# Patient Record
Sex: Male | Born: 1945
Health system: Southern US, Community
[De-identification: ages and names within clinical notes are randomized; demographics above are authoritative.]

## PROBLEM LIST (undated history)

## (undated) DIAGNOSIS — E119 Type 2 diabetes mellitus without complications: Secondary | ICD-10-CM

## (undated) DIAGNOSIS — Z8719 Personal history of other diseases of the digestive system: Secondary | ICD-10-CM

## (undated) DIAGNOSIS — I2510 Atherosclerotic heart disease of native coronary artery without angina pectoris: Secondary | ICD-10-CM

## (undated) DIAGNOSIS — G4700 Insomnia, unspecified: Secondary | ICD-10-CM

## (undated) DIAGNOSIS — F1190 Opioid use, unspecified, uncomplicated: Secondary | ICD-10-CM

## (undated) DIAGNOSIS — J9811 Atelectasis: Secondary | ICD-10-CM

## (undated) DIAGNOSIS — G4730 Sleep apnea, unspecified: Secondary | ICD-10-CM

## (undated) DIAGNOSIS — J449 Chronic obstructive pulmonary disease, unspecified: Secondary | ICD-10-CM

## (undated) DIAGNOSIS — I219 Acute myocardial infarction, unspecified: Secondary | ICD-10-CM

## (undated) DIAGNOSIS — K219 Gastro-esophageal reflux disease without esophagitis: Secondary | ICD-10-CM

## (undated) DIAGNOSIS — I5189 Other ill-defined heart diseases: Secondary | ICD-10-CM

## (undated) DIAGNOSIS — I340 Nonrheumatic mitral (valve) insufficiency: Secondary | ICD-10-CM

## (undated) DIAGNOSIS — E559 Vitamin D deficiency, unspecified: Secondary | ICD-10-CM

## (undated) DIAGNOSIS — Z972 Presence of dental prosthetic device (complete) (partial): Secondary | ICD-10-CM

## (undated) DIAGNOSIS — E876 Hypokalemia: Secondary | ICD-10-CM

## (undated) DIAGNOSIS — M5136 Other intervertebral disc degeneration, lumbar region: Secondary | ICD-10-CM

## (undated) DIAGNOSIS — M1990 Unspecified osteoarthritis, unspecified site: Secondary | ICD-10-CM

## (undated) DIAGNOSIS — I10 Essential (primary) hypertension: Secondary | ICD-10-CM

## (undated) DIAGNOSIS — E781 Pure hyperglyceridemia: Secondary | ICD-10-CM

## (undated) DIAGNOSIS — M51369 Other intervertebral disc degeneration, lumbar region without mention of lumbar back pain or lower extremity pain: Secondary | ICD-10-CM

## (undated) DIAGNOSIS — F419 Anxiety disorder, unspecified: Secondary | ICD-10-CM

## (undated) HISTORY — DX: Personal history of other diseases of the digestive system: Z87.19

## (undated) HISTORY — DX: Vitamin D deficiency, unspecified: E55.9

## (undated) HISTORY — DX: Pure hyperglyceridemia: E78.1

## (undated) HISTORY — DX: Insomnia, unspecified: G47.00

## (undated) HISTORY — DX: Type 2 diabetes mellitus without complications: E11.9

## (undated) HISTORY — DX: Unspecified osteoarthritis, unspecified site: M19.90

## (undated) HISTORY — DX: Opioid use, unspecified, uncomplicated: F11.90

## (undated) HISTORY — DX: Sleep apnea, unspecified: G47.30

## (undated) HISTORY — DX: Nonrheumatic mitral (valve) insufficiency: I34.0

## (undated) HISTORY — PX: HERNIA REPAIR: SHX51

## (undated) HISTORY — DX: Other intervertebral disc degeneration, lumbar region: M51.36

## (undated) HISTORY — DX: Atherosclerotic heart disease of native coronary artery without angina pectoris: I25.10

## (undated) HISTORY — DX: Essential (primary) hypertension: I10

## (undated) HISTORY — DX: Acute myocardial infarction, unspecified: I21.9

## (undated) HISTORY — DX: Anxiety disorder, unspecified: F41.9

## (undated) HISTORY — DX: Atelectasis: J98.11

## (undated) HISTORY — DX: Other ill-defined heart diseases: I51.89

## (undated) HISTORY — DX: Chronic obstructive pulmonary disease, unspecified: J44.9

## (undated) HISTORY — DX: Hypokalemia: E87.6

## (undated) HISTORY — DX: Other intervertebral disc degeneration, lumbar region without mention of lumbar back pain or lower extremity pain: M51.369

## (undated) HISTORY — DX: Gastro-esophageal reflux disease without esophagitis: K21.9

---

## 1995-10-19 HISTORY — PX: CORONARY ARTERY BYPASS GRAFT: SHX141

## 2001-10-18 HISTORY — PX: FOOT SURGERY: SHX648

## 2002-02-28 ENCOUNTER — Encounter: Admission: RE | Admit: 2002-02-28 | Discharge: 2002-02-28 | Payer: Self-pay | Admitting: *Deleted

## 2002-07-25 ENCOUNTER — Encounter: Payer: Self-pay | Admitting: Neurosurgery

## 2002-07-27 ENCOUNTER — Ambulatory Visit (HOSPITAL_COMMUNITY): Admission: RE | Admit: 2002-07-27 | Discharge: 2002-07-27 | Payer: Self-pay | Admitting: Neurosurgery

## 2002-07-27 ENCOUNTER — Encounter: Payer: Self-pay | Admitting: Neurosurgery

## 2005-02-15 ENCOUNTER — Emergency Department: Payer: Self-pay | Admitting: Emergency Medicine

## 2006-11-18 HISTORY — PX: CORONARY STENT PLACEMENT: SHX1402

## 2006-12-09 ENCOUNTER — Other Ambulatory Visit: Payer: Self-pay

## 2006-12-09 ENCOUNTER — Inpatient Hospital Stay: Payer: Self-pay | Admitting: Endocrinology

## 2006-12-10 ENCOUNTER — Other Ambulatory Visit: Payer: Self-pay

## 2011-06-10 DIAGNOSIS — K5792 Diverticulitis of intestine, part unspecified, without perforation or abscess without bleeding: Secondary | ICD-10-CM | POA: Insufficient documentation

## 2011-06-10 DIAGNOSIS — Z86010 Personal history of colonic polyps: Secondary | ICD-10-CM | POA: Insufficient documentation

## 2011-06-10 LAB — HM COLONOSCOPY: HM Colonoscopy: ABNORMAL

## 2012-03-20 ENCOUNTER — Ambulatory Visit: Payer: Self-pay | Admitting: Surgery

## 2012-04-17 HISTORY — PX: UMBILICAL HERNIA REPAIR: SHX196

## 2012-09-26 ENCOUNTER — Ambulatory Visit: Payer: Self-pay | Admitting: Family Medicine

## 2014-05-29 LAB — TSH: TSH: 2.49 u[IU]/mL (ref <=5.90)

## 2014-10-14 LAB — HEMOGLOBIN A1C: HEMOGLOBIN A1C: 5.7 % (ref 4.0–6.0)

## 2014-10-14 LAB — LIPID PANEL
Cholesterol: 126 mg/dL (ref 0–200)
HDL: 28 mg/dL — AB (ref 35–70)
LDL CALC: 53 mg/dL
Triglycerides: 225 mg/dL — AB (ref 40–160)

## 2014-11-12 ENCOUNTER — Ambulatory Visit: Payer: Self-pay | Admitting: Family Medicine

## 2014-11-12 DIAGNOSIS — E119 Type 2 diabetes mellitus without complications: Secondary | ICD-10-CM | POA: Diagnosis not present

## 2014-11-12 DIAGNOSIS — M79671 Pain in right foot: Secondary | ICD-10-CM | POA: Diagnosis not present

## 2014-11-12 DIAGNOSIS — G4700 Insomnia, unspecified: Secondary | ICD-10-CM | POA: Diagnosis not present

## 2014-11-12 DIAGNOSIS — M19071 Primary osteoarthritis, right ankle and foot: Secondary | ICD-10-CM | POA: Diagnosis not present

## 2014-11-12 DIAGNOSIS — R208 Other disturbances of skin sensation: Secondary | ICD-10-CM | POA: Diagnosis not present

## 2014-11-12 DIAGNOSIS — F419 Anxiety disorder, unspecified: Secondary | ICD-10-CM | POA: Diagnosis not present

## 2014-11-12 DIAGNOSIS — M542 Cervicalgia: Secondary | ICD-10-CM | POA: Diagnosis not present

## 2014-11-12 LAB — CBC AND DIFFERENTIAL
HCT: 43 % (ref 41–53)
Hemoglobin: 15 g/dL (ref 13.5–17.5)
Neutrophils Absolute: 4 /uL
Platelets: 179 10*3/uL (ref 150–399)
WBC: 8.7 10^3/mL

## 2014-11-12 LAB — HEPATIC FUNCTION PANEL
ALT: 39 U/L (ref 10–40)
AST: 22 U/L (ref 14–40)
Alkaline Phosphatase: 54 U/L (ref 25–125)
Bilirubin, Total: 0.4 mg/dL

## 2014-11-12 LAB — BASIC METABOLIC PANEL
BUN: 14 mg/dL (ref 4–21)
Creatinine: 1.1 mg/dL (ref 0.6–1.3)
Glucose: 103 mg/dL
Potassium: 4.3 mmol/L (ref 3.4–5.3)
Sodium: 140 mmol/L (ref 137–147)

## 2014-12-12 DIAGNOSIS — M542 Cervicalgia: Secondary | ICD-10-CM | POA: Diagnosis not present

## 2014-12-12 DIAGNOSIS — E784 Other hyperlipidemia: Secondary | ICD-10-CM | POA: Diagnosis not present

## 2014-12-12 DIAGNOSIS — F419 Anxiety disorder, unspecified: Secondary | ICD-10-CM | POA: Diagnosis not present

## 2014-12-12 DIAGNOSIS — I10 Essential (primary) hypertension: Secondary | ICD-10-CM | POA: Diagnosis not present

## 2015-02-26 DIAGNOSIS — H2513 Age-related nuclear cataract, bilateral: Secondary | ICD-10-CM | POA: Diagnosis not present

## 2015-04-01 ENCOUNTER — Other Ambulatory Visit: Payer: Self-pay

## 2015-04-01 MED ORDER — METFORMIN HCL ER 500 MG PO TB24: 500.0000 mg | ORAL_TABLET | Freq: Two times a day (BID) | ORAL | Status: DC

## 2015-04-01 NOTE — Telephone Encounter (Signed)
Requested from patients pharmacy

## 2015-04-11 ENCOUNTER — Ambulatory Visit (INDEPENDENT_AMBULATORY_CARE_PROVIDER_SITE_OTHER): Payer: Medicare Other | Admitting: Family Medicine

## 2015-04-11 ENCOUNTER — Other Ambulatory Visit: Payer: Self-pay | Admitting: Family Medicine

## 2015-04-11 ENCOUNTER — Encounter: Payer: Self-pay | Admitting: Family Medicine

## 2015-04-11 VITALS — BP 118/78 | HR 71 | Temp 98.5°F | Resp 18 | Ht 71.0 in | Wt 218.7 lb

## 2015-04-11 DIAGNOSIS — M5030 Other cervical disc degeneration, unspecified cervical region: Secondary | ICD-10-CM | POA: Insufficient documentation

## 2015-04-11 DIAGNOSIS — M542 Cervicalgia: Secondary | ICD-10-CM

## 2015-04-11 DIAGNOSIS — F419 Anxiety disorder, unspecified: Secondary | ICD-10-CM | POA: Diagnosis not present

## 2015-04-11 DIAGNOSIS — G8929 Other chronic pain: Secondary | ICD-10-CM | POA: Insufficient documentation

## 2015-04-11 DIAGNOSIS — M549 Dorsalgia, unspecified: Secondary | ICD-10-CM | POA: Diagnosis not present

## 2015-04-11 DIAGNOSIS — F1120 Opioid dependence, uncomplicated: Secondary | ICD-10-CM | POA: Insufficient documentation

## 2015-04-11 MED ORDER — CLONAZEPAM 1 MG PO TABS: 1.0000 mg | ORAL_TABLET | Freq: Two times a day (BID) | ORAL | Status: DC | PRN

## 2015-04-11 MED ORDER — OXYCODONE-ACETAMINOPHEN 10-325 MG PO TABS: 1.0000 | ORAL_TABLET | Freq: Four times a day (QID) | ORAL | Status: DC | PRN

## 2015-04-11 NOTE — Progress Notes (Signed)
Name: Juan Le.   MRN: [id number]    DOB: 1946/01/14   Date:04/11/2015       Progress Note  Subjective  Chief Complaint  Chief Complaint  Patient presents with  . Medication Refill  . Hypertension    Neck Pain  This is a chronic problem. Associated with: pt. has histry of degenerative disc disease in his neck. The pain is at a severity of 2/10 (as long as he is taking medication, it is controlled.). The pain is mild. The symptoms are aggravated by position and bending. Pertinent negatives include no headaches, leg pain, numbness or weakness. He has tried oral narcotics for the symptoms. The treatment provided significant relief.  Anxiety Presents for follow-up visit. Symptoms include excessive worry, insomnia and nervous/anxious behavior. Patient reports no feeling of choking, irritability, palpitations or shortness of breath. The severity of symptoms is moderate.   Past treatments include benzodiazephines. The treatment provided significant relief. Compliance with prior treatments has been good.  Back Pain This is a chronic problem. The problem is unchanged. The pain is present in the lumbar spine and thoracic spine. The quality of the pain is described as shooting. The pain does not radiate. The pain is at a severity of 2/10. The pain is moderate (pain is controlled as long as he is on medication). Pertinent negatives include no bladder incontinence, bowel incontinence, headaches, leg pain, numbness or weakness. He has tried analgesics for the symptoms. The treatment provided significant relief.      Past Medical History  Diagnosis Date  . Diabetes   . Hypertriglyceridemia   . Hypokalemia   . Vitamin D deficiency   . Chronic, continuous use of opioids   . Anxiety   . Insomnia   . Hypertension   . GERD (gastroesophageal reflux disease)   . History of diverticulitis   . Mitral regurgitation   . DJD (degenerative joint disease)   . Hypertension     Past Surgical  History  Procedure Laterality Date  . Umbilical hernia repair  04/2012  . Coronary stent placement  11/2006  . Coronary artery bypass graft  1997  . Foot surgery  2003    Family History  Problem Relation Age of Onset  . Throat cancer Father   . Stroke Brother   . Stroke Mother   . Breast cancer Paternal Aunt   . Colon cancer Father   . Kidney disease Mother   . Heart disease Mother     History   Social History  . Marital Status: Married    Spouse Name: N/A  . Number of Children: 0  . Years of Education: N/A   Occupational History  . Not on file.   Social History Main Topics  . Smoking status: Former Research scientist (life sciences)  . Smokeless tobacco: Not on file  . Alcohol Use: No  . Drug Use: No  . Sexual Activity:    Partners: Female   Other Topics Concern  . Not on file   Social History Narrative     Current outpatient prescriptions:  .  aspirin 81 MG tablet, Take 1 tablet by mouth daily., Disp: , Rfl:  .  atorvastatin (LIPITOR) 40 MG tablet, Take 1 tablet by mouth daily., Disp: , Rfl:  .  clonazePAM (KLONOPIN) 1 MG tablet, Take 1 tablet by mouth 2 (two) times daily., Disp: , Rfl:  .  cloNIDine (CATAPRES) 0.1 MG tablet, Take 1 tablet by mouth 2 (two) times daily., Disp: , Rfl:  .  metFORMIN (GLUCOPHAGE-XR) 500 MG 24 hr tablet, Take 1 tablet (500 mg total) by mouth 2 (two) times daily., Disp: 60 tablet, Rfl: 2 .  omeprazole (PRILOSEC) 20 MG capsule, Take 1 capsule by mouth daily., Disp: , Rfl:  .  oxyCODONE-acetaminophen (PERCOCET) 10-325 MG per tablet, Take 1 tablet by mouth every 6 (six) hours as needed., Disp: , Rfl:  .  telmisartan (MICARDIS) 40 MG tablet, Take 1 tablet by mouth daily., Disp: , Rfl:  .  zolpidem (AMBIEN) 10 MG tablet, Take 1 tablet by mouth at bedtime., Disp: , Rfl:  .  ergocalciferol (VITAMIN D2) 50000 UNITS capsule, Take 1 capsule by mouth once a week., Disp: , Rfl:   Allergies  Allergen Reactions  . Ace Inhibitors Cough  . Diphenhydramine     stomach  upset     Review of Systems  Constitutional: Negative for irritability.  Respiratory: Negative for shortness of breath.   Cardiovascular: Negative for palpitations.  Gastrointestinal: Negative for bowel incontinence.  Genitourinary: Negative for bladder incontinence.  Musculoskeletal: Positive for back pain and neck pain.  Neurological: Negative for weakness, numbness and headaches.  Psychiatric/Behavioral: The patient is nervous/anxious and has insomnia.       Objective  Filed Vitals:   04/11/15 1421  BP: 118/78  Pulse: 71  Temp: 98.5 F (36.9 C)  TempSrc: Oral  Resp: 18  Height: 5\' 11"  (1.803 m)  Weight: 218 lb 11.2 oz (99.202 kg)  SpO2: 97%    Physical Exam  Constitutional: He is oriented to person, place, and time and well-developed, well-nourished, and in no distress.  HENT:  Head: Normocephalic and atraumatic.  Cardiovascular: Normal rate and regular rhythm.   Pulmonary/Chest: Effort normal and breath sounds normal.  Musculoskeletal:       Cervical back: He exhibits tenderness, pain and spasm. He exhibits no swelling.       Lumbar back: He exhibits tenderness.       Back:  Neurological: He is alert and oriented to person, place, and time.  Skin: Skin is warm and dry.  Psychiatric: Affect normal.  Nursing note and vitals reviewed.      No results found for this or any previous visit (from the past 2160 hour(s)).   Assessment & Plan 1. Anxiety Symptoms controlled on present therapy. Patient compliant with controlled substances agreement. - clonazePAM (KLONOPIN) 1 MG tablet; Take 1 tablet (1 mg total) by mouth 2 (two) times daily as needed for anxiety.  Dispense: 60 tablet; Refill: 0  2. Chronic neck and back pain Patient has history of degenerative disc disease of cervical spine. He is on oral opioid therapy, which helps relieve his pain. He is compliant with the controlled substances agreement. Refills provided and follow-up in one month. -  oxyCODONE-acetaminophen (PERCOCET) 10-325 MG per tablet; Take 1 tablet by mouth every 6 (six) hours as needed. Pt. Takes 5 tabs/ day  Dispense: 155 tablet; Refill: 0  There are no diagnoses linked to this encounter.  Lance Galas [facility] 04/11/2015 2:36 PM

## 2015-04-11 NOTE — Telephone Encounter (Signed)
Patient walked in this morning wanting refills on Oxycodone-Acet 10-325,Omeprazole 20 mg, Metformin HCL ER 500 mg, Zolpidem Tartrate 10 mg, Clonazepam 1 mg to be sent to [facility].

## 2015-04-16 ENCOUNTER — Other Ambulatory Visit: Payer: Self-pay | Admitting: Family Medicine

## 2015-04-16 ENCOUNTER — Encounter: Payer: Self-pay | Admitting: Family Medicine

## 2015-04-16 ENCOUNTER — Ambulatory Visit (INDEPENDENT_AMBULATORY_CARE_PROVIDER_SITE_OTHER): Payer: Medicare Other | Admitting: Family Medicine

## 2015-04-16 ENCOUNTER — Telehealth: Payer: Self-pay | Admitting: Family Medicine

## 2015-04-16 ENCOUNTER — Ambulatory Visit: Payer: Self-pay | Admitting: Family Medicine

## 2015-04-16 VITALS — BP 124/66 | HR 67 | Temp 98.3°F | Ht 70.0 in | Wt 222.1 lb

## 2015-04-16 DIAGNOSIS — E1165 Type 2 diabetes mellitus with hyperglycemia: Secondary | ICD-10-CM

## 2015-04-16 DIAGNOSIS — Z951 Presence of aortocoronary bypass graft: Secondary | ICD-10-CM | POA: Insufficient documentation

## 2015-04-16 DIAGNOSIS — I10 Essential (primary) hypertension: Secondary | ICD-10-CM | POA: Diagnosis not present

## 2015-04-16 DIAGNOSIS — E119 Type 2 diabetes mellitus without complications: Secondary | ICD-10-CM | POA: Diagnosis not present

## 2015-04-16 DIAGNOSIS — E785 Hyperlipidemia, unspecified: Secondary | ICD-10-CM | POA: Insufficient documentation

## 2015-04-16 DIAGNOSIS — E1169 Type 2 diabetes mellitus with other specified complication: Secondary | ICD-10-CM | POA: Insufficient documentation

## 2015-04-16 DIAGNOSIS — K219 Gastro-esophageal reflux disease without esophagitis: Secondary | ICD-10-CM | POA: Diagnosis not present

## 2015-04-16 DIAGNOSIS — E1142 Type 2 diabetes mellitus with diabetic polyneuropathy: Secondary | ICD-10-CM | POA: Insufficient documentation

## 2015-04-16 DIAGNOSIS — G4700 Insomnia, unspecified: Secondary | ICD-10-CM | POA: Insufficient documentation

## 2015-04-16 DIAGNOSIS — Z79891 Long term (current) use of opiate analgesic: Secondary | ICD-10-CM | POA: Insufficient documentation

## 2015-04-16 MED ORDER — GLUCOSE BLOOD VI STRP: ORAL_STRIP | Status: DC

## 2015-04-16 MED ORDER — OMEPRAZOLE 40 MG PO CPDR: 40.0000 mg | DELAYED_RELEASE_CAPSULE | Freq: Every day | ORAL | Status: DC

## 2015-04-16 MED ORDER — METFORMIN HCL ER 500 MG PO TB24: 500.0000 mg | ORAL_TABLET | Freq: Two times a day (BID) | ORAL | Status: DC

## 2015-04-16 NOTE — Progress Notes (Signed)
Name: Juan Le.   MRN: [id number]    DOB: Feb 24, 1946   Date:04/16/2015       Progress Note  Subjective  Chief Complaint  Chief Complaint  Patient presents with  . Follow-up    bloodwork and refills    Hypertension This is a chronic problem. The problem is controlled. Pertinent negatives include no chest pain, headaches, palpitations or shortness of breath. Past treatments include angiotensin blockers and central alpha agonists. There is no history of kidney disease, CAD/MI or CVA.  Hyperlipidemia This is a chronic problem. Recent lipid tests were reviewed and are high. Exacerbating diseases include obesity. Pertinent negatives include no chest pain, leg pain, myalgias or shortness of breath. Current antihyperlipidemic treatment includes statins. There are no compliance problems.   Diabetes He presents for his follow-up diabetic visit. He has type 2 diabetes mellitus. His disease course has been stable. There are no hypoglycemic associated symptoms. Pertinent negatives for hypoglycemia include no headaches. Pertinent negatives for diabetes include no chest pain, no polydipsia, no polyuria, no visual change and no weakness. Pertinent negatives for diabetic complications include no CVA. Current diabetic treatment includes oral agent (monotherapy). His weight is stable. He is following a diabetic and generally healthy diet. He rarely participates in exercise. His lunch blood glucose is taken between 1-2 pm. His lunch blood glucose range is generally 110-130 mg/dl. An ACE inhibitor/angiotensin II receptor blocker is being taken. Eye exam is current.  Heartburn He complains of heartburn. He reports no chest pain, no dysphagia, no nausea or no sore throat. This is a chronic problem. The problem has been gradually worsening (he has had episodes of reflux/burning sensation in his chest). The symptoms are aggravated by caffeine. Pertinent negatives include no anemia or melena. He has tried a PPI  for the symptoms. The treatment provided moderate relief. Past procedures do not include an EGD.  patient is requesting if his dosage of omeprazole can be increased for better control of symptoms.    Past Medical History  Diagnosis Date  . Diabetes   . Hypertriglyceridemia   . Hypokalemia   . Vitamin D deficiency   . Chronic, continuous use of opioids   . Anxiety   . Insomnia   . Hypertension   . GERD (gastroesophageal reflux disease)   . History of diverticulitis   . Mitral regurgitation   . DJD (degenerative joint disease)   . Hypertension     Past Surgical History  Procedure Laterality Date  . Umbilical hernia repair  04/2012  . Coronary stent placement  11/2006  . Coronary artery bypass graft  1997  . Foot surgery  2003    Family History  Problem Relation Age of Onset  . Throat cancer Father   . Stroke Brother   . Stroke Mother   . Breast cancer Paternal Aunt   . Colon cancer Father   . Kidney disease Mother   . Heart disease Mother     History   Social History  . Marital Status: Married    Spouse Name: N/A  . Number of Children: 0  . Years of Education: N/A   Occupational History  . Not on file.   Social History Main Topics  . Smoking status: Former Research scientist (life sciences)  . Smokeless tobacco: Not on file  . Alcohol Use: No  . Drug Use: No  . Sexual Activity:    Partners: Female   Other Topics Concern  . Not on file   Social History Narrative  Current outpatient prescriptions:  .  aspirin 81 MG tablet, Take 1 tablet by mouth daily., Disp: , Rfl:  .  clonazePAM (KLONOPIN) 1 MG tablet, Take 1 tablet (1 mg total) by mouth 2 (two) times daily as needed for anxiety., Disp: 60 tablet, Rfl: 0 .  cloNIDine (CATAPRES) 0.1 MG tablet, Take 1 tablet by mouth 2 (two) times daily., Disp: , Rfl:  .  metFORMIN (GLUCOPHAGE-XR) 500 MG 24 hr tablet, Take 1 tablet (500 mg total) by mouth 2 (two) times daily., Disp: 60 tablet, Rfl: 2 .  omeprazole (PRILOSEC) 20 MG capsule,  Take 1 capsule by mouth daily., Disp: , Rfl:  .  oxyCODONE-acetaminophen (PERCOCET) 10-325 MG per tablet, Take 1 tablet by mouth every 6 (six) hours as needed. Pt. Takes 5 tabs/ day, Disp: 155 tablet, Rfl: 0 .  telmisartan (MICARDIS) 40 MG tablet, Take 1 tablet by mouth daily., Disp: , Rfl:  .  zolpidem (AMBIEN) 10 MG tablet, Take 1 tablet by mouth at bedtime., Disp: , Rfl:  .  atorvastatin (LIPITOR) 40 MG tablet, Take 1 tablet by mouth daily., Disp: , Rfl:  .  ergocalciferol (VITAMIN D2) 50000 UNITS capsule, Take 1 capsule by mouth once a week., Disp: , Rfl:   Allergies  Allergen Reactions  . Ace Inhibitors Cough  . Diphenhydramine     stomach upset     Review of Systems  HENT: Negative for sore throat.   Respiratory: Negative for shortness of breath.   Cardiovascular: Negative for chest pain and palpitations.  Gastrointestinal: Positive for heartburn. Negative for dysphagia, nausea and melena.  Musculoskeletal: Negative for myalgias.  Neurological: Negative for weakness and headaches.  Endo/Heme/Allergies: Negative for polydipsia.      Objective  Filed Vitals:   04/16/15 0934  BP: 124/66  Pulse: 67  Temp: 98.3 F (36.8 C)  TempSrc: Oral  Height: 5\' 10"  (1.778 m)  Weight: 222 lb 1.6 oz (100.744 kg)  SpO2: 97%    Physical Exam  Constitutional: He is oriented to person, place, and time and well-developed, well-nourished, and in no distress.  HENT:  Head: Normocephalic and atraumatic.  Eyes: Pupils are equal, round, and reactive to light.  Cardiovascular: Normal rate, regular rhythm and intact distal pulses.   Pulmonary/Chest: Effort normal and breath sounds normal.  Abdominal: Soft. Bowel sounds are normal.  Musculoskeletal: He exhibits no edema.  Neurological: He is alert and oriented to person, place, and time.  Skin: Skin is warm and dry.  Nursing note and vitals reviewed.      No results found for this or any previous visit (from the past 2160  hour(s)).   Assessment & Plan 1. Essential hypertension Blood pressure is stable and controlled on current therapy. Follow-up in 3 months.  2. Gastroesophageal reflux disease, esophagitis presence not specified We will increase omeprazole to 40 mg daily for 4 weeks and reassess. If his symptoms of heartburn are not controlled with higher dosage, he will need a referral to GI for endoscopy - omeprazole (PRILOSEC) 40 MG capsule; Take 1 capsule (40 mg total) by mouth daily.  Dispense: 30 capsule; Refill: 0  3. Type 2 diabetes mellitus without complication  - metFORMIN (GLUCOPHAGE-XR) 500 MG 24 hr tablet; Take 1 tablet (500 mg total) by mouth 2 (two) times daily.  Dispense: 180 tablet; Refill: 0 - HgB A1c  4. Dyslipidemia  - Lipid panel  There are no diagnoses linked to this encounter.  Calum Cormier [facility] 04/16/2015 9:45 AM

## 2015-04-16 NOTE — Telephone Encounter (Signed)
Requesting refill on One Touch Ultra Test Strip #50 (code #: 25, lot #: [id number]) please send to [facility].

## 2015-04-17 LAB — LIPID PANEL
Chol/HDL Ratio: 5.2 ratio units — ABNORMAL HIGH (ref 0.0–5.0)
Cholesterol, Total: 152 mg/dL (ref 100–199)
HDL: 29 mg/dL — AB (ref >39)
Triglycerides: 416 mg/dL — ABNORMAL HIGH (ref 0–149)

## 2015-04-17 LAB — HEMOGLOBIN A1C
Est. average glucose Bld gHb Est-mCnc: 126 mg/dL
Hgb A1c MFr Bld: 6 % — ABNORMAL HIGH (ref 4.8–5.6)

## 2015-04-18 NOTE — Telephone Encounter (Signed)
Done 04-16-15

## 2015-05-09 ENCOUNTER — Ambulatory Visit: Payer: Medicare Other | Admitting: Family Medicine

## 2015-05-12 ENCOUNTER — Encounter: Payer: Self-pay | Admitting: Family Medicine

## 2015-05-12 ENCOUNTER — Ambulatory Visit (INDEPENDENT_AMBULATORY_CARE_PROVIDER_SITE_OTHER): Payer: Medicare Other | Admitting: Family Medicine

## 2015-05-12 VITALS — BP 126/70 | HR 70 | Temp 98.0°F | Resp 17 | Ht 70.0 in | Wt 221.1 lb

## 2015-05-12 DIAGNOSIS — G4700 Insomnia, unspecified: Secondary | ICD-10-CM | POA: Diagnosis not present

## 2015-05-12 DIAGNOSIS — M549 Dorsalgia, unspecified: Secondary | ICD-10-CM | POA: Diagnosis not present

## 2015-05-12 DIAGNOSIS — G8929 Other chronic pain: Principal | ICD-10-CM

## 2015-05-12 DIAGNOSIS — G4762 Sleep related leg cramps: Secondary | ICD-10-CM | POA: Diagnosis not present

## 2015-05-12 DIAGNOSIS — M542 Cervicalgia: Secondary | ICD-10-CM | POA: Diagnosis not present

## 2015-05-12 DIAGNOSIS — F419 Anxiety disorder, unspecified: Secondary | ICD-10-CM | POA: Diagnosis not present

## 2015-05-12 MED ORDER — CLONAZEPAM 1 MG PO TABS: 1.0000 mg | ORAL_TABLET | Freq: Two times a day (BID) | ORAL | Status: DC | PRN

## 2015-05-12 MED ORDER — OXYCODONE-ACETAMINOPHEN 10-325 MG PO TABS: 1.0000 | ORAL_TABLET | Freq: Four times a day (QID) | ORAL | Status: DC | PRN

## 2015-05-12 MED ORDER — ZOLPIDEM TARTRATE 10 MG PO TABS: 10.0000 mg | ORAL_TABLET | Freq: Every day | ORAL | Status: DC

## 2015-05-12 NOTE — Progress Notes (Signed)
Name: Juan Le.   MRN: [id number]    DOB: January 27, 1946   Date:05/12/2015       Progress Note  Subjective  Chief Complaint  Chief Complaint  Patient presents with  . Medication Refill  . Diabetes  . Hyperlipidemia  . Leg Pain    Cramps in Right Leg    Neck Pain  This is a chronic problem. Associated with: pt. has histry of degenerative disc disease in his neck. The pain is at a severity of 2/10 (as long as he is taking medication, it is controlled.). The pain is mild. The symptoms are aggravated by position and bending. Pertinent negatives include no headaches, leg pain (has been having leg cramps in his right leg for the past few nights.), numbness or weakness. He has tried oral narcotics for the symptoms. The treatment provided significant relief.  Anxiety Presents for follow-up visit. Symptoms include excessive worry, insomnia and nervous/anxious behavior. Patient reports no irritability, palpitations or shortness of breath. The severity of symptoms is moderate.   Past treatments include benzodiazephines. The treatment provided significant relief. Compliance with prior treatments has been good.  Back Pain This is a chronic problem. The problem is unchanged. The pain is present in the lumbar spine and thoracic spine. The quality of the pain is described as shooting. The pain does not radiate. The pain is at a severity of 2/10. The pain is moderate (pain is controlled as long as he is on medication). Pertinent negatives include no bladder incontinence, bowel incontinence, headaches, leg pain (has been having leg cramps in his right leg for the past few nights.), numbness or weakness. He has tried analgesics for the symptoms. The treatment provided significant relief.   Leg Cramps Pt. Has leg cramps that have been waking him up at night. This happened twice last night and three times the night before. He believes he is not drinking enough water and that's what's causing his leg  cramps.    Past Medical History  Diagnosis Date  . Diabetes   . Hypertriglyceridemia   . Hypokalemia   . Vitamin D deficiency   . Chronic, continuous use of opioids   . Anxiety   . Insomnia   . Hypertension   . GERD (gastroesophageal reflux disease)   . History of diverticulitis   . Mitral regurgitation   . DJD (degenerative joint disease)   . Hypertension     Past Surgical History  Procedure Laterality Date  . Umbilical hernia repair  04/2012  . Coronary stent placement  11/2006  . Coronary artery bypass graft  1997  . Foot surgery  2003    Family History  Problem Relation Age of Onset  . Throat cancer Father   . Stroke Brother   . Stroke Mother   . Breast cancer Paternal Aunt   . Colon cancer Father   . Kidney disease Mother   . Heart disease Mother     History   Social History  . Marital Status: Married    Spouse Name: N/A  . Number of Children: 0  . Years of Education: N/A   Occupational History  . Not on file.   Social History Main Topics  . Smoking status: Former Research scientist (life sciences)  . Smokeless tobacco: Not on file  . Alcohol Use: No  . Drug Use: No  . Sexual Activity:    Partners: Female   Other Topics Concern  . Not on file   Social History Narrative  Current outpatient prescriptions:  .  aspirin 81 MG tablet, Take 1 tablet by mouth daily., Disp: , Rfl:  .  atorvastatin (LIPITOR) 40 MG tablet, Take 1 tablet by mouth daily., Disp: , Rfl:  .  clonazePAM (KLONOPIN) 1 MG tablet, Take 1 tablet (1 mg total) by mouth 2 (two) times daily as needed for anxiety., Disp: 60 tablet, Rfl: 0 .  cloNIDine (CATAPRES) 0.1 MG tablet, Take 1 tablet by mouth 2 (two) times daily., Disp: , Rfl:  .  ergocalciferol (VITAMIN D2) 50000 UNITS capsule, Take 1 capsule by mouth once a week., Disp: , Rfl:  .  glucose blood test strip, Use as instructed, Disp: 50 each, Rfl: 12 .  metFORMIN (GLUCOPHAGE-XR) 500 MG 24 hr tablet, Take 1 tablet (500 mg total) by mouth 2 (two) times  daily., Disp: 180 tablet, Rfl: 0 .  omeprazole (PRILOSEC) 40 MG capsule, Take 1 capsule (40 mg total) by mouth daily., Disp: 30 capsule, Rfl: 0 .  oxyCODONE-acetaminophen (PERCOCET) 10-325 MG per tablet, Take 1 tablet by mouth every 6 (six) hours as needed. Pt. Takes 5 tabs/ day, Disp: 155 tablet, Rfl: 0 .  telmisartan (MICARDIS) 40 MG tablet, Take 1 tablet by mouth daily., Disp: , Rfl:  .  zolpidem (AMBIEN) 10 MG tablet, Take 1 tablet by mouth at bedtime., Disp: , Rfl:   Allergies  Allergen Reactions  . Ace Inhibitors Cough  . Diphenhydramine     stomach upset     Review of Systems  Constitutional: Negative for irritability.  Respiratory: Negative for shortness of breath.   Cardiovascular: Negative for palpitations.  Gastrointestinal: Negative for bowel incontinence.  Genitourinary: Negative for bladder incontinence.  Musculoskeletal: Positive for back pain and neck pain.  Neurological: Negative for weakness, numbness and headaches.  Psychiatric/Behavioral: The patient is nervous/anxious and has insomnia.       Objective  Filed Vitals:   05/12/15 0841  BP: 126/70  Pulse: 70  Temp: 98 F (36.7 C)  TempSrc: Oral  Resp: 17  Height: 5\' 10"  (1.778 m)  Weight: 221 lb 1.6 oz (100.29 kg)  SpO2: 97%    Physical Exam  Constitutional: He is oriented to person, place, and time and well-developed, well-nourished, and in no distress.  HENT:  Head: Normocephalic and atraumatic.  Cardiovascular: Normal rate and regular rhythm.   Pulmonary/Chest: Effort normal and breath sounds normal.  Musculoskeletal:       Cervical back: He exhibits tenderness, pain and spasm. He exhibits no swelling.       Lumbar back: He exhibits tenderness.       Back:  Neurological: He is alert and oriented to person, place, and time.  Skin: Skin is warm and dry.  Psychiatric: Affect normal.  Nursing note and vitals reviewed.      Recent Results (from the past 2160 hour(s))  HgB A1c     Status:  Abnormal   Collection Time: 04/16/15 10:14 AM  Result Value Ref Range   Hgb A1c MFr Bld 6.0 (H) 4.8 - 5.6 %    Comment:          Pre-diabetes: 5.7 - 6.4          Diabetes: >6.4          Glycemic control for adults with diabetes: <7.0    Est. average glucose Bld gHb Est-mCnc 126 mg/dL  Lipid panel     Status: Abnormal   Collection Time: 04/16/15 10:14 AM  Result Value Ref Range   Cholesterol, Total  152 100 - 199 mg/dL   Triglycerides 416 (H) 0 - 149 mg/dL   HDL 29 (L) >39 mg/dL    Comment: According to ATP-III Guidelines, HDL-C >59 mg/dL is considered a negative risk factor for CHD.    VLDL Cholesterol Cal Comment 5 - 40 mg/dL    Comment: The calculation for the VLDL cholesterol is not valid when triglyceride level is >400 mg/dL.    LDL Calculated Comment 0 - 99 mg/dL    Comment: Triglyceride result indicated is too high for an accurate LDL cholesterol estimation.    Chol/HDL Ratio 5.2 (H) 0.0 - 5.0 ratio units    Comment:                                   T. Chol/HDL Ratio                                             Men  Women                               1/2 Avg.Risk  3.4    3.3                                   Avg.Risk  5.0    4.4                                2X Avg.Risk  9.6    7.1                                3X Avg.Risk 23.4   11.0      Assessment & Plan 1. Chronic neck and back pain Patient has chronic cervical spine and back pain. He has history of degenerative disc disease. He is on chronic opioid therapy and is compliant with the controlled substances agreement. Refills provided and follow-up in one month. - oxyCODONE-acetaminophen (PERCOCET) 10-325 MG per tablet; Take 1 tablet by mouth every 6 (six) hours as needed. Pt. Takes 5 tabs/ day  Dispense: 155 tablet; Refill: 0  2. Cannot sleep Symptoms of insomnia responsive to Ambien 10 mg at bedtime.  - zolpidem (AMBIEN) 10 MG tablet; Take 1 tablet (10 mg total) by mouth at bedtime.  Dispense: 30 tablet; Refill:  0  3. Anxiety Symptoms of anxiety are stable and controlled on clonazepam 1 mg twice a day when necessary. Patient is aware of the dependence potential of benzodiazepines. Refills provided and follow-up in one month - clonazePAM (KLONOPIN) 1 MG tablet; Take 1 tablet (1 mg total) by mouth 2 (two) times daily as needed for anxiety.  Dispense: 60 tablet; Refill: 0  4. Nocturnal leg cramps Neck cramps most likely from dehydration. Recommended increased fluid intake. Follow-up if no clinical improvement.   Lavora Brisbon [facility] 05/12/2015 8:51 AM

## 2015-05-15 ENCOUNTER — Telehealth: Payer: Self-pay | Admitting: Family Medicine

## 2015-05-15 DIAGNOSIS — K219 Gastro-esophageal reflux disease without esophagitis: Secondary | ICD-10-CM

## 2015-05-15 MED ORDER — OMEPRAZOLE 40 MG PO CPDR: 40.0000 mg | DELAYED_RELEASE_CAPSULE | Freq: Every day | ORAL | Status: DC

## 2015-05-15 NOTE — Telephone Encounter (Signed)
Medication has been refilled and sent to [facility] 

## 2015-06-11 ENCOUNTER — Other Ambulatory Visit: Payer: Self-pay | Admitting: Family Medicine

## 2015-06-11 DIAGNOSIS — F419 Anxiety disorder, unspecified: Secondary | ICD-10-CM

## 2015-06-11 NOTE — Telephone Encounter (Signed)
Routed to Dr. Shah for approval 

## 2015-06-12 ENCOUNTER — Encounter: Payer: Self-pay | Admitting: Family Medicine

## 2015-06-12 ENCOUNTER — Ambulatory Visit (INDEPENDENT_AMBULATORY_CARE_PROVIDER_SITE_OTHER): Payer: Medicare Other | Admitting: Family Medicine

## 2015-06-12 VITALS — BP 122/71 | HR 68 | Temp 98.3°F | Resp 18 | Ht 70.0 in | Wt 220.6 lb

## 2015-06-12 DIAGNOSIS — I10 Essential (primary) hypertension: Secondary | ICD-10-CM

## 2015-06-12 DIAGNOSIS — E785 Hyperlipidemia, unspecified: Secondary | ICD-10-CM

## 2015-06-12 DIAGNOSIS — G4700 Insomnia, unspecified: Secondary | ICD-10-CM

## 2015-06-12 DIAGNOSIS — E1142 Type 2 diabetes mellitus with diabetic polyneuropathy: Secondary | ICD-10-CM | POA: Insufficient documentation

## 2015-06-12 DIAGNOSIS — E781 Pure hyperglyceridemia: Secondary | ICD-10-CM | POA: Insufficient documentation

## 2015-06-12 DIAGNOSIS — R200 Anesthesia of skin: Secondary | ICD-10-CM

## 2015-06-12 DIAGNOSIS — M542 Cervicalgia: Secondary | ICD-10-CM | POA: Diagnosis not present

## 2015-06-12 DIAGNOSIS — F419 Anxiety disorder, unspecified: Secondary | ICD-10-CM

## 2015-06-12 DIAGNOSIS — E1159 Type 2 diabetes mellitus with other circulatory complications: Secondary | ICD-10-CM | POA: Insufficient documentation

## 2015-06-12 DIAGNOSIS — K219 Gastro-esophageal reflux disease without esophagitis: Secondary | ICD-10-CM

## 2015-06-12 DIAGNOSIS — M549 Dorsalgia, unspecified: Secondary | ICD-10-CM | POA: Diagnosis not present

## 2015-06-12 DIAGNOSIS — G8929 Other chronic pain: Secondary | ICD-10-CM

## 2015-06-12 DIAGNOSIS — G629 Polyneuropathy, unspecified: Secondary | ICD-10-CM | POA: Insufficient documentation

## 2015-06-12 MED ORDER — CLONAZEPAM 1 MG PO TABS: 1.0000 mg | ORAL_TABLET | Freq: Two times a day (BID) | ORAL | Status: DC | PRN

## 2015-06-12 MED ORDER — OXYCODONE-ACETAMINOPHEN 10-325 MG PO TABS: 1.0000 | ORAL_TABLET | Freq: Four times a day (QID) | ORAL | Status: DC | PRN

## 2015-06-12 MED ORDER — OMEPRAZOLE 40 MG PO CPDR: 40.0000 mg | DELAYED_RELEASE_CAPSULE | Freq: Every day | ORAL | Status: DC

## 2015-06-12 MED ORDER — TELMISARTAN 40 MG PO TABS: 40.0000 mg | ORAL_TABLET | Freq: Every day | ORAL | Status: DC

## 2015-06-12 MED ORDER — ZOLPIDEM TARTRATE 10 MG PO TABS: 10.0000 mg | ORAL_TABLET | Freq: Every day | ORAL | Status: DC

## 2015-06-12 MED ORDER — ATORVASTATIN CALCIUM 40 MG PO TABS: 40.0000 mg | ORAL_TABLET | Freq: Every day | ORAL | Status: DC

## 2015-06-12 MED ORDER — CLONIDINE HCL 0.1 MG PO TABS: 0.1000 mg | ORAL_TABLET | Freq: Two times a day (BID) | ORAL | Status: DC

## 2015-06-12 NOTE — Telephone Encounter (Signed)
Medication to be renewed is not listed in the message

## 2015-06-12 NOTE — Progress Notes (Signed)
Name: Juan Le.   MRN: [id number]    DOB: 04-06-1946   Date:06/12/2015       Progress Note  Subjective  Chief Complaint  Chief Complaint  Patient presents with  . Follow-up    4 wk  . Medication Refill    clonidine .1mg  / clonazepam 1 mg / telmisartan 40 mg / oxycodone 10-325 / atorvastatin 40 mg / zolpidem 10 mg / omperazole 40 mg   . Diabetes  . Hyperlipidemia    Hyperlipidemia This is a chronic problem. The problem is uncontrolled (elevated TG). Pertinent negatives include no chest pain, myalgias or shortness of breath. Current antihyperlipidemic treatment includes statins. Risk factors for coronary artery disease include diabetes mellitus, dyslipidemia and male sex.  Anxiety Presents for follow-up visit. Symptoms include insomnia and nervous/anxious behavior. Patient reports no chest pain, excessive worry, nausea, palpitations, panic or shortness of breath.   Past treatments include benzodiazephines. The treatment provided significant relief.  Insomnia Primary symptoms: no difficulty falling asleep, frequent awakening.  The symptoms are aggravated by anxiety and pain. Past treatments include medication. Typical bedtime:  10-11 P.M..  PMH includes: hypertension, chronic pain.  Hypertension This is a chronic problem. The problem is controlled. Associated symptoms include anxiety and neck pain. Pertinent negatives include no chest pain, headaches, palpitations or shortness of breath. Past treatments include angiotensin blockers and central alpha agonists. Hypertensive end-organ damage includes CAD/MI. There is no history of kidney disease or CVA.  Neck Pain  This is a chronic problem. The problem has been unchanged. The pain is present in the left side. The quality of the pain is described as shooting. The pain is at a severity of 2/10. Pertinent negatives include no chest pain or headaches. He has tried oral narcotics for the symptoms.  Gastrophageal Reflux He complains of  heartburn. He reports no chest pain, no coughing, no dysphagia, no nausea or no sore throat. This is a chronic problem. He has tried a PPI for the symptoms. The treatment provided significant relief.     Past Medical History  Diagnosis Date  . Diabetes   . Hypertriglyceridemia   . Hypokalemia   . Vitamin D deficiency   . Chronic, continuous use of opioids   . Anxiety   . Insomnia   . Hypertension   . GERD (gastroesophageal reflux disease)   . History of diverticulitis   . Mitral regurgitation   . DJD (degenerative joint disease)   . Hypertension     Past Surgical History  Procedure Laterality Date  . Umbilical hernia repair  04/2012  . Coronary stent placement  11/2006  . Coronary artery bypass graft  1997  . Foot surgery  2003    Family History  Problem Relation Age of Onset  . Throat cancer Father   . Stroke Brother   . Stroke Mother   . Breast cancer Paternal Aunt   . Colon cancer Father   . Kidney disease Mother   . Heart disease Mother     Social History   Social History  . Marital Status: Married    Spouse Name: N/A  . Number of Children: 0  . Years of Education: N/A   Occupational History  . Not on file.   Social History Main Topics  . Smoking status: Former Research scientist (life sciences)  . Smokeless tobacco: Not on file  . Alcohol Use: No  . Drug Use: No  . Sexual Activity:    Partners: Female   Other Topics Concern  .  Not on file   Social History Narrative     Current outpatient prescriptions:  .  aspirin 81 MG tablet, Take 1 tablet by mouth daily., Disp: , Rfl:  .  atorvastatin (LIPITOR) 40 MG tablet, Take 1 tablet by mouth daily., Disp: , Rfl:  .  clonazePAM (KLONOPIN) 1 MG tablet, Take 1 tablet (1 mg total) by mouth 2 (two) times daily as needed for anxiety., Disp: 60 tablet, Rfl: 0 .  cloNIDine (CATAPRES) 0.1 MG tablet, Take 1 tablet by mouth 2 (two) times daily., Disp: , Rfl:  .  ergocalciferol (VITAMIN D2) 50000 UNITS capsule, Take 1 capsule by mouth  once a week., Disp: , Rfl:  .  glucose blood test strip, Use as instructed, Disp: 50 each, Rfl: 12 .  metFORMIN (GLUCOPHAGE-XR) 500 MG 24 hr tablet, Take 1 tablet (500 mg total) by mouth 2 (two) times daily., Disp: 180 tablet, Rfl: 0 .  omeprazole (PRILOSEC) 40 MG capsule, Take 1 capsule (40 mg total) by mouth daily., Disp: 30 capsule, Rfl: 0 .  oxyCODONE-acetaminophen (PERCOCET) 10-325 MG per tablet, Take 1 tablet by mouth every 6 (six) hours as needed. Pt. Takes 5 tabs/ day, Disp: 155 tablet, Rfl: 0 .  telmisartan (MICARDIS) 40 MG tablet, Take 1 tablet by mouth daily., Disp: , Rfl:  .  zolpidem (AMBIEN) 10 MG tablet, Take 1 tablet (10 mg total) by mouth at bedtime., Disp: 30 tablet, Rfl: 0  Allergies  Allergen Reactions  . Ace Inhibitors Cough  . Diphenhydramine     stomach upset     Review of Systems  HENT: Negative for sore throat.   Respiratory: Negative for cough and shortness of breath.   Cardiovascular: Negative for chest pain and palpitations.  Gastrointestinal: Positive for heartburn. Negative for dysphagia, nausea and vomiting.  Musculoskeletal: Positive for back pain and neck pain. Negative for myalgias.  Neurological: Negative for headaches.  Psychiatric/Behavioral: The patient is nervous/anxious and has insomnia.     Objective  Filed Vitals:   06/12/15 0804  BP: 122/71  Pulse: 68  Temp: 98.3 F (36.8 C)  TempSrc: Oral  Resp: 18  Height: 5\' 10"  (1.778 m)  Weight: 220 lb 9.6 oz (100.064 kg)  SpO2: 97%    Physical Exam  Constitutional: He is oriented to person, place, and time and well-developed, well-nourished, and in no distress.  Cardiovascular: Normal rate and regular rhythm.   Pulmonary/Chest: Effort normal and breath sounds normal.  Abdominal: Soft. There is no tenderness.  Musculoskeletal:       Cervical back: He exhibits tenderness and pain. He exhibits no spasm.  Neurological: He is alert and oriented to person, place, and time.  Skin: Skin is warm  and dry.  Psychiatric: Mood, memory and affect normal.  Nursing note and vitals reviewed.   Assessment & Plan  1. Essential hypertension  - telmisartan (MICARDIS) 40 MG tablet; Take 1 tablet (40 mg total) by mouth daily.  Dispense: 90 tablet; Refill: 0 - cloNIDine (CATAPRES) 0.1 MG tablet; Take 1 tablet (0.1 mg total) by mouth 2 (two) times daily.  Dispense: 60 tablet; Refill: 2  2. Gastroesophageal reflux disease, esophagitis presence not specified  - omeprazole (PRILOSEC) 40 MG capsule; Take 1 capsule (40 mg total) by mouth daily.  Dispense: 90 capsule; Refill: 0  3. Chronic neck and back pain Stable on present therapy. Patient aware of the dependence potential, side effects, and interactions of opioids. He is compliant with the controlled substances agreement. Refills provided and follow-up  in one month. - oxyCODONE-acetaminophen (PERCOCET) 10-325 MG per tablet; Take 1 tablet by mouth every 6 (six) hours as needed. Pt. Takes 5 tabs/ day  Dispense: 155 tablet; Refill: 0  4. Anxiety Symptoms stable on present therapy. Patient is aware of the dependence potential, side effects, and drug interactions of benzodiazepines. Refills provided and follow-up in one month. - clonazePAM (KLONOPIN) 1 MG tablet; Take 1 tablet (1 mg total) by mouth 2 (two) times daily as needed for anxiety.  Dispense: 60 tablet; Refill: 0  5. Hypertriglyceridemia  - Lipid Profile  6. Cannot sleep  - zolpidem (AMBIEN) 10 MG tablet; Take 1 tablet (10 mg total) by mouth at bedtime.  Dispense: 30 tablet; Refill: 0  7. Dyslipidemia  - atorvastatin (LIPITOR) 40 MG tablet; Take 1 tablet (40 mg total) by mouth daily at 6 PM.  Dispense: 90 tablet; Refill: 0   Surina Storts Asad [facility] 06/12/2015 8:24 AM

## 2015-06-12 NOTE — Telephone Encounter (Signed)
Medication was refilled by Dr. Manuella Ghazi at office visit on today 06/12/2015

## 2015-06-13 LAB — LIPID PANEL
CHOL/HDL RATIO: 4 ratio (ref 0.0–5.0)
Cholesterol, Total: 125 mg/dL (ref 100–199)
HDL: 31 mg/dL — ABNORMAL LOW (ref >39)
LDL Calculated: 34 mg/dL (ref 0–99)
Triglycerides: 300 mg/dL — ABNORMAL HIGH (ref 0–149)
VLDL CHOLESTEROL CAL: 60 mg/dL — AB (ref 5–40)

## 2015-06-16 ENCOUNTER — Telehealth: Payer: Self-pay | Admitting: Family Medicine

## 2015-06-16 MED ORDER — ICOSAPENT ETHYL 1 G PO CAPS: 2.0000 g | ORAL_CAPSULE | Freq: Two times a day (BID) | ORAL | Status: DC

## 2015-06-16 NOTE — Telephone Encounter (Signed)
Per Dr. Manuella Ghazi vascepa 1 g has been prescribed and sent to [facility]

## 2015-06-27 ENCOUNTER — Other Ambulatory Visit: Payer: Self-pay | Admitting: Family Medicine

## 2015-06-27 DIAGNOSIS — F419 Anxiety disorder, unspecified: Secondary | ICD-10-CM

## 2015-06-27 NOTE — Telephone Encounter (Signed)
Pharmacy is requesting refill for Clonazepam 1 mg, routed to Dr. Manuella Ghazi for approval

## 2015-07-01 MED ORDER — CLONAZEPAM 1 MG PO TABS: 1.0000 mg | ORAL_TABLET | Freq: Two times a day (BID) | ORAL | Status: DC | PRN

## 2015-07-01 NOTE — Telephone Encounter (Signed)
Prescription is ready for pick up at front desk patient has been informed to bring photo ID

## 2015-07-04 ENCOUNTER — Ambulatory Visit: Payer: Medicare Other | Admitting: Family Medicine

## 2015-07-14 ENCOUNTER — Other Ambulatory Visit: Payer: Self-pay | Admitting: Family Medicine

## 2015-07-14 DIAGNOSIS — M542 Cervicalgia: Secondary | ICD-10-CM

## 2015-07-14 DIAGNOSIS — M549 Dorsalgia, unspecified: Principal | ICD-10-CM

## 2015-07-14 DIAGNOSIS — G8929 Other chronic pain: Principal | ICD-10-CM

## 2015-07-14 MED ORDER — OXYCODONE-ACETAMINOPHEN 10-325 MG PO TABS: 1.0000 | ORAL_TABLET | Freq: Four times a day (QID) | ORAL | Status: DC | PRN

## 2015-07-14 NOTE — Telephone Encounter (Signed)
Medication has been filled and ready for pick, per Dr. Manuella Ghazi patient has been prescribed a week worth of medication until next Visit on 07/17/2015

## 2015-07-17 ENCOUNTER — Encounter: Payer: Self-pay | Admitting: Family Medicine

## 2015-07-17 ENCOUNTER — Ambulatory Visit (INDEPENDENT_AMBULATORY_CARE_PROVIDER_SITE_OTHER): Payer: Medicare Other | Admitting: Family Medicine

## 2015-07-17 VITALS — BP 128/68 | HR 74 | Temp 98.3°F | Resp 16 | Ht 70.0 in | Wt 219.2 lb

## 2015-07-17 DIAGNOSIS — G4700 Insomnia, unspecified: Secondary | ICD-10-CM | POA: Diagnosis not present

## 2015-07-17 DIAGNOSIS — M549 Dorsalgia, unspecified: Secondary | ICD-10-CM | POA: Diagnosis not present

## 2015-07-17 DIAGNOSIS — M542 Cervicalgia: Secondary | ICD-10-CM

## 2015-07-17 DIAGNOSIS — Z23 Encounter for immunization: Secondary | ICD-10-CM | POA: Diagnosis not present

## 2015-07-17 DIAGNOSIS — G8929 Other chronic pain: Secondary | ICD-10-CM

## 2015-07-17 DIAGNOSIS — F419 Anxiety disorder, unspecified: Secondary | ICD-10-CM | POA: Diagnosis not present

## 2015-07-17 MED ORDER — ZOLPIDEM TARTRATE 10 MG PO TABS: 10.0000 mg | ORAL_TABLET | Freq: Every day | ORAL | Status: DC

## 2015-07-17 MED ORDER — CLONAZEPAM 1 MG PO TABS: 1.0000 mg | ORAL_TABLET | Freq: Two times a day (BID) | ORAL | Status: DC | PRN

## 2015-07-17 MED ORDER — OXYCODONE-ACETAMINOPHEN 10-325 MG PO TABS: 1.0000 | ORAL_TABLET | Freq: Four times a day (QID) | ORAL | Status: DC | PRN

## 2015-07-17 NOTE — Progress Notes (Signed)
Name: Juan Le.   MRN: [id number]    DOB: Sep 18, 1946   Date:07/17/2015       Progress Note  Subjective  Chief Complaint  Chief Complaint  Patient presents with  . Hypertension  . Hyperlipidemia  . Diabetes    Insomnia Primary symptoms: fragmented sleep.  The problem is unchanged. The symptoms are aggravated by anxiety and pain. Past treatments include medication. PMH includes: chronic pain.  Neck Pain  This is Le chronic problem. The problem has been unchanged. The pain is present in the midline. The quality of the pain is described as shooting. The pain is at Le severity of 2/10. The symptoms are aggravated by bending. He has tried oral narcotics for the symptoms.  Anxiety Presents for follow-up visit. Symptoms include insomnia and nervous/anxious behavior. Patient reports no excessive worry or panic.   Past treatments include benzodiazephines. Compliance with prior treatments has been good.    Past Medical History  Diagnosis Date  . Diabetes   . Hypertriglyceridemia   . Hypokalemia   . Vitamin D deficiency   . Chronic, continuous use of opioids   . Anxiety   . Insomnia   . Hypertension   . GERD (gastroesophageal reflux disease)   . History of diverticulitis   . Mitral regurgitation   . DJD (degenerative joint disease)   . Hypertension     Past Surgical History  Procedure Laterality Date  . Umbilical hernia repair  04/2012  . Coronary stent placement  11/2006  . Coronary artery bypass graft  1997  . Foot surgery  2003    Family History  Problem Relation Age of Onset  . Throat cancer Father   . Stroke Brother   . Stroke Mother   . Breast cancer Paternal Aunt   . Colon cancer Father   . Kidney disease Mother   . Heart disease Mother     Social History   Social History  . Marital Status: Married    Spouse Name: N/Le  . Number of Children: 0  . Years of Education: N/Le   Occupational History  . Not on file.   Social History Main Topics  .  Smoking status: Former Research scientist (life sciences)  . Smokeless tobacco: Not on file  . Alcohol Use: No  . Drug Use: No  . Sexual Activity:    Partners: Female   Other Topics Concern  . Not on file   Social History Narrative    Current outpatient prescriptions:  .  aspirin 81 MG tablet, Take 1 tablet by mouth daily., Disp: , Rfl:  .  atorvastatin (LIPITOR) 40 MG tablet, Take 1 tablet (40 mg total) by mouth daily at 6 PM., Disp: 90 tablet, Rfl: 0 .  clonazePAM (KLONOPIN) 1 MG tablet, Take 1 tablet (1 mg total) by mouth 2 (two) times daily as needed for anxiety., Disp: 60 tablet, Rfl: 0 .  cloNIDine (CATAPRES) 0.1 MG tablet, Take 1 tablet (0.1 mg total) by mouth 2 (two) times daily., Disp: 60 tablet, Rfl: 2 .  ergocalciferol (VITAMIN D2) 50000 UNITS capsule, Take 1 capsule by mouth once Le week., Disp: , Rfl:  .  glucose blood test strip, Use as instructed, Disp: 50 each, Rfl: 12 .  Icosapent Ethyl (VASCEPA) 1 G CAPS, Take 2 g by mouth 2 (two) times daily with Le meal., Disp: 120 capsule, Rfl: 0 .  metFORMIN (GLUCOPHAGE-XR) 500 MG 24 hr tablet, Take 1 tablet (500 mg total) by mouth 2 (two) times daily., Disp:  180 tablet, Rfl: 0 .  omeprazole (PRILOSEC) 40 MG capsule, Take 1 capsule (40 mg total) by mouth daily., Disp: 90 capsule, Rfl: 0 .  oxyCODONE-acetaminophen (PERCOCET) 10-325 MG per tablet, Take 1 tablet by mouth every 6 (six) hours as needed. Pt. Takes 5 tabs/ day, Disp: 25 tablet, Rfl: 0 .  telmisartan (MICARDIS) 40 MG tablet, Take 1 tablet (40 mg total) by mouth daily., Disp: 90 tablet, Rfl: 0 .  zolpidem (AMBIEN) 10 MG tablet, Take 1 tablet (10 mg total) by mouth at bedtime., Disp: 30 tablet, Rfl: 0  Allergies  Allergen Reactions  . Ace Inhibitors Cough  . Diphenhydramine     stomach upset   Review of Systems  Musculoskeletal: Positive for neck pain.  Psychiatric/Behavioral: The patient is nervous/anxious and has insomnia.     Objective  Filed Vitals:   07/17/15 0920  BP: 128/68  Pulse: 74   Temp: 98.3 F (36.8 C)  Resp: 16  Height: 5\' 10"  (1.778 m)  Weight: 219 lb 3 oz (99.423 kg)  SpO2: 96%    Physical Exam  Constitutional: He is oriented to person, place, and time and well-developed, well-nourished, and in no distress.  Cardiovascular: Normal rate and regular rhythm.   Pulmonary/Chest: Effort normal and breath sounds normal.  Musculoskeletal:       Cervical back: He exhibits tenderness and pain. He exhibits no spasm.  Neurological: He is alert and oriented to person, place, and time.  Psychiatric: Memory, affect and judgment normal.  Nursing note and vitals reviewed.   Assessment & Plan  1. Need for influenza vaccination  - Flu vaccine HIGH DOSE PF (Fluzone High dose)  2. Anxiety Symptoms are stable on present benzodiazepine therapy. - clonazePAM (KLONOPIN) 1 MG tablet; Take 1 tablet (1 mg total) by mouth 2 (two) times daily as needed for anxiety.  Dispense: 60 tablet; Refill: 0  3. Cannot sleep  - zolpidem (AMBIEN) 10 MG tablet; Take 1 tablet (10 mg total) by mouth at bedtime.  Dispense: 30 tablet; Refill: 0  4. Chronic neck and back pain Pain is controlled on opioid therapy. Patient aware of the dependence potential and the drug interactions of opioids. Refills provided - oxyCODONE-acetaminophen (PERCOCET) 10-325 MG tablet; Take 1 tablet by mouth every 6 (six) hours as needed. Pt. Takes 5 tabs/ day  Dispense: 155 tablet; Refill: 0   Juan Le. [facility] 07/17/2015 9:30 AM

## 2015-07-25 ENCOUNTER — Telehealth: Payer: Self-pay | Admitting: Family Medicine

## 2015-07-25 MED ORDER — ICOSAPENT ETHYL 1 G PO CAPS: 2.0000 g | ORAL_CAPSULE | Freq: Two times a day (BID) | ORAL | Status: DC

## 2015-07-25 NOTE — Telephone Encounter (Signed)
Vascepa 1 gm capsule has been refilled and sent to [facility]

## 2015-08-08 ENCOUNTER — Telehealth: Payer: Self-pay | Admitting: Family Medicine

## 2015-08-08 DIAGNOSIS — E119 Type 2 diabetes mellitus without complications: Secondary | ICD-10-CM

## 2015-08-08 DIAGNOSIS — K219 Gastro-esophageal reflux disease without esophagitis: Secondary | ICD-10-CM

## 2015-08-08 DIAGNOSIS — E785 Hyperlipidemia, unspecified: Secondary | ICD-10-CM

## 2015-08-08 DIAGNOSIS — I10 Essential (primary) hypertension: Secondary | ICD-10-CM

## 2015-08-08 MED ORDER — OMEPRAZOLE 40 MG PO CPDR: 40.0000 mg | DELAYED_RELEASE_CAPSULE | Freq: Every day | ORAL | Status: DC

## 2015-08-08 MED ORDER — TELMISARTAN 40 MG PO TABS: 40.0000 mg | ORAL_TABLET | Freq: Every day | ORAL | Status: DC

## 2015-08-08 MED ORDER — METFORMIN HCL ER 500 MG PO TB24: 500.0000 mg | ORAL_TABLET | Freq: Two times a day (BID) | ORAL | Status: DC

## 2015-08-08 MED ORDER — ATORVASTATIN CALCIUM 40 MG PO TABS: 40.0000 mg | ORAL_TABLET | Freq: Every day | ORAL | Status: DC

## 2015-08-08 MED ORDER — ICOSAPENT ETHYL 1 G PO CAPS: 2.0000 g | ORAL_CAPSULE | Freq: Two times a day (BID) | ORAL | Status: DC

## 2015-08-08 NOTE — Telephone Encounter (Signed)
Omeprazole 40 mg has been refilled and sent to [facility] per pharmacy request

## 2015-08-08 NOTE — Telephone Encounter (Signed)
Medication has been refilled and sent to [facility] per pharmacy request

## 2015-08-13 ENCOUNTER — Encounter: Payer: Self-pay | Admitting: Family Medicine

## 2015-08-13 ENCOUNTER — Ambulatory Visit (INDEPENDENT_AMBULATORY_CARE_PROVIDER_SITE_OTHER): Payer: Medicare Other | Admitting: Family Medicine

## 2015-08-13 VITALS — BP 122/71 | HR 67 | Temp 98.8°F | Resp 18 | Ht 70.0 in | Wt 221.3 lb

## 2015-08-13 DIAGNOSIS — M542 Cervicalgia: Secondary | ICD-10-CM | POA: Diagnosis not present

## 2015-08-13 DIAGNOSIS — G4700 Insomnia, unspecified: Secondary | ICD-10-CM

## 2015-08-13 DIAGNOSIS — E119 Type 2 diabetes mellitus without complications: Secondary | ICD-10-CM | POA: Diagnosis not present

## 2015-08-13 DIAGNOSIS — G8929 Other chronic pain: Secondary | ICD-10-CM

## 2015-08-13 DIAGNOSIS — M549 Dorsalgia, unspecified: Secondary | ICD-10-CM

## 2015-08-13 LAB — GLUCOSE, POCT (MANUAL RESULT ENTRY): POC GLUCOSE: 132 mg/dL — AB (ref 70–99)

## 2015-08-13 MED ORDER — ZOLPIDEM TARTRATE 10 MG PO TABS: 10.0000 mg | ORAL_TABLET | Freq: Every day | ORAL | Status: DC

## 2015-08-13 MED ORDER — OXYCODONE-ACETAMINOPHEN 10-325 MG PO TABS: 1.0000 | ORAL_TABLET | Freq: Four times a day (QID) | ORAL | Status: DC | PRN

## 2015-08-13 NOTE — Progress Notes (Signed)
Name: Juan Le   MRN: [id number]    DOB: 1946/09/04   Date:08/13/2015       Progress Note  Subjective  Chief Complaint  Chief Complaint  Patient presents with  . Follow-up    1 mo  . Hypertension  . Diabetes    Diabetes He presents for his follow-up diabetic visit. He has type 2 diabetes mellitus. His disease course has been stable. Hypoglycemia symptoms include nervousness/anxiousness. Pertinent negatives for hypoglycemia include no headaches. Pertinent negatives for diabetes include no chest pain. Current diabetic treatment includes oral agent (monotherapy). An ACE inhibitor/angiotensin II receptor blocker is being taken.  Neck Pain  This is a chronic problem. The problem has been unchanged. The pain is present in the midline. The pain is at a severity of 2/10. The symptoms are aggravated by bending. Pertinent negatives include no chest pain, fever or headaches. He has tried oral narcotics for the symptoms. The treatment provided significant relief.  Insomnia Primary symptoms: sleep disturbance.  The symptoms are aggravated by pain and anxiety. Past treatments include medication. PMH includes: family stress or anxiety, chronic pain.   Past Medical History  Diagnosis Date  . Diabetes ([place])   . Hypertriglyceridemia   . Hypokalemia   . Vitamin D deficiency   . Chronic, continuous use of opioids   . Anxiety   . Insomnia   . Hypertension   . GERD (gastroesophageal reflux disease)   . History of diverticulitis   . Mitral regurgitation   . DJD (degenerative joint disease)   . Hypertension     Past Surgical History  Procedure Laterality Date  . Umbilical hernia repair  04/2012  . Coronary stent placement  11/2006  . Coronary artery bypass graft  1997  . Foot surgery  2003    Family History  Problem Relation Age of Onset  . Throat cancer Father   . Stroke Brother   . Stroke Mother   . Breast cancer Paternal Aunt   . Colon cancer Father   . Kidney disease  Mother   . Heart disease Mother     Social History   Social History  . Marital Status: Married    Spouse Name: N/A  . Number of Children: 0  . Years of Education: N/A   Occupational History  . Not on file.   Social History Main Topics  . Smoking status: Former Research scientist (life sciences)  . Smokeless tobacco: Not on file  . Alcohol Use: No  . Drug Use: No  . Sexual Activity:    Partners: Female   Other Topics Concern  . Not on file   Social History Narrative     Current outpatient prescriptions:  .  aspirin 81 MG tablet, Take 1 tablet by mouth daily., Disp: , Rfl:  .  atorvastatin (LIPITOR) 40 MG tablet, Take 1 tablet (40 mg total) by mouth daily at 6 PM., Disp: 90 tablet, Rfl: 0 .  clonazePAM (KLONOPIN) 1 MG tablet, Take 1 tablet (1 mg total) by mouth 2 (two) times daily as needed for anxiety., Disp: 60 tablet, Rfl: 0 .  cloNIDine (CATAPRES) 0.1 MG tablet, Take 1 tablet (0.1 mg total) by mouth 2 (two) times daily., Disp: 60 tablet, Rfl: 2 .  glucose blood test strip, Use as instructed, Disp: 50 each, Rfl: 12 .  metFORMIN (GLUCOPHAGE-XR) 500 MG 24 hr tablet, Take 1 tablet (500 mg total) by mouth 2 (two) times daily., Disp: 180 tablet, Rfl: 0 .  omeprazole (PRILOSEC) 40 MG  capsule, Take 1 capsule (40 mg total) by mouth daily., Disp: 90 capsule, Rfl: 0 .  oxyCODONE-acetaminophen (PERCOCET) 10-325 MG tablet, Take 1 tablet by mouth every 6 (six) hours as needed. Pt. Takes 5 tabs/ day, Disp: 155 tablet, Rfl: 0 .  telmisartan (MICARDIS) 40 MG tablet, Take 1 tablet (40 mg total) by mouth daily., Disp: 90 tablet, Rfl: 0 .  zolpidem (AMBIEN) 10 MG tablet, Take 1 tablet (10 mg total) by mouth at bedtime., Disp: 30 tablet, Rfl: 0 .  ergocalciferol (VITAMIN D2) 50000 UNITS capsule, Take 1 capsule by mouth once a week., Disp: , Rfl:  .  Icosapent Ethyl (VASCEPA) 1 G CAPS, Take 2 g by mouth 2 (two) times daily with a meal. (Patient not taking: Reported on 08/13/2015), Disp: 120 capsule, Rfl: 0  Allergies    Allergen Reactions  . Ace Inhibitors Cough  . Diphenhydramine     stomach upset     Review of Systems  Constitutional: Negative for fever.  Cardiovascular: Negative for chest pain.  Musculoskeletal: Positive for back pain and neck pain.  Neurological: Negative for headaches.  Psychiatric/Behavioral: Positive for sleep disturbance. The patient is nervous/anxious and has insomnia.     Objective  Filed Vitals:   08/13/15 0810  BP: 122/71  Pulse: 67  Temp: 98.8 F (37.1 C)  TempSrc: Oral  Resp: 18  Height: 5\' 10"  (1.778 m)  Weight: 221 lb 4.8 oz (100.381 kg)  SpO2: 96%    Physical Exam  Constitutional: He is oriented to person, place, and time and well-developed, well-nourished, and in no distress.  Cardiovascular: Normal rate, regular rhythm and normal heart sounds.   No murmur heard. Pulmonary/Chest: Effort normal and breath sounds normal. He has no wheezes.  Musculoskeletal:       Cervical back: He exhibits tenderness, pain and spasm.       Back:  Neurological: He is alert and oriented to person, place, and time.  Skin: Skin is warm and dry.  Psychiatric: Memory and affect normal.  Nursing note and vitals reviewed.   Assessment & Plan  1. Type 2 diabetes mellitus without complication, unspecified long term insulin use status (HCC)  - POCT Glucose (CBG)  2. Chronic neck and back pain Symptoms stable on chronic opioid therapy. Patient compliant with controlled substances agreement. Refills provided. - oxyCODONE-acetaminophen (PERCOCET) 10-325 MG tablet; Take 1 tablet by mouth every 6 (six) hours as needed. Pt. Takes 5 tabs/ day  Dispense: 155 tablet; Refill: 0  3. Cannot sleep  - zolpidem (AMBIEN) 10 MG tablet; Take 1 tablet (10 mg total) by mouth at bedtime.  Dispense: 30 tablet; Refill: 2     Martyn Timme Asad A. [facility] 08/13/2015 8:23 AM

## 2015-08-14 ENCOUNTER — Other Ambulatory Visit: Payer: Self-pay | Admitting: Family Medicine

## 2015-08-14 DIAGNOSIS — F419 Anxiety disorder, unspecified: Secondary | ICD-10-CM

## 2015-08-14 DIAGNOSIS — I10 Essential (primary) hypertension: Secondary | ICD-10-CM

## 2015-08-14 MED ORDER — CLONIDINE HCL 0.1 MG PO TABS: 0.1000 mg | ORAL_TABLET | Freq: Two times a day (BID) | ORAL | Status: DC

## 2015-08-14 NOTE — Telephone Encounter (Signed)
Clonidine .1mg  has been refilled and sent to [facility], Clonazepe, 1 mg has been routed to Dr. Manuella Ghazi for approval

## 2015-09-08 ENCOUNTER — Encounter: Payer: Self-pay | Admitting: Family Medicine

## 2015-09-08 ENCOUNTER — Other Ambulatory Visit: Payer: Self-pay

## 2015-09-08 ENCOUNTER — Ambulatory Visit (INDEPENDENT_AMBULATORY_CARE_PROVIDER_SITE_OTHER): Payer: Medicare Other | Admitting: Family Medicine

## 2015-09-08 VITALS — BP 120/72 | HR 81 | Temp 99.4°F | Resp 19 | Ht 70.0 in | Wt 220.8 lb

## 2015-09-08 DIAGNOSIS — M549 Dorsalgia, unspecified: Secondary | ICD-10-CM

## 2015-09-08 DIAGNOSIS — F419 Anxiety disorder, unspecified: Secondary | ICD-10-CM

## 2015-09-08 DIAGNOSIS — K219 Gastro-esophageal reflux disease without esophagitis: Secondary | ICD-10-CM

## 2015-09-08 DIAGNOSIS — E785 Hyperlipidemia, unspecified: Secondary | ICD-10-CM

## 2015-09-08 DIAGNOSIS — G8929 Other chronic pain: Principal | ICD-10-CM

## 2015-09-08 DIAGNOSIS — M542 Cervicalgia: Secondary | ICD-10-CM | POA: Diagnosis not present

## 2015-09-08 MED ORDER — OMEPRAZOLE 40 MG PO CPDR: 40.0000 mg | DELAYED_RELEASE_CAPSULE | Freq: Every day | ORAL | Status: DC

## 2015-09-08 MED ORDER — ATORVASTATIN CALCIUM 40 MG PO TABS: 40.0000 mg | ORAL_TABLET | Freq: Every day | ORAL | Status: DC

## 2015-09-08 MED ORDER — OXYCODONE-ACETAMINOPHEN 10-325 MG PO TABS: 1.0000 | ORAL_TABLET | Freq: Four times a day (QID) | ORAL | Status: DC | PRN

## 2015-09-08 MED ORDER — CLONAZEPAM 1 MG PO TABS: 1.0000 mg | ORAL_TABLET | Freq: Two times a day (BID) | ORAL | Status: DC | PRN

## 2015-09-08 NOTE — Progress Notes (Signed)
Name: Juan Le   MRN: [id number]    DOB: January 20, 1946   Date:09/08/2015       Progress Note  Subjective  Chief Complaint  Chief Complaint  Patient presents with  . Follow-up    1 mo  . Medication Refill    Neck Pain  This is a chronic problem. The problem has been unchanged. Associated with: pt. has histry of degenerative disc disease in his neck. The pain is at a severity of 2/10 (as long as he is taking medication, it is controlled.). The pain is mild. The symptoms are aggravated by position and bending. Associated symptoms include numbness (in feet). Pertinent negatives include no headaches, leg pain or weakness. He has tried oral narcotics for the symptoms. The treatment provided significant relief.  Anxiety Presents for follow-up visit. The problem has been unchanged. Symptoms include excessive worry, insomnia and nervous/anxious behavior. Patient reports no irritability, palpitations or shortness of breath. The severity of symptoms is moderate.   Past treatments include benzodiazephines. The treatment provided significant relief. Compliance with prior treatments has been good.  Back Pain This is a chronic problem. The problem is unchanged. The pain is present in the thoracic spine. The quality of the pain is described as shooting. The pain does not radiate. The pain is at a severity of 2/10. The pain is moderate (pain is controlled as long as he is on medication). Associated symptoms include numbness (in feet). Pertinent negatives include no bladder incontinence, bowel incontinence, headaches, leg pain or weakness. He has tried analgesics for the symptoms. The treatment provided significant relief.    Past Medical History  Diagnosis Date  . Diabetes ([place])   . Hypertriglyceridemia   . Hypokalemia   . Vitamin D deficiency   . Chronic, continuous use of opioids   . Anxiety   . Insomnia   . Hypertension   . GERD (gastroesophageal reflux disease)   . History of  diverticulitis   . Mitral regurgitation   . DJD (degenerative joint disease)   . Hypertension     Past Surgical History  Procedure Laterality Date  . Umbilical hernia repair  04/2012  . Coronary stent placement  11/2006  . Coronary artery bypass graft  1997  . Foot surgery  2003    Family History  Problem Relation Age of Onset  . Throat cancer Father   . Stroke Brother   . Stroke Mother   . Breast cancer Paternal Aunt   . Colon cancer Father   . Kidney disease Mother   . Heart disease Mother     Social History   Social History  . Marital Status: Married    Spouse Name: N/A  . Number of Children: 0  . Years of Education: N/A   Occupational History  . Not on file.   Social History Main Topics  . Smoking status: Former Research scientist (life sciences)  . Smokeless tobacco: Not on file  . Alcohol Use: No  . Drug Use: No  . Sexual Activity:    Partners: Female   Other Topics Concern  . Not on file   Social History Narrative     Current outpatient prescriptions:  .  aspirin 81 MG tablet, Take 1 tablet by mouth daily., Disp: , Rfl:  .  atorvastatin (LIPITOR) 40 MG tablet, Take 1 tablet (40 mg total) by mouth daily at 6 PM., Disp: 90 tablet, Rfl: 0 .  clonazePAM (KLONOPIN) 1 MG tablet, Take 1 tablet (1 mg total) by mouth 2 (  two) times daily as needed for anxiety., Disp: 60 tablet, Rfl: 0 .  cloNIDine (CATAPRES) 0.1 MG tablet, Take 1 tablet (0.1 mg total) by mouth 2 (two) times daily., Disp: 60 tablet, Rfl: 2 .  ergocalciferol (VITAMIN D2) 50000 UNITS capsule, Take 1 capsule by mouth once a week., Disp: , Rfl:  .  glucose blood test strip, Use as instructed, Disp: 50 each, Rfl: 12 .  Icosapent Ethyl (VASCEPA) 1 G CAPS, Take 2 g by mouth 2 (two) times daily with a meal., Disp: 120 capsule, Rfl: 0 .  metFORMIN (GLUCOPHAGE-XR) 500 MG 24 hr tablet, Take 1 tablet (500 mg total) by mouth 2 (two) times daily., Disp: 180 tablet, Rfl: 0 .  omeprazole (PRILOSEC) 40 MG capsule, Take 1 capsule (40 mg  total) by mouth daily., Disp: 90 capsule, Rfl: 0 .  oxyCODONE-acetaminophen (PERCOCET) 10-325 MG tablet, Take 1 tablet by mouth every 6 (six) hours as needed. Pt. Takes 5 tabs/ day, Disp: 155 tablet, Rfl: 0 .  telmisartan (MICARDIS) 40 MG tablet, Take 1 tablet (40 mg total) by mouth daily., Disp: 90 tablet, Rfl: 0 .  zolpidem (AMBIEN) 10 MG tablet, Take 1 tablet (10 mg total) by mouth at bedtime., Disp: 30 tablet, Rfl: 2  Allergies  Allergen Reactions  . Ace Inhibitors Cough  . Diphenhydramine     stomach upset     Review of Systems  Constitutional: Negative for irritability.  Respiratory: Negative for shortness of breath.   Cardiovascular: Negative for palpitations.  Gastrointestinal: Negative for bowel incontinence.  Genitourinary: Negative for bladder incontinence.  Musculoskeletal: Positive for back pain and neck pain.  Neurological: Positive for numbness (in feet). Negative for weakness and headaches.  Psychiatric/Behavioral: The patient is nervous/anxious and has insomnia.     Objective  Filed Vitals:   09/08/15 1030  BP: 120/72  Pulse: 81  Temp: 99.4 F (37.4 C)  TempSrc: Oral  Resp: 19  Height: 5\' 10"  (1.778 m)  Weight: 220 lb 12.8 oz (100.154 kg)  SpO2: 96%    Physical Exam  Constitutional: He is oriented to person, place, and time and well-developed, well-nourished, and in no distress.  HENT:  Head: Normocephalic and atraumatic.  Cardiovascular: Normal rate and regular rhythm.   Pulmonary/Chest: Effort normal and breath sounds normal.  Musculoskeletal:       Cervical back: He exhibits tenderness, pain and spasm. He exhibits no swelling.       Lumbar back: He exhibits tenderness.       Back:  Neurological: He is alert and oriented to person, place, and time.  Skin: Skin is warm and dry.  Psychiatric: Affect normal.  Nursing note and vitals reviewed.   Assessment & Plan  1. Chronic neck and back pain Anus controlled on present opioid therapy. Refills  provided to - oxyCODONE-acetaminophen (PERCOCET) 10-325 MG tablet; Take 1 tablet by mouth every 6 (six) hours as needed. Pt. Takes 5 tabs/ day  Dispense: 155 tablet; Refill: 0  2. Anxiety Stable and controlled on clonazepam twice daily as needed. Refills provided. - clonazePAM (KLONOPIN) 1 MG tablet; Take 1 tablet (1 mg total) by mouth 2 (two) times daily as needed for anxiety.  Dispense: 60 tablet; Refill: 0   Vetra Shinall Asad A. [facility] 09/08/2015 11:18 AM

## 2015-09-08 NOTE — Telephone Encounter (Signed)
Medication has been refilled and sent to Pharmacy °

## 2015-09-15 ENCOUNTER — Ambulatory Visit: Payer: Self-pay | Admitting: Family Medicine

## 2015-10-02 ENCOUNTER — Encounter: Payer: Self-pay | Admitting: Family Medicine

## 2015-10-02 ENCOUNTER — Ambulatory Visit (INDEPENDENT_AMBULATORY_CARE_PROVIDER_SITE_OTHER): Payer: Medicare Other | Admitting: Family Medicine

## 2015-10-02 VITALS — BP 122/70 | HR 75 | Temp 98.7°F | Resp 15 | Ht 70.0 in | Wt 220.3 lb

## 2015-10-02 DIAGNOSIS — M542 Cervicalgia: Secondary | ICD-10-CM | POA: Diagnosis not present

## 2015-10-02 DIAGNOSIS — M549 Dorsalgia, unspecified: Secondary | ICD-10-CM

## 2015-10-02 DIAGNOSIS — F419 Anxiety disorder, unspecified: Secondary | ICD-10-CM | POA: Diagnosis not present

## 2015-10-02 DIAGNOSIS — G8929 Other chronic pain: Secondary | ICD-10-CM

## 2015-10-02 MED ORDER — OXYCODONE-ACETAMINOPHEN 10-325 MG PO TABS: 1.0000 | ORAL_TABLET | Freq: Four times a day (QID) | ORAL | Status: DC | PRN

## 2015-10-02 MED ORDER — CLONAZEPAM 1 MG PO TABS: 1.0000 mg | ORAL_TABLET | Freq: Two times a day (BID) | ORAL | Status: DC | PRN

## 2015-10-02 NOTE — Progress Notes (Signed)
Name: Juan Le   MRN: [id number]    DOB: [birth year]   Date:10/02/2015       Progress Note  Subjective  Chief Complaint  Chief Complaint  Patient presents with  . Follow-up    1 mo  . Medication Refill  . Hypertension  . Gastroesophageal Reflux    Neck Pain  This is a chronic problem. The problem has been unchanged. Associated with: pt. has histry of degenerative disc disease in his neck. The pain is at a severity of 2/10 (as long as he is taking medication, it is controlled.). The pain is mild. The symptoms are aggravated by position and bending. He has tried oral narcotics for the symptoms. The treatment provided significant relief.  Anxiety Presents for follow-up visit. The problem has been unchanged. Symptoms include excessive worry, insomnia and nervous/anxious behavior. Patient reports no irritability, palpitations or shortness of breath. The severity of symptoms is moderate.   Past treatments include benzodiazephines. The treatment provided significant relief. Compliance with prior treatments has been good.      Past Medical History  Diagnosis Date  . Diabetes ([place])   . Hypertriglyceridemia   . Hypokalemia   . Vitamin D deficiency   . Chronic, continuous use of opioids   . Anxiety   . Insomnia   . Hypertension   . GERD (gastroesophageal reflux disease)   . History of diverticulitis   . Mitral regurgitation   . DJD (degenerative joint disease)   . Hypertension     Past Surgical History  Procedure Laterality Date  . Umbilical hernia repair  04/2012  . Coronary stent placement  11/2006  . Coronary artery bypass graft  1997  . Foot surgery  2003    Family History  Problem Relation Age of Onset  . Throat cancer Father   . Stroke Brother   . Stroke Mother   . Breast cancer Paternal Aunt   . Colon cancer Father   . Kidney disease Mother   . Heart disease Mother     Social History   Social History  . Marital Status: Married    Spouse Name: N/A   . Number of Children: 0  . Years of Education: N/A   Occupational History  . Not on file.   Social History Main Topics  . Smoking status: Former Research scientist (life sciences)  . Smokeless tobacco: Not on file  . Alcohol Use: No  . Drug Use: No  . Sexual Activity:    Partners: Female   Other Topics Concern  . Not on file   Social History Narrative     Current outpatient prescriptions:  .  aspirin 81 MG tablet, Take 1 tablet by mouth daily., Disp: , Rfl:  .  atorvastatin (LIPITOR) 40 MG tablet, Take 1 tablet (40 mg total) by mouth daily at 6 PM., Disp: 90 tablet, Rfl: 0 .  clonazePAM (KLONOPIN) 1 MG tablet, Take 1 tablet (1 mg total) by mouth 2 (two) times daily as needed for anxiety., Disp: 60 tablet, Rfl: 0 .  cloNIDine (CATAPRES) 0.1 MG tablet, Take 1 tablet (0.1 mg total) by mouth 2 (two) times daily., Disp: 60 tablet, Rfl: 2 .  ergocalciferol (VITAMIN D2) 50000 UNITS capsule, Take 1 capsule by mouth once a week., Disp: , Rfl:  .  glucose blood test strip, Use as instructed, Disp: 50 each, Rfl: 12 .  Icosapent Ethyl (VASCEPA) 1 G CAPS, Take 2 g by mouth 2 (two) times daily with a meal., Disp: 120 capsule,  Rfl: 0 .  metFORMIN (GLUCOPHAGE-XR) 500 MG 24 hr tablet, Take 1 tablet (500 mg total) by mouth 2 (two) times daily., Disp: 180 tablet, Rfl: 0 .  omeprazole (PRILOSEC) 40 MG capsule, Take 1 capsule (40 mg total) by mouth daily., Disp: 90 capsule, Rfl: 0 .  oxyCODONE-acetaminophen (PERCOCET) 10-325 MG tablet, Take 1 tablet by mouth every 6 (six) hours as needed. Pt. Takes 5 tabs/ day, Disp: 155 tablet, Rfl: 0 .  telmisartan (MICARDIS) 40 MG tablet, Take 1 tablet (40 mg total) by mouth daily., Disp: 90 tablet, Rfl: 0 .  zolpidem (AMBIEN) 10 MG tablet, Take 1 tablet (10 mg total) by mouth at bedtime., Disp: 30 tablet, Rfl: 2  Allergies  Allergen Reactions  . Ace Inhibitors Cough  . Diphenhydramine     stomach upset     Review of Systems  Constitutional: Negative for irritability.  Respiratory:  Negative for shortness of breath.   Cardiovascular: Negative for palpitations.  Musculoskeletal: Positive for neck pain.  Psychiatric/Behavioral: The patient is nervous/anxious and has insomnia.       Objective  Filed Vitals:   10/02/15 0830  BP: 122/70  Pulse: 75  Temp: 98.7 F (37.1 C)  TempSrc: Oral  Resp: 15  Height: 5\' 10"  (1.778 m)  Weight: 220 lb 4.8 oz (99.927 kg)  SpO2: 96%    Physical Exam  Constitutional: He is oriented to person, place, and time and well-developed, well-nourished, and in no distress.  HENT:  Head: Normocephalic and atraumatic.  Cardiovascular: Normal rate and regular rhythm.   Pulmonary/Chest: Effort normal and breath sounds normal.  Musculoskeletal:       Cervical back: He exhibits tenderness, pain and spasm. He exhibits no swelling.       Lumbar back: He exhibits tenderness.       Back:  Neurological: He is alert and oriented to person, place, and time.  Skin: Skin is warm and dry.  Psychiatric: Affect normal.  Nursing note and vitals reviewed.     Assessment & Plan  1. Anxiety  - clonazePAM (KLONOPIN) 1 MG tablet; Take 1 tablet (1 mg total) by mouth 2 (two) times daily as needed for anxiety. Please fill on/after October 08, 2015  Dispense: 60 tablet; Refill: 0  2. Chronic neck and back pain  - oxyCODONE-acetaminophen (PERCOCET) 10-325 MG tablet; Take 1 tablet by mouth every 6 (six) hours as needed. Pt. Takes 5 tabs/ day  Dispense: 155 tablet; Refill: 0   Bristol Osentoski Asad A. [facility] 10/02/2015 8:36 AM

## 2015-11-05 ENCOUNTER — Encounter: Payer: Self-pay | Admitting: Family Medicine

## 2015-11-05 ENCOUNTER — Ambulatory Visit (INDEPENDENT_AMBULATORY_CARE_PROVIDER_SITE_OTHER): Payer: Medicare Other | Admitting: Family Medicine

## 2015-11-05 VITALS — BP 120/70 | HR 73 | Temp 98.9°F | Resp 17 | Ht 70.0 in | Wt 224.4 lb

## 2015-11-05 DIAGNOSIS — F419 Anxiety disorder, unspecified: Secondary | ICD-10-CM | POA: Diagnosis not present

## 2015-11-05 DIAGNOSIS — G8929 Other chronic pain: Secondary | ICD-10-CM

## 2015-11-05 DIAGNOSIS — I10 Essential (primary) hypertension: Secondary | ICD-10-CM | POA: Diagnosis not present

## 2015-11-05 DIAGNOSIS — M542 Cervicalgia: Secondary | ICD-10-CM

## 2015-11-05 MED ORDER — CLONIDINE HCL 0.1 MG PO TABS
0.1000 mg | ORAL_TABLET | Freq: Two times a day (BID) | ORAL | Status: DC
Start: 2015-11-05 — End: 2016-01-26

## 2015-11-05 MED ORDER — OXYCODONE-ACETAMINOPHEN 10-325 MG PO TABS: 1.0000 | ORAL_TABLET | Freq: Four times a day (QID) | ORAL | Status: DC | PRN

## 2015-11-05 MED ORDER — CLONAZEPAM 1 MG PO TABS: 1.0000 mg | ORAL_TABLET | Freq: Two times a day (BID) | ORAL | Status: DC | PRN

## 2015-11-05 NOTE — Progress Notes (Signed)
Name: Juan Le   MRN: [id number]    DOB: 03-26-1946   Date:11/05/2015       Progress Note  Subjective  Chief Complaint  Chief Complaint  Patient presents with  . Follow-up    1 mo  . Hypertension  . Back Pain  . Gastroesophageal Reflux  . Medication Refill    Hypertension This is a chronic problem. The problem is controlled. Associated symptoms include anxiety and neck pain. Pertinent negatives include no blurred vision, palpitations or shortness of breath. Past treatments include central alpha agonists and angiotensin blockers. There are no compliance problems.   Neck Pain  This is a chronic problem. The problem has been unchanged. Associated with: pt. has histry of degenerative disc disease in his neck. The pain is at a severity of 2/10 (as long as he is taking medication, it is controlled.). The pain is mild. The symptoms are aggravated by position and bending. He has tried oral narcotics for the symptoms. The treatment provided significant relief.  Anxiety Presents for follow-up visit. The problem has been unchanged. Symptoms include excessive worry, insomnia and nervous/anxious behavior. Patient reports no irritability, palpitations or shortness of breath. The severity of symptoms is moderate.   Past treatments include benzodiazephines. The treatment provided significant relief. Compliance with prior treatments has been good.    Past Medical History  Diagnosis Date  . Diabetes ([place])   . Hypertriglyceridemia   . Hypokalemia   . Vitamin D deficiency   . Chronic, continuous use of opioids   . Anxiety   . Insomnia   . Hypertension   . GERD (gastroesophageal reflux disease)   . History of diverticulitis   . Mitral regurgitation   . DJD (degenerative joint disease)   . Hypertension     Past Surgical History  Procedure Laterality Date  . Umbilical hernia repair  04/2012  . Coronary stent placement  11/2006  . Coronary artery bypass graft  1997  . Foot surgery   2003    Family History  Problem Relation Age of Onset  . Throat cancer Father   . Stroke Brother   . Stroke Mother   . Breast cancer Paternal Aunt   . Colon cancer Father   . Kidney disease Mother   . Heart disease Mother     Social History   Social History  . Marital Status: Married    Spouse Name: N/A  . Number of Children: 0  . Years of Education: N/A   Occupational History  . Not on file.   Social History Main Topics  . Smoking status: Former Research scientist (life sciences)  . Smokeless tobacco: Not on file  . Alcohol Use: No  . Drug Use: No  . Sexual Activity:    Partners: Female   Other Topics Concern  . Not on file   Social History Narrative     Current outpatient prescriptions:  .  aspirin 81 MG tablet, Take 1 tablet by mouth daily., Disp: , Rfl:  .  atorvastatin (LIPITOR) 40 MG tablet, Take 1 tablet (40 mg total) by mouth daily at 6 PM., Disp: 90 tablet, Rfl: 0 .  clonazePAM (KLONOPIN) 1 MG tablet, Take 1 tablet (1 mg total) by mouth 2 (two) times daily as needed for anxiety. Please fill on/after October 08, 2015, Disp: 60 tablet, Rfl: 0 .  cloNIDine (CATAPRES) 0.1 MG tablet, Take 1 tablet (0.1 mg total) by mouth 2 (two) times daily., Disp: 60 tablet, Rfl: 2 .  ergocalciferol (VITAMIN D2)  50000 UNITS capsule, Take 1 capsule by mouth once a week., Disp: , Rfl:  .  glucose blood test strip, Use as instructed, Disp: 50 each, Rfl: 12 .  Icosapent Ethyl (VASCEPA) 1 G CAPS, Take 2 g by mouth 2 (two) times daily with a meal., Disp: 120 capsule, Rfl: 0 .  metFORMIN (GLUCOPHAGE-XR) 500 MG 24 hr tablet, Take 1 tablet (500 mg total) by mouth 2 (two) times daily., Disp: 180 tablet, Rfl: 0 .  omeprazole (PRILOSEC) 40 MG capsule, Take 1 capsule (40 mg total) by mouth daily., Disp: 90 capsule, Rfl: 0 .  oxyCODONE-acetaminophen (PERCOCET) 10-325 MG tablet, Take 1 tablet by mouth every 6 (six) hours as needed. Pt. Takes 5 tabs/ day, Disp: 155 tablet, Rfl: 0 .  telmisartan (MICARDIS) 40 MG tablet,  Take 1 tablet (40 mg total) by mouth daily., Disp: 90 tablet, Rfl: 0 .  zolpidem (AMBIEN) 10 MG tablet, Take 1 tablet (10 mg total) by mouth at bedtime., Disp: 30 tablet, Rfl: 2  Allergies  Allergen Reactions  . Ace Inhibitors Cough  . Diphenhydramine     stomach upset    Review of Systems  Constitutional: Negative for irritability.  Eyes: Negative for blurred vision.  Respiratory: Negative for shortness of breath.   Cardiovascular: Negative for palpitations.  Musculoskeletal: Positive for neck pain.  Psychiatric/Behavioral: The patient is nervous/anxious and has insomnia.     Objective  Filed Vitals:   11/05/15 1118  BP: 120/70  Pulse: 73  Temp: 98.9 F (37.2 C)  TempSrc: Oral  Resp: 17  Height: 5\' 10"  (1.778 m)  Weight: 224 lb 6.4 oz (101.787 kg)  SpO2: 96%    Physical Exam  Constitutional: He is oriented to person, place, and time and well-developed, well-nourished, and in no distress.  HENT:  Head: Normocephalic and atraumatic.  Cardiovascular: Normal rate and regular rhythm.   Pulmonary/Chest: Effort normal and breath sounds normal.  Musculoskeletal:       Back:  Neurological: He is alert and oriented to person, place, and time.  Nursing note and vitals reviewed.    Recent Results (from the past 2160 hour(s))  POCT Glucose (CBG)     Status: Abnormal   Collection Time: 08/13/15  8:21 AM  Result Value Ref Range   POC Glucose 132 (A) 70 - 99 mg/dl     Assessment & Plan  1. Anxiety Stable and responsive to clonazepam taken twice daily as needed. Refills provided - clonazePAM (KLONOPIN) 1 MG tablet; Take 1 tablet (1 mg total) by mouth 2 (two) times daily as needed for anxiety.  Dispense: 60 tablet; Refill: 0  2. Essential hypertension  - cloNIDine (CATAPRES) 0.1 MG tablet; Take 1 tablet (0.1 mg total) by mouth 2 (two) times daily.  Dispense: 60 tablet; Refill: 2  3. Chronic neck pain Responsive to Percocet. Compliant with controlled substances  agreement and understands the risks, dependence potential, side effects, and drug interactions of opioids. Refills provided and follow-up in one month. - oxyCODONE-acetaminophen (PERCOCET) 10-325 MG tablet; Take 1 tablet by mouth every 6 (six) hours as needed. Pt. Takes 5 tabs/ day  Dispense: 155 tablet; Refill: 0     Aiman Noe Asad A. [facility] 11/05/2015 11:53 AM

## 2015-11-07 ENCOUNTER — Encounter: Payer: Self-pay | Admitting: Family Medicine

## 2015-11-07 ENCOUNTER — Ambulatory Visit: Payer: Medicare Other | Admitting: Family Medicine

## 2015-11-07 ENCOUNTER — Ambulatory Visit (INDEPENDENT_AMBULATORY_CARE_PROVIDER_SITE_OTHER): Payer: Medicare Other | Admitting: Family Medicine

## 2015-11-07 VITALS — BP 128/62 | HR 63 | Temp 98.1°F | Resp 17 | Ht 70.0 in | Wt 228.5 lb

## 2015-11-07 DIAGNOSIS — I10 Essential (primary) hypertension: Secondary | ICD-10-CM | POA: Diagnosis not present

## 2015-11-07 DIAGNOSIS — E119 Type 2 diabetes mellitus without complications: Secondary | ICD-10-CM

## 2015-11-07 DIAGNOSIS — E785 Hyperlipidemia, unspecified: Secondary | ICD-10-CM

## 2015-11-07 DIAGNOSIS — G4700 Insomnia, unspecified: Secondary | ICD-10-CM | POA: Diagnosis not present

## 2015-11-07 DIAGNOSIS — E781 Pure hyperglyceridemia: Secondary | ICD-10-CM

## 2015-11-07 LAB — POCT GLYCOSYLATED HEMOGLOBIN (HGB A1C): HEMOGLOBIN A1C: 5.9

## 2015-11-07 MED ORDER — ZOLPIDEM TARTRATE 10 MG PO TABS: 10.0000 mg | ORAL_TABLET | Freq: Every day | ORAL | Status: DC

## 2015-11-07 MED ORDER — TELMISARTAN 40 MG PO TABS: 40.0000 mg | ORAL_TABLET | Freq: Every day | ORAL | Status: DC

## 2015-11-07 NOTE — Progress Notes (Signed)
Name: Juan Le   MRN: [id number]    DOB: [birth year]   Date:11/07/2015       Progress Note  Subjective  Chief Complaint  Chief Complaint  Patient presents with  . Follow-up    2 day    Hypertension This is a chronic problem. The problem is controlled. Pertinent negatives include no blurred vision, chest pain, headaches or palpitations. Past treatments include angiotensin blockers and central alpha agonists. Hypertensive end-organ damage includes CAD/MI. There is no history of kidney disease or CVA.  Insomnia Primary symptoms: fragmented sleep, sleep disturbance, difficulty falling asleep.  The symptoms are aggravated by pain. Past treatments include medication. How long after going to bed to you fall asleep: 30-60 minutes.   PMH includes: hypertension, chronic pain.  Diabetes He presents for his follow-up diabetic visit. He has type 2 diabetes mellitus. His disease course has been stable. Pertinent negatives for hypoglycemia include no headaches. Pertinent negatives for diabetes include no blurred vision and no chest pain. Diabetic complications include heart disease. Pertinent negatives for diabetic complications include no CVA. Current diabetic treatment includes oral agent (monotherapy). He is following a generally healthy diet. His breakfast blood glucose range is generally 140-180 mg/dl. An ACE inhibitor/angiotensin II receptor blocker is being taken.    Past Medical History  Diagnosis Date  . Diabetes ([place])   . Hypertriglyceridemia   . Hypokalemia   . Vitamin D deficiency   . Chronic, continuous use of opioids   . Anxiety   . Insomnia   . Hypertension   . GERD (gastroesophageal reflux disease)   . History of diverticulitis   . Mitral regurgitation   . DJD (degenerative joint disease)   . Hypertension     Past Surgical History  Procedure Laterality Date  . Umbilical hernia repair  04/2012  . Coronary stent placement  11/2006  . Coronary artery bypass graft   1997  . Foot surgery  2003    Family History  Problem Relation Age of Onset  . Throat cancer Father   . Stroke Brother   . Stroke Mother   . Breast cancer Paternal Aunt   . Colon cancer Father   . Kidney disease Mother   . Heart disease Mother     Social History   Social History  . Marital Status: Married    Spouse Name: N/A  . Number of Children: 0  . Years of Education: N/A   Occupational History  . Not on file.   Social History Main Topics  . Smoking status: Former Research scientist (life sciences)  . Smokeless tobacco: Not on file  . Alcohol Use: No  . Drug Use: No  . Sexual Activity:    Partners: Female   Other Topics Concern  . Not on file   Social History Narrative     Current outpatient prescriptions:  .  aspirin 81 MG tablet, Take 1 tablet by mouth daily., Disp: , Rfl:  .  atorvastatin (LIPITOR) 40 MG tablet, Take 1 tablet (40 mg total) by mouth daily at 6 PM., Disp: 90 tablet, Rfl: 0 .  clonazePAM (KLONOPIN) 1 MG tablet, Take 1 tablet (1 mg total) by mouth 2 (two) times daily as needed for anxiety., Disp: 60 tablet, Rfl: 0 .  cloNIDine (CATAPRES) 0.1 MG tablet, Take 1 tablet (0.1 mg total) by mouth 2 (two) times daily., Disp: 60 tablet, Rfl: 2 .  ergocalciferol (VITAMIN D2) 50000 UNITS capsule, Take 1 capsule by mouth once a week., Disp: , Rfl:  .  glucose blood test strip, Use as instructed, Disp: 50 each, Rfl: 12 .  Icosapent Ethyl (VASCEPA) 1 G CAPS, Take 2 g by mouth 2 (two) times daily with a meal., Disp: 120 capsule, Rfl: 0 .  metFORMIN (GLUCOPHAGE-XR) 500 MG 24 hr tablet, Take 1 tablet (500 mg total) by mouth 2 (two) times daily., Disp: 180 tablet, Rfl: 0 .  omeprazole (PRILOSEC) 40 MG capsule, Take 1 capsule (40 mg total) by mouth daily., Disp: 90 capsule, Rfl: 0 .  oxyCODONE-acetaminophen (PERCOCET) 10-325 MG tablet, Take 1 tablet by mouth every 6 (six) hours as needed. Pt. Takes 5 tabs/ day, Disp: 155 tablet, Rfl: 0 .  telmisartan (MICARDIS) 40 MG tablet, Take 1 tablet  (40 mg total) by mouth daily., Disp: 90 tablet, Rfl: 0 .  zolpidem (AMBIEN) 10 MG tablet, Take 1 tablet (10 mg total) by mouth at bedtime., Disp: 30 tablet, Rfl: 2  Allergies  Allergen Reactions  . Ace Inhibitors Cough  . Diphenhydramine     stomach upset     Review of Systems  Constitutional: Negative for fever and chills.  Eyes: Negative for blurred vision.  Cardiovascular: Negative for chest pain and palpitations.  Gastrointestinal: Negative for abdominal pain.  Neurological: Negative for headaches.  Psychiatric/Behavioral: Positive for sleep disturbance. The patient has insomnia.     Objective  Filed Vitals:   11/07/15 1515  BP: 128/62  Pulse: 63  Temp: 98.1 F (36.7 C)  TempSrc: Oral  Resp: 17  Height: 5\' 10"  (1.778 m)  Weight: 228 lb 8 oz (103.647 kg)  SpO2: 94%    Physical Exam  Constitutional: He is oriented to person, place, and time and well-developed, well-nourished, and in no distress.  HENT:  Head: Normocephalic and atraumatic.  Eyes: Pupils are equal, round, and reactive to light.  Cardiovascular: Normal rate and regular rhythm.   Pulmonary/Chest: Effort normal and breath sounds normal.  Abdominal: Soft. Bowel sounds are normal.  Musculoskeletal: He exhibits no edema.  Neurological: He is alert and oriented to person, place, and time.  Skin: Skin is warm and dry.  Psychiatric: Mood, memory, affect and judgment normal.  Nursing note and vitals reviewed.   Assessment & Plan  1. Essential hypertension  - telmisartan (MICARDIS) 40 MG tablet; Take 1 tablet (40 mg total) by mouth daily.  Dispense: 90 tablet; Refill: 0  2. Type 2 diabetes mellitus without complication, without long-term current use of insulin (HCC)  - POCT HgB A1C - POCT Glucose (CBG) - Microalbumin / creatinine urine ratio - Comprehensive Metabolic Panel (CMET)  3. Hypertriglyceridemia  - Lipid Profile  4. Cannot sleep  - zolpidem (AMBIEN) 10 MG tablet; Take 1 tablet (10 mg  total) by mouth at bedtime.  Dispense: 30 tablet; Refill: 2  5. Dyslipidemia  - Comprehensive Metabolic Panel (CMET) - Lipid Profile   Chellsie Gomer Asad A. [facility] 11/07/2015 3:23 PM

## 2015-11-10 DIAGNOSIS — E785 Hyperlipidemia, unspecified: Secondary | ICD-10-CM | POA: Diagnosis not present

## 2015-11-10 DIAGNOSIS — E119 Type 2 diabetes mellitus without complications: Secondary | ICD-10-CM | POA: Diagnosis not present

## 2015-11-10 DIAGNOSIS — E781 Pure hyperglyceridemia: Secondary | ICD-10-CM | POA: Diagnosis not present

## 2015-11-11 LAB — COMPREHENSIVE METABOLIC PANEL
A/G RATIO: 1.9 (ref 1.1–2.5)
ALBUMIN: 4.5 g/dL (ref 3.6–4.8)
ALT: 47 IU/L — ABNORMAL HIGH (ref 0–44)
AST: 28 IU/L (ref 0–40)
Alkaline Phosphatase: 47 IU/L (ref 39–117)
BILIRUBIN TOTAL: 0.4 mg/dL (ref 0.0–1.2)
BUN / CREAT RATIO: 18 (ref 10–22)
BUN: 17 mg/dL (ref 8–27)
CHLORIDE: 98 mmol/L (ref 96–106)
CO2: 26 mmol/L (ref 18–29)
Calcium: 9.4 mg/dL (ref 8.6–10.2)
Creatinine, Ser: 0.97 mg/dL (ref 0.76–1.27)
GFR, EST AFRICAN AMERICAN: 92 mL/min/{1.73_m2} (ref >59)
GFR, EST NON AFRICAN AMERICAN: 79 mL/min/{1.73_m2} (ref >59)
GLOBULIN, TOTAL: 2.4 g/dL (ref 1.5–4.5)
Glucose: 104 mg/dL — ABNORMAL HIGH (ref 65–99)
POTASSIUM: 4.2 mmol/L (ref 3.5–5.2)
Sodium: 141 mmol/L (ref 134–144)
TOTAL PROTEIN: 6.9 g/dL (ref 6.0–8.5)

## 2015-11-11 LAB — LIPID PANEL
CHOL/HDL RATIO: 5 ratio (ref 0.0–5.0)
Cholesterol, Total: 130 mg/dL (ref 100–199)
HDL: 26 mg/dL — ABNORMAL LOW (ref >39)
LDL Calculated: 47 mg/dL (ref 0–99)
Triglycerides: 284 mg/dL — ABNORMAL HIGH (ref 0–149)
VLDL CHOLESTEROL CAL: 57 mg/dL — AB (ref 5–40)

## 2015-11-11 LAB — MICROALBUMIN / CREATININE URINE RATIO
Creatinine, Urine: 246 mg/dL
MICROALB/CREAT RATIO: 8 mg/g creat (ref 0.0–30.0)
Microalbumin, Urine: 19.8 ug/mL

## 2015-11-12 ENCOUNTER — Telehealth: Payer: Self-pay | Admitting: Family Medicine

## 2015-11-12 NOTE — Telephone Encounter (Signed)
NEED TEST RESULTS

## 2015-11-13 NOTE — Telephone Encounter (Signed)
Lab results have been reported to patient  

## 2015-12-04 ENCOUNTER — Telehealth: Payer: Self-pay | Admitting: Family Medicine

## 2015-12-04 NOTE — Telephone Encounter (Signed)
Pt requesting pain meds to be filled. °

## 2015-12-05 ENCOUNTER — Other Ambulatory Visit: Payer: Self-pay

## 2015-12-05 DIAGNOSIS — M542 Cervicalgia: Principal | ICD-10-CM

## 2015-12-05 DIAGNOSIS — G8929 Other chronic pain: Secondary | ICD-10-CM

## 2015-12-05 MED ORDER — OXYCODONE-ACETAMINOPHEN 10-325 MG PO TABS: 1.0000 | ORAL_TABLET | Freq: Four times a day (QID) | ORAL | Status: DC | PRN

## 2015-12-05 NOTE — Telephone Encounter (Signed)
Patient will be completely out of his pain medication by tomorrow and would like to pick them up today

## 2015-12-05 NOTE — Telephone Encounter (Signed)
Routed to Dr. Shah for approval 

## 2015-12-08 ENCOUNTER — Telehealth: Payer: Self-pay | Admitting: Family Medicine

## 2015-12-08 ENCOUNTER — Other Ambulatory Visit: Payer: Self-pay | Admitting: Family Medicine

## 2015-12-08 NOTE — Telephone Encounter (Signed)
Prescription is ready for patient pick up he has been notified

## 2015-12-08 NOTE — Telephone Encounter (Signed)
Needs his nerve clorazapam refilled. You all filled his pain meds on Friday but did not do this one,.

## 2015-12-12 ENCOUNTER — Ambulatory Visit: Payer: Medicare Other | Admitting: Family Medicine

## 2015-12-15 ENCOUNTER — Other Ambulatory Visit: Payer: Self-pay | Admitting: Family Medicine

## 2015-12-23 ENCOUNTER — Ambulatory Visit: Payer: Medicare Other | Admitting: Family Medicine

## 2015-12-26 ENCOUNTER — Other Ambulatory Visit: Payer: Self-pay | Admitting: Family Medicine

## 2015-12-29 ENCOUNTER — Other Ambulatory Visit: Payer: Self-pay | Admitting: Family Medicine

## 2015-12-30 ENCOUNTER — Ambulatory Visit (INDEPENDENT_AMBULATORY_CARE_PROVIDER_SITE_OTHER): Payer: Medicare Other | Admitting: Family Medicine

## 2015-12-30 ENCOUNTER — Encounter: Payer: Self-pay | Admitting: Family Medicine

## 2015-12-30 VITALS — BP 124/72 | HR 70 | Temp 98.0°F | Resp 15 | Ht 70.0 in | Wt 224.7 lb

## 2015-12-30 DIAGNOSIS — M542 Cervicalgia: Secondary | ICD-10-CM

## 2015-12-30 DIAGNOSIS — G8929 Other chronic pain: Secondary | ICD-10-CM

## 2015-12-30 DIAGNOSIS — F419 Anxiety disorder, unspecified: Secondary | ICD-10-CM

## 2015-12-30 MED ORDER — OXYCODONE-ACETAMINOPHEN 10-325 MG PO TABS: 1.0000 | ORAL_TABLET | Freq: Four times a day (QID) | ORAL | Status: DC | PRN

## 2015-12-30 MED ORDER — CLONAZEPAM 1 MG PO TABS: 1.0000 mg | ORAL_TABLET | Freq: Three times a day (TID) | ORAL | Status: DC | PRN

## 2015-12-30 NOTE — Progress Notes (Signed)
Name: Juan Le   MRN: [id number]    DOB: [birth year]   Date:12/30/2015       Progress Note  Subjective  Chief Complaint  Chief Complaint  Patient presents with  . Follow-up    1 mo  . Medication Refill    HPI  Chronic Neck Pain: Today 2/10 as long as he is on medication, worse with activity (bending over, twisting) etc. Takes Percocet 10-325 mg upto 5 tablets daily, with good pain relief.   Anxiety: Symptoms include nervous, anxious, does not worry a lot. Takes Clonazepam 1 mg three times daily PRN, with good relief of symptoms.  Past Medical History  Diagnosis Date  . Diabetes ([place])   . Hypertriglyceridemia   . Hypokalemia   . Vitamin D deficiency   . Chronic, continuous use of opioids   . Anxiety   . Insomnia   . Hypertension   . GERD (gastroesophageal reflux disease)   . History of diverticulitis   . Mitral regurgitation   . DJD (degenerative joint disease)   . Hypertension     Past Surgical History  Procedure Laterality Date  . Umbilical hernia repair  04/2012  . Coronary stent placement  11/2006  . Coronary artery bypass graft  1997  . Foot surgery  2003    Family History  Problem Relation Age of Onset  . Throat cancer Father   . Stroke Brother   . Stroke Mother   . Breast cancer Paternal Aunt   . Colon cancer Father   . Kidney disease Mother   . Heart disease Mother     Social History   Social History  . Marital Status: Married    Spouse Name: N/A  . Number of Children: 0  . Years of Education: N/A   Occupational History  . Not on file.   Social History Main Topics  . Smoking status: Former Research scientist (life sciences)  . Smokeless tobacco: Not on file  . Alcohol Use: No  . Drug Use: No  . Sexual Activity:    Partners: Female   Other Topics Concern  . Not on file   Social History Narrative     Current outpatient prescriptions:  .  aspirin 81 MG tablet, Take 1 tablet by mouth daily., Disp: , Rfl:  .  atorvastatin (LIPITOR) 40 MG tablet, Take  1 tablet (40 mg total) by mouth daily at 6 PM., Disp: 90 tablet, Rfl: 0 .  clonazePAM (KLONOPIN) 1 MG tablet, TAKE ONE TABLET THREE TIMES A DAY IF NEEDED FOR ANXIETY., Disp: 60 tablet, Rfl: 0 .  cloNIDine (CATAPRES) 0.1 MG tablet, Take 1 tablet (0.1 mg total) by mouth 2 (two) times daily., Disp: 60 tablet, Rfl: 2 .  ergocalciferol (VITAMIN D2) 50000 UNITS capsule, Take 1 capsule by mouth once a week., Disp: , Rfl:  .  glucose blood test strip, Use as instructed, Disp: 50 each, Rfl: 12 .  Icosapent Ethyl (VASCEPA) 1 G CAPS, Take 2 g by mouth 2 (two) times daily with a meal., Disp: 120 capsule, Rfl: 0 .  metFORMIN (GLUCOPHAGE-XR) 500 MG 24 hr tablet, Take 1 tablet (500 mg total) by mouth 2 (two) times daily., Disp: 180 tablet, Rfl: 0 .  omeprazole (PRILOSEC) 40 MG capsule, Take 1 capsule (40 mg total) by mouth daily., Disp: 90 capsule, Rfl: 0 .  oxyCODONE-acetaminophen (PERCOCET) 10-325 MG tablet, Take 1 tablet by mouth every 6 (six) hours as needed. Pt. Takes 5 tabs/ day, Disp: 155 tablet, Rfl: 0 .  telmisartan (MICARDIS) 40 MG tablet, Take 1 tablet (40 mg total) by mouth daily., Disp: 90 tablet, Rfl: 0 .  zolpidem (AMBIEN) 10 MG tablet, Take 1 tablet (10 mg total) by mouth at bedtime., Disp: 30 tablet, Rfl: 2  Allergies  Allergen Reactions  . Ace Inhibitors Cough  . Diphenhydramine     stomach upset     Review of Systems  Musculoskeletal: Positive for neck pain.  Psychiatric/Behavioral: The patient is nervous/anxious.       Objective  Filed Vitals:   12/30/15 1046  BP: 124/72  Pulse: 70  Temp: 98 F (36.7 C)  TempSrc: Oral  Resp: 15  Height: 5\' 10"  (1.778 m)  Weight: 224 lb 11.2 oz (101.923 kg)  SpO2: 97%    Physical Exam  Constitutional: He is well-developed, well-nourished, and in no distress.  Cardiovascular: Normal rate and regular rhythm.   Pulmonary/Chest: Effort normal and breath sounds normal.  Musculoskeletal:       Cervical back: He exhibits tenderness, pain  and spasm.       Back:  Nursing note and vitals reviewed.    Assessment & Plan  1. Chronic neck pain Responsive to opioid therapy. Patient compliant with controlled substances agreement. Refills provided. - oxyCODONE-acetaminophen (PERCOCET) 10-325 MG tablet; Take 1 tablet by mouth every 6 (six) hours as needed. Pt. Takes 5 tabs/ day  Dispense: 155 tablet; Refill: 0  2. Anxiety Responsive to clonazepam. Refills provided. - clonazePAM (KLONOPIN) 1 MG tablet; Take 1 tablet (1 mg total) by mouth 3 (three) times daily as needed for anxiety.  Dispense: 90 tablet; Refill: 0   Damar Petit Asad A. [facility] 12/30/2015 11:31 AM

## 2016-01-09 ENCOUNTER — Ambulatory Visit (INDEPENDENT_AMBULATORY_CARE_PROVIDER_SITE_OTHER): Payer: Medicare Other | Admitting: Family Medicine

## 2016-01-09 ENCOUNTER — Telehealth: Payer: Self-pay | Admitting: Family Medicine

## 2016-01-09 ENCOUNTER — Encounter: Payer: Self-pay | Admitting: Family Medicine

## 2016-01-09 VITALS — BP 112/70 | HR 60 | Temp 98.7°F | Resp 18 | Ht 70.0 in | Wt 221.6 lb

## 2016-01-09 DIAGNOSIS — E785 Hyperlipidemia, unspecified: Secondary | ICD-10-CM

## 2016-01-09 DIAGNOSIS — G4700 Insomnia, unspecified: Secondary | ICD-10-CM | POA: Diagnosis not present

## 2016-01-09 DIAGNOSIS — K219 Gastro-esophageal reflux disease without esophagitis: Secondary | ICD-10-CM | POA: Diagnosis not present

## 2016-01-09 MED ORDER — OMEPRAZOLE 40 MG PO CPDR: 40.0000 mg | DELAYED_RELEASE_CAPSULE | Freq: Every day | ORAL | Status: DC

## 2016-01-09 MED ORDER — ZOLPIDEM TARTRATE 10 MG PO TABS: 10.0000 mg | ORAL_TABLET | Freq: Every day | ORAL | Status: DC

## 2016-01-09 MED ORDER — ATORVASTATIN CALCIUM 40 MG PO TABS: 40.0000 mg | ORAL_TABLET | Freq: Every day | ORAL | Status: DC

## 2016-01-09 NOTE — Progress Notes (Signed)
Name: Juan Le   MRN: [id number]    DOB: 1945-10-27   Date:01/09/2016       Progress Note  Subjective  Chief Complaint  Chief Complaint  Patient presents with  . Gastroesophageal Reflux    med refill  . Insomnia    med refill  . Hyperlipidemia    HPI  Hyperlipidemia: FLP obtained in January 2016 showed elevated Triglycerides, normal total cholesterol and LDL. Pt. Takes Atorvastatin 40 mg at bedtime and does fine, no myalgias or other side effects.  Insomnia: Typical bedtime is 11-12 PM, usually falls asleep within 30 minutes. Takes Ambien 10 mg nightly. No side effects reported.   GERD: Has history of acid reflux (no symptoms while he is on medication). In the past, has experienced shortness of breath with certain foods.  No nausea, vomiting, heartburn, weight loss, melena, or other concerning symptoms.    Past Medical History  Diagnosis Date  . Diabetes ([place])   . Hypertriglyceridemia   . Hypokalemia   . Vitamin D deficiency   . Chronic, continuous use of opioids   . Anxiety   . Insomnia   . Hypertension   . GERD (gastroesophageal reflux disease)   . History of diverticulitis   . Mitral regurgitation   . DJD (degenerative joint disease)   . Hypertension     Past Surgical History  Procedure Laterality Date  . Umbilical hernia repair  04/2012  . Coronary stent placement  11/2006  . Coronary artery bypass graft  1997  . Foot surgery  2003    Family History  Problem Relation Age of Onset  . Throat cancer Father   . Stroke Brother   . Stroke Mother   . Breast cancer Paternal Aunt   . Colon cancer Father   . Kidney disease Mother   . Heart disease Mother     Social History   Social History  . Marital Status: Married    Spouse Name: N/A  . Number of Children: 0  . Years of Education: N/A   Occupational History  . Not on file.   Social History Main Topics  . Smoking status: Former Research scientist (life sciences)  . Smokeless tobacco: Not on file  . Alcohol Use: No   . Drug Use: No  . Sexual Activity:    Partners: Female   Other Topics Concern  . Not on file   Social History Narrative     Current outpatient prescriptions:  .  aspirin 81 MG tablet, Take 1 tablet by mouth daily., Disp: , Rfl:  .  atorvastatin (LIPITOR) 40 MG tablet, Take 1 tablet (40 mg total) by mouth daily at 6 PM., Disp: 90 tablet, Rfl: 0 .  clonazePAM (KLONOPIN) 1 MG tablet, Take 1 tablet (1 mg total) by mouth 3 (three) times daily as needed for anxiety., Disp: 90 tablet, Rfl: 0 .  cloNIDine (CATAPRES) 0.1 MG tablet, Take 1 tablet (0.1 mg total) by mouth 2 (two) times daily., Disp: 60 tablet, Rfl: 2 .  ergocalciferol (VITAMIN D2) 50000 UNITS capsule, Take 1 capsule by mouth once a week., Disp: , Rfl:  .  glucose blood test strip, Use as instructed, Disp: 50 each, Rfl: 12 .  Icosapent Ethyl (VASCEPA) 1 G CAPS, Take 2 g by mouth 2 (two) times daily with a meal., Disp: 120 capsule, Rfl: 0 .  metFORMIN (GLUCOPHAGE-XR) 500 MG 24 hr tablet, Take 1 tablet (500 mg total) by mouth 2 (two) times daily., Disp: 180 tablet, Rfl: 0 .  omeprazole (PRILOSEC) 40 MG capsule, Take 1 capsule (40 mg total) by mouth daily., Disp: 90 capsule, Rfl: 0 .  oxyCODONE-acetaminophen (PERCOCET) 10-325 MG tablet, Take 1 tablet by mouth every 6 (six) hours as needed. Pt. Takes 5 tabs/ day, Disp: 155 tablet, Rfl: 0 .  telmisartan (MICARDIS) 40 MG tablet, Take 1 tablet (40 mg total) by mouth daily., Disp: 90 tablet, Rfl: 0 .  zolpidem (AMBIEN) 10 MG tablet, Take 1 tablet (10 mg total) by mouth at bedtime., Disp: 30 tablet, Rfl: 2  Allergies  Allergen Reactions  . Ace Inhibitors Cough  . Diphenhydramine     stomach upset     Review of Systems  Constitutional: Negative for weight loss.  Respiratory: Negative for shortness of breath.   Cardiovascular: Negative for chest pain.  Gastrointestinal: Negative for heartburn, nausea, vomiting, abdominal pain, blood in stool and melena.  Musculoskeletal: Negative for  myalgias.  Psychiatric/Behavioral: The patient is nervous/anxious and has insomnia.     Objective  Filed Vitals:   01/09/16 0950  BP: 112/70  Pulse: 60  Temp: 98.7 F (37.1 C)  TempSrc: Oral  Resp: 18  Height: 5\' 10"  (1.778 m)  Weight: 221 lb 9.6 oz (100.517 kg)  SpO2: 97%    Physical Exam  Constitutional: He is oriented to person, place, and time and well-developed, well-nourished, and in no distress.  Cardiovascular: Normal rate, regular rhythm and normal heart sounds.   Pulmonary/Chest: Effort normal and breath sounds normal.  Abdominal: Soft. Bowel sounds are normal.  Neurological: He is alert and oriented to person, place, and time.  Psychiatric: Mood, memory, affect and judgment normal.  Nursing note and vitals reviewed.     Assessment & Plan  1. Cannot sleep Stable and responsive to Ambien. Refills provided. - zolpidem (AMBIEN) 10 MG tablet; Take 1 tablet (10 mg total) by mouth at bedtime.  Dispense: 30 tablet; Refill: 2  2. Dyslipidemia  - atorvastatin (LIPITOR) 40 MG tablet; Take 1 tablet (40 mg total) by mouth daily at 6 PM.  Dispense: 90 tablet; Refill: 0  3. Gastroesophageal reflux disease, esophagitis presence not specified  - omeprazole (PRILOSEC) 40 MG capsule; Take 1 capsule (40 mg total) by mouth daily.  Dispense: 90 capsule; Refill: 0   Aryah Doering Asad A. [facility] 01/09/2016 10:01 AM

## 2016-01-09 NOTE — Telephone Encounter (Signed)
Medication refill for Telmisartan 40 mg has been refused due to a 90-day refilled was just filled on 12/15/2015 too soon for a refill patient has been notified

## 2016-01-09 NOTE — Telephone Encounter (Signed)
Patient was seen today but forgot to request a refill on Telmisartan 40mg . Please send to [facility]

## 2016-01-26 ENCOUNTER — Other Ambulatory Visit: Payer: Self-pay | Admitting: Family Medicine

## 2016-01-27 ENCOUNTER — Other Ambulatory Visit: Payer: Self-pay | Admitting: Family Medicine

## 2016-01-27 DIAGNOSIS — E119 Type 2 diabetes mellitus without complications: Secondary | ICD-10-CM

## 2016-01-27 DIAGNOSIS — G8929 Other chronic pain: Secondary | ICD-10-CM

## 2016-01-27 DIAGNOSIS — M542 Cervicalgia: Secondary | ICD-10-CM

## 2016-01-27 MED ORDER — METFORMIN HCL ER 500 MG PO TB24: 500.0000 mg | ORAL_TABLET | Freq: Two times a day (BID) | ORAL | Status: DC

## 2016-01-27 NOTE — Telephone Encounter (Signed)
Metformin 500 mg has been refilled and sent to [place], Oxycodone has been routed to Dr. Manuella Ghazi for approval

## 2016-01-27 NOTE — Telephone Encounter (Signed)
Requesting refill on Oxycodone and also please send Metformin to [facility]

## 2016-01-28 ENCOUNTER — Telehealth: Payer: Self-pay | Admitting: Family Medicine

## 2016-01-28 NOTE — Telephone Encounter (Signed)
Patient was informed that his prescription for oxycodone was refused because he need an appointment. Patient states that he already have appointment scheduled for 02-11-16 and states that the prescription will run out on the 14th. Please advise because you have no availability.

## 2016-01-28 NOTE — Telephone Encounter (Signed)
Spoke with patient and he has scheduled a medication refill appointment

## 2016-01-29 ENCOUNTER — Ambulatory Visit: Payer: Medicare Other | Admitting: Family Medicine

## 2016-02-05 ENCOUNTER — Encounter: Payer: Self-pay | Admitting: Family Medicine

## 2016-02-05 ENCOUNTER — Ambulatory Visit (INDEPENDENT_AMBULATORY_CARE_PROVIDER_SITE_OTHER): Payer: Medicare Other | Admitting: Family Medicine

## 2016-02-05 VITALS — BP 111/68 | HR 73 | Temp 98.9°F | Resp 15 | Ht 70.0 in | Wt 220.0 lb

## 2016-02-05 DIAGNOSIS — F419 Anxiety disorder, unspecified: Secondary | ICD-10-CM

## 2016-02-05 DIAGNOSIS — E785 Hyperlipidemia, unspecified: Secondary | ICD-10-CM

## 2016-02-05 DIAGNOSIS — E119 Type 2 diabetes mellitus without complications: Secondary | ICD-10-CM | POA: Diagnosis not present

## 2016-02-05 DIAGNOSIS — G8929 Other chronic pain: Secondary | ICD-10-CM | POA: Diagnosis not present

## 2016-02-05 DIAGNOSIS — M542 Cervicalgia: Secondary | ICD-10-CM | POA: Diagnosis not present

## 2016-02-05 DIAGNOSIS — R748 Abnormal levels of other serum enzymes: Secondary | ICD-10-CM | POA: Insufficient documentation

## 2016-02-05 LAB — GLUCOSE, POCT (MANUAL RESULT ENTRY): POC GLUCOSE: 124 mg/dL — AB (ref 70–99)

## 2016-02-05 LAB — POCT GLYCOSYLATED HEMOGLOBIN (HGB A1C): HEMOGLOBIN A1C: 5.6

## 2016-02-05 MED ORDER — CLONAZEPAM 1 MG PO TABS: 1.0000 mg | ORAL_TABLET | Freq: Three times a day (TID) | ORAL | Status: DC | PRN

## 2016-02-05 MED ORDER — OXYCODONE-ACETAMINOPHEN 10-325 MG PO TABS: 1.0000 | ORAL_TABLET | Freq: Four times a day (QID) | ORAL | Status: DC | PRN

## 2016-02-05 NOTE — Progress Notes (Signed)
Name: Juan Le   MRN: [id number]    DOB: 22-Oct-1945   Date:02/05/2016       Progress Note  Subjective  Chief Complaint  Chief Complaint  Patient presents with  . Medication Refill    clonazepam 1 mg / oxycodone     Neck Pain  This is a chronic problem. The problem has been unchanged. Associated with: pt. has history of degenerative disc disease in his neck. The pain is at a severity of 2/10 (as long as he is taking medication, it is controlled.). The pain is mild. The symptoms are aggravated by position and bending. Pertinent negatives include no fever. He has tried oral narcotics for the symptoms. The treatment provided significant relief.  Anxiety Presents for follow-up visit. The problem has been gradually worsening. Symptoms include excessive worry (worrying about his brother who is sick and in ICU), insomnia and nervous/anxious behavior. Patient reports no irritability. The severity of symptoms is moderate.   Past treatments include benzodiazephines. The treatment provided significant relief. Compliance with prior treatments has been good (Clonazepam was printed for him and left at front desk. He could not come to pick up becasue on vacation. Now requesting a reprint of prescription.).  Diabetes He presents for his follow-up diabetic visit. He has type 2 diabetes mellitus. His disease course has been stable. Hypoglycemia symptoms include nervousness/anxiousness. Current diabetic treatment includes oral agent (monotherapy). He monitors blood glucose at home 3-4 x per week. His breakfast blood glucose range is generally 110-130 mg/dl. An ACE inhibitor/angiotensin II receptor blocker is being taken.     Past Medical History  Diagnosis Date  . Diabetes ([place])   . Hypertriglyceridemia   . Hypokalemia   . Vitamin D deficiency   . Chronic, continuous use of opioids   . Anxiety   . Insomnia   . Hypertension   . GERD (gastroesophageal reflux disease)   . History of  diverticulitis   . Mitral regurgitation   . DJD (degenerative joint disease)   . Hypertension     Past Surgical History  Procedure Laterality Date  . Umbilical hernia repair  04/2012  . Coronary stent placement  11/2006  . Coronary artery bypass graft  1997  . Foot surgery  2003    Family History  Problem Relation Age of Onset  . Throat cancer Father   . Stroke Brother   . Stroke Mother   . Breast cancer Paternal Aunt   . Colon cancer Father   . Kidney disease Mother   . Heart disease Mother     Social History   Social History  . Marital Status: Married    Spouse Name: N/A  . Number of Children: 0  . Years of Education: N/A   Occupational History  . Not on file.   Social History Main Topics  . Smoking status: Former Research scientist (life sciences)  . Smokeless tobacco: Not on file  . Alcohol Use: No  . Drug Use: No  . Sexual Activity:    Partners: Female   Other Topics Concern  . Not on file   Social History Narrative     Current outpatient prescriptions:  .  aspirin 81 MG tablet, Take 1 tablet by mouth daily., Disp: , Rfl:  .  atorvastatin (LIPITOR) 40 MG tablet, Take 1 tablet (40 mg total) by mouth daily at 6 PM., Disp: 90 tablet, Rfl: 0 .  clonazePAM (KLONOPIN) 1 MG tablet, TAKE ONE TABLET THREE TIMES A DAY IF NEEDED FOR ANXIETY., Disp:  90 tablet, Rfl: 0 .  cloNIDine (CATAPRES) 0.1 MG tablet, TAKE (1) TABLET TWICE A DAY., Disp: 60 tablet, Rfl: 2 .  ergocalciferol (VITAMIN D2) 50000 UNITS capsule, Take 1 capsule by mouth once a week., Disp: , Rfl:  .  glucose blood test strip, Use as instructed, Disp: 50 each, Rfl: 12 .  Icosapent Ethyl (VASCEPA) 1 G CAPS, Take 2 g by mouth 2 (two) times daily with a meal., Disp: 120 capsule, Rfl: 0 .  metFORMIN (GLUCOPHAGE-XR) 500 MG 24 hr tablet, Take 1 tablet (500 mg total) by mouth 2 (two) times daily., Disp: 180 tablet, Rfl: 0 .  omeprazole (PRILOSEC) 40 MG capsule, Take 1 capsule (40 mg total) by mouth daily., Disp: 90 capsule, Rfl: 0 .   oxyCODONE-acetaminophen (PERCOCET) 10-325 MG tablet, Take 1 tablet by mouth every 6 (six) hours as needed. Pt. Takes 5 tabs/ day, Disp: 155 tablet, Rfl: 0 .  telmisartan (MICARDIS) 40 MG tablet, Take 1 tablet (40 mg total) by mouth daily., Disp: 90 tablet, Rfl: 0 .  zolpidem (AMBIEN) 10 MG tablet, Take 1 tablet (10 mg total) by mouth at bedtime., Disp: 30 tablet, Rfl: 2  Allergies  Allergen Reactions  . Ace Inhibitors Cough  . Diphenhydramine     stomach upset     Review of Systems  Constitutional: Negative for fever, chills, malaise/fatigue and irritability.  Gastrointestinal: Negative for abdominal pain.  Musculoskeletal: Positive for neck pain.  Psychiatric/Behavioral: The patient is nervous/anxious and has insomnia.     Objective  Filed Vitals:   02/05/16 0929  BP: 111/68  Pulse: 73  Temp: 98.9 F (37.2 C)  TempSrc: Oral  Resp: 15  Height: 5\' 10"  (1.778 m)  Weight: 220 lb (99.791 kg)  SpO2: 98%    Physical Exam  Constitutional: He is oriented to person, place, and time and well-developed, well-nourished, and in no distress.  Cardiovascular: Normal rate and regular rhythm.   Pulmonary/Chest: Effort normal and breath sounds normal.  Musculoskeletal:       Cervical back: He exhibits tenderness, pain and spasm.  Neurological: He is alert and oriented to person, place, and time.  Psychiatric: Memory and affect normal. His mood appears anxious.  Nursing note and vitals reviewed.     Assessment & Plan  1. Chronic neck pain Stable on chronic opioid therapy, obtain urine drug screen as part of controlled substances agreement - oxyCODONE-acetaminophen (PERCOCET) 10-325 MG tablet; Take 1 tablet by mouth every 6 (six) hours as needed. Pt. Takes 5 tabs/ day  Dispense: 155 tablet; Refill: 0 - Drug Screen 12+Alcohol+CRT, Ur  2. Anxiety Stable overall but worse recently because of brother's worsening health, continue on clonazepam, aware of the drug interactions between  benzodiazepines and opioids. Refills provided - clonazePAM (KLONOPIN) 1 MG tablet; Take 1 tablet (1 mg total) by mouth 3 (three) times daily as needed for anxiety.  Dispense: 90 tablet; Refill: 0  3. Type 2 diabetes mellitus without complication, without long-term current use of insulin (HCC) A1c is 5.6%, stable and well controlled. No change in therapy - POCT HgB A1C - POCT Glucose (CBG)  4. Dyslipidemia Elevated triglycerides, below normal HDL. Continue on statin therapy. Recheck FLP today. - Lipid Profile  5. Elevated liver enzymes Repeat ALT levels. - Hepatic function panel   Hanson Medeiros Asad A. [facility] 02/05/2016 9:46 AM

## 2016-02-11 ENCOUNTER — Ambulatory Visit: Payer: Medicare Other | Admitting: Family Medicine

## 2016-02-11 LAB — DRUG SCREEN 12+ALCOHOL+CRT, UR
AMPHETAMINES, URINE: NEGATIVE ng/mL
BARBITURATE: NEGATIVE ng/mL
BENZODIAZ UR QL: POSITIVE ng/mL — AB
COCAINE (METABOLITE): NEGATIVE ng/mL
Cannabinoids: NEGATIVE ng/mL
Creatinine, Urine: 243.8 mg/dL (ref 20.0–300.0)
Ethanol U, Quan: NEGATIVE %
MEPERIDINE: NEGATIVE ng/mL
Methadone: NEGATIVE ng/mL
OPIATE SCREEN URINE: NEGATIVE ng/mL
Oxycodone/Oxymorphone, Urine: POSITIVE — AB
Phencyclidine: NEGATIVE ng/mL
Propoxyphene: NEGATIVE ng/mL
TRAMADOL: NEGATIVE ng/mL

## 2016-02-11 LAB — LIPID PANEL
CHOL/HDL RATIO: 4 ratio (ref 0.0–5.0)
CHOLESTEROL TOTAL: 143 mg/dL (ref 100–199)
HDL: 36 mg/dL — ABNORMAL LOW (ref >39)
LDL CALC: 66 mg/dL (ref 0–99)
TRIGLYCERIDES: 204 mg/dL — AB (ref 0–149)
VLDL CHOLESTEROL CAL: 41 mg/dL — AB (ref 5–40)

## 2016-02-11 LAB — HEPATIC FUNCTION PANEL
ALT: 67 IU/L — AB (ref 0–44)
AST: 33 IU/L (ref 0–40)
Albumin: 4.9 g/dL — ABNORMAL HIGH (ref 3.6–4.8)
Alkaline Phosphatase: 54 IU/L (ref 39–117)
Bilirubin Total: 0.4 mg/dL (ref 0.0–1.2)
Bilirubin, Direct: 0.13 mg/dL (ref 0.00–0.40)
Total Protein: 7.5 g/dL (ref 6.0–8.5)

## 2016-02-18 ENCOUNTER — Telehealth: Payer: Self-pay | Admitting: Family Medicine

## 2016-02-18 NOTE — Telephone Encounter (Signed)
Pt needs results of his lab work.

## 2016-02-18 NOTE — Telephone Encounter (Signed)
Called pt and left vm to return my call

## 2016-03-05 ENCOUNTER — Encounter: Payer: Self-pay | Admitting: Family Medicine

## 2016-03-05 ENCOUNTER — Ambulatory Visit (INDEPENDENT_AMBULATORY_CARE_PROVIDER_SITE_OTHER): Payer: Medicare Other | Admitting: Family Medicine

## 2016-03-05 VITALS — BP 112/70 | HR 80 | Temp 98.8°F | Resp 17 | Ht 70.0 in | Wt 221.9 lb

## 2016-03-05 DIAGNOSIS — M542 Cervicalgia: Secondary | ICD-10-CM | POA: Diagnosis not present

## 2016-03-05 DIAGNOSIS — I10 Essential (primary) hypertension: Secondary | ICD-10-CM

## 2016-03-05 DIAGNOSIS — K219 Gastro-esophageal reflux disease without esophagitis: Secondary | ICD-10-CM

## 2016-03-05 DIAGNOSIS — G8929 Other chronic pain: Secondary | ICD-10-CM

## 2016-03-05 MED ORDER — OMEPRAZOLE 40 MG PO CPDR: 40.0000 mg | DELAYED_RELEASE_CAPSULE | Freq: Every day | ORAL | Status: DC

## 2016-03-05 MED ORDER — OXYCODONE-ACETAMINOPHEN 10-325 MG PO TABS: 1.0000 | ORAL_TABLET | Freq: Four times a day (QID) | ORAL | Status: DC | PRN

## 2016-03-05 MED ORDER — CLONIDINE HCL 0.1 MG PO TABS: 0.1000 mg | ORAL_TABLET | Freq: Two times a day (BID) | ORAL | Status: DC

## 2016-03-05 NOTE — Progress Notes (Signed)
Name: Juan Le   MRN: [id number]    DOB: Feb 27, 1946   Date:03/05/2016       Progress Note  Subjective  Chief Complaint  Chief Complaint  Patient presents with  . Follow-up    1 mo  . Medication Refill    Hypertension This is a chronic problem. The problem is controlled. Associated symptoms include neck pain. Pertinent negatives include no blurred vision, chest pain, headaches or palpitations. Past treatments include central alpha agonists. There is no history of kidney disease, CAD/MI or CVA.  Gastroesophageal Reflux He reports no abdominal pain, no chest pain, no heartburn or no nausea. This is a chronic problem. The symptoms are aggravated by certain foods (pasta, spaghetti, red suaces, pizza etc.). He has tried a PPI for the symptoms. The treatment provided significant relief.  Neck Pain  This is a chronic problem. The problem has been unchanged. The pain is at a severity of 2/10. The symptoms are aggravated by bending and position. Pertinent negatives include no chest pain, fever or headaches. He has tried oral narcotics for the symptoms. The treatment provided significant relief.     Past Medical History  Diagnosis Date  . Diabetes ([place])   . Hypertriglyceridemia   . Hypokalemia   . Vitamin D deficiency   . Chronic, continuous use of opioids   . Anxiety   . Insomnia   . Hypertension   . GERD (gastroesophageal reflux disease)   . History of diverticulitis   . Mitral regurgitation   . DJD (degenerative joint disease)   . Hypertension     Past Surgical History  Procedure Laterality Date  . Umbilical hernia repair  04/2012  . Coronary stent placement  11/2006  . Coronary artery bypass graft  1997  . Foot surgery  2003    Family History  Problem Relation Age of Onset  . Throat cancer Father   . Stroke Brother   . Stroke Mother   . Breast cancer Paternal Aunt   . Colon cancer Father   . Kidney disease Mother   . Heart disease Mother     Social History    Social History  . Marital Status: Married    Spouse Name: N/A  . Number of Children: 0  . Years of Education: N/A   Occupational History  . Not on file.   Social History Main Topics  . Smoking status: Former Research scientist (life sciences)  . Smokeless tobacco: Not on file  . Alcohol Use: No  . Drug Use: No  . Sexual Activity:    Partners: Female   Other Topics Concern  . Not on file   Social History Narrative     Current outpatient prescriptions:  .  aspirin 81 MG tablet, Take 1 tablet by mouth daily., Disp: , Rfl:  .  atorvastatin (LIPITOR) 40 MG tablet, Take 1 tablet (40 mg total) by mouth daily at 6 PM., Disp: 90 tablet, Rfl: 0 .  clonazePAM (KLONOPIN) 1 MG tablet, Take 1 tablet (1 mg total) by mouth 3 (three) times daily as needed for anxiety., Disp: 90 tablet, Rfl: 0 .  cloNIDine (CATAPRES) 0.1 MG tablet, TAKE (1) TABLET TWICE A DAY., Disp: 60 tablet, Rfl: 2 .  ergocalciferol (VITAMIN D2) 50000 UNITS capsule, Take 1 capsule by mouth once a week., Disp: , Rfl:  .  glucose blood test strip, Use as instructed, Disp: 50 each, Rfl: 12 .  Icosapent Ethyl (VASCEPA) 1 G CAPS, Take 2 g by mouth 2 (two) times  daily with a meal., Disp: 120 capsule, Rfl: 0 .  metFORMIN (GLUCOPHAGE-XR) 500 MG 24 hr tablet, Take 1 tablet (500 mg total) by mouth 2 (two) times daily., Disp: 180 tablet, Rfl: 0 .  omeprazole (PRILOSEC) 40 MG capsule, Take 1 capsule (40 mg total) by mouth daily., Disp: 90 capsule, Rfl: 0 .  oxyCODONE-acetaminophen (PERCOCET) 10-325 MG tablet, Take 1 tablet by mouth every 6 (six) hours as needed. Pt. Takes 5 tabs/ day, Disp: 155 tablet, Rfl: 0 .  telmisartan (MICARDIS) 40 MG tablet, Take 1 tablet (40 mg total) by mouth daily., Disp: 90 tablet, Rfl: 0 .  zolpidem (AMBIEN) 10 MG tablet, Take 1 tablet (10 mg total) by mouth at bedtime., Disp: 30 tablet, Rfl: 2  Allergies  Allergen Reactions  . Ace Inhibitors Cough  . Diphenhydramine     stomach upset     Review of Systems  Constitutional:  Negative for fever and chills.  Eyes: Negative for blurred vision.  Cardiovascular: Negative for chest pain and palpitations.  Gastrointestinal: Negative for heartburn, nausea and abdominal pain.  Musculoskeletal: Positive for neck pain. Negative for back pain.  Neurological: Negative for headaches.    Objective  Filed Vitals:   03/05/16 1112  BP: 112/70  Pulse: 80  Temp: 98.8 F (37.1 C)  TempSrc: Oral  Resp: 17  Height: 5\' 10"  (1.778 m)  Weight: 221 lb 14.4 oz (100.653 kg)  SpO2: 97%    Physical Exam  Constitutional: He is oriented to person, place, and time and well-developed, well-nourished, and in no distress.  HENT:  Head: Normocephalic and atraumatic.  Cardiovascular: Normal rate and regular rhythm.   Pulmonary/Chest: Effort normal and breath sounds normal.  Abdominal: Soft. Bowel sounds are normal.  Musculoskeletal:       Cervical back: He exhibits tenderness, pain and spasm.       Back:  Neurological: He is alert and oriented to person, place, and time.  Psychiatric: Mood, memory, affect and judgment normal.  Nursing note and vitals reviewed.    Assessment & Plan  1. Gastroesophageal reflux disease, esophagitis presence not specified Stable on PPI therapy. - omeprazole (PRILOSEC) 40 MG capsule; Take 1 capsule (40 mg total) by mouth daily.  Dispense: 90 capsule; Refill: 0  2. Essential hypertension BP controlled and at goal. Refills provided. - cloNIDine (CATAPRES) 0.1 MG tablet; Take 1 tablet (0.1 mg total) by mouth 2 (two) times daily.  Dispense: 180 tablet; Refill: 0  3. Chronic neck pain Pain responsive to Percocet taken up to 5 times daily, no side effects, refills provided. - oxyCODONE-acetaminophen (PERCOCET) 10-325 MG tablet; Take 1 tablet by mouth every 6 (six) hours as needed. Pt. Takes 5 tabs/ day  Dispense: 155 tablet; Refill: 0  Myrian Botello Asad A. [facility] 03/05/2016 11:16 AM

## 2016-04-05 ENCOUNTER — Ambulatory Visit (INDEPENDENT_AMBULATORY_CARE_PROVIDER_SITE_OTHER): Payer: Medicare Other | Admitting: Family Medicine

## 2016-04-05 ENCOUNTER — Encounter: Payer: Self-pay | Admitting: Family Medicine

## 2016-04-05 ENCOUNTER — Ambulatory Visit
Admission: RE | Admit: 2016-04-05 | Discharge: 2016-04-05 | Disposition: A | Discharge status: Home / Self Care | Payer: Medicare Other | Source: Ambulatory Visit | Attending: Family Medicine | Admitting: Family Medicine

## 2016-04-05 VITALS — BP 118/75 | HR 92 | Temp 99.2°F | Resp 17 | Ht 70.0 in | Wt 223.8 lb

## 2016-04-05 DIAGNOSIS — M542 Cervicalgia: Secondary | ICD-10-CM | POA: Diagnosis not present

## 2016-04-05 DIAGNOSIS — M19071 Primary osteoarthritis, right ankle and foot: Secondary | ICD-10-CM | POA: Diagnosis not present

## 2016-04-05 DIAGNOSIS — G8929 Other chronic pain: Secondary | ICD-10-CM

## 2016-04-05 DIAGNOSIS — F419 Anxiety disorder, unspecified: Secondary | ICD-10-CM

## 2016-04-05 DIAGNOSIS — E785 Hyperlipidemia, unspecified: Secondary | ICD-10-CM

## 2016-04-05 DIAGNOSIS — M79671 Pain in right foot: Secondary | ICD-10-CM

## 2016-04-05 DIAGNOSIS — I10 Essential (primary) hypertension: Secondary | ICD-10-CM

## 2016-04-05 DIAGNOSIS — G4700 Insomnia, unspecified: Secondary | ICD-10-CM

## 2016-04-05 MED ORDER — ZOLPIDEM TARTRATE 10 MG PO TABS: 10.0000 mg | ORAL_TABLET | Freq: Every day | ORAL | Status: DC

## 2016-04-05 MED ORDER — OXYCODONE-ACETAMINOPHEN 10-325 MG PO TABS: 1.0000 | ORAL_TABLET | Freq: Four times a day (QID) | ORAL | Status: DC | PRN

## 2016-04-05 MED ORDER — CLONAZEPAM 1 MG PO TABS: 1.0000 mg | ORAL_TABLET | Freq: Three times a day (TID) | ORAL | Status: DC | PRN

## 2016-04-05 MED ORDER — TELMISARTAN 40 MG PO TABS: 40.0000 mg | ORAL_TABLET | Freq: Every day | ORAL | Status: DC

## 2016-04-05 MED ORDER — ATORVASTATIN CALCIUM 40 MG PO TABS: 40.0000 mg | ORAL_TABLET | Freq: Every day | ORAL | Status: DC

## 2016-04-05 NOTE — Progress Notes (Signed)
Name: Juan Le   MRN: [id number]    DOB: 1946-06-14   Date:04/05/2016       Progress Note  Subjective  Chief Complaint  Chief Complaint  Patient presents with  . Follow-up    1 mo  . Medication Refill    oxycodone 10-325 mg / clonazepam 1 mg / lipitor 40 mg / ambien 10 mg / micadis 40 mg     Neck Pain  This is a chronic problem. The pain is at a severity of 2/10. The symptoms are aggravated by bending and position (Pain worse in the morning when he wakes up and bends over.). He has tried oral narcotics for the symptoms.  Anxiety Presents for follow-up visit. The problem has been unchanged. Symptoms include insomnia and nervous/anxious behavior. Patient reports no excessive worry, panic or shortness of breath. Primary symptoms comment: As long as he is on medication, he feels well.. Symptoms occur most days. The severity of symptoms is severe and causing significant distress.   Past treatments include benzodiazephines. The treatment provided significant relief. Compliance with prior treatments has been good.   Right Leg and Foot Pain: Started 2 weeks ago, no associated injury, reports pain starts at the top of hs foot, travels to his ankle and then up through his leg into his hip. Pain is worse, rated at 10/10 when it hits, otherwise no pain. Pain is reported as sharp. Pt. Reports no new numbness (has numbness in his feet for a long time, which has not changed). Pt. has not taken any medications for this problem.   Past Medical History  Diagnosis Date  . Diabetes ([place])   . Hypertriglyceridemia   . Hypokalemia   . Vitamin D deficiency   . Chronic, continuous use of opioids   . Anxiety   . Insomnia   . Hypertension   . GERD (gastroesophageal reflux disease)   . History of diverticulitis   . Mitral regurgitation   . DJD (degenerative joint disease)   . Hypertension     Past Surgical History  Procedure Laterality Date  . Umbilical hernia repair  04/2012  . Coronary  stent placement  11/2006  . Coronary artery bypass graft  1997  . Foot surgery  2003    Family History  Problem Relation Age of Onset  . Throat cancer Father   . Stroke Brother   . Stroke Mother   . Breast cancer Paternal Aunt   . Colon cancer Father   . Kidney disease Mother   . Heart disease Mother     Social History   Social History  . Marital Status: Married    Spouse Name: N/A  . Number of Children: 0  . Years of Education: N/A   Occupational History  . Not on file.   Social History Main Topics  . Smoking status: Former Research scientist (life sciences)  . Smokeless tobacco: Not on file  . Alcohol Use: No  . Drug Use: No  . Sexual Activity:    Partners: Female   Other Topics Concern  . Not on file   Social History Narrative     Current outpatient prescriptions:  .  aspirin 81 MG tablet, Take 1 tablet by mouth daily., Disp: , Rfl:  .  atorvastatin (LIPITOR) 40 MG tablet, Take 1 tablet (40 mg total) by mouth daily at 6 PM., Disp: 90 tablet, Rfl: 0 .  clonazePAM (KLONOPIN) 1 MG tablet, Take 1 tablet (1 mg total) by mouth 3 (three) times daily as  needed for anxiety., Disp: 90 tablet, Rfl: 0 .  cloNIDine (CATAPRES) 0.1 MG tablet, Take 1 tablet (0.1 mg total) by mouth 2 (two) times daily., Disp: 180 tablet, Rfl: 0 .  glucose blood test strip, Use as instructed, Disp: 50 each, Rfl: 12 .  Icosapent Ethyl (VASCEPA) 1 G CAPS, Take 2 g by mouth 2 (two) times daily with a meal., Disp: 120 capsule, Rfl: 0 .  metFORMIN (GLUCOPHAGE-XR) 500 MG 24 hr tablet, Take 1 tablet (500 mg total) by mouth 2 (two) times daily., Disp: 180 tablet, Rfl: 0 .  omeprazole (PRILOSEC) 40 MG capsule, Take 1 capsule (40 mg total) by mouth daily., Disp: 90 capsule, Rfl: 0 .  oxyCODONE-acetaminophen (PERCOCET) 10-325 MG tablet, Take 1 tablet by mouth every 6 (six) hours as needed. Pt. Takes 5 tabs/ day, Disp: 155 tablet, Rfl: 0 .  telmisartan (MICARDIS) 40 MG tablet, Take 1 tablet (40 mg total) by mouth daily., Disp: 90  tablet, Rfl: 0 .  zolpidem (AMBIEN) 10 MG tablet, Take 1 tablet (10 mg total) by mouth at bedtime., Disp: 30 tablet, Rfl: 2 .  ergocalciferol (VITAMIN D2) 50000 UNITS capsule, Take 1 capsule by mouth once a week. Reported on 04/05/2016, Disp: , Rfl:   Allergies  Allergen Reactions  . Ace Inhibitors Cough  . Diphenhydramine     stomach upset    Review of Systems  Respiratory: Negative for shortness of breath.   Musculoskeletal: Positive for myalgias and neck pain.  Psychiatric/Behavioral: Negative for depression. The patient is nervous/anxious and has insomnia.     Objective  Filed Vitals:   04/05/16 0859  BP: 118/75  Pulse: 92  Temp: 99.2 F (37.3 C)  TempSrc: Oral  Resp: 17  Height: 5\' 10"  (1.778 m)  Weight: 223 lb 12.8 oz (101.515 kg)  SpO2: 97%    Physical Exam  Constitutional: He is oriented to person, place, and time and well-developed, well-nourished, and in no distress.  Cardiovascular: Normal rate, regular rhythm, S1 normal, S2 normal and normal heart sounds.  Exam reveals no gallop.   Pulmonary/Chest: Effort normal and breath sounds normal. No respiratory distress. He has no decreased breath sounds. He has no wheezes. He has no rhonchi.  Musculoskeletal:       Right upper leg: He exhibits no bony tenderness and no swelling.       Right lower leg: He exhibits no tenderness.       Right foot: There is tenderness. There is normal range of motion, no swelling and no deformity.       Feet:  Neurological: He is alert and oriented to person, place, and time.  Psychiatric: Mood, memory, affect and judgment normal.  Nursing note and vitals reviewed.     Assessment & Plan  1. Anxiety Stable on clonazepam taken 3 times daily as needed - clonazePAM (KLONOPIN) 1 MG tablet; Take 1 tablet (1 mg total) by mouth 3 (three) times daily as needed for anxiety.  Dispense: 90 tablet; Refill: 0  2. Cannot sleep  - zolpidem (AMBIEN) 10 MG tablet; Take 1 tablet (10 mg total) by  mouth at bedtime.  Dispense: 30 tablet; Refill: 2  3. Chronic neck pain Stable on chronic opioid therapy, refills provided. Follow-up in one month - oxyCODONE-acetaminophen (PERCOCET) 10-325 MG tablet; Take 1 tablet by mouth every 6 (six) hours as needed. Pt. Takes 5 tabs/ day  Dispense: 155 tablet; Refill: 0  4. Dyslipidemia  - atorvastatin (LIPITOR) 40 MG tablet; Take 1 tablet (40  mg total) by mouth daily at 6 PM.  Dispense: 90 tablet; Refill: 0  5. Right foot pain Palpable tenderness, obtain x-ray to rule out potential etiologies including arthritis. - DG Foot Complete Right; Future  6. Essential hypertension  - telmisartan (MICARDIS) 40 MG tablet; Take 1 tablet (40 mg total) by mouth daily.  Dispense: 90 tablet; Refill: 0   Jodi Kappes [facility] 04/05/2016 9:16 AM

## 2016-04-12 ENCOUNTER — Telehealth: Payer: Self-pay | Admitting: Family Medicine

## 2016-04-12 NOTE — Telephone Encounter (Signed)
Routed to Dr. Manuella Ghazi for referral order

## 2016-04-12 NOTE — Telephone Encounter (Signed)
Received call that he was going to be referred to podiatry. He would like to see Dr Milinda Pointer [facility]) [phone]. This doctor had did surgery on his right toe a few years ago.

## 2016-04-16 ENCOUNTER — Other Ambulatory Visit: Payer: Self-pay | Admitting: Family Medicine

## 2016-04-16 DIAGNOSIS — M19071 Primary osteoarthritis, right ankle and foot: Secondary | ICD-10-CM | POA: Insufficient documentation

## 2016-04-16 NOTE — Telephone Encounter (Signed)
Referral has been entered for podiatry

## 2016-05-05 ENCOUNTER — Ambulatory Visit (INDEPENDENT_AMBULATORY_CARE_PROVIDER_SITE_OTHER): Payer: Medicare Other | Admitting: Podiatry

## 2016-05-05 ENCOUNTER — Encounter: Payer: Self-pay | Admitting: Podiatry

## 2016-05-05 ENCOUNTER — Ambulatory Visit (INDEPENDENT_AMBULATORY_CARE_PROVIDER_SITE_OTHER): Payer: Medicare Other

## 2016-05-05 VITALS — BP 144/89 | HR 65 | Resp 18

## 2016-05-05 DIAGNOSIS — R52 Pain, unspecified: Secondary | ICD-10-CM | POA: Diagnosis not present

## 2016-05-05 DIAGNOSIS — M5431 Sciatica, right side: Secondary | ICD-10-CM | POA: Diagnosis not present

## 2016-05-05 NOTE — Progress Notes (Signed)
Juan Le, presents today with a chief complaint of pain from the second toe that radiates proximally along his calf and his thigh into his buttocks and back. He states this is been going on now for quite some time and has become increasingly more painful. He recently saw his primary provider who recommended that he be followed by podiatrist after having radiographed his right foot which demonstrated osteoarthritis.  Objective: I have reviewed his past medical history medications allergies surgery social history and review of systems area it has been a while since he has been to [place] however he still has good range of motion of the first metatarsophalangeal joint and has no pain there he says. Vital signs are stable alert and oriented 3 pulses are palpable. Neurologic sensorium is intact. Deep tendon reflexes are intact and muscle strength +5 over 5 dorsiflexion plantar flexors and inverters everters all intrinsic musculature is intact. Orthopedic evaluation demonstrates no reproducible pain on palpation of the right foot. He has great range of motion of the first metatarsophalangeal joint considering it has been approximately 10 years since we put a artificial joint and the first metatarsophalangeal joint area. I performed a straight leg test right which duplicated the exact pain that he was feeling radiating from his second toe. This should be indicative of lumbar radiculopathy or sciatica. Cutaneous evaluation demonstrates supple well-hydrated cutis no open lesions or wounds.  Assessment: Sciatica or lumbar radiculopathy right side.  Plan: He is well known to the neurosurgery group in [place]. I recommended that he follow-up with his primary care provider who should make referral to this group.

## 2016-05-06 ENCOUNTER — Telehealth: Payer: Self-pay | Admitting: Family Medicine

## 2016-05-06 ENCOUNTER — Encounter: Payer: Self-pay | Admitting: Family Medicine

## 2016-05-06 ENCOUNTER — Ambulatory Visit (INDEPENDENT_AMBULATORY_CARE_PROVIDER_SITE_OTHER): Payer: Medicare Other | Admitting: Family Medicine

## 2016-05-06 VITALS — BP 128/80 | HR 88 | Temp 98.6°F | Resp 18 | Ht 70.0 in | Wt 223.3 lb

## 2016-05-06 DIAGNOSIS — G8929 Other chronic pain: Secondary | ICD-10-CM

## 2016-05-06 DIAGNOSIS — E119 Type 2 diabetes mellitus without complications: Secondary | ICD-10-CM | POA: Diagnosis not present

## 2016-05-06 DIAGNOSIS — E785 Hyperlipidemia, unspecified: Secondary | ICD-10-CM

## 2016-05-06 DIAGNOSIS — M5441 Lumbago with sciatica, right side: Secondary | ICD-10-CM | POA: Insufficient documentation

## 2016-05-06 DIAGNOSIS — M542 Cervicalgia: Secondary | ICD-10-CM | POA: Diagnosis not present

## 2016-05-06 DIAGNOSIS — I10 Essential (primary) hypertension: Secondary | ICD-10-CM | POA: Diagnosis not present

## 2016-05-06 LAB — LIPID PANEL
Cholesterol: 154 mg/dL (ref 125–200)
HDL: 36 mg/dL — AB (ref >=40)
LDL Cholesterol: 80 mg/dL (ref <130)
TRIGLYCERIDES: 190 mg/dL — AB (ref <150)
Total CHOL/HDL Ratio: 4.3 Ratio (ref <=5.0)
VLDL: 38 mg/dL — ABNORMAL HIGH (ref <30)

## 2016-05-06 LAB — POCT GLYCOSYLATED HEMOGLOBIN (HGB A1C): Hemoglobin A1C: 5.8

## 2016-05-06 LAB — GLUCOSE, POCT (MANUAL RESULT ENTRY): POC GLUCOSE: 153 mg/dL — AB (ref 70–99)

## 2016-05-06 MED ORDER — OXYCODONE-ACETAMINOPHEN 10-325 MG PO TABS: 1.0000 | ORAL_TABLET | Freq: Four times a day (QID) | ORAL | Status: DC | PRN

## 2016-05-06 MED ORDER — CLONIDINE HCL 0.1 MG PO TABS: 0.1000 mg | ORAL_TABLET | Freq: Two times a day (BID) | ORAL | Status: DC

## 2016-05-06 MED ORDER — METFORMIN HCL ER 500 MG PO TB24
500.0000 mg | ORAL_TABLET | Freq: Two times a day (BID) | ORAL | Status: DC
Start: 2016-05-06 — End: 2016-08-13

## 2016-05-06 NOTE — Progress Notes (Signed)
Name: Juan Le   MRN: [id number]    DOB: Apr 21, 1946   Date:05/06/2016       Progress Note  Subjective  Chief Complaint  Chief Complaint  Patient presents with  . Back Pain    chronic pain med refills  . Diabetes    follow up    Diabetes He presents for his follow-up diabetic visit. He has type 2 diabetes mellitus. His disease course has been stable. Associated symptoms include polyuria. Pertinent negatives for diabetes include no fatigue and no polydipsia. Symptoms are stable. Current diabetic treatment includes oral agent (monotherapy). His breakfast blood glucose range is generally 130-140 mg/dl. An ACE inhibitor/angiotensin II receptor blocker is being taken.  Neck Pain  This is a chronic problem. The problem has been unchanged. The pain is at a severity of 2/10. The symptoms are aggravated by bending and position. Associated symptoms include leg pain. Pertinent negatives include no tingling. He has tried oral narcotics for the symptoms.  Leg Pain  There was no injury mechanism. The pain is present in the right leg, right foot and right thigh. The pain is at a severity of 7/10. The pain is moderate. Pertinent negatives include no tingling. The symptoms are aggravated by movement. Treatments tried: Has been on Oxycodone-Acetaminophen for chronic neck pain.     Past Medical History  Diagnosis Date  . Diabetes ([place])   . Hypertriglyceridemia   . Hypokalemia   . Vitamin D deficiency   . Chronic, continuous use of opioids   . Anxiety   . Insomnia   . Hypertension   . GERD (gastroesophageal reflux disease)   . History of diverticulitis   . Mitral regurgitation   . DJD (degenerative joint disease)   . Hypertension     Past Surgical History  Procedure Laterality Date  . Umbilical hernia repair  04/2012  . Coronary stent placement  11/2006  . Coronary artery bypass graft  1997  . Foot surgery  2003    Family History  Problem Relation Age of Onset  . Throat cancer  Father   . Stroke Brother   . Stroke Mother   . Breast cancer Paternal Aunt   . Colon cancer Father   . Kidney disease Mother   . Heart disease Mother     Social History   Social History  . Marital Status: Married    Spouse Name: N/A  . Number of Children: 0  . Years of Education: N/A   Occupational History  . Not on file.   Social History Main Topics  . Smoking status: Former Research scientist (life sciences)  . Smokeless tobacco: Not on file  . Alcohol Use: No  . Drug Use: No  . Sexual Activity:    Partners: Female   Other Topics Concern  . Not on file   Social History Narrative     Current outpatient prescriptions:  .  aspirin 81 MG tablet, Take 1 tablet by mouth daily., Disp: , Rfl:  .  atorvastatin (LIPITOR) 40 MG tablet, Take 1 tablet (40 mg total) by mouth daily at 6 PM., Disp: 90 tablet, Rfl: 0 .  clonazePAM (KLONOPIN) 1 MG tablet, Take 1 tablet (1 mg total) by mouth 3 (three) times daily as needed for anxiety., Disp: 90 tablet, Rfl: 0 .  cloNIDine (CATAPRES) 0.1 MG tablet, Take 1 tablet (0.1 mg total) by mouth 2 (two) times daily., Disp: 180 tablet, Rfl: 0 .  ergocalciferol (VITAMIN D2) 50000 UNITS capsule, Take 1 capsule by mouth  once a week. Reported on 04/05/2016, Disp: , Rfl:  .  glucose blood test strip, Use as instructed, Disp: 50 each, Rfl: 12 .  Icosapent Ethyl (VASCEPA) 1 G CAPS, Take 2 g by mouth 2 (two) times daily with a meal., Disp: 120 capsule, Rfl: 0 .  metFORMIN (GLUCOPHAGE-XR) 500 MG 24 hr tablet, Take 1 tablet (500 mg total) by mouth 2 (two) times daily., Disp: 180 tablet, Rfl: 0 .  omeprazole (PRILOSEC) 40 MG capsule, Take 1 capsule (40 mg total) by mouth daily., Disp: 90 capsule, Rfl: 0 .  oxyCODONE-acetaminophen (PERCOCET) 10-325 MG tablet, Take 1 tablet by mouth every 6 (six) hours as needed. Pt. Takes 5 tabs/ day, Disp: 155 tablet, Rfl: 0 .  telmisartan (MICARDIS) 40 MG tablet, Take 1 tablet (40 mg total) by mouth daily., Disp: 90 tablet, Rfl: 0 .  zolpidem (AMBIEN)  10 MG tablet, Take 1 tablet (10 mg total) by mouth at bedtime., Disp: 30 tablet, Rfl: 2  Allergies  Allergen Reactions  . Ace Inhibitors Cough  . Diphenhydramine     stomach upset     Review of Systems  Constitutional: Negative for fatigue.  Gastrointestinal: Negative for nausea, vomiting and abdominal pain.  Musculoskeletal: Positive for back pain and neck pain.  Neurological: Negative for tingling.  Endo/Heme/Allergies: Negative for polydipsia.    Objective  Filed Vitals:   05/06/16 0904  BP: 128/80  Pulse: 88  Temp: 98.6 F (37 C)  TempSrc: Oral  Resp: 18  Height: 5\' 10"  (1.778 m)  Weight: 223 lb 4.8 oz (101.288 kg)  SpO2: 98%    Physical Exam  Constitutional: He is oriented to person, place, and time and well-developed, well-nourished, and in no distress.  HENT:  Head: Normocephalic and atraumatic.  Cardiovascular: Normal rate, regular rhythm and normal heart sounds.   No murmur heard. Pulmonary/Chest: Effort normal and breath sounds normal. He has no wheezes.  Abdominal: Soft. Bowel sounds are normal.  Musculoskeletal:       Lumbar back: He exhibits pain and spasm.       Back:  SLR test positive on the right side.  Neurological: He is alert and oriented to person, place, and time.  Psychiatric: Mood, memory, affect and judgment normal.  Nursing note and vitals reviewed.     Recent Results (from the past 2160 hour(s))  POCT Glucose (CBG)     Status: Abnormal   Collection Time: 05/06/16  9:11 AM  Result Value Ref Range   POC Glucose 153 (A) 70 - 99 mg/dl  POCT HgB A1C     Status: None   Collection Time: 05/06/16  9:17 AM  Result Value Ref Range   Hemoglobin A1C 5.8      Assessment & Plan  1. Type 2 diabetes mellitus without complication, without long-term current use of insulin (HCC) A1c at goal, continue on metformin as prescribed. - POCT HgB A1C - POCT Glucose (CBG) - metFORMIN (GLUCOPHAGE-XR) 500 MG 24 hr tablet; Take 1 tablet (500 mg  total) by mouth 2 (two) times daily.  Dispense: 180 tablet; Refill: 0  2. Chronic neck pain Stable, on chronic opioid therapy, compliant with controlled substances agreement. - oxyCODONE-acetaminophen (PERCOCET) 10-325 MG tablet; Take 1 tablet by mouth every 6 (six) hours as needed. Pt. Takes 5 tabs/ day  Dispense: 155 tablet; Refill: 0  3. Acute right-sided low back pain with right-sided sciatica Concern for disc herniation given positive SLR, obtain MRI of lumbar spine and referral to neurosurgery - Ambulatory  referral to Neurosurgery - MR Lumbar Spine Wo Contrast; Future  4. Dyslipidemia  - Lipid Profile  5. Essential hypertension  - cloNIDine (CATAPRES) 0.1 MG tablet; Take 1 tablet (0.1 mg total) by mouth 2 (two) times daily.  Dispense: 180 tablet; Refill: 0     Giovanie Lefebre [facility] 05/06/2016 9:45 AM

## 2016-05-06 NOTE — Telephone Encounter (Signed)
Patient was seen on today, but did not get his refill on the following medications:  Clonidine 0.1mg  MetFormin 500 mg  Patient uses [facility]

## 2016-05-10 ENCOUNTER — Ambulatory Visit: Payer: Self-pay | Admitting: Podiatry

## 2016-05-11 NOTE — Telephone Encounter (Signed)
Spoke to [facility]. Scripts are there for pick up. Patient notified

## 2016-05-12 NOTE — Progress Notes (Signed)
Patient notified of labs.   

## 2016-05-13 ENCOUNTER — Telehealth: Payer: Self-pay | Admitting: Family Medicine

## 2016-05-14 NOTE — Telephone Encounter (Signed)
Appointment for MRI made for May 27, 2016 at 11:00 am at [facility]. CPT U2831112 ICD-10 M54.41

## 2016-05-27 ENCOUNTER — Ambulatory Visit
Admission: RE | Admit: 2016-05-27 | Discharge: 2016-05-27 | Disposition: A | Discharge status: Home / Self Care | Payer: Medicare Other | Source: Ambulatory Visit | Attending: Family Medicine | Admitting: Family Medicine

## 2016-05-27 DIAGNOSIS — M5136 Other intervertebral disc degeneration, lumbar region: Secondary | ICD-10-CM | POA: Insufficient documentation

## 2016-05-27 DIAGNOSIS — M5137 Other intervertebral disc degeneration, lumbosacral region: Secondary | ICD-10-CM | POA: Insufficient documentation

## 2016-05-27 DIAGNOSIS — M47817 Spondylosis without myelopathy or radiculopathy, lumbosacral region: Secondary | ICD-10-CM | POA: Insufficient documentation

## 2016-05-27 DIAGNOSIS — M5441 Lumbago with sciatica, right side: Secondary | ICD-10-CM | POA: Diagnosis not present

## 2016-05-27 DIAGNOSIS — M47816 Spondylosis without myelopathy or radiculopathy, lumbar region: Secondary | ICD-10-CM | POA: Insufficient documentation

## 2016-06-01 DIAGNOSIS — M5137 Other intervertebral disc degeneration, lumbosacral region: Secondary | ICD-10-CM | POA: Diagnosis not present

## 2016-06-01 DIAGNOSIS — I10 Essential (primary) hypertension: Secondary | ICD-10-CM | POA: Diagnosis not present

## 2016-06-01 DIAGNOSIS — M5126 Other intervertebral disc displacement, lumbar region: Secondary | ICD-10-CM | POA: Diagnosis not present

## 2016-06-04 ENCOUNTER — Ambulatory Visit (INDEPENDENT_AMBULATORY_CARE_PROVIDER_SITE_OTHER): Payer: Medicare Other | Admitting: Family Medicine

## 2016-06-04 ENCOUNTER — Encounter: Payer: Self-pay | Admitting: Family Medicine

## 2016-06-04 DIAGNOSIS — F419 Anxiety disorder, unspecified: Secondary | ICD-10-CM | POA: Diagnosis not present

## 2016-06-04 DIAGNOSIS — G8929 Other chronic pain: Secondary | ICD-10-CM

## 2016-06-04 DIAGNOSIS — G4700 Insomnia, unspecified: Secondary | ICD-10-CM | POA: Diagnosis not present

## 2016-06-04 DIAGNOSIS — M542 Cervicalgia: Secondary | ICD-10-CM | POA: Diagnosis not present

## 2016-06-04 MED ORDER — OXYCODONE-ACETAMINOPHEN 10-325 MG PO TABS: 1.0000 | ORAL_TABLET | Freq: Four times a day (QID) | ORAL | 0 refills | Status: DC | PRN

## 2016-06-04 MED ORDER — CLONAZEPAM 1 MG PO TABS: 1.0000 mg | ORAL_TABLET | Freq: Three times a day (TID) | ORAL | 0 refills | Status: DC | PRN

## 2016-06-04 MED ORDER — ZOLPIDEM TARTRATE 10 MG PO TABS
10.0000 mg | ORAL_TABLET | Freq: Every day | ORAL | 2 refills | Status: DC
Start: 2016-06-04 — End: 2016-08-27

## 2016-06-04 NOTE — Progress Notes (Signed)
Name: Juan Le   MRN: [id number]    DOB: [birth year]   Date:06/04/2016       Progress Note  Subjective  Chief Complaint  Chief Complaint  Patient presents with  . Medication Refill    Neck Pain   This is a chronic problem. The problem occurs constantly. The problem has been unchanged. The pain is at a severity of 2/10. The symptoms are aggravated by bending and position (Pain worse in the morning when he wakes up and bends over.). Pertinent negatives include no leg pain (Right leg pain and numbness,). He has tried oral narcotics for the symptoms.  Anxiety  Presents for follow-up visit. The problem has been unchanged. Symptoms include insomnia and nervous/anxious behavior. Patient reports no excessive worry, panic or shortness of breath. Primary symptoms comment: As long as he is on medication, he feels well.. Symptoms occur most days. The severity of symptoms is severe and causing significant distress.   Past treatments include benzodiazephines. The treatment provided significant relief. Compliance with prior treatments has been good.  Insomnia  Primary symptoms: difficulty falling asleep.  The problem occurs nightly. The problem is unchanged. Past treatments include medication. Typical bedtime:  10-11 P.M..  How long after going to bed to you fall asleep: 15-30 minutes.        Past Medical History:  Diagnosis Date  . Anxiety   . Chronic, continuous use of opioids   . Diabetes ([place])   . DJD (degenerative joint disease)   . GERD (gastroesophageal reflux disease)   . History of diverticulitis   . Hypertension   . Hypertension   . Hypertriglyceridemia   . Hypokalemia   . Insomnia   . Mitral regurgitation   . Vitamin D deficiency     Past Surgical History:  Procedure Laterality Date  . CORONARY ARTERY BYPASS GRAFT  1997  . CORONARY STENT PLACEMENT  11/2006  . FOOT SURGERY  2003  . UMBILICAL HERNIA REPAIR  04/2012    Family History  Problem Relation Age of Onset    . Throat cancer Father   . Stroke Brother   . Stroke Mother   . Breast cancer Paternal Aunt   . Colon cancer Father   . Kidney disease Mother   . Heart disease Mother     Social History   Social History  . Marital status: Married    Spouse name: N/A  . Number of children: 0  . Years of education: N/A   Occupational History  . Not on file.   Social History Main Topics  . Smoking status: Former Research scientist (life sciences)  . Smokeless tobacco: Not on file  . Alcohol use No  . Drug use: No  . Sexual activity: Yes    Partners: Female   Other Topics Concern  . Not on file   Social History Narrative  . No narrative on file     Current Outpatient Prescriptions:  .  aspirin 81 MG tablet, Take 1 tablet by mouth daily., Disp: , Rfl:  .  atorvastatin (LIPITOR) 40 MG tablet, Take 1 tablet (40 mg total) by mouth daily at 6 PM., Disp: 90 tablet, Rfl: 0 .  clonazePAM (KLONOPIN) 1 MG tablet, Take 1 tablet (1 mg total) by mouth 3 (three) times daily as needed for anxiety., Disp: 90 tablet, Rfl: 0 .  cloNIDine (CATAPRES) 0.1 MG tablet, Take 1 tablet (0.1 mg total) by mouth 2 (two) times daily., Disp: 180 tablet, Rfl: 0 .  ergocalciferol (VITAMIN D2)  50000 UNITS capsule, Take 1 capsule by mouth once a week. Reported on 04/05/2016, Disp: , Rfl:  .  glucose blood test strip, Use as instructed, Disp: 50 each, Rfl: 12 .  Icosapent Ethyl (VASCEPA) 1 G CAPS, Take 2 g by mouth 2 (two) times daily with a meal., Disp: 120 capsule, Rfl: 0 .  metFORMIN (GLUCOPHAGE-XR) 500 MG 24 hr tablet, Take 1 tablet (500 mg total) by mouth 2 (two) times daily., Disp: 180 tablet, Rfl: 0 .  nabumetone (RELAFEN) 750 MG tablet, , Disp: , Rfl:  .  omeprazole (PRILOSEC) 40 MG capsule, Take 1 capsule (40 mg total) by mouth daily., Disp: 90 capsule, Rfl: 0 .  oxyCODONE-acetaminophen (PERCOCET) 10-325 MG tablet, Take 1 tablet by mouth every 6 (six) hours as needed. Pt. Takes 5 tabs/ day, Disp: 155 tablet, Rfl: 0 .  telmisartan (MICARDIS) 40  MG tablet, Take 1 tablet (40 mg total) by mouth daily., Disp: 90 tablet, Rfl: 0 .  zolpidem (AMBIEN) 10 MG tablet, Take 1 tablet (10 mg total) by mouth at bedtime., Disp: 30 tablet, Rfl: 2  Allergies  Allergen Reactions  . Ace Inhibitors Cough  . Diphenhydramine     stomach upset     Review of Systems  Respiratory: Negative for shortness of breath.   Musculoskeletal: Positive for neck pain.  Psychiatric/Behavioral: The patient is nervous/anxious and has insomnia.       Objective  Vitals:   06/04/16 1115  Pulse: 75  Resp: 14  Temp: 98.6 F (37 C)  TempSrc: Oral  SpO2: 97%  Weight: 226 lb (102.5 kg)    Physical Exam  Constitutional: He is oriented to person, place, and time and well-developed, well-nourished, and in no distress.  HENT:  Head: Normocephalic and atraumatic.  Cardiovascular: Normal rate, regular rhythm, S1 normal, S2 normal and normal heart sounds.   No murmur heard. Pulmonary/Chest: Effort normal and breath sounds normal. He has no wheezes. He has no rhonchi.  Abdominal: Soft. Bowel sounds are normal.  Musculoskeletal:       Lumbar back: He exhibits tenderness, pain and spasm.       Back:  SLR test positive on the right side.  Neurological: He is alert and oriented to person, place, and time.  Psychiatric: Mood, memory, affect and judgment normal.  Nursing note and vitals reviewed.     Assessment & Plan  1. Cannot sleep Continue on Ambien 10 mg at bedtime - zolpidem (AMBIEN) 10 MG tablet; Take 1 tablet (10 mg total) by mouth at bedtime.  Dispense: 30 tablet; Refill: 2  2. Chronic neck pain Stable, responsive to chronic opioid therapy. Patient compliant with controlled substances agreement. Refills provided - oxyCODONE-acetaminophen (PERCOCET) 10-325 MG tablet; Take 1 tablet by mouth every 6 (six) hours as needed. Pt. Takes 5 tabs/ day  Dispense: 155 tablet; Refill: 0  3. Anxiety Anxiety responsive to clonazepam 1 mg 3 times daily as needed.  Refills provided - clonazePAM (KLONOPIN) 1 MG tablet; Take 1 tablet (1 mg total) by mouth 3 (three) times daily as needed for anxiety.  Dispense: 90 tablet; Refill: 0   Yatzil Clippinger [facility] 06/04/2016 11:39 AM

## 2016-06-17 ENCOUNTER — Telehealth: Payer: Self-pay | Admitting: Family Medicine

## 2016-06-17 NOTE — Telephone Encounter (Signed)
lmom to inform pt

## 2016-06-17 NOTE — Telephone Encounter (Signed)
This medication has not been prescribed by [place], please contact the ordering provider

## 2016-06-29 ENCOUNTER — Encounter: Payer: Self-pay | Admitting: Family Medicine

## 2016-06-29 ENCOUNTER — Ambulatory Visit (INDEPENDENT_AMBULATORY_CARE_PROVIDER_SITE_OTHER): Payer: Medicare Other | Admitting: Family Medicine

## 2016-06-29 DIAGNOSIS — M542 Cervicalgia: Secondary | ICD-10-CM

## 2016-06-29 DIAGNOSIS — K219 Gastro-esophageal reflux disease without esophagitis: Secondary | ICD-10-CM | POA: Diagnosis not present

## 2016-06-29 DIAGNOSIS — E785 Hyperlipidemia, unspecified: Secondary | ICD-10-CM | POA: Diagnosis not present

## 2016-06-29 DIAGNOSIS — G8929 Other chronic pain: Secondary | ICD-10-CM

## 2016-06-29 DIAGNOSIS — I10 Essential (primary) hypertension: Secondary | ICD-10-CM

## 2016-06-29 MED ORDER — OXYCODONE-ACETAMINOPHEN 10-325 MG PO TABS: 1.0000 | ORAL_TABLET | Freq: Four times a day (QID) | ORAL | 0 refills | Status: DC | PRN

## 2016-06-29 MED ORDER — OMEPRAZOLE 40 MG PO CPDR: 40.0000 mg | DELAYED_RELEASE_CAPSULE | Freq: Every day | ORAL | 0 refills | Status: DC

## 2016-06-29 MED ORDER — TELMISARTAN 40 MG PO TABS: 40.0000 mg | ORAL_TABLET | Freq: Every day | ORAL | 0 refills | Status: DC

## 2016-06-29 MED ORDER — ATORVASTATIN CALCIUM 40 MG PO TABS: 40.0000 mg | ORAL_TABLET | Freq: Every day | ORAL | 0 refills | Status: DC

## 2016-06-29 NOTE — Progress Notes (Signed)
Name: Juan Le   MRN: [id number]    DOB: 05/05/1946   Date:06/29/2016       Progress Note  Subjective  Chief Complaint  Chief Complaint  Patient presents with  . Follow-up    1 mo  . Medication Refill    Hypertension  This is a chronic problem. The problem is controlled. Associated symptoms include neck pain. Pertinent negatives include no blurred vision, chest pain, headaches or palpitations. Past treatments include central alpha agonists. There is no history of kidney disease, CAD/MI or CVA.  Gastroesophageal Reflux  He reports no abdominal pain, no chest pain, no heartburn, no nausea or no sore throat. This is a chronic problem. The symptoms are aggravated by certain foods (pasta, spaghetti, red suaces, pizza etc.). He has tried a PPI for the symptoms. The treatment provided significant relief.  Neck Pain   This is a chronic problem. The problem has been unchanged. The pain is at a severity of 2/10. The symptoms are aggravated by bending and position. Pertinent negatives include no chest pain, fever or headaches. He has tried oral narcotics for the symptoms. The treatment provided significant relief.     Past Medical History:  Diagnosis Date  . Anxiety   . Chronic, continuous use of opioids   . Diabetes ([place])   . DJD (degenerative joint disease)   . GERD (gastroesophageal reflux disease)   . History of diverticulitis   . Hypertension   . Hypertension   . Hypertriglyceridemia   . Hypokalemia   . Insomnia   . Mitral regurgitation   . Vitamin D deficiency     Past Surgical History:  Procedure Laterality Date  . CORONARY ARTERY BYPASS GRAFT  1997  . CORONARY STENT PLACEMENT  11/2006  . FOOT SURGERY  2003  . UMBILICAL HERNIA REPAIR  04/2012    Family History  Problem Relation Age of Onset  . Throat cancer Father   . Colon cancer Father   . Stroke Brother   . Stroke Mother   . Kidney disease Mother   . Heart disease Mother   . Breast cancer Paternal Aunt      Social History   Social History  . Marital status: Married    Spouse name: N/A  . Number of children: 0  . Years of education: N/A   Occupational History  . Not on file.   Social History Main Topics  . Smoking status: Former Research scientist (life sciences)  . Smokeless tobacco: Never Used  . Alcohol use No  . Drug use: No  . Sexual activity: Yes    Partners: Female   Other Topics Concern  . Not on file   Social History Narrative  . No narrative on file     Current Outpatient Prescriptions:  .  aspirin 81 MG tablet, Take 1 tablet by mouth daily., Disp: , Rfl:  .  atorvastatin (LIPITOR) 40 MG tablet, Take 1 tablet (40 mg total) by mouth daily at 6 PM., Disp: 90 tablet, Rfl: 0 .  clonazePAM (KLONOPIN) 1 MG tablet, Take 1 tablet (1 mg total) by mouth 3 (three) times daily as needed for anxiety., Disp: 90 tablet, Rfl: 0 .  cloNIDine (CATAPRES) 0.1 MG tablet, Take 1 tablet (0.1 mg total) by mouth 2 (two) times daily., Disp: 180 tablet, Rfl: 0 .  glucose blood test strip, Use as instructed, Disp: 50 each, Rfl: 12 .  Icosapent Ethyl (VASCEPA) 1 G CAPS, Take 2 g by mouth 2 (two) times daily with  a meal., Disp: 120 capsule, Rfl: 0 .  metFORMIN (GLUCOPHAGE-XR) 500 MG 24 hr tablet, Take 1 tablet (500 mg total) by mouth 2 (two) times daily., Disp: 180 tablet, Rfl: 0 .  nabumetone (RELAFEN) 750 MG tablet, , Disp: , Rfl:  .  omeprazole (PRILOSEC) 40 MG capsule, Take 1 capsule (40 mg total) by mouth daily., Disp: 90 capsule, Rfl: 0 .  oxyCODONE-acetaminophen (PERCOCET) 10-325 MG tablet, Take 1 tablet by mouth every 6 (six) hours as needed. Pt. Takes 5 tabs/ day, Disp: 155 tablet, Rfl: 0 .  telmisartan (MICARDIS) 40 MG tablet, Take 1 tablet (40 mg total) by mouth daily., Disp: 90 tablet, Rfl: 0 .  zolpidem (AMBIEN) 10 MG tablet, Take 1 tablet (10 mg total) by mouth at bedtime., Disp: 30 tablet, Rfl: 2 .  ergocalciferol (VITAMIN D2) 50000 UNITS capsule, Take 1 capsule by mouth once a week. Reported on 04/05/2016,  Disp: , Rfl:   Allergies  Allergen Reactions  . Ace Inhibitors Cough  . Diphenhydramine     stomach upset     Review of Systems  Constitutional: Negative for fever.  HENT: Negative for sore throat.   Eyes: Negative for blurred vision.  Cardiovascular: Negative for chest pain and palpitations.  Gastrointestinal: Negative for abdominal pain, heartburn and nausea.  Musculoskeletal: Positive for neck pain.  Neurological: Negative for headaches.     Objective  Vitals:   06/29/16 1011  BP: 138/78  Pulse: 88  Resp: 14  Temp: 99.1 F (37.3 C)  TempSrc: Oral  SpO2: 97%  Weight: 222 lb (100.7 kg)  Height: 5\' 10"  (1.778 m)    Physical Exam  Constitutional: He is oriented to person, place, and time and well-developed, well-nourished, and in no distress.  HENT:  Head: Normocephalic and atraumatic.  Cardiovascular: Normal rate, regular rhythm and normal heart sounds.   No murmur heard. Pulmonary/Chest: Effort normal and breath sounds normal. He has no wheezes.  Abdominal: Soft. Bowel sounds are normal. There is no tenderness.  Musculoskeletal:       Cervical back: He exhibits tenderness, pain and spasm.       Back:  Neurological: He is alert and oriented to person, place, and time.  Psychiatric: Mood, memory, affect and judgment normal.  Nursing note and vitals reviewed.     Assessment & Plan  1. Chronic neck pain Stable on opioid therapy, refills provided. - oxyCODONE-acetaminophen (PERCOCET) 10-325 MG tablet; Take 1 tablet by mouth every 6 (six) hours as needed. Pt. Takes 5 tabs/ day  Dispense: 155 tablet; Refill: 0  2. Dyslipidemia  - atorvastatin (LIPITOR) 40 MG tablet; Take 1 tablet (40 mg total) by mouth daily at 6 PM.  Dispense: 90 tablet; Refill: 0  3. Essential hypertension  - telmisartan (MICARDIS) 40 MG tablet; Take 1 tablet (40 mg total) by mouth daily.  Dispense: 90 tablet; Refill: 0  4. Gastroesophageal reflux disease, esophagitis presence not  specified  - omeprazole (PRILOSEC) 40 MG capsule; Take 1 capsule (40 mg total) by mouth daily.  Dispense: 90 capsule; Refill: 0   Ignacio Lowder [facility] 06/29/2016 10:37 AM

## 2016-07-05 DIAGNOSIS — M5416 Radiculopathy, lumbar region: Secondary | ICD-10-CM | POA: Diagnosis not present

## 2016-07-05 DIAGNOSIS — M5137 Other intervertebral disc degeneration, lumbosacral region: Secondary | ICD-10-CM | POA: Diagnosis not present

## 2016-07-29 ENCOUNTER — Ambulatory Visit (INDEPENDENT_AMBULATORY_CARE_PROVIDER_SITE_OTHER): Payer: Medicare Other | Admitting: Family Medicine

## 2016-07-29 ENCOUNTER — Encounter: Payer: Self-pay | Admitting: Family Medicine

## 2016-07-29 VITALS — BP 136/77 | HR 78 | Temp 98.4°F | Resp 16 | Ht 70.0 in | Wt 221.0 lb

## 2016-07-29 DIAGNOSIS — G8929 Other chronic pain: Secondary | ICD-10-CM | POA: Diagnosis not present

## 2016-07-29 DIAGNOSIS — Z23 Encounter for immunization: Secondary | ICD-10-CM

## 2016-07-29 DIAGNOSIS — E559 Vitamin D deficiency, unspecified: Secondary | ICD-10-CM | POA: Diagnosis not present

## 2016-07-29 DIAGNOSIS — E119 Type 2 diabetes mellitus without complications: Secondary | ICD-10-CM

## 2016-07-29 DIAGNOSIS — M542 Cervicalgia: Secondary | ICD-10-CM

## 2016-07-29 LAB — GLUCOSE, POCT (MANUAL RESULT ENTRY): POC GLUCOSE: 146 mg/dL — AB (ref 70–99)

## 2016-07-29 LAB — POCT GLYCOSYLATED HEMOGLOBIN (HGB A1C): HEMOGLOBIN A1C: 5.8

## 2016-07-29 MED ORDER — OXYCODONE-ACETAMINOPHEN 10-325 MG PO TABS: 1.0000 | ORAL_TABLET | Freq: Four times a day (QID) | ORAL | 0 refills | Status: DC | PRN

## 2016-07-29 NOTE — Progress Notes (Signed)
Name: Juan Le   MRN: [id number]    DOB: 10/28/1945   Date:07/29/2016       Progress Note  Subjective  Chief Complaint  Chief Complaint  Patient presents with  . Follow-up    1 mo  . Medication Refill    oxycodone / metformin    Neck Pain   This is a chronic problem. The problem occurs constantly. The problem has been unchanged. The pain is at a severity of 2/10. The symptoms are aggravated by bending and position (Pain worse in the morning when he wakes up and bends over.). Pertinent negatives include no chest pain, fever or leg pain. He has tried oral narcotics for the symptoms.    Past Medical History:  Diagnosis Date  . Anxiety   . Chronic, continuous use of opioids   . Diabetes ([place])   . DJD (degenerative joint disease)   . GERD (gastroesophageal reflux disease)   . History of diverticulitis   . Hypertension   . Hypertension   . Hypertriglyceridemia   . Hypokalemia   . Insomnia   . Mitral regurgitation   . Vitamin D deficiency     Past Surgical History:  Procedure Laterality Date  . CORONARY ARTERY BYPASS GRAFT  1997  . CORONARY STENT PLACEMENT  11/2006  . FOOT SURGERY  2003  . UMBILICAL HERNIA REPAIR  04/2012    Family History  Problem Relation Age of Onset  . Throat cancer Father   . Colon cancer Father   . Stroke Brother   . Stroke Mother   . Kidney disease Mother   . Heart disease Mother   . Breast cancer Paternal Aunt     Social History   Social History  . Marital status: Married    Spouse name: N/A  . Number of children: 0  . Years of education: N/A   Occupational History  . Not on file.   Social History Main Topics  . Smoking status: Former Research scientist (life sciences)  . Smokeless tobacco: Never Used  . Alcohol use No  . Drug use: No  . Sexual activity: Yes    Partners: Female   Other Topics Concern  . Not on file   Social History Narrative  . No narrative on file     Current Outpatient Prescriptions:  .  aspirin 81 MG tablet, Take 1  tablet by mouth daily., Disp: , Rfl:  .  atorvastatin (LIPITOR) 40 MG tablet, Take 1 tablet (40 mg total) by mouth daily at 6 PM., Disp: 90 tablet, Rfl: 0 .  clonazePAM (KLONOPIN) 1 MG tablet, Take 1 tablet (1 mg total) by mouth 3 (three) times daily as needed for anxiety., Disp: 90 tablet, Rfl: 0 .  cloNIDine (CATAPRES) 0.1 MG tablet, Take 1 tablet (0.1 mg total) by mouth 2 (two) times daily., Disp: 180 tablet, Rfl: 0 .  glucose blood test strip, Use as instructed, Disp: 50 each, Rfl: 12 .  metFORMIN (GLUCOPHAGE-XR) 500 MG 24 hr tablet, Take 1 tablet (500 mg total) by mouth 2 (two) times daily., Disp: 180 tablet, Rfl: 0 .  nabumetone (RELAFEN) 750 MG tablet, , Disp: , Rfl:  .  omeprazole (PRILOSEC) 40 MG capsule, Take 1 capsule (40 mg total) by mouth daily., Disp: 90 capsule, Rfl: 0 .  oxyCODONE-acetaminophen (PERCOCET) 10-325 MG tablet, Take 1 tablet by mouth every 6 (six) hours as needed. Pt. Takes 5 tabs/ day, Disp: 155 tablet, Rfl: 0 .  telmisartan (MICARDIS) 40 MG tablet, Take 1  tablet (40 mg total) by mouth daily., Disp: 90 tablet, Rfl: 0 .  zolpidem (AMBIEN) 10 MG tablet, Take 1 tablet (10 mg total) by mouth at bedtime., Disp: 30 tablet, Rfl: 2 .  ergocalciferol (VITAMIN D2) 50000 UNITS capsule, Take 1 capsule by mouth once a week. Reported on 04/05/2016, Disp: , Rfl:  .  Icosapent Ethyl (VASCEPA) 1 G CAPS, Take 2 g by mouth 2 (two) times daily with a meal. (Patient not taking: Reported on 07/29/2016), Disp: 120 capsule, Rfl: 0  Allergies  Allergen Reactions  . Ace Inhibitors Cough  . Diphenhydramine     stomach upset     Review of Systems  Constitutional: Negative for chills, fever and malaise/fatigue.  Respiratory: Negative for shortness of breath.   Cardiovascular: Negative for chest pain.  Musculoskeletal: Positive for neck pain.    Objective  Vitals:   07/29/16 0814  BP: 136/77  Pulse: 78  Resp: 16  Temp: 98.4 F (36.9 C)  TempSrc: Oral  SpO2: 96%  Weight: 221 lb  (100.2 kg)  Height: 5\' 10"  (1.778 m)    Physical Exam  Constitutional: He is oriented to person, place, and time and well-developed, well-nourished, and in no distress.  HENT:  Head: Normocephalic and atraumatic.  Cardiovascular: Normal rate, regular rhythm and normal heart sounds.   No murmur heard. Pulmonary/Chest: Effort normal and breath sounds normal. He has no wheezes.  Abdominal: Soft. Bowel sounds are normal. There is no tenderness.  Musculoskeletal:       Cervical back: He exhibits tenderness, pain and spasm.       Back:  Neurological: He is alert and oriented to person, place, and time.  Psychiatric: Mood, memory, affect and judgment normal.  Nursing note and vitals reviewed.   Assessment & Plan   1. Need for immunization against influenza  - Flu vaccine HIGH DOSE PF (Fluzone High dose)  2. Chronic neck pain Stable and responsive to opioid therapy. Patient compliant with controlled substances agreement and understands the dependence potential, side effects and drug reactions of opioid - oxyCODONE-acetaminophen (PERCOCET) 10-325 MG tablet; Take 1 tablet by mouth every 6 (six) hours as needed. Pt. Takes 5 tabs/ day  Dispense: 155 tablet; Refill: 0  3. Vitamin D deficiency  - Vitamin D (25 hydroxy)  Garima Chronis [facility] 07/29/2016 8:40 AM

## 2016-07-30 LAB — VITAMIN D 25 HYDROXY (VIT D DEFICIENCY, FRACTURES): VIT D 25 HYDROXY: 8 ng/mL — AB (ref 30–100)

## 2016-08-04 ENCOUNTER — Telehealth: Payer: Self-pay

## 2016-08-04 MED ORDER — VITAMIN D (ERGOCALCIFEROL) 1.25 MG (50000 UNIT) PO CAPS: 50000.0000 [IU] | ORAL_CAPSULE | ORAL | 0 refills | Status: DC

## 2016-08-04 NOTE — Telephone Encounter (Signed)
Patient has been notified of lab results and a prescription for vitamin D3 50,000 units take 1 capsule once a week for 12 weeks has been sent to [place] per Dr. Manuella Ghazi, patient has been notified and verbalized understanding

## 2016-08-13 ENCOUNTER — Other Ambulatory Visit: Payer: Self-pay | Admitting: Family Medicine

## 2016-08-13 DIAGNOSIS — E119 Type 2 diabetes mellitus without complications: Secondary | ICD-10-CM

## 2016-08-27 ENCOUNTER — Ambulatory Visit (INDEPENDENT_AMBULATORY_CARE_PROVIDER_SITE_OTHER): Payer: Medicare Other | Admitting: Family Medicine

## 2016-08-27 ENCOUNTER — Encounter: Payer: Self-pay | Admitting: Family Medicine

## 2016-08-27 DIAGNOSIS — G8929 Other chronic pain: Secondary | ICD-10-CM

## 2016-08-27 DIAGNOSIS — F419 Anxiety disorder, unspecified: Secondary | ICD-10-CM | POA: Diagnosis not present

## 2016-08-27 DIAGNOSIS — I10 Essential (primary) hypertension: Secondary | ICD-10-CM | POA: Diagnosis not present

## 2016-08-27 DIAGNOSIS — M542 Cervicalgia: Secondary | ICD-10-CM | POA: Diagnosis not present

## 2016-08-27 DIAGNOSIS — G4700 Insomnia, unspecified: Secondary | ICD-10-CM

## 2016-08-27 MED ORDER — CLONAZEPAM 1 MG PO TABS: 1.0000 mg | ORAL_TABLET | Freq: Three times a day (TID) | ORAL | 0 refills | Status: DC | PRN

## 2016-08-27 MED ORDER — OXYCODONE-ACETAMINOPHEN 10-325 MG PO TABS: 1.0000 | ORAL_TABLET | Freq: Four times a day (QID) | ORAL | 0 refills | Status: DC | PRN

## 2016-08-27 MED ORDER — CLONIDINE HCL 0.1 MG PO TABS: 0.1000 mg | ORAL_TABLET | Freq: Two times a day (BID) | ORAL | 0 refills | Status: DC

## 2016-08-27 MED ORDER — ZOLPIDEM TARTRATE 10 MG PO TABS: 10.0000 mg | ORAL_TABLET | Freq: Every day | ORAL | 2 refills | Status: DC

## 2016-08-27 NOTE — Progress Notes (Signed)
Name: Juan Le   MRN: [id number]    DOB: 09-Apr-1946   Date:08/27/2016       Progress Note  Subjective  Chief Complaint  Chief Complaint  Patient presents with  . Follow-up    medication refills    Neck Pain   This is a chronic problem. The problem occurs constantly. The problem has been unchanged. The pain is present in the midline. The pain is at a severity of 2/10. The symptoms are aggravated by bending and position (Pain worse in the morning when he wakes up and twists his neck a certain way). Pertinent negatives include no chest pain, headaches or leg pain (Right leg pain and numbness,). He has tried oral narcotics for the symptoms.  Anxiety  Presents for follow-up visit. The problem has been unchanged. Symptoms include excessive worry, insomnia and nervous/anxious behavior. Patient reports no chest pain, depressed mood, palpitations, panic or shortness of breath. Primary symptoms comment: As long as he is on medication, he feels well.. Symptoms occur most days. The severity of symptoms is severe and causing significant distress.   Past treatments include benzodiazephines. The treatment provided significant relief. Compliance with prior treatments has been good.  Insomnia  Primary symptoms: difficulty falling asleep.  The problem occurs nightly. The problem is unchanged. The symptoms are aggravated by pain. Past treatments include medication. Typical bedtime:  10-11 P.M..  How long after going to bed to you fall asleep: 15-30 minutes.   PMH includes: chronic pain.  Hypertension  This is a chronic problem. The problem is unchanged. Associated symptoms include anxiety and neck pain. Pertinent negatives include no chest pain, headaches, palpitations or shortness of breath. Past treatments include central alpha agonists. Hypertensive end-organ damage includes CAD/MI. There is no history of kidney disease or CVA.    Past Medical History:  Diagnosis Date  . Anxiety   . Chronic,  continuous use of opioids   . Diabetes ([place])   . DJD (degenerative joint disease)   . GERD (gastroesophageal reflux disease)   . History of diverticulitis   . Hypertension   . Hypertension   . Hypertriglyceridemia   . Hypokalemia   . Insomnia   . Mitral regurgitation   . Vitamin D deficiency     Past Surgical History:  Procedure Laterality Date  . CORONARY ARTERY BYPASS GRAFT  1997  . CORONARY STENT PLACEMENT  11/2006  . FOOT SURGERY  2003  . UMBILICAL HERNIA REPAIR  04/2012    Family History  Problem Relation Age of Onset  . Throat cancer Father   . Colon cancer Father   . Stroke Brother   . Stroke Mother   . Kidney disease Mother   . Heart disease Mother   . Breast cancer Paternal Aunt     Social History   Social History  . Marital status: Married    Spouse name: N/A  . Number of children: 0  . Years of education: N/A   Occupational History  . Not on file.   Social History Main Topics  . Smoking status: Former Research scientist (life sciences)  . Smokeless tobacco: Never Used  . Alcohol use No  . Drug use: No  . Sexual activity: Yes    Partners: Female   Other Topics Concern  . Not on file   Social History Narrative  . No narrative on file     Current Outpatient Prescriptions:  .  aspirin 81 MG tablet, Take 1 tablet by mouth daily., Disp: , Rfl:  .  atorvastatin (LIPITOR) 40 MG tablet, Take 1 tablet (40 mg total) by mouth daily at 6 PM., Disp: 90 tablet, Rfl: 0 .  clonazePAM (KLONOPIN) 1 MG tablet, Take 1 tablet (1 mg total) by mouth 3 (three) times daily as needed for anxiety., Disp: 90 tablet, Rfl: 0 .  cloNIDine (CATAPRES) 0.1 MG tablet, Take 1 tablet (0.1 mg total) by mouth 2 (two) times daily., Disp: 180 tablet, Rfl: 0 .  ergocalciferol (VITAMIN D2) 50000 UNITS capsule, Take 1 capsule by mouth once a week. Reported on 04/05/2016, Disp: , Rfl:  .  glucose blood test strip, Use as instructed, Disp: 50 each, Rfl: 12 .  Icosapent Ethyl (VASCEPA) 1 G CAPS, Take 2 g by mouth 2  (two) times daily with a meal., Disp: 120 capsule, Rfl: 0 .  metFORMIN (GLUCOPHAGE-XR) 500 MG 24 hr tablet, Take 1 tablet (500 mg total) by mouth 2 (two) times daily., Disp: 180 tablet, Rfl: 0 .  nabumetone (RELAFEN) 750 MG tablet, , Disp: , Rfl:  .  omeprazole (PRILOSEC) 40 MG capsule, Take 1 capsule (40 mg total) by mouth daily., Disp: 90 capsule, Rfl: 0 .  oxyCODONE-acetaminophen (PERCOCET) 10-325 MG tablet, Take 1 tablet by mouth every 6 (six) hours as needed. Pt. Takes 5 tabs/ day, Disp: 155 tablet, Rfl: 0 .  telmisartan (MICARDIS) 40 MG tablet, Take 1 tablet (40 mg total) by mouth daily., Disp: 90 tablet, Rfl: 0 .  Vitamin D, Ergocalciferol, (DRISDOL) 50000 units CAPS capsule, Take 1 capsule (50,000 Units total) by mouth once a week. For 12 weeks, Disp: 12 capsule, Rfl: 0 .  zolpidem (AMBIEN) 10 MG tablet, Take 1 tablet (10 mg total) by mouth at bedtime., Disp: 30 tablet, Rfl: 2  Allergies  Allergen Reactions  . Ace Inhibitors Cough  . Diphenhydramine     stomach upset     Review of Systems  Respiratory: Negative for shortness of breath.   Cardiovascular: Negative for chest pain and palpitations.  Musculoskeletal: Positive for neck pain.  Neurological: Negative for headaches.  Psychiatric/Behavioral: The patient is nervous/anxious and has insomnia.     Objective  Vitals:   08/27/16 1027  BP: 140/80  Pulse: 68  Resp: 18  Temp: 97.8 F (36.6 C)  TempSrc: Oral  SpO2: 97%  Weight: 226 lb (102.5 kg)  Height: 5\' 10"  (1.778 m)    Physical Exam  Constitutional: He is oriented to person, place, and time and well-developed, well-nourished, and in no distress.  Cardiovascular: Normal rate, regular rhythm, S1 normal, S2 normal and normal heart sounds.   No murmur heard. Pulmonary/Chest: Effort normal and breath sounds normal. He has no wheezes. He has no rhonchi.  Musculoskeletal:       Right ankle: He exhibits no swelling.       Left ankle: He exhibits no swelling.        Cervical back: He exhibits tenderness, pain and spasm.       Back:  Neurological: He is alert and oriented to person, place, and time.  Psychiatric: Mood, memory, affect and judgment normal.  Nursing note and vitals reviewed.    Assessment & Plan  1. Insomnia, unspecified type Stable and responsive to Ambien, refills provided - zolpidem (AMBIEN) 10 MG tablet; Take 1 tablet (10 mg total) by mouth at bedtime.  Dispense: 30 tablet; Refill: 2  2. Chronic neck pain Stable, responsive to Percocet taken every 6 hours when needed, patient compliant with controlled substances agreement and understands the dependence potential, side effects  and the interactions of opioids. Not in the controlled substances Registry was reviewed. Refills provided - oxyCODONE-acetaminophen (PERCOCET) 10-325 MG tablet; Take 1 tablet by mouth every 6 (six) hours as needed. Pt. Takes 5 tabs/ day  Dispense: 155 tablet; Refill: 0  3. Anxiety Stable, responsive to clonazepam taken 3 times daily when needed - clonazePAM (KLONOPIN) 1 MG tablet; Take 1 tablet (1 mg total) by mouth 3 (three) times daily as needed for anxiety.  Dispense: 90 tablet; Refill: 0  4. Essential hypertension  - cloNIDine (CATAPRES) 0.1 MG tablet; Take 1 tablet (0.1 mg total) by mouth 2 (two) times daily.  Dispense: 180 tablet; Refill: 0   Kinberly Perris [facility] 08/27/2016 10:52 AM

## 2016-08-30 ENCOUNTER — Telehealth: Payer: Self-pay | Admitting: Family Medicine

## 2016-08-30 ENCOUNTER — Other Ambulatory Visit: Payer: Self-pay | Admitting: Family Medicine

## 2016-08-30 DIAGNOSIS — I10 Essential (primary) hypertension: Secondary | ICD-10-CM

## 2016-08-30 NOTE — Telephone Encounter (Signed)
Pt would like a call back

## 2016-09-02 NOTE — Telephone Encounter (Signed)
Returned patient's call and left voicemail asking patient to return call

## 2016-09-23 ENCOUNTER — Ambulatory Visit (INDEPENDENT_AMBULATORY_CARE_PROVIDER_SITE_OTHER): Payer: Medicare Other | Admitting: Family Medicine

## 2016-09-23 ENCOUNTER — Encounter: Payer: Self-pay | Admitting: Family Medicine

## 2016-09-23 DIAGNOSIS — I10 Essential (primary) hypertension: Secondary | ICD-10-CM

## 2016-09-23 DIAGNOSIS — F419 Anxiety disorder, unspecified: Secondary | ICD-10-CM

## 2016-09-23 DIAGNOSIS — K219 Gastro-esophageal reflux disease without esophagitis: Secondary | ICD-10-CM

## 2016-09-23 DIAGNOSIS — E785 Hyperlipidemia, unspecified: Secondary | ICD-10-CM

## 2016-09-23 DIAGNOSIS — G8929 Other chronic pain: Secondary | ICD-10-CM

## 2016-09-23 DIAGNOSIS — M542 Cervicalgia: Secondary | ICD-10-CM

## 2016-09-23 MED ORDER — CLONAZEPAM 1 MG PO TABS: 1.0000 mg | ORAL_TABLET | Freq: Three times a day (TID) | ORAL | 0 refills | Status: DC | PRN

## 2016-09-23 MED ORDER — OMEPRAZOLE 40 MG PO CPDR: 40.0000 mg | DELAYED_RELEASE_CAPSULE | Freq: Every day | ORAL | 0 refills | Status: DC

## 2016-09-23 MED ORDER — OXYCODONE-ACETAMINOPHEN 10-325 MG PO TABS: 1.0000 | ORAL_TABLET | Freq: Four times a day (QID) | ORAL | 0 refills | Status: DC | PRN

## 2016-09-23 MED ORDER — ATORVASTATIN CALCIUM 40 MG PO TABS: 40.0000 mg | ORAL_TABLET | Freq: Every day | ORAL | 0 refills | Status: DC

## 2016-09-23 MED ORDER — TELMISARTAN 40 MG PO TABS: 40.0000 mg | ORAL_TABLET | Freq: Every day | ORAL | 0 refills | Status: DC

## 2016-09-23 NOTE — Progress Notes (Signed)
Name: Juan Le   MRN: [id number]    DOB: 12-25-1945   Date:09/23/2016       Progress Note  Subjective  Chief Complaint  Chief Complaint  Patient presents with  . Follow-up    medication refills    Hypertension  This is a chronic problem. The problem is unchanged. The problem is controlled. Associated symptoms include anxiety and neck pain. Pertinent negatives include no blurred vision, chest pain, headaches or palpitations. Past treatments include central alpha agonists. There is no history of kidney disease, CAD/MI or CVA.  Gastroesophageal Reflux  He reports no abdominal pain, no belching, no chest pain, no heartburn, no nausea or no sore throat. This is a chronic problem. The symptoms are aggravated by certain foods (pasta, spaghetti, red suaces, pizza etc.). He has tried a PPI for the symptoms. The treatment provided significant relief.  Neck Pain   This is a chronic problem. The problem has been unchanged. The pain is at a severity of 2/10. The symptoms are aggravated by bending and position. Pertinent negatives include no chest pain, fever or headaches. He has tried oral narcotics for the symptoms. The treatment provided significant relief.  Anxiety  Presents for follow-up visit. Symptoms include excessive worry, insomnia, irritability and nervous/anxious behavior. Patient reports no chest pain, nausea, palpitations or panic. The severity of symptoms is moderate.      Past Medical History:  Diagnosis Date  . Anxiety   . Chronic, continuous use of opioids   . Diabetes ([place])   . DJD (degenerative joint disease)   . GERD (gastroesophageal reflux disease)   . History of diverticulitis   . Hypertension   . Hypertension   . Hypertriglyceridemia   . Hypokalemia   . Insomnia   . Mitral regurgitation   . Vitamin D deficiency     Past Surgical History:  Procedure Laterality Date  . CORONARY ARTERY BYPASS GRAFT  1997  . CORONARY STENT PLACEMENT  11/2006  . FOOT  SURGERY  2003  . UMBILICAL HERNIA REPAIR  04/2012    Family History  Problem Relation Age of Onset  . Throat cancer Father   . Colon cancer Father   . Stroke Brother   . Stroke Mother   . Kidney disease Mother   . Heart disease Mother   . Breast cancer Paternal Aunt     Social History   Social History  . Marital status: Married    Spouse name: N/A  . Number of children: 0  . Years of education: N/A   Occupational History  . Not on file.   Social History Main Topics  . Smoking status: Former Research scientist (life sciences)  . Smokeless tobacco: Never Used  . Alcohol use No  . Drug use: No  . Sexual activity: Yes    Partners: Female   Other Topics Concern  . Not on file   Social History Narrative  . No narrative on file     Current Outpatient Prescriptions:  .  aspirin 81 MG tablet, Take 1 tablet by mouth daily., Disp: , Rfl:  .  atorvastatin (LIPITOR) 40 MG tablet, Take 1 tablet (40 mg total) by mouth daily at 6 PM., Disp: 90 tablet, Rfl: 0 .  clonazePAM (KLONOPIN) 1 MG tablet, Take 1 tablet (1 mg total) by mouth 3 (three) times daily as needed for anxiety., Disp: 90 tablet, Rfl: 0 .  cloNIDine (CATAPRES) 0.1 MG tablet, Take 1 tablet (0.1 mg total) by mouth 2 (two) times daily., Disp:  180 tablet, Rfl: 0 .  ergocalciferol (VITAMIN D2) 50000 UNITS capsule, Take 1 capsule by mouth once a week. Reported on 04/05/2016, Disp: , Rfl:  .  glucose blood test strip, Use as instructed, Disp: 50 each, Rfl: 12 .  Icosapent Ethyl (VASCEPA) 1 G CAPS, Take 2 g by mouth 2 (two) times daily with a meal., Disp: 120 capsule, Rfl: 0 .  metFORMIN (GLUCOPHAGE-XR) 500 MG 24 hr tablet, Take 1 tablet (500 mg total) by mouth 2 (two) times daily., Disp: 180 tablet, Rfl: 0 .  nabumetone (RELAFEN) 750 MG tablet, , Disp: , Rfl:  .  omeprazole (PRILOSEC) 40 MG capsule, Take 1 capsule (40 mg total) by mouth daily., Disp: 90 capsule, Rfl: 0 .  oxyCODONE-acetaminophen (PERCOCET) 10-325 MG tablet, Take 1 tablet by mouth every  6 (six) hours as needed. Pt. Takes 5 tabs/ day, Disp: 155 tablet, Rfl: 0 .  telmisartan (MICARDIS) 40 MG tablet, Take 1 tablet (40 mg total) by mouth daily., Disp: 90 tablet, Rfl: 0 .  Vitamin D, Ergocalciferol, (DRISDOL) 50000 units CAPS capsule, Take 1 capsule (50,000 Units total) by mouth once a week. For 12 weeks, Disp: 12 capsule, Rfl: 0 .  zolpidem (AMBIEN) 10 MG tablet, Take 1 tablet (10 mg total) by mouth at bedtime., Disp: 30 tablet, Rfl: 2  Allergies  Allergen Reactions  . Ace Inhibitors Cough  . Diphenhydramine     stomach upset     Review of Systems  Constitutional: Positive for irritability. Negative for fever.  HENT: Negative for sore throat.   Eyes: Negative for blurred vision.  Cardiovascular: Negative for chest pain and palpitations.  Gastrointestinal: Negative for abdominal pain, heartburn and nausea.  Musculoskeletal: Positive for neck pain.  Neurological: Negative for headaches.  Psychiatric/Behavioral: The patient is nervous/anxious and has insomnia.       Objective  Vitals:   09/23/16 1357  BP: 112/70  Pulse: 76  Resp: 18  Temp: 98.6 F (37 C)  TempSrc: Oral  SpO2: 98%  Weight: 219 lb 12.8 oz (99.7 kg)  Height: 5\' 10"  (1.778 m)    Physical Exam  Constitutional: He is oriented to person, place, and time and well-developed, well-nourished, and in no distress.  HENT:  Head: Normocephalic and atraumatic.  Cardiovascular: Normal rate, regular rhythm, S1 normal, S2 normal and normal heart sounds.   No murmur heard. Pulmonary/Chest: Effort normal and breath sounds normal. He has no wheezes. He has no rhonchi.  Musculoskeletal:       Right ankle: He exhibits no swelling.       Left ankle: He exhibits no swelling.       Cervical back: He exhibits tenderness, pain and spasm.       Back:  Neurological: He is alert and oriented to person, place, and time.  Psychiatric: Mood, memory, affect and judgment normal.  Nursing note and vitals  reviewed.   Assessment & Plan  1. Anxiety Stable and responsive to 1 as up and taken 3 times daily when needed, patient compliant with controlled substances agreement. - clonazePAM (KLONOPIN) 1 MG tablet; Take 1 tablet (1 mg total) by mouth 3 (three) times daily as needed for anxiety.  Dispense: 90 tablet; Refill: 0  2. Chronic neck pain Chronic pain responsive to Percocet taken up to 5 times daily when needed. Refills provided and follow-up in one month - oxyCODONE-acetaminophen (PERCOCET) 10-325 MG tablet; Take 1 tablet by mouth every 6 (six) hours as needed. Pt. Takes 5 tabs/ day  Dispense:  155 tablet; Refill: 0  3. Dyslipidemia  - atorvastatin (LIPITOR) 40 MG tablet; Take 1 tablet (40 mg total) by mouth daily at 6 PM.  Dispense: 90 tablet; Refill: 0  4. Essential hypertension  - telmisartan (MICARDIS) 40 MG tablet; Take 1 tablet (40 mg total) by mouth daily.  Dispense: 90 tablet; Refill: 0  5. Gastroesophageal reflux disease, esophagitis presence not specified  - omeprazole (PRILOSEC) 40 MG capsule; Take 1 capsule (40 mg total) by mouth daily.  Dispense: 90 capsule; Refill: 0   Saba Gomm [facility] 09/23/2016 2:04 PM

## 2016-10-21 ENCOUNTER — Ambulatory Visit: Payer: Medicare Other | Admitting: Family Medicine

## 2016-10-25 ENCOUNTER — Telehealth: Payer: Self-pay | Admitting: Family Medicine

## 2016-10-25 NOTE — Telephone Encounter (Signed)
Pt had appointment for 10/21/16 however he had to reschedule for 11/02/16 due to you being out of office. Patient is requesting a refill on oxycodone.

## 2016-10-26 ENCOUNTER — Other Ambulatory Visit: Payer: Self-pay | Admitting: Emergency Medicine

## 2016-10-26 DIAGNOSIS — G8929 Other chronic pain: Secondary | ICD-10-CM

## 2016-10-26 DIAGNOSIS — M542 Cervicalgia: Secondary | ICD-10-CM

## 2016-10-26 DIAGNOSIS — F419 Anxiety disorder, unspecified: Secondary | ICD-10-CM

## 2016-10-26 MED ORDER — CLONAZEPAM 1 MG PO TABS: 1.0000 mg | ORAL_TABLET | Freq: Three times a day (TID) | ORAL | 0 refills | Status: DC | PRN

## 2016-10-26 MED ORDER — OXYCODONE-ACETAMINOPHEN 10-325 MG PO TABS: 1.0000 | ORAL_TABLET | Freq: Four times a day (QID) | ORAL | 0 refills | Status: DC | PRN

## 2016-10-26 NOTE — Telephone Encounter (Signed)
Patient notified to pick up script and make appointment  For 1 month

## 2016-11-01 DIAGNOSIS — M5416 Radiculopathy, lumbar region: Secondary | ICD-10-CM | POA: Diagnosis not present

## 2016-11-01 DIAGNOSIS — M5137 Other intervertebral disc degeneration, lumbosacral region: Secondary | ICD-10-CM | POA: Diagnosis not present

## 2016-11-02 ENCOUNTER — Ambulatory Visit: Payer: Medicare Other | Admitting: Family Medicine

## 2016-11-11 ENCOUNTER — Other Ambulatory Visit: Payer: Self-pay | Admitting: Family Medicine

## 2016-11-11 DIAGNOSIS — E119 Type 2 diabetes mellitus without complications: Secondary | ICD-10-CM

## 2016-11-24 ENCOUNTER — Ambulatory Visit (INDEPENDENT_AMBULATORY_CARE_PROVIDER_SITE_OTHER): Payer: Medicare Other | Admitting: Family Medicine

## 2016-11-24 ENCOUNTER — Encounter: Payer: Self-pay | Admitting: Family Medicine

## 2016-11-24 VITALS — BP 115/76 | HR 73 | Temp 98.1°F | Resp 16 | Ht 70.0 in | Wt 222.7 lb

## 2016-11-24 DIAGNOSIS — G8929 Other chronic pain: Secondary | ICD-10-CM

## 2016-11-24 DIAGNOSIS — M542 Cervicalgia: Secondary | ICD-10-CM

## 2016-11-24 DIAGNOSIS — E119 Type 2 diabetes mellitus without complications: Secondary | ICD-10-CM

## 2016-11-24 DIAGNOSIS — I10 Essential (primary) hypertension: Secondary | ICD-10-CM | POA: Diagnosis not present

## 2016-11-24 DIAGNOSIS — G4700 Insomnia, unspecified: Secondary | ICD-10-CM

## 2016-11-24 LAB — GLUCOSE, POCT (MANUAL RESULT ENTRY): POC GLUCOSE: 162 mg/dL — AB (ref 70–99)

## 2016-11-24 LAB — POCT GLYCOSYLATED HEMOGLOBIN (HGB A1C): Hemoglobin A1C: 6.6

## 2016-11-24 MED ORDER — ZOLPIDEM TARTRATE 10 MG PO TABS: 10.0000 mg | ORAL_TABLET | Freq: Every day | ORAL | 2 refills | Status: DC

## 2016-11-24 MED ORDER — OXYCODONE-ACETAMINOPHEN 10-325 MG PO TABS: 1.0000 | ORAL_TABLET | Freq: Four times a day (QID) | ORAL | 0 refills | Status: DC | PRN

## 2016-11-24 MED ORDER — CLONIDINE HCL 0.1 MG PO TABS: 0.1000 mg | ORAL_TABLET | Freq: Two times a day (BID) | ORAL | 0 refills | Status: DC

## 2016-11-24 NOTE — Progress Notes (Signed)
Name: Juan Le   MRN: [id number]    DOB: 1945/10/20   Date:11/24/2016       Progress Note  Subjective  Chief Complaint  Chief Complaint  Patient presents with  . Follow-up    1 mo  . Medication Refill    Neck Pain   This is a chronic problem. The problem occurs constantly. The problem has been unchanged. The pain is present in the midline. The pain is at a severity of 2/10. The symptoms are aggravated by bending and position. Pertinent negatives include no chest pain, headaches or leg pain (Right leg pain and numbness,). He has tried oral narcotics for the symptoms.  Insomnia  Primary symptoms: difficulty falling asleep, premature morning awakening.  The problem occurs nightly. The problem is unchanged. The symptoms are aggravated by pain. Past treatments include medication. Typical bedtime:  10-11 P.M..  How long after going to bed to you fall asleep: 15-30 minutes.   PMH includes: chronic pain.  Hypertension  This is a chronic problem. The problem is unchanged. Associated symptoms include neck pain. Pertinent negatives include no blurred vision, chest pain, headaches or palpitations. Past treatments include central alpha agonists. Hypertensive end-organ damage includes CAD/MI. There is no history of kidney disease or CVA.  Diabetes  He presents for his follow-up diabetic visit. He has type 2 diabetes mellitus. His disease course has been worsening. Pertinent negatives for hypoglycemia include no headaches. Pertinent negatives for diabetes include no blurred vision, no chest pain, no fatigue, no polydipsia and no polyuria. Pertinent negatives for diabetic complications include no CVA. Current diabetic treatment includes oral agent (monotherapy). He is following a generally unhealthy diet. He monitors blood glucose at home 1-2 x per week. His breakfast blood glucose range is generally 130-140 mg/dl.    Past Medical History:  Diagnosis Date  . Anxiety   . Chronic, continuous use of  opioids   . Diabetes ([place])   . DJD (degenerative joint disease)   . GERD (gastroesophageal reflux disease)   . History of diverticulitis   . Hypertension   . Hypertension   . Hypertriglyceridemia   . Hypokalemia   . Insomnia   . Mitral regurgitation   . Vitamin D deficiency     Past Surgical History:  Procedure Laterality Date  . CORONARY ARTERY BYPASS GRAFT  1997  . CORONARY STENT PLACEMENT  11/2006  . FOOT SURGERY  2003  . UMBILICAL HERNIA REPAIR  04/2012    Family History  Problem Relation Age of Onset  . Throat cancer Father   . Colon cancer Father   . Stroke Brother   . Stroke Mother   . Kidney disease Mother   . Heart disease Mother   . Breast cancer Paternal Aunt     Social History   Social History  . Marital status: Married    Spouse name: N/A  . Number of children: 0  . Years of education: N/A   Occupational History  . Not on file.   Social History Main Topics  . Smoking status: Former Research scientist (life sciences)  . Smokeless tobacco: Never Used  . Alcohol use No  . Drug use: No  . Sexual activity: Yes    Partners: Female   Other Topics Concern  . Not on file   Social History Narrative  . No narrative on file     Current Outpatient Prescriptions:  .  aspirin 81 MG tablet, Take 1 tablet by mouth daily., Disp: , Rfl:  .  atorvastatin (LIPITOR) 40 MG tablet, Take 1 tablet (40 mg total) by mouth daily at 6 PM., Disp: 90 tablet, Rfl: 0 .  clonazePAM (KLONOPIN) 1 MG tablet, Take 1 tablet (1 mg total) by mouth 3 (three) times daily as needed for anxiety., Disp: 90 tablet, Rfl: 0 .  cloNIDine (CATAPRES) 0.1 MG tablet, Take 1 tablet (0.1 mg total) by mouth 2 (two) times daily., Disp: 180 tablet, Rfl: 0 .  glucose blood test strip, Use as instructed, Disp: 50 each, Rfl: 12 .  Icosapent Ethyl (VASCEPA) 1 G CAPS, Take 2 g by mouth 2 (two) times daily with a meal., Disp: 120 capsule, Rfl: 0 .  metFORMIN (GLUCOPHAGE-XR) 500 MG 24 hr tablet, Take 1 tablet (500 mg total) by  mouth 2 (two) times daily., Disp: 180 tablet, Rfl: 0 .  omeprazole (PRILOSEC) 40 MG capsule, Take 1 capsule (40 mg total) by mouth daily., Disp: 90 capsule, Rfl: 0 .  oxyCODONE-acetaminophen (PERCOCET) 10-325 MG tablet, Take 1 tablet by mouth every 6 (six) hours as needed. Pt. Takes 5 tabs/ day, Disp: 155 tablet, Rfl: 0 .  telmisartan (MICARDIS) 40 MG tablet, Take 1 tablet (40 mg total) by mouth daily., Disp: 90 tablet, Rfl: 0 .  zolpidem (AMBIEN) 10 MG tablet, Take 1 tablet (10 mg total) by mouth at bedtime., Disp: 30 tablet, Rfl: 2 .  ergocalciferol (VITAMIN D2) 50000 UNITS capsule, Take 1 capsule by mouth once a week. Reported on 04/05/2016, Disp: , Rfl:  .  nabumetone (RELAFEN) 750 MG tablet, , Disp: , Rfl:  .  Vitamin D, Ergocalciferol, (DRISDOL) 50000 units CAPS capsule, Take 1 capsule (50,000 Units total) by mouth once a week. For 12 weeks (Patient not taking: Reported on 11/24/2016), Disp: 12 capsule, Rfl: 0  Allergies  Allergen Reactions  . Ace Inhibitors Cough  . Diphenhydramine     stomach upset    Review of Systems  Constitutional: Negative for fatigue.  Eyes: Negative for blurred vision.  Cardiovascular: Negative for chest pain and palpitations.  Musculoskeletal: Positive for neck pain.  Neurological: Negative for headaches.  Endo/Heme/Allergies: Negative for polydipsia.     Objective  Vitals:   11/24/16 0812  BP: 115/76  Pulse: 73  Resp: 16  Temp: 98.1 F (36.7 C)  TempSrc: Oral  SpO2: 95%  Weight: 222 lb 11.2 oz (101 kg)  Height: 5\' 10"  (1.778 m)    Physical Exam  Constitutional: He is oriented to person, place, and time and well-developed, well-nourished, and in no distress.  Cardiovascular: Normal rate, regular rhythm, S1 normal, S2 normal and normal heart sounds.   No murmur heard. Pulmonary/Chest: Effort normal and breath sounds normal. He has no wheezes. He has no rhonchi.  Musculoskeletal:       Right ankle: He exhibits no swelling.       Left ankle:  He exhibits no swelling.       Cervical back: He exhibits tenderness, pain and spasm.       Back:  Neurological: He is alert and oriented to person, place, and time.  Psychiatric: Mood, memory, affect and judgment normal.  Nursing note and vitals reviewed.     Assessment & Plan  1. Chronic neck pain Stable and responsive to opioid therapy, Percocet also help with low back pain, compliant with controlled substances agreement. Refills provided and follow up in 1 month. - oxyCODONE-acetaminophen (PERCOCET) 10-325 MG tablet; Take 1 tablet by mouth every 6 (six) hours as needed. Pt. Takes 5 tabs/ day  Dispense:  155 tablet; Refill: 0  2. Essential hypertension  - cloNIDine (CATAPRES) 0.1 MG tablet; Take 1 tablet (0.1 mg total) by mouth 2 (two) times daily.  Dispense: 180 tablet; Refill: 0  3. Insomnia, unspecified type  - zolpidem (AMBIEN) 10 MG tablet; Take 1 tablet (10 mg total) by mouth at bedtime.  Dispense: 30 tablet; Refill: 2  4. Type 2 diabetes mellitus without complication, without long-term current use of insulin (HCC) A1c is 6.6%, c/w well controlled Diabetes. - POCT Glucose (CBG) - POCT HgB A1C  Didi Ganaway [facility] 11/24/2016 8:38 AM

## 2016-12-09 ENCOUNTER — Other Ambulatory Visit: Payer: Self-pay | Admitting: Family Medicine

## 2016-12-09 DIAGNOSIS — K219 Gastro-esophageal reflux disease without esophagitis: Secondary | ICD-10-CM

## 2016-12-09 NOTE — Telephone Encounter (Signed)
Medication has been refilled and sent to [facility]

## 2016-12-23 ENCOUNTER — Ambulatory Visit (INDEPENDENT_AMBULATORY_CARE_PROVIDER_SITE_OTHER): Payer: Medicare Other | Admitting: Family Medicine

## 2016-12-23 ENCOUNTER — Ambulatory Visit: Payer: Medicare Other | Admitting: Family Medicine

## 2016-12-23 ENCOUNTER — Encounter: Payer: Self-pay | Admitting: Family Medicine

## 2016-12-23 DIAGNOSIS — G8929 Other chronic pain: Secondary | ICD-10-CM

## 2016-12-23 DIAGNOSIS — E785 Hyperlipidemia, unspecified: Secondary | ICD-10-CM

## 2016-12-23 DIAGNOSIS — F419 Anxiety disorder, unspecified: Secondary | ICD-10-CM

## 2016-12-23 DIAGNOSIS — I10 Essential (primary) hypertension: Secondary | ICD-10-CM

## 2016-12-23 DIAGNOSIS — M542 Cervicalgia: Secondary | ICD-10-CM

## 2016-12-23 MED ORDER — ATORVASTATIN CALCIUM 40 MG PO TABS: 40.0000 mg | ORAL_TABLET | Freq: Every day | ORAL | 0 refills | Status: DC

## 2016-12-23 MED ORDER — TELMISARTAN 40 MG PO TABS: 40.0000 mg | ORAL_TABLET | Freq: Every day | ORAL | 0 refills | Status: DC

## 2016-12-23 MED ORDER — OXYCODONE-ACETAMINOPHEN 10-325 MG PO TABS: 1.0000 | ORAL_TABLET | Freq: Four times a day (QID) | ORAL | 0 refills | Status: DC | PRN

## 2016-12-23 MED ORDER — CLONAZEPAM 1 MG PO TABS: 1.0000 mg | ORAL_TABLET | Freq: Three times a day (TID) | ORAL | 0 refills | Status: DC | PRN

## 2016-12-23 NOTE — Progress Notes (Signed)
Name: Juan Le   MRN: [id number]    DOB: 04/02/1946   Date:12/23/2016       Progress Note  Subjective  Chief Complaint  Chief Complaint  Patient presents with  . Medication Refill    Neck Pain   This is a chronic problem. The problem occurs constantly. The problem has been unchanged. The pain is present in the midline. The pain is at a severity of 2/10. The symptoms are aggravated by bending and position (Pain worse in the morning when he wakes up and twists his neck, recently worse when he is having to attend to household duties after wife had surgery). Pertinent negatives include no chest pain, headaches or leg pain. He has tried oral narcotics for the symptoms.  Anxiety  Presents for follow-up visit. The problem has been unchanged. Symptoms include excessive worry and nervous/anxious behavior. Patient reports no chest pain, depressed mood, insomnia, muscle tension, palpitations, panic or shortness of breath. Symptoms occur most days. The severity of symptoms is severe and causing significant distress.   Past treatments include benzodiazephines. The treatment provided significant relief. Compliance with prior treatments has been good.  Hypertension  This is a chronic problem. The problem is unchanged. Associated symptoms include anxiety and neck pain (chronic neck pain). Pertinent negatives include no chest pain, headaches, palpitations or shortness of breath. Past treatments include central alpha agonists. Hypertensive end-organ damage includes CAD/MI. There is no history of kidney disease or CVA.     Past Medical History:  Diagnosis Date  . Anxiety   . Chronic, continuous use of opioids   . Diabetes ([place])   . DJD (degenerative joint disease)   . GERD (gastroesophageal reflux disease)   . History of diverticulitis   . Hypertension   . Hypertension   . Hypertriglyceridemia   . Hypokalemia   . Insomnia   . Mitral regurgitation   . Vitamin D deficiency     Past  Surgical History:  Procedure Laterality Date  . CORONARY ARTERY BYPASS GRAFT  1997  . CORONARY STENT PLACEMENT  11/2006  . FOOT SURGERY  2003  . UMBILICAL HERNIA REPAIR  04/2012    Family History  Problem Relation Age of Onset  . Throat cancer Father   . Colon cancer Father   . Stroke Brother   . Stroke Mother   . Kidney disease Mother   . Heart disease Mother   . Breast cancer Paternal Aunt     Social History   Social History  . Marital status: Married    Spouse name: N/A  . Number of children: 0  . Years of education: N/A   Occupational History  . Not on file.   Social History Main Topics  . Smoking status: Former Research scientist (life sciences)  . Smokeless tobacco: Never Used  . Alcohol use No  . Drug use: No  . Sexual activity: Yes    Partners: Female   Other Topics Concern  . Not on file   Social History Narrative  . No narrative on file     Current Outpatient Prescriptions:  .  aspirin 81 MG tablet, Take 1 tablet by mouth daily., Disp: , Rfl:  .  atorvastatin (LIPITOR) 40 MG tablet, Take 1 tablet (40 mg total) by mouth daily at 6 PM., Disp: 90 tablet, Rfl: 0 .  clonazePAM (KLONOPIN) 1 MG tablet, Take 1 tablet (1 mg total) by mouth 3 (three) times daily as needed for anxiety., Disp: 90 tablet, Rfl: 0 .  cloNIDine (  CATAPRES) 0.1 MG tablet, Take 1 tablet (0.1 mg total) by mouth 2 (two) times daily., Disp: 180 tablet, Rfl: 0 .  ergocalciferol (VITAMIN D2) 50000 UNITS capsule, Take 1 capsule by mouth once a week. Reported on 04/05/2016, Disp: , Rfl:  .  glucose blood test strip, Use as instructed, Disp: 50 each, Rfl: 12 .  Icosapent Ethyl (VASCEPA) 1 G CAPS, Take 2 g by mouth 2 (two) times daily with a meal., Disp: 120 capsule, Rfl: 0 .  metFORMIN (GLUCOPHAGE-XR) 500 MG 24 hr tablet, Take 1 tablet (500 mg total) by mouth 2 (two) times daily., Disp: 180 tablet, Rfl: 0 .  nabumetone (RELAFEN) 750 MG tablet, , Disp: , Rfl:  .  omeprazole (PRILOSEC) 40 MG capsule, Take 1 capsule (40 mg  total) by mouth daily., Disp: 90 capsule, Rfl: 0 .  oxyCODONE-acetaminophen (PERCOCET) 10-325 MG tablet, Take 1 tablet by mouth every 6 (six) hours as needed. Pt. Takes 5 tabs/ day, Disp: 155 tablet, Rfl: 0 .  telmisartan (MICARDIS) 40 MG tablet, Take 1 tablet (40 mg total) by mouth daily., Disp: 90 tablet, Rfl: 0 .  Vitamin D, Ergocalciferol, (DRISDOL) 50000 units CAPS capsule, Take 1 capsule (50,000 Units total) by mouth once a week. For 12 weeks, Disp: 12 capsule, Rfl: 0 .  zolpidem (AMBIEN) 10 MG tablet, Take 1 tablet (10 mg total) by mouth at bedtime., Disp: 30 tablet, Rfl: 2  Allergies  Allergen Reactions  . Ace Inhibitors Cough  . Diphenhydramine     stomach upset     Review of Systems  Respiratory: Negative for shortness of breath.   Cardiovascular: Negative for chest pain and palpitations.  Musculoskeletal: Positive for neck pain (chronic neck pain).  Neurological: Negative for headaches.  Psychiatric/Behavioral: The patient is nervous/anxious. The patient does not have insomnia.     Objective  Vitals:   12/23/16 0852  BP: 118/72  Pulse: 71  Resp: 18  Temp: 98.9 F (37.2 C)  TempSrc: Oral  SpO2: 97%  Weight: 225 lb 3.2 oz (102.2 kg)  Height: 5\' 10"  (1.778 m)    Physical Exam  Constitutional: He is oriented to person, place, and time and well-developed, well-nourished, and in no distress.  HENT:  Head: Normocephalic and atraumatic.  Cardiovascular: Normal rate, regular rhythm, S1 normal, S2 normal and normal heart sounds.   No murmur heard. Pulmonary/Chest: Effort normal and breath sounds normal. He has no wheezes. He has no rhonchi.  Musculoskeletal:       Right ankle: He exhibits no swelling.       Left ankle: He exhibits no swelling.       Cervical back: He exhibits tenderness, pain and spasm.       Thoracic back: He exhibits tenderness, pain and spasm.       Back:  Neurological: He is alert and oriented to person, place, and time.  Psychiatric: Mood,  memory, affect and judgment normal.  Nursing note and vitals reviewed.      Recent Results (from the past 2160 hour(s))  POCT Glucose (CBG)     Status: Abnormal   Collection Time: 11/24/16  8:52 AM  Result Value Ref Range   POC Glucose 162 (A) 70 - 99 mg/dl  POCT HgB A1C     Status: Abnormal   Collection Time: 11/24/16  8:52 AM  Result Value Ref Range   Hemoglobin A1C 6.6      Assessment & Plan  1. Chronic neck pain Stable and responsive to Percocet taken  every 6 hours as needed, refills provided - oxyCODONE-acetaminophen (PERCOCET) 10-325 MG tablet; Take 1 tablet by mouth every 6 (six) hours as needed. Pt. Takes 5 tabs/ day  Dispense: 155 tablet; Refill: 0  2. Anxiety  symptoms of anxiety are responsive to clonazepam taken up to 3 times daily when needed, patient compliant with controlled substances agreement. Refills provided  -ClonazePAM (KLONOPIN) 1 MG tablet; Take 1 tablet (1 mg total) by mouth 3 (three) times daily as needed for anxiety.  Dispense: 90 tablet; Refill: 0  3. Dyslipidemia - atorvastatin (LIPITOR) 40 MG tablet; Take 1 tablet (40 mg total) by mouth daily at 6 PM.  Dispense: 90 tablet; Refill: 0  4. Essential hypertension  - telmisartan (MICARDIS) 40 MG tablet; Take 1 tablet (40 mg total) by mouth daily.  Dispense: 90 tablet; Refill: 0   Jaykob Minichiello [facility] 12/23/2016 9:20 AM

## 2017-01-17 ENCOUNTER — Ambulatory Visit: Payer: Medicare Other | Admitting: Family Medicine

## 2017-01-17 ENCOUNTER — Ambulatory Visit (INDEPENDENT_AMBULATORY_CARE_PROVIDER_SITE_OTHER): Payer: Medicare Other

## 2017-01-17 VITALS — BP 136/80 | HR 76 | Temp 99.1°F | Ht 70.0 in | Wt 224.6 lb

## 2017-01-17 DIAGNOSIS — Z0000 Encounter for general adult medical examination without abnormal findings: Secondary | ICD-10-CM

## 2017-01-17 DIAGNOSIS — Z1159 Encounter for screening for other viral diseases: Secondary | ICD-10-CM

## 2017-01-17 DIAGNOSIS — Z1211 Encounter for screening for malignant neoplasm of colon: Secondary | ICD-10-CM

## 2017-01-17 DIAGNOSIS — N429 Disorder of prostate, unspecified: Secondary | ICD-10-CM | POA: Diagnosis not present

## 2017-01-17 DIAGNOSIS — E559 Vitamin D deficiency, unspecified: Secondary | ICD-10-CM | POA: Diagnosis not present

## 2017-01-17 DIAGNOSIS — I10 Essential (primary) hypertension: Secondary | ICD-10-CM | POA: Diagnosis not present

## 2017-01-17 LAB — HEPATITIS C ANTIBODY: HCV Ab: NEGATIVE

## 2017-01-17 NOTE — Progress Notes (Signed)
Name: Juan Le   MRN: [id number]    DOB: 1945/12/08   Date:01/18/2017       Progress Note  Subjective  Chief Complaint  No chief complaint on file.   HPI  Pt. Presents for Medicare Physical Part 2.  Last colonoscopy was August 2012, records are not available. Last Prostate exam was in 2012.   Past Medical History:  Diagnosis Date  . Anxiety   . Chronic, continuous use of opioids   . Degenerative disc disease, lumbar   . Diabetes ([place])   . DJD (degenerative joint disease)   . GERD (gastroesophageal reflux disease)   . History of diverticulitis   . Hypertension   . Hypertension   . Hypertriglyceridemia   . Hypokalemia   . Insomnia   . Mitral regurgitation   . Vitamin D deficiency     Past Surgical History:  Procedure Laterality Date  . CORONARY ARTERY BYPASS GRAFT  1997  . CORONARY STENT PLACEMENT  11/2006  . FOOT SURGERY  2003  . UMBILICAL HERNIA REPAIR  04/2012    Family History  Problem Relation Age of Onset  . Throat cancer Father   . Colon cancer Father   . Stroke Brother   . Stroke Mother   . Kidney disease Mother   . Heart disease Mother   . Breast cancer Paternal Aunt     Social History   Social History  . Marital status: Married    Spouse name: N/A  . Number of children: 0  . Years of education: N/A   Occupational History  . Not on file.   Social History Main Topics  . Smoking status: Former Smoker    Types: Cigarettes  . Smokeless tobacco: Never Used     Comment: quit in teens  . Alcohol use No  . Drug use: No  . Sexual activity: Yes    Partners: Female   Other Topics Concern  . Not on file   Social History Narrative  . No narrative on file     Current Outpatient Prescriptions:  .  aspirin 81 MG tablet, Take 1 tablet by mouth daily., Disp: , Rfl:  .  atorvastatin (LIPITOR) 40 MG tablet, Take 1 tablet (40 mg total) by mouth daily at 6 PM., Disp: 90 tablet, Rfl: 0 .  clonazePAM (KLONOPIN) 1 MG tablet, Take 1 tablet (1  mg total) by mouth 3 (three) times daily as needed for anxiety., Disp: 90 tablet, Rfl: 0 .  cloNIDine (CATAPRES) 0.1 MG tablet, Take 1 tablet (0.1 mg total) by mouth 2 (two) times daily., Disp: 180 tablet, Rfl: 0 .  ergocalciferol (VITAMIN D2) 50000 UNITS capsule, Take 1 capsule by mouth once a week. Reported on 04/05/2016, Disp: , Rfl:  .  glucose blood test strip, Use as instructed, Disp: 50 each, Rfl: 12 .  Icosapent Ethyl (VASCEPA) 1 G CAPS, Take 2 g by mouth 2 (two) times daily with a meal. (Patient not taking: Reported on 01/17/2017), Disp: 120 capsule, Rfl: 0 .  metFORMIN (GLUCOPHAGE-XR) 500 MG 24 hr tablet, Take 1 tablet (500 mg total) by mouth 2 (two) times daily., Disp: 180 tablet, Rfl: 0 .  nabumetone (RELAFEN) 750 MG tablet, , Disp: , Rfl:  .  Omega-3 Fatty Acids (FISH OIL) 1000 MG CAPS, Take 2 capsules by mouth daily., Disp: , Rfl:  .  omeprazole (PRILOSEC) 40 MG capsule, Take 1 capsule (40 mg total) by mouth daily., Disp: 90 capsule, Rfl: 0 .  oxyCODONE-acetaminophen (  PERCOCET) 10-325 MG tablet, Take 1 tablet by mouth every 6 (six) hours as needed. Pt. Takes 5 tabs/ day, Disp: 155 tablet, Rfl: 0 .  telmisartan (MICARDIS) 40 MG tablet, Take 1 tablet (40 mg total) by mouth daily., Disp: 90 tablet, Rfl: 0 .  Vitamin D, Ergocalciferol, (DRISDOL) 50000 units CAPS capsule, Take 1 capsule (50,000 Units total) by mouth once a week. For 12 weeks (Patient not taking: Reported on 01/17/2017), Disp: 12 capsule, Rfl: 0 .  zolpidem (AMBIEN) 10 MG tablet, Take 1 tablet (10 mg total) by mouth at bedtime., Disp: 30 tablet, Rfl: 2  Allergies  Allergen Reactions  . Ace Inhibitors Cough  . Diphenhydramine     stomach upset     Review of Systems  Constitutional: Negative for chills, fever and malaise/fatigue.  HENT: Negative for congestion, ear pain and sore throat.   Eyes: Negative for blurred vision and double vision.  Respiratory: Negative for cough and shortness of breath.   Cardiovascular:  Negative for chest pain, palpitations and leg swelling.  Gastrointestinal: Negative for abdominal pain, blood in stool, heartburn, nausea and vomiting.  Genitourinary: Negative for dysuria and hematuria.  Musculoskeletal: Positive for back pain.  Neurological: Negative for dizziness and headaches.  Psychiatric/Behavioral: Positive for depression. The patient is nervous/anxious and has insomnia.     Objective  There were no vitals filed for this visit.  Physical Exam  Constitutional: He is oriented to person, place, and time and well-developed, well-nourished, and in no distress.  HENT:  Head: Normocephalic and atraumatic.  Cardiovascular: Normal rate, regular rhythm and normal heart sounds.   No murmur heard. Pulmonary/Chest: Effort normal and breath sounds normal. He has no wheezes.  Abdominal: Soft. Bowel sounds are normal. There is no tenderness.  Genitourinary: Rectum normal and prostate normal. Rectal exam shows no tenderness. Prostate is not enlarged (minimally enlarged, smooth round, non tender) and not tender.  Musculoskeletal:       Right ankle: He exhibits no swelling.       Left ankle: He exhibits no swelling.  Neurological: He is alert and oriented to person, place, and time.  Skin: Skin is warm, dry and intact.  Psychiatric: Mood, memory, affect and judgment normal.  Nursing note and vitals reviewed.     Assessment & Plan  1. Encounter for annual physical exam Obtain age-appropriate lab work - PSA - TSH - VITAMIN D 25 Hydroxy (Vit-D Deficiency, Fractures)  2. Encounter for screening colonoscopy  - Ambulatory referral to Gastroenterology   Tammey Deeg Asad A. [facility] 01/18/2017 7:00 PM

## 2017-01-17 NOTE — Patient Instructions (Signed)
Juan Le , Thank you for taking time to come for your Medicare Wellness Visit. I appreciate your ongoing commitment to your health goals. Please review the following plan we discussed and let me know if I can assist you in the future.   Screening recommendations/referrals: Colonoscopy: last 06/10/11 Recommended yearly ophthalmology/optometry visit for glaucoma screening and checkup Recommended yearly dental visit for hygiene and checkup  Vaccinations: Influenza vaccine: done 07/29/16 Pneumococcal vaccine: completed series Tdap vaccine: last 05/29/14   Advanced directives: Requested a copy  Next appointment: 01/21/17  Preventive Care 71 Years and Older, Male Preventive care refers to lifestyle choices and visits with your health care provider that can promote health and wellness. What does preventive care include?  A yearly physical exam. This is also called an annual well check.  Dental exams once or twice a year.  Routine eye exams. Ask your health care provider how often you should have your eyes checked.  Personal lifestyle choices, including:  Daily care of your teeth and gums.  Regular physical activity.  Eating a healthy diet.  Avoiding tobacco and drug use.  Limiting alcohol use.  Practicing safe sex.  Taking low doses of aspirin every day.  Taking vitamin and mineral supplements as recommended by your health care provider. What happens during an annual well check? The services and screenings done by your health care provider during your annual well check will depend on your age, overall health, lifestyle risk factors, and family history of disease. Counseling  Your health care provider may ask you questions about your:  Alcohol use.  Tobacco use.  Drug use.  Emotional well-being.  Home and relationship well-being.  Sexual activity.  Eating habits.  History of falls.  Memory and ability to understand (cognition).  Work and work  Statistician. Screening  You may have the following tests or measurements:  Height, weight, and BMI.  Blood pressure.  Lipid and cholesterol levels. These may be checked every 5 years, or more frequently if you are over 71 years old.  Skin check.  Lung cancer screening. You may have this screening every year starting at age 71 if you have a 30-pack-year history of smoking and currently smoke or have quit within the past 15 years.  Fecal occult blood test (FOBT) of the stool. You may have this test every year starting at age 71.  Flexible sigmoidoscopy or colonoscopy. You may have a sigmoidoscopy every 5 years or a colonoscopy every 10 years starting at age 71.  Prostate cancer screening. Recommendations will vary depending on your family history and other risks.  Hepatitis C blood test.  Hepatitis B blood test.  Sexually transmitted disease (STD) testing.  Diabetes screening. This is done by checking your blood sugar (glucose) after you have not eaten for a while (fasting). You may have this done every 1-3 years.  Abdominal aortic aneurysm (AAA) screening. You may need this if you are a current or former smoker.  Osteoporosis. You may be screened starting at age 71 if you are at high risk. Talk with your health care provider about your test results, treatment options, and if necessary, the need for more tests. Vaccines  Your health care provider may recommend certain vaccines, such as:  Influenza vaccine. This is recommended every year.  Tetanus, diphtheria, and acellular pertussis (Tdap, Td) vaccine. You may need a Td booster every 10 years.  Zoster vaccine. You may need this after age 71.  Pneumococcal 13-valent conjugate (PCV13) vaccine. One dose is  recommended after age 71.  Pneumococcal polysaccharide (PPSV23) vaccine. One dose is recommended after age 71. Talk to your health care provider about which screenings and vaccines you need and how often you need them. This  information is not intended to replace advice given to you by your health care provider. Make sure you discuss any questions you have with your health care provider. Document Released: 10/31/2015 Document Revised: 06/23/2016 Document Reviewed: 08/05/2015 Elsevier Interactive Patient Education  2017 [place] Prevention in the Home Falls can cause injuries. They can happen to people of all ages. There are many things you can do to make your home safe and to help prevent falls. What can I do on the outside of my home?  Regularly fix the edges of walkways and driveways and fix any cracks.  Remove anything that might make you trip as you walk through a door, such as a raised step or threshold.  Trim any bushes or trees on the path to your home.  Use bright outdoor lighting.  Clear any walking paths of anything that might make someone trip, such as rocks or tools.  Regularly check to see if handrails are loose or broken. Make sure that both sides of any steps have handrails.  Any raised decks and porches should have guardrails on the edges.  Have any leaves, snow, or ice cleared regularly.  Use sand or salt on walking paths during winter.  Clean up any spills in your garage right away. This includes oil or grease spills. What can I do in the bathroom?  Use night lights.  Install grab bars by the toilet and in the tub and shower. Do not use towel bars as grab bars.  Use non-skid mats or decals in the tub or shower.  If you need to sit down in the shower, use a plastic, non-slip stool.  Keep the floor dry. Clean up any water that spills on the floor as soon as it happens.  Remove soap buildup in the tub or shower regularly.  Attach bath mats securely with double-sided non-slip rug tape.  Do not have throw rugs and other things on the floor that can make you trip. What can I do in the bedroom?  Use night lights.  Make sure that you have a light by your bed that is  easy to reach.  Do not use any sheets or blankets that are too big for your bed. They should not hang down onto the floor.  Have a firm chair that has side arms. You can use this for support while you get dressed.  Do not have throw rugs and other things on the floor that can make you trip. What can I do in the kitchen?  Clean up any spills right away.  Avoid walking on wet floors.  Keep items that you use a lot in easy-to-reach places.  If you need to reach something above you, use a strong step stool that has a grab bar.  Keep electrical cords out of the way.  Do not use floor polish or wax that makes floors slippery. If you must use wax, use non-skid floor wax.  Do not have throw rugs and other things on the floor that can make you trip. What can I do with my stairs?  Do not leave any items on the stairs.  Make sure that there are handrails on both sides of the stairs and use them. Fix handrails that are broken or loose. Make  sure that handrails are as long as the stairways.  Check any carpeting to make sure that it is firmly attached to the stairs. Fix any carpet that is loose or worn.  Avoid having throw rugs at the top or bottom of the stairs. If you do have throw rugs, attach them to the floor with carpet tape.  Make sure that you have a light switch at the top of the stairs and the bottom of the stairs. If you do not have them, ask someone to add them for you. What else can I do to help prevent falls?  Wear shoes that:  Do not have high heels.  Have rubber bottoms.  Are comfortable and fit you well.  Are closed at the toe. Do not wear sandals.  If you use a stepladder:  Make sure that it is fully opened. Do not climb a closed stepladder.  Make sure that both sides of the stepladder are locked into place.  Ask someone to hold it for you, if possible.  Clearly mark and make sure that you can see:  Any grab bars or handrails.  First and last  steps.  Where the edge of each step is.  Use tools that help you move around (mobility aids) if they are needed. These include:  Canes.  Walkers.  Scooters.  Crutches.  Turn on the lights when you go into a dark area. Replace any light bulbs as soon as they burn out.  Set up your furniture so you have a clear path. Avoid moving your furniture around.  If any of your floors are uneven, fix them.  If there are any pets around you, be aware of where they are.  Review your medicines with your doctor. Some medicines can make you feel dizzy. This can increase your chance of falling. Ask your doctor what other things that you can do to help prevent falls. This information is not intended to replace advice given to you by your health care provider. Make sure you discuss any questions you have with your health care provider. Document Released: 07/31/2009 Document Revised: 03/11/2016 Document Reviewed: 11/08/2014 Elsevier Interactive Patient Education  2017 [employer].

## 2017-01-17 NOTE — Progress Notes (Signed)
Subjective:   Juan Le is a 71 y.o. male who presents for Medicare Annual/Subsequent preventive examination.  Review of Systems:   Cardiac Risk Factors include: advanced age (>22men, >84 women);diabetes mellitus;dyslipidemia;hypertension;male gender;obesity (BMI >30kg/m2)     Objective:    Vitals: BP 136/80 (BP Location: Left Arm)   Pulse 76   Temp 99.1 F (37.3 C) (Oral)   Ht 5\' 10"  (1.778 m)   Wt 224 lb 9.6 oz (101.9 kg)   BMI 32.23 kg/m   Body mass index is 32.23 kg/m.  Tobacco History  Smoking Status  . Former Smoker  . Types: Cigarettes  Smokeless Tobacco  . Never Used    Comment: quit in teens     Counseling given: Not Answered   Past Medical History:  Diagnosis Date  . Anxiety   . Chronic, continuous use of opioids   . Degenerative disc disease, lumbar   . Diabetes ([place])   . DJD (degenerative joint disease)   . GERD (gastroesophageal reflux disease)   . History of diverticulitis   . Hypertension   . Hypertension   . Hypertriglyceridemia   . Hypokalemia   . Insomnia   . Mitral regurgitation   . Vitamin D deficiency    Past Surgical History:  Procedure Laterality Date  . CORONARY ARTERY BYPASS GRAFT  1997  . CORONARY STENT PLACEMENT  11/2006  . FOOT SURGERY  2003  . UMBILICAL HERNIA REPAIR  04/2012   Family History  Problem Relation Age of Onset  . Throat cancer Father   . Colon cancer Father   . Stroke Brother   . Stroke Mother   . Kidney disease Mother   . Heart disease Mother   . Breast cancer Paternal Aunt    History  Sexual Activity  . Sexual activity: Yes  . Partners: Female    Outpatient Encounter Prescriptions as of 01/17/2017  Medication Sig  . aspirin 81 MG tablet Take 1 tablet by mouth daily.  Marland Kitchen atorvastatin (LIPITOR) 40 MG tablet Take 1 tablet (40 mg total) by mouth daily at 6 PM.  . clonazePAM (KLONOPIN) 1 MG tablet Take 1 tablet (1 mg total) by mouth 3 (three) times daily as needed for anxiety.  . cloNIDine  (CATAPRES) 0.1 MG tablet Take 1 tablet (0.1 mg total) by mouth 2 (two) times daily.  Marland Kitchen glucose blood test strip Use as instructed  . metFORMIN (GLUCOPHAGE-XR) 500 MG 24 hr tablet Take 1 tablet (500 mg total) by mouth 2 (two) times daily.  . Omega-3 Fatty Acids (FISH OIL) 1000 MG CAPS Take 2 capsules by mouth daily.  Marland Kitchen omeprazole (PRILOSEC) 40 MG capsule Take 1 capsule (40 mg total) by mouth daily.  Marland Kitchen oxyCODONE-acetaminophen (PERCOCET) 10-325 MG tablet Take 1 tablet by mouth every 6 (six) hours as needed. Pt. Takes 5 tabs/ day  . telmisartan (MICARDIS) 40 MG tablet Take 1 tablet (40 mg total) by mouth daily.  Marland Kitchen zolpidem (AMBIEN) 10 MG tablet Take 1 tablet (10 mg total) by mouth at bedtime.  . ergocalciferol (VITAMIN D2) 50000 UNITS capsule Take 1 capsule by mouth once a week. Reported on 04/05/2016  . Icosapent Ethyl (VASCEPA) 1 G CAPS Take 2 g by mouth 2 (two) times daily with a meal. (Patient not taking: Reported on 01/17/2017)  . nabumetone (RELAFEN) 750 MG tablet   . Vitamin D, Ergocalciferol, (DRISDOL) 50000 units CAPS capsule Take 1 capsule (50,000 Units total) by mouth once a week. For 12 weeks (Patient not  taking: Reported on 01/17/2017)   No facility-administered encounter medications on file as of 01/17/2017.     Activities of Daily Living In your present state of health, do you have any difficulty performing the following activities: 01/17/2017 11/24/2016  Hearing? N N  Vision? N Y  Difficulty concentrating or making decisions? Y N  Walking or climbing stairs? N N  Dressing or bathing? N N  Doing errands, shopping? N N  Preparing Food and eating ? N -  Using the Toilet? N -  In the past six months, have you accidently leaked urine? N -  Do you have problems with loss of bowel control? N -  Managing your Medications? N -  Managing your Finances? N -  Housekeeping or managing your Housekeeping? N -  Some recent data might be hidden    Patient Care Team: Roselee Nova, MD as PCP -  General (Family Medicine) Max Villa Herb, DPM as Consulting Physician (Podiatry) D Orion Modest, OD as Consulting Physician (Optometry) Dionisio David, MD as Consulting Physician (Cardiology)   Assessment:     Exercise Activities and Dietary recommendations Current Exercise Habits: Home exercise routine, Type of exercise: treadmill;walking, Time (Minutes): 15, Frequency (Times/Week): 2 (to 3 days a week), Weekly Exercise (Minutes/Week): 30, Intensity: Mild  Goals    . Increase water intake          Recommend increasing water intake to at least 3 glasses a day.      Fall Risk Fall Risk  01/17/2017 11/24/2016 07/29/2016 05/06/2016 04/05/2016  Falls in the past year? No No No No No   Depression Screen PHQ 2/9 Scores 01/17/2017 11/24/2016 07/29/2016 06/29/2016  PHQ - 2 Score 0 0 0 0    Cognitive Function     6CIT Screen 01/17/2017  What Year? 0 points  What month? 0 points  What time? 0 points  Count back from 20 0 points  Months in reverse 4 points  Repeat phrase 2 points  Total Score 6    Immunization History  Administered Date(s) Administered  . Influenza, High Dose Seasonal PF 07/17/2015, 07/29/2016  . Pneumococcal Conjugate-13 08/15/2014  . Pneumococcal Polysaccharide-23 05/11/2012  . Tdap 05/29/2014   Screening Tests Health Maintenance  Topic Date Due  . FOOT EXAM  04/15/2016  . OPHTHALMOLOGY EXAM  12/16/2017 (Originally 02/21/1956)  . INFLUENZA VACCINE  05/18/2017  . HEMOGLOBIN A1C  05/24/2017  . COLONOSCOPY  06/09/2021  . TETANUS/TDAP  05/29/2024  . Hepatitis C Screening  Completed  . PNA vac Low Risk Adult  Completed      Plan:  I have personally reviewed and addressed the Medicare Annual Wellness questionnaire and have noted the following in the patient's chart:  A. Medical and social history B. Use of alcohol, tobacco or illicit drugs  C. Current medications and supplements D. Functional ability and status E.  Nutritional status F.  Physical activity G. Advance  directives H. List of other physicians I.  Hospitalizations, surgeries, and ER visits in previous 12 months J.  [place] such as hearing and vision if needed, cognitive and depression L. Referrals and appointments - none  In addition, I have reviewed and discussed with patient certain preventive protocols, quality metrics, and best practice recommendations. A written personalized care plan for preventive services as well as general preventive health recommendations were provided to patient.  See attached scanned questionnaire for additional information.   Signed,  Fabio Neighbors, LPN Nurse Health Advisor   MD  Recommendations: Pt needs diabetic foot exam today.  I, as supervising physician, have reviewed the nurse health advisor's Medicare Wellness Visit note for this patient and concur with the findings and recommendations listed above.  Signed [name] A. Manuella Ghazi MD Attending Physician.

## 2017-01-18 LAB — VITAMIN D 25 HYDROXY (VIT D DEFICIENCY, FRACTURES): VIT D 25 HYDROXY: 14 ng/mL — AB (ref 30–100)

## 2017-01-18 LAB — PSA: PSA: 3.4 ng/mL (ref <=4.0)

## 2017-01-18 LAB — TSH: TSH: 2.6 mIU/L (ref 0.40–4.50)

## 2017-01-20 ENCOUNTER — Telehealth: Payer: Self-pay

## 2017-01-20 ENCOUNTER — Other Ambulatory Visit: Payer: Self-pay

## 2017-01-20 DIAGNOSIS — M4802 Spinal stenosis, cervical region: Secondary | ICD-10-CM | POA: Insufficient documentation

## 2017-01-20 DIAGNOSIS — L309 Dermatitis, unspecified: Secondary | ICD-10-CM | POA: Insufficient documentation

## 2017-01-20 DIAGNOSIS — K148 Other diseases of tongue: Secondary | ICD-10-CM | POA: Insufficient documentation

## 2017-01-20 DIAGNOSIS — I2510 Atherosclerotic heart disease of native coronary artery without angina pectoris: Secondary | ICD-10-CM | POA: Insufficient documentation

## 2017-01-20 DIAGNOSIS — Z860100 Personal history of colon polyps, unspecified: Secondary | ICD-10-CM

## 2017-01-20 DIAGNOSIS — M5136 Other intervertebral disc degeneration, lumbar region: Secondary | ICD-10-CM | POA: Insufficient documentation

## 2017-01-20 DIAGNOSIS — M51369 Other intervertebral disc degeneration, lumbar region without mention of lumbar back pain or lower extremity pain: Secondary | ICD-10-CM | POA: Insufficient documentation

## 2017-01-20 DIAGNOSIS — I25110 Atherosclerotic heart disease of native coronary artery with unstable angina pectoris: Secondary | ICD-10-CM | POA: Insufficient documentation

## 2017-01-20 DIAGNOSIS — I340 Nonrheumatic mitral (valve) insufficiency: Secondary | ICD-10-CM | POA: Insufficient documentation

## 2017-01-20 MED ORDER — PEG 3350-KCL-NA BICARB-NACL 420 G PO SOLR: 4000.0000 mL | Freq: Once | ORAL | 0 refills | Status: AC

## 2017-01-20 NOTE — Telephone Encounter (Signed)
Gastroenterology Pre-Procedure Review  Request Date: 4/13 Requesting Physician: Dr.  Allen Norris  PATIENT REVIEW QUESTIONS: The patient responded to the following health history questions as indicated:    1. Are you having any GI issues? no 2. Do you have a personal history of Polyps? yes (abnormal polyps) 3. Do you have a family history of Colon Cancer or Polyps? no 4. Diabetes Mellitus? yes (Type II) 5. Joint replacements in the past 12 months?no 6. Major health problems in the past 3 months?no 7. Any artificial heart valves, MVP, or defibrillator?no    MEDICATIONS & ALLERGIES:    Patient reports the following regarding taking any anticoagulation/antiplatelet therapy:   Plavix, Coumadin, Eliquis, Xarelto, Lovenox, Pradaxa, Brilinta, or Effient? no Aspirin? yes (81mg )  Patient confirms/reports the following medications:  Current Outpatient Prescriptions  Medication Sig Dispense Refill  . aspirin 81 MG tablet Take 1 tablet by mouth daily.    Marland Kitchen atorvastatin (LIPITOR) 40 MG tablet Take 1 tablet (40 mg total) by mouth daily at 6 PM. 90 tablet 0  . clonazePAM (KLONOPIN) 1 MG tablet Take 1 tablet (1 mg total) by mouth 3 (three) times daily as needed for anxiety. 90 tablet 0  . cloNIDine (CATAPRES) 0.1 MG tablet Take 1 tablet (0.1 mg total) by mouth 2 (two) times daily. 180 tablet 0  . ergocalciferol (VITAMIN D2) 50000 UNITS capsule Take 1 capsule by mouth once a week. Reported on 04/05/2016    . glucose blood test strip Use as instructed 50 each 12  . Icosapent Ethyl (VASCEPA) 1 G CAPS Take 2 g by mouth 2 (two) times daily with a meal. (Patient not taking: Reported on 01/17/2017) 120 capsule 0  . metFORMIN (GLUCOPHAGE-XR) 500 MG 24 hr tablet Take 1 tablet (500 mg total) by mouth 2 (two) times daily. 180 tablet 0  . nabumetone (RELAFEN) 750 MG tablet     . Omega-3 Fatty Acids (FISH OIL) 1000 MG CAPS Take 2 capsules by mouth daily.    Marland Kitchen omeprazole (PRILOSEC) 40 MG capsule Take 1 capsule (40 mg total)  by mouth daily. 90 capsule 0  . oxyCODONE-acetaminophen (PERCOCET) 10-325 MG tablet Take 1 tablet by mouth every 6 (six) hours as needed. Pt. Takes 5 tabs/ day 155 tablet 0  . telmisartan (MICARDIS) 40 MG tablet Take 1 tablet (40 mg total) by mouth daily. 90 tablet 0  . Vitamin D, Ergocalciferol, (DRISDOL) 50000 units CAPS capsule Take 1 capsule (50,000 Units total) by mouth once a week. For 12 weeks (Patient not taking: Reported on 01/17/2017) 12 capsule 0  . zolpidem (AMBIEN) 10 MG tablet Take 1 tablet (10 mg total) by mouth at bedtime. 30 tablet 2   No current facility-administered medications for this visit.     Patient confirms/reports the following allergies:  Allergies  Allergen Reactions  . Ace Inhibitors Cough  . Diphenhydramine     stomach upset    No orders of the defined types were placed in this encounter.   AUTHORIZATION INFORMATION Primary Insurance: 1D#: Group #:  Secondary Insurance: 1D#: Group #:  SCHEDULE INFORMATION: Date: 4/13 Time: Location: [place]

## 2017-01-21 ENCOUNTER — Telehealth: Payer: Self-pay

## 2017-01-21 ENCOUNTER — Telehealth: Payer: Self-pay | Admitting: Gastroenterology

## 2017-01-21 ENCOUNTER — Ambulatory Visit (INDEPENDENT_AMBULATORY_CARE_PROVIDER_SITE_OTHER): Payer: Medicare Other | Admitting: Family Medicine

## 2017-01-21 ENCOUNTER — Encounter: Payer: Self-pay | Admitting: Family Medicine

## 2017-01-21 DIAGNOSIS — M542 Cervicalgia: Secondary | ICD-10-CM | POA: Diagnosis not present

## 2017-01-21 DIAGNOSIS — F419 Anxiety disorder, unspecified: Secondary | ICD-10-CM

## 2017-01-21 DIAGNOSIS — E119 Type 2 diabetes mellitus without complications: Secondary | ICD-10-CM

## 2017-01-21 DIAGNOSIS — G8929 Other chronic pain: Secondary | ICD-10-CM | POA: Diagnosis not present

## 2017-01-21 MED ORDER — METFORMIN HCL ER 500 MG PO TB24: 500.0000 mg | ORAL_TABLET | Freq: Two times a day (BID) | ORAL | 0 refills | Status: DC

## 2017-01-21 MED ORDER — OXYCODONE-ACETAMINOPHEN 10-325 MG PO TABS: 1.0000 | ORAL_TABLET | Freq: Four times a day (QID) | ORAL | 0 refills | Status: DC | PRN

## 2017-01-21 MED ORDER — CLONAZEPAM 1 MG PO TABS: 1.0000 mg | ORAL_TABLET | Freq: Three times a day (TID) | ORAL | 0 refills | Status: DC | PRN

## 2017-01-21 MED ORDER — VITAMIN D (ERGOCALCIFEROL) 1.25 MG (50000 UNIT) PO CAPS: 50000.0000 [IU] | ORAL_CAPSULE | ORAL | 0 refills | Status: DC

## 2017-01-21 NOTE — Telephone Encounter (Signed)
01/21/17 Spoke with Delana Meyer at [facility] NO prior auth required for colonoscopy [id number]. Ref # [id number]

## 2017-01-21 NOTE — Progress Notes (Signed)
Name: Juan Le   MRN: [id number]    DOB: 30-Apr-1946   Date:01/21/2017       Progress Note  Subjective  Chief Complaint  Chief Complaint  Patient presents with  . Medication Refill    Neck Pain   This is a chronic problem. The problem occurs constantly. The problem has been unchanged. The pain is present in the midline. The quality of the pain is described as aching. The pain is at a severity of 2/10. The symptoms are aggravated by bending and position. Pertinent negatives include no leg pain. He has tried oral narcotics for the symptoms.  Anxiety  Presents for follow-up visit. The problem has been unchanged. Symptoms include insomnia and nervous/anxious behavior. Patient reports no depressed mood, excessive worry, muscle tension or panic. Symptoms occur most days. The severity of symptoms is severe and causing significant distress.   Past treatments include benzodiazephines. The treatment provided significant relief. Compliance with prior treatments has been good.   She'll also has chronic pain in the right thoracic area, has been followed by a spine specialist and received injections in his back which provides some relief.  Past Medical History:  Diagnosis Date  . Anxiety   . Chronic, continuous use of opioids   . Degenerative disc disease, lumbar   . Diabetes ([place])   . DJD (degenerative joint disease)   . GERD (gastroesophageal reflux disease)   . History of diverticulitis   . Hypertension   . Hypertension   . Hypertriglyceridemia   . Hypokalemia   . Insomnia   . Mitral regurgitation   . Vitamin D deficiency     Past Surgical History:  Procedure Laterality Date  . CORONARY ARTERY BYPASS GRAFT  1997  . CORONARY STENT PLACEMENT  11/2006  . FOOT SURGERY  2003  . UMBILICAL HERNIA REPAIR  04/2012    Family History  Problem Relation Age of Onset  . Throat cancer Father   . Colon cancer Father   . Stroke Brother   . Stroke Mother   . Kidney disease Mother   .  Heart disease Mother   . Breast cancer Paternal Aunt     Social History   Social History  . Marital status: Married    Spouse name: N/A  . Number of children: 0  . Years of education: N/A   Occupational History  . Not on file.   Social History Main Topics  . Smoking status: Former Smoker    Types: Cigarettes  . Smokeless tobacco: Never Used     Comment: quit in teens  . Alcohol use No  . Drug use: No  . Sexual activity: Yes    Partners: Female   Other Topics Concern  . Not on file   Social History Narrative  . No narrative on file     Current Outpatient Prescriptions:  .  aspirin 81 MG tablet, Take 1 tablet by mouth daily., Disp: , Rfl:  .  atorvastatin (LIPITOR) 40 MG tablet, Take 1 tablet (40 mg total) by mouth daily at 6 PM., Disp: 90 tablet, Rfl: 0 .  clonazePAM (KLONOPIN) 1 MG tablet, Take 1 tablet (1 mg total) by mouth 3 (three) times daily as needed for anxiety., Disp: 90 tablet, Rfl: 0 .  cloNIDine (CATAPRES) 0.1 MG tablet, Take 1 tablet (0.1 mg total) by mouth 2 (two) times daily., Disp: 180 tablet, Rfl: 0 .  ergocalciferol (VITAMIN D2) 50000 UNITS capsule, Take 1 capsule by mouth once a week.  Reported on 04/05/2016, Disp: , Rfl:  .  glucose blood test strip, Use as instructed, Disp: 50 each, Rfl: 12 .  Icosapent Ethyl (VASCEPA) 1 G CAPS, Take 2 g by mouth 2 (two) times daily with a meal., Disp: 120 capsule, Rfl: 0 .  metFORMIN (GLUCOPHAGE-XR) 500 MG 24 hr tablet, Take 1 tablet (500 mg total) by mouth 2 (two) times daily., Disp: 180 tablet, Rfl: 0 .  nabumetone (RELAFEN) 750 MG tablet, , Disp: , Rfl:  .  Omega-3 Fatty Acids (FISH OIL) 1000 MG CAPS, Take 2 capsules by mouth daily., Disp: , Rfl:  .  omeprazole (PRILOSEC) 40 MG capsule, Take 1 capsule (40 mg total) by mouth daily., Disp: 90 capsule, Rfl: 0 .  oxyCODONE-acetaminophen (PERCOCET) 10-325 MG tablet, Take 1 tablet by mouth every 6 (six) hours as needed. Pt. Takes 5 tabs/ day, Disp: 155 tablet, Rfl: 0 .   telmisartan (MICARDIS) 40 MG tablet, Take 1 tablet (40 mg total) by mouth daily., Disp: 90 tablet, Rfl: 0 .  Vitamin D, Ergocalciferol, (DRISDOL) 50000 units CAPS capsule, Take 1 capsule (50,000 Units total) by mouth once a week. For 12 weeks, Disp: 12 capsule, Rfl: 0 .  zolpidem (AMBIEN) 10 MG tablet, Take 1 tablet (10 mg total) by mouth at bedtime., Disp: 30 tablet, Rfl: 2  Allergies  Allergen Reactions  . Ace Inhibitors Cough  . Diphenhydramine     stomach upset     Review of Systems  Musculoskeletal: Positive for neck pain.  Psychiatric/Behavioral: The patient is nervous/anxious and has insomnia.       Objective  Vitals:   01/21/17 0959  BP: 124/68  Pulse: 61  Resp: 18  Temp: 98.1 F (36.7 C)  TempSrc: Oral  SpO2: 96%  Weight: 224 lb 12.8 oz (102 kg)  Height: 5\' 10"  (1.778 m)    Physical Exam  Constitutional: He is oriented to person, place, and time and well-developed, well-nourished, and in no distress.  HENT:  Head: Normocephalic and atraumatic.  Cardiovascular: Normal rate, regular rhythm, S1 normal, S2 normal and normal heart sounds.   No murmur heard. Pulmonary/Chest: Effort normal and breath sounds normal. He has no wheezes. He has no rhonchi.  Musculoskeletal:       Right ankle: He exhibits no swelling.       Left ankle: He exhibits no swelling.       Cervical back: He exhibits tenderness, pain and spasm.       Thoracic back: He exhibits tenderness, pain and spasm.       Back:  Neurological: He is alert and oriented to person, place, and time.  Psychiatric: Mood, memory, affect and judgment normal.  Nursing note and vitals reviewed.   Assessment & Plan  1. Anxiety Stable, responsive to clonazepam taken 3 times daily when needed, refills provided - clonazePAM (KLONOPIN) 1 MG tablet; Take 1 tablet (1 mg total) by mouth 3 (three) times daily as needed for anxiety.  Dispense: 90 tablet; Refill: 0  2. Chronic neck pain Stable, responsive to Percocet  taken every 6 hours as needed, patient compliant with controlled substances agreement and understands the dependence potential, side effects, and drug interactions of opioids. Refills provided and follow-up in one month - oxyCODONE-acetaminophen (PERCOCET) 10-325 MG tablet; Take 1 tablet by mouth every 6 (six) hours as needed. Pt. Takes 5 tabs/ day  Dispense: 155 tablet; Refill: 0  3. Type 2 diabetes mellitus without complication, without long-term current use of insulin (HCC)  - metFORMIN (  GLUCOPHAGE-XR) 500 MG 24 hr tablet; Take 1 tablet (500 mg total) by mouth 2 (two) times daily.  Dispense: 180 tablet; Refill: 0   Johathon Overturf [facility] 01/21/2017 10:12 AM

## 2017-01-21 NOTE — Telephone Encounter (Signed)
Patient has been notified of lab results and a prescription for vitamin D3 50,000 units take one capsule once a week x12 weeks has been sent to [facility] per Dr. Manuella Ghazi, patient has been notified and verbalized understanding

## 2017-01-24 ENCOUNTER — Encounter: Payer: Self-pay | Admitting: *Deleted

## 2017-01-27 ENCOUNTER — Telehealth: Payer: Self-pay | Admitting: Gastroenterology

## 2017-01-27 NOTE — Telephone Encounter (Signed)
Patient states that he never got his instructions for prepping for tomorrow for a colonoscopy. He did say he did get his prescription. Please call patient.

## 2017-01-27 NOTE — Telephone Encounter (Signed)
Advised pt his instructions were emailed on Monday to [email or web address] Rx was sent to his pharmacy. Pt stated he received his rx but not the email. Email was not returned to me. Instructions discussed in detail with pt. He verbalized understanding of these and will contact me throughout the day with any questions.

## 2017-01-28 ENCOUNTER — Encounter
Admission: RE | Disposition: A | Discharge status: Home / Self Care | Payer: Self-pay | Source: Ambulatory Visit | Attending: Gastroenterology

## 2017-01-28 ENCOUNTER — Ambulatory Visit
Admission: RE | Admit: 2017-01-28 | Discharge: 2017-01-28 | Disposition: A | Discharge status: Home / Self Care | Payer: Medicare Other | Source: Ambulatory Visit | Attending: Gastroenterology | Admitting: Gastroenterology

## 2017-01-28 ENCOUNTER — Ambulatory Visit: Payer: Medicare Other | Admitting: Anesthesiology

## 2017-01-28 DIAGNOSIS — I10 Essential (primary) hypertension: Secondary | ICD-10-CM | POA: Diagnosis not present

## 2017-01-28 DIAGNOSIS — Z1211 Encounter for screening for malignant neoplasm of colon: Secondary | ICD-10-CM | POA: Diagnosis not present

## 2017-01-28 DIAGNOSIS — Z79899 Other long term (current) drug therapy: Secondary | ICD-10-CM | POA: Diagnosis not present

## 2017-01-28 DIAGNOSIS — G4700 Insomnia, unspecified: Secondary | ICD-10-CM | POA: Diagnosis not present

## 2017-01-28 DIAGNOSIS — I2510 Atherosclerotic heart disease of native coronary artery without angina pectoris: Secondary | ICD-10-CM | POA: Diagnosis not present

## 2017-01-28 DIAGNOSIS — Z7982 Long term (current) use of aspirin: Secondary | ICD-10-CM | POA: Diagnosis not present

## 2017-01-28 DIAGNOSIS — Z7984 Long term (current) use of oral hypoglycemic drugs: Secondary | ICD-10-CM | POA: Insufficient documentation

## 2017-01-28 DIAGNOSIS — E119 Type 2 diabetes mellitus without complications: Secondary | ICD-10-CM | POA: Diagnosis not present

## 2017-01-28 DIAGNOSIS — K635 Polyp of colon: Secondary | ICD-10-CM | POA: Diagnosis not present

## 2017-01-28 DIAGNOSIS — I340 Nonrheumatic mitral (valve) insufficiency: Secondary | ICD-10-CM | POA: Diagnosis not present

## 2017-01-28 DIAGNOSIS — D122 Benign neoplasm of ascending colon: Secondary | ICD-10-CM | POA: Diagnosis not present

## 2017-01-28 DIAGNOSIS — Z955 Presence of coronary angioplasty implant and graft: Secondary | ICD-10-CM | POA: Insufficient documentation

## 2017-01-28 DIAGNOSIS — D124 Benign neoplasm of descending colon: Secondary | ICD-10-CM

## 2017-01-28 DIAGNOSIS — E781 Pure hyperglyceridemia: Secondary | ICD-10-CM | POA: Diagnosis not present

## 2017-01-28 DIAGNOSIS — Z860100 Personal history of colon polyps, unspecified: Secondary | ICD-10-CM

## 2017-01-28 DIAGNOSIS — F419 Anxiety disorder, unspecified: Secondary | ICD-10-CM | POA: Insufficient documentation

## 2017-01-28 DIAGNOSIS — K219 Gastro-esophageal reflux disease without esophagitis: Secondary | ICD-10-CM | POA: Diagnosis not present

## 2017-01-28 DIAGNOSIS — Z87891 Personal history of nicotine dependence: Secondary | ICD-10-CM | POA: Insufficient documentation

## 2017-01-28 DIAGNOSIS — Z79891 Long term (current) use of opiate analgesic: Secondary | ICD-10-CM | POA: Insufficient documentation

## 2017-01-28 DIAGNOSIS — K648 Other hemorrhoids: Secondary | ICD-10-CM | POA: Insufficient documentation

## 2017-01-28 DIAGNOSIS — K5730 Diverticulosis of large intestine without perforation or abscess without bleeding: Secondary | ICD-10-CM | POA: Insufficient documentation

## 2017-01-28 HISTORY — DX: Presence of dental prosthetic device (complete) (partial): Z97.2

## 2017-01-28 HISTORY — PX: COLONOSCOPY WITH PROPOFOL: SHX5780

## 2017-01-28 HISTORY — PX: POLYPECTOMY: SHX5525

## 2017-01-28 LAB — GLUCOSE, CAPILLARY
GLUCOSE-CAPILLARY: 118 mg/dL — AB (ref 65–99)
Glucose-Capillary: 131 mg/dL — ABNORMAL HIGH (ref 65–99)

## 2017-01-28 SURGERY — COLONOSCOPY WITH PROPOFOL
Anesthesia: General | Wound class: Contaminated

## 2017-01-28 MED ORDER — LACTATED RINGERS IV SOLN
INTRAVENOUS | Status: DC
  Administered 2017-01-28: 09:00:00 via INTRAVENOUS

## 2017-01-28 MED ORDER — LIDOCAINE HCL (CARDIAC) 20 MG/ML IV SOLN
INTRAVENOUS | Status: DC | PRN
  Administered 2017-01-28: 50 mg via INTRAVENOUS

## 2017-01-28 MED ORDER — STERILE WATER FOR IRRIGATION IR SOLN
Status: DC | PRN
  Administered 2017-01-28: 11:00:00

## 2017-01-28 MED ORDER — PROPOFOL 10 MG/ML IV BOLUS
INTRAVENOUS | Status: DC | PRN
  Administered 2017-01-28: 80 mg via INTRAVENOUS
  Administered 2017-01-28: 50 mg via INTRAVENOUS
  Administered 2017-01-28: 20 mg via INTRAVENOUS
  Administered 2017-01-28: 50 mg via INTRAVENOUS

## 2017-01-28 SURGICAL SUPPLY — 23 items
CANISTER SUCT 1200ML W/VALVE (MISCELLANEOUS) ×3 IMPLANT
CLIP HMST 235XBRD CATH ROT (MISCELLANEOUS) IMPLANT
CLIP RESOLUTION 360 11X235 (MISCELLANEOUS)
FCP ESCP3.2XJMB 240X2.8X (MISCELLANEOUS)
FORCEPS BIOP RAD 4 LRG CAP 4 (CUTTING FORCEPS) ×3 IMPLANT
FORCEPS BIOP RJ4 240 W/NDL (MISCELLANEOUS)
FORCEPS ESCP3.2XJMB 240X2.8X (MISCELLANEOUS) IMPLANT
GOWN CVR UNV OPN BCK APRN NK (MISCELLANEOUS) ×2 IMPLANT
GOWN ISOL THUMB LOOP REG UNIV (MISCELLANEOUS) ×4
INJECTOR VARIJECT VIN23 (MISCELLANEOUS) IMPLANT
KIT DEFENDO VALVE AND CONN (KITS) IMPLANT
KIT ENDO PROCEDURE OLY (KITS) ×3 IMPLANT
MARKER SPOT ENDO TATTOO 5ML (MISCELLANEOUS) IMPLANT
PAD GROUND ADULT SPLIT (MISCELLANEOUS) IMPLANT
PROBE APC STR FIRE (PROBE) IMPLANT
RETRIEVER NET ROTH 2.5X230 LF (MISCELLANEOUS) IMPLANT
SNARE SHORT THROW 13M SML OVAL (MISCELLANEOUS) ×3 IMPLANT
SNARE SHORT THROW 30M LRG OVAL (MISCELLANEOUS) IMPLANT
SNARE SNG USE RND 15MM (INSTRUMENTS) IMPLANT
SPOT EX ENDOSCOPIC TATTOO (MISCELLANEOUS)
TRAP ETRAP POLY (MISCELLANEOUS) ×3 IMPLANT
VARIJECT INJECTOR VIN23 (MISCELLANEOUS)
WATER STERILE IRR 250ML POUR (IV SOLUTION) ×3 IMPLANT

## 2017-01-28 NOTE — Anesthesia Procedure Notes (Signed)
Procedure Name: MAC Date/Time: 01/28/2017 10:46 AM Performed by: Janna Arch Pre-anesthesia Checklist: Patient identified, Emergency Drugs available, Suction available and Patient being monitored Patient Re-evaluated:Patient Re-evaluated prior to inductionOxygen Delivery Method: Nasal cannula

## 2017-01-28 NOTE — Op Note (Signed)
[facility] Patient Name: Juan Le Procedure Date: 01/28/2017 10:00 AM MRN: [id number] Account #: [id number] Date of Birth: December 17, 1945 Admit Type: Outpatient Age: 71 Room: [facility] OR ROOM 01 Gender: Male Note Status: Finalized Procedure:            Colonoscopy Indications:          High risk colon cancer surveillance: Personal history                        of colonic polyps Providers:            [name] MD, MD Referring MD:         Otila Back. Manuella Ghazi (Referring MD) Medicines:            Propofol per Anesthesia Complications:        No immediate complications. Procedure:            Pre-Anesthesia Assessment:                       - Prior to the procedure, a History and Physical was                        performed, and patient medications and allergies were                        reviewed. The patient's tolerance of previous                        anesthesia was also reviewed. The risks and benefits of                        the procedure and the sedation options and risks were                        discussed with the patient. All questions were                        answered, and informed consent was obtained. Prior                        Anticoagulants: The patient has taken no previous                        anticoagulant or antiplatelet agents. ASA Grade                        Assessment: II - A patient with mild systemic disease.                        After reviewing the risks and benefits, the patient was                        deemed in satisfactory condition to undergo the                        procedure.                       After obtaining informed consent, the colonoscope was  passed under direct vision. Throughout the procedure,                        the patient's blood pressure, pulse, and oxygen                        saturations were monitored continuously. The Kirby (S#: [id number]) was introduced through                        the anus and advanced to the the cecum, identified by                        appendiceal orifice and ileocecal valve. The                        colonoscopy was performed without difficulty. The                        patient tolerated the procedure well. The quality of                        the bowel preparation was excellent. Findings:      The perianal and digital rectal examinations were normal.      Two sessile polyps were found in the ascending colon. The polyps were 2       to 3 mm in size. These polyps were removed with a cold biopsy forceps.       Resection and retrieval were complete.      A 6 mm polyp was found in the descending colon. The polyp was sessile.       The polyp was removed with a cold snare. Resection and retrieval were       complete.      Multiple small-mouthed diverticula were found in the sigmoid colon.      Non-bleeding internal hemorrhoids were found during retroflexion. The       hemorrhoids were Grade II (internal hemorrhoids that prolapse but reduce       spontaneously). Impression:           - Two 2 to 3 mm polyps in the ascending colon, removed                        with a cold biopsy forceps. Resected and retrieved.                       - One 6 mm polyp in the descending colon, removed with                        a cold snare. Resected and retrieved.                       - Diverticulosis in the sigmoid colon.                       - Non-bleeding internal hemorrhoids. Recommendation:       - Discharge patient to home.                       -  Resume previous diet.                       - Continue present medications. Procedure Code(s):    --- Professional ---                       [phone], Colonoscopy, flexible; with removal of tumor(s),                        polyp(s), or other lesion(s) by snare technique Diagnosis Code(s):    --- Professional ---                       Z86.010,  Personal history of colonic polyps                       D12.2, Benign neoplasm of ascending colon                       D12.4, Benign neoplasm of descending colon CPT copyright 2016 American Medical Association. All rights reserved. The codes documented in this report are preliminary and upon coder review may  be revised to meet current compliance requirements. [name] MD, MD 01/28/2017 11:06:42 AM This report has been signed electronically. Number of Addenda: 0 Note Initiated On: 01/28/2017 10:00 AM Scope Withdrawal Time: 0 hours 10 minutes 39 seconds  Total Procedure Duration: 0 hours 12 minutes 50 seconds       [facility]

## 2017-01-28 NOTE — Anesthesia Preprocedure Evaluation (Signed)
Anesthesia Evaluation  Patient identified by MRN, date of birth, ID band Patient awake    Reviewed: Allergy & Precautions, NPO status   Airway Mallampati: I  TM Distance: >3 FB    Comment: beard Dental  (+) Upper Dentures   Pulmonary former smoker,    breath sounds clear to auscultation       Cardiovascular hypertension, (-) angina+ CAD, + Cardiac Stents (2008) and + CABG (1997 - 4 vessel)   Rhythm:Regular Rate:Normal     Neuro/Psych Anxiety    GI/Hepatic GERD  ,  Endo/Other  diabetes, Type 2BMI 32  Renal/GU      Musculoskeletal  (+) Arthritis ,   Abdominal   Peds  Hematology   Anesthesia Other Findings   Reproductive/Obstetrics                            Anesthesia Physical Anesthesia Plan  ASA: III  Anesthesia Plan: General   Post-op Pain Management:    Induction: Intravenous  Airway Management Planned: Natural Airway and Nasal Cannula  Additional Equipment:   Intra-op Plan:   Post-operative Plan:   Informed Consent: I have reviewed the patients History and Physical, chart, labs and discussed the procedure including the risks, benefits and alternatives for the proposed anesthesia with the patient or authorized representative who has indicated his/her understanding and acceptance.     Plan Discussed with: CRNA  Anesthesia Plan Comments:         Anesthesia Quick Evaluation

## 2017-01-28 NOTE — Discharge Instructions (Signed)

## 2017-01-28 NOTE — H&P (Signed)
[name], MD [facility] [address] [place], [place] [address] Phone:[phone] Fax : [phone]  Primary Care Physician:  Keith Rake, MD Primary Gastroenterologist:  Dr. Allen Norris  Pre-Procedure History & Physical: HPI:  Juan Le. is a 71 y.o. male is here for an colonoscopy.   Past Medical History:  Diagnosis Date  . Anxiety   . Chronic, continuous use of opioids   . Degenerative disc disease, lumbar   . Diabetes ([place])   . DJD (degenerative joint disease)   . GERD (gastroesophageal reflux disease)   . History of diverticulitis   . Hypertension   . Hypertension   . Hypertriglyceridemia   . Hypokalemia   . Insomnia   . Mitral regurgitation   . Vitamin D deficiency   . Wears dentures    full upper    Past Surgical History:  Procedure Laterality Date  . CORONARY ARTERY BYPASS GRAFT  1997  . CORONARY STENT PLACEMENT  11/2006  . FOOT SURGERY  2003  . UMBILICAL HERNIA REPAIR  04/2012    Prior to Admission medications   Medication Sig Start Date End Date Taking? Authorizing Provider  aspirin 81 MG tablet Take 1 tablet by mouth daily.   Yes Historical Provider, MD  atorvastatin (LIPITOR) 40 MG tablet Take 1 tablet (40 mg total) by mouth daily at 6 PM. 12/23/16  Yes Roselee Nova, MD  Cholecalciferol (VITAMIN D-1000 MAX ST PO) Take by mouth daily.   Yes Historical Provider, MD  clonazePAM (KLONOPIN) 1 MG tablet Take 1 tablet (1 mg total) by mouth 3 (three) times daily as needed for anxiety. 01/21/17  Yes Roselee Nova, MD  cloNIDine (CATAPRES) 0.1 MG tablet Take 1 tablet (0.1 mg total) by mouth 2 (two) times daily. 11/24/16  Yes Roselee Nova, MD  glucose blood test strip Use as instructed 04/16/15  Yes Roselee Nova, MD  loratadine (CLARITIN) 10 MG tablet Take 10 mg by mouth 2 (two) times daily.   Yes Historical Provider, MD  metFORMIN (GLUCOPHAGE-XR) 500 MG 24 hr tablet Take 1 tablet (500 mg total) by mouth 2 (two) times daily. 01/21/17  Yes Roselee Nova, MD  Omega-3 Fatty Acids (FISH OIL) 1000 MG CAPS Take 2 capsules by mouth daily.   Yes Historical Provider, MD  omeprazole (PRILOSEC) 40 MG capsule Take 1 capsule (40 mg total) by mouth daily. 12/09/16  Yes Roselee Nova, MD  oxyCODONE-acetaminophen (PERCOCET) 10-325 MG tablet Take 1 tablet by mouth every 6 (six) hours as needed. Pt. Takes 5 tabs/ day 01/21/17  Yes Roselee Nova, MD  telmisartan (MICARDIS) 40 MG tablet Take 1 tablet (40 mg total) by mouth daily. 12/23/16  Yes Roselee Nova, MD  zolpidem (AMBIEN) 10 MG tablet Take 1 tablet (10 mg total) by mouth at bedtime. 11/24/16  Yes Roselee Nova, MD  Vitamin D, Ergocalciferol, (DRISDOL) 50000 units CAPS capsule Take 1 capsule (50,000 Units total) by mouth once a week. For 12 weeks Patient not taking: Reported on 01/24/2017 01/21/17   Roselee Nova, MD    Allergies as of 01/20/2017 - Review Complete 01/17/2017  Allergen Reaction Noted  . Ace inhibitors Cough 04/11/2015  . Diphenhydramine  04/11/2015    Family History  Problem Relation Age of Onset  . Throat cancer Father   . Colon cancer Father   . Stroke Brother   . Stroke Mother   . Kidney  disease Mother   . Heart disease Mother   . Breast cancer Paternal Aunt     Social History   Social History  . Marital status: Married    Spouse name: N/A  . Number of children: 0  . Years of education: N/A   Occupational History  . Not on file.   Social History Main Topics  . Smoking status: Former Smoker    Types: Cigarettes  . Smokeless tobacco: Never Used     Comment: quit in teens  . Alcohol use No  . Drug use: No  . Sexual activity: Yes    Partners: Female   Other Topics Concern  . Not on file   Social History Narrative  . No narrative on file    Review of Systems: See HPI, otherwise negative ROS  Physical Exam: BP (!) 144/74   Pulse 62   Temp 97.7 F (36.5 C) (Temporal)   Resp 16   Ht 5\' 10"  (1.778 m)   Wt 227 lb (103 kg)   SpO2 100%   BMI  32.57 kg/m  General:   Alert,  pleasant and cooperative in NAD Head:  Normocephalic and atraumatic. Neck:  Supple; no masses or thyromegaly. Lungs:  Clear throughout to auscultation.    Heart:  Regular rate and rhythm. Abdomen:  Soft, nontender and nondistended. Normal bowel sounds, without guarding, and without rebound.   Neurologic:  Alert and  oriented x4;  grossly normal neurologically.  Impression/Plan: Juan Le. is here for an colonoscopy to be performed for history of polyps  Risks, benefits, limitations, and alternatives regarding  colonoscopy have been reviewed with the patient.  Questions have been answered.  All parties agreeable.   [name], MD  01/28/2017, 9:24 AM

## 2017-01-28 NOTE — Transfer of Care (Signed)
Immediate Anesthesia Transfer of Care Note  Patient: Juan Le.  Procedure(s) Performed: Procedure(s) with comments: COLONOSCOPY WITH PROPOFOL (N/A) - diabetic - oral meds POLYPECTOMY (N/A)  Patient Location: PACU  Anesthesia Type: General  Level of Consciousness: awake, alert  and patient cooperative  Airway and Oxygen Therapy: Patient Spontanous Breathing and Patient connected to supplemental oxygen  Post-op Assessment: Post-op Vital signs reviewed, Patient's Cardiovascular Status Stable, Respiratory Function Stable, Patent Airway and No signs of Nausea or vomiting  Post-op Vital Signs: Reviewed and stable  Complications: No apparent anesthesia complications

## 2017-01-28 NOTE — Anesthesia Postprocedure Evaluation (Signed)
Anesthesia Post Note  Patient: Juan Le.  Procedure(s) Performed: Procedure(s) (LRB): COLONOSCOPY WITH PROPOFOL (N/A) POLYPECTOMY (N/A)  Patient location during evaluation: PACU Anesthesia Type: General Level of consciousness: awake and alert Pain management: pain level controlled Vital Signs Assessment: post-procedure vital signs reviewed and stable Respiratory status: spontaneous breathing, nonlabored ventilation and respiratory function stable Cardiovascular status: stable Postop Assessment: no signs of nausea or vomiting Anesthetic complications: no    Veda Canning

## 2017-01-31 ENCOUNTER — Encounter: Payer: Self-pay | Admitting: Gastroenterology

## 2017-02-01 ENCOUNTER — Encounter: Payer: Self-pay | Admitting: Gastroenterology

## 2017-02-02 ENCOUNTER — Other Ambulatory Visit: Payer: Self-pay

## 2017-02-07 ENCOUNTER — Other Ambulatory Visit: Payer: Self-pay

## 2017-02-18 ENCOUNTER — Ambulatory Visit (INDEPENDENT_AMBULATORY_CARE_PROVIDER_SITE_OTHER): Payer: Medicare Other | Admitting: Family Medicine

## 2017-02-18 ENCOUNTER — Encounter: Payer: Self-pay | Admitting: Family Medicine

## 2017-02-18 DIAGNOSIS — M542 Cervicalgia: Secondary | ICD-10-CM | POA: Diagnosis not present

## 2017-02-18 DIAGNOSIS — G4700 Insomnia, unspecified: Secondary | ICD-10-CM | POA: Diagnosis not present

## 2017-02-18 DIAGNOSIS — K219 Gastro-esophageal reflux disease without esophagitis: Secondary | ICD-10-CM | POA: Diagnosis not present

## 2017-02-18 DIAGNOSIS — I10 Essential (primary) hypertension: Secondary | ICD-10-CM | POA: Diagnosis not present

## 2017-02-18 DIAGNOSIS — G8929 Other chronic pain: Secondary | ICD-10-CM

## 2017-02-18 MED ORDER — OMEPRAZOLE 40 MG PO CPDR: 40.0000 mg | DELAYED_RELEASE_CAPSULE | Freq: Every day | ORAL | 0 refills | Status: DC

## 2017-02-18 MED ORDER — OXYCODONE-ACETAMINOPHEN 10-325 MG PO TABS: 1.0000 | ORAL_TABLET | Freq: Four times a day (QID) | ORAL | 0 refills | Status: DC | PRN

## 2017-02-18 MED ORDER — ZOLPIDEM TARTRATE 10 MG PO TABS: 10.0000 mg | ORAL_TABLET | Freq: Every day | ORAL | 2 refills | Status: DC

## 2017-02-18 MED ORDER — CLONIDINE HCL 0.1 MG PO TABS: 0.1000 mg | ORAL_TABLET | Freq: Two times a day (BID) | ORAL | 0 refills | Status: DC

## 2017-02-18 NOTE — Progress Notes (Signed)
Name: Juan Le.   MRN: [id number]    DOB: 05/11/1946   Date:02/18/2017       Progress Note  Subjective  Chief Complaint  Chief Complaint  Patient presents with  . Follow-up    1 mo  . Medication Refill    Neck Pain   This is a chronic problem. The problem occurs constantly. The problem has been unchanged. The pain is present in the midline. The pain is at a severity of 2/10. The symptoms are aggravated by bending and position (Pain usually worse in the morning when he wakes up and twists his neck, ). Pertinent negatives include no chest pain, headaches or leg pain. He has tried oral narcotics for the symptoms.  Hypertension  This is a chronic problem. The problem is unchanged. The problem is controlled. Associated symptoms include neck pain (chronic neck pain). Pertinent negatives include no blurred vision, chest pain, headaches, orthopnea or palpitations. Past treatments include central alpha agonists and angiotensin blockers. Hypertensive end-organ damage includes CAD/MI. There is no history of kidney disease or CVA.  Insomnia  Primary symptoms: difficulty falling asleep, frequent awakening.  The onset quality is gradual. Typical bedtime:  10-11 P.M..  How long after going to bed to you fall asleep: 15-30 minutes.   PMH includes: chronic pain.     Past Medical History:  Diagnosis Date  . Anxiety   . Chronic, continuous use of opioids   . Degenerative disc disease, lumbar   . Diabetes ([place])   . DJD (degenerative joint disease)   . GERD (gastroesophageal reflux disease)   . History of diverticulitis   . Hypertension   . Hypertension   . Hypertriglyceridemia   . Hypokalemia   . Insomnia   . Mitral regurgitation   . Vitamin D deficiency   . Wears dentures    full upper    Past Surgical History:  Procedure Laterality Date  . COLONOSCOPY WITH PROPOFOL N/A 01/28/2017   Procedure: COLONOSCOPY WITH PROPOFOL;  Surgeon: [name], MD;  Location: [facility];   Service: Endoscopy;  Laterality: N/A;  diabetic - oral meds  . CORONARY ARTERY BYPASS GRAFT  1997  . CORONARY STENT PLACEMENT  11/2006  . FOOT SURGERY  2003  . POLYPECTOMY N/A 01/28/2017   Procedure: POLYPECTOMY;  Surgeon: [name], MD;  Location: [facility];  Service: Endoscopy;  Laterality: N/A;  . UMBILICAL HERNIA REPAIR  04/2012    Family History  Problem Relation Age of Onset  . Throat cancer Father   . Colon cancer Father   . Stroke Brother   . Stroke Mother   . Kidney disease Mother   . Heart disease Mother   . Breast cancer Paternal Aunt     Social History   Social History  . Marital status: Married    Spouse name: N/A  . Number of children: 0  . Years of education: N/A   Occupational History  . Not on file.   Social History Main Topics  . Smoking status: Former Smoker    Types: Cigarettes  . Smokeless tobacco: Never Used     Comment: quit in teens  . Alcohol use No  . Drug use: No  . Sexual activity: Yes    Partners: Female   Other Topics Concern  . Not on file   Social History Narrative  . No narrative on file     Current Outpatient Prescriptions:  .  aspirin 81 MG tablet, Take 1 tablet by  mouth daily., Disp: , Rfl:  .  atorvastatin (LIPITOR) 40 MG tablet, Take 1 tablet (40 mg total) by mouth daily at 6 PM., Disp: 90 tablet, Rfl: 0 .  Cholecalciferol (VITAMIN D-1000 MAX ST PO), Take by mouth daily., Disp: , Rfl:  .  clonazePAM (KLONOPIN) 1 MG tablet, Take 1 tablet (1 mg total) by mouth 3 (three) times daily as needed for anxiety., Disp: 90 tablet, Rfl: 0 .  cloNIDine (CATAPRES) 0.1 MG tablet, Take 1 tablet (0.1 mg total) by mouth 2 (two) times daily., Disp: 180 tablet, Rfl: 0 .  glucose blood test strip, Use as instructed, Disp: 50 each, Rfl: 12 .  loratadine (CLARITIN) 10 MG tablet, Take 10 mg by mouth 2 (two) times daily., Disp: , Rfl:  .  metFORMIN (GLUCOPHAGE-XR) 500 MG 24 hr tablet, Take 1 tablet (500 mg total) by mouth 2 (two) times  daily., Disp: 180 tablet, Rfl: 0 .  Omega-3 Fatty Acids (FISH OIL) 1000 MG CAPS, Take 2 capsules by mouth daily., Disp: , Rfl:  .  omeprazole (PRILOSEC) 40 MG capsule, Take 1 capsule (40 mg total) by mouth daily., Disp: 90 capsule, Rfl: 0 .  oxyCODONE-acetaminophen (PERCOCET) 10-325 MG tablet, Take 1 tablet by mouth every 6 (six) hours as needed. Pt. Takes 5 tabs/ day, Disp: 155 tablet, Rfl: 0 .  telmisartan (MICARDIS) 40 MG tablet, Take 1 tablet (40 mg total) by mouth daily., Disp: 90 tablet, Rfl: 0 .  Vitamin D, Ergocalciferol, (DRISDOL) 50000 units CAPS capsule, Take 1 capsule (50,000 Units total) by mouth once a week. For 12 weeks, Disp: 12 capsule, Rfl: 0 .  zolpidem (AMBIEN) 10 MG tablet, Take 1 tablet (10 mg total) by mouth at bedtime., Disp: 30 tablet, Rfl: 2  Allergies  Allergen Reactions  . Ace Inhibitors Cough  . Diphenhydramine     stomach upset     Review of Systems  Eyes: Negative for blurred vision.  Cardiovascular: Negative for chest pain, palpitations and orthopnea.  Musculoskeletal: Positive for neck pain (chronic neck pain).  Neurological: Negative for headaches.  Psychiatric/Behavioral: The patient has insomnia.      Objective  Vitals:   02/18/17 0944  BP: 122/73  Pulse: 62  Resp: 16  Temp: 98.2 F (36.8 C)  TempSrc: Oral  SpO2: 96%  Weight: 227 lb 14.4 oz (103.4 kg)  Height: 5\' 10"  (1.778 m)    Physical Exam  Constitutional: He is oriented to person, place, and time and well-developed, well-nourished, and in no distress.  HENT:  Head: Normocephalic and atraumatic.  Cardiovascular: Normal rate, regular rhythm, S1 normal, S2 normal and normal heart sounds.   No murmur heard. Pulmonary/Chest: Effort normal and breath sounds normal. He has no wheezes. He has no rhonchi.  Musculoskeletal:       Right ankle: He exhibits no swelling.       Left ankle: He exhibits no swelling.       Cervical back: He exhibits tenderness, pain and spasm.       Thoracic  back: He exhibits tenderness, pain and spasm.       Back:  Neurological: He is alert and oriented to person, place, and time.  Psychiatric: Mood, memory, affect and judgment normal.  Nursing note and vitals reviewed.       Assessment & Plan  1. Chronic neck pain Stable and responsive to opioid therapy, also helps with upper back pain, has been receiving shots in the lower back by a spine specialist. Continue on Percocet,  refills provided - oxyCODONE-acetaminophen (PERCOCET) 10-325 MG tablet; Take 1 tablet by mouth every 6 (six) hours as needed. Pt. Takes 5 tabs/ day  Dispense: 155 tablet; Refill: 0  2. Essential hypertension BP stable on present antihypertensive therapy - cloNIDine (CATAPRES) 0.1 MG tablet; Take 1 tablet (0.1 mg total) by mouth 2 (two) times daily.  Dispense: 180 tablet; Refill: 0  3. Gastroesophageal reflux disease, esophagitis presence not specified  - omeprazole (PRILOSEC) 40 MG capsule; Take 1 capsule (40 mg total) by mouth daily.  Dispense: 90 capsule; Refill: 0  4. Insomnia, unspecified type  - zolpidem (AMBIEN) 10 MG tablet; Take 1 tablet (10 mg total) by mouth at bedtime.  Dispense: 30 tablet; Refill: 2   Zanaria Morell [facility] 02/18/2017 10:14 AM

## 2017-03-22 ENCOUNTER — Ambulatory Visit (INDEPENDENT_AMBULATORY_CARE_PROVIDER_SITE_OTHER): Payer: Medicare Other | Admitting: Family Medicine

## 2017-03-22 ENCOUNTER — Encounter: Payer: Self-pay | Admitting: Family Medicine

## 2017-03-22 VITALS — BP 122/63 | HR 77 | Temp 98.3°F | Resp 16 | Ht 70.0 in | Wt 218.6 lb

## 2017-03-22 DIAGNOSIS — I10 Essential (primary) hypertension: Secondary | ICD-10-CM | POA: Diagnosis not present

## 2017-03-22 DIAGNOSIS — F419 Anxiety disorder, unspecified: Secondary | ICD-10-CM

## 2017-03-22 DIAGNOSIS — H1132 Conjunctival hemorrhage, left eye: Secondary | ICD-10-CM | POA: Diagnosis not present

## 2017-03-22 DIAGNOSIS — E119 Type 2 diabetes mellitus without complications: Secondary | ICD-10-CM | POA: Diagnosis not present

## 2017-03-22 DIAGNOSIS — M542 Cervicalgia: Secondary | ICD-10-CM | POA: Diagnosis not present

## 2017-03-22 DIAGNOSIS — G8929 Other chronic pain: Secondary | ICD-10-CM | POA: Diagnosis not present

## 2017-03-22 LAB — POCT GLYCOSYLATED HEMOGLOBIN (HGB A1C): Hemoglobin A1C: 6.4

## 2017-03-22 LAB — GLUCOSE, POCT (MANUAL RESULT ENTRY): POC Glucose: 125 mg/dl — AB (ref 70–99)

## 2017-03-22 MED ORDER — CLONAZEPAM 1 MG PO TABS: 1.0000 mg | ORAL_TABLET | Freq: Three times a day (TID) | ORAL | 0 refills | Status: DC | PRN

## 2017-03-22 MED ORDER — OXYCODONE-ACETAMINOPHEN 10-325 MG PO TABS: 1.0000 | ORAL_TABLET | Freq: Four times a day (QID) | ORAL | 0 refills | Status: DC | PRN

## 2017-03-22 MED ORDER — TELMISARTAN 40 MG PO TABS: 40.0000 mg | ORAL_TABLET | Freq: Every day | ORAL | 0 refills | Status: DC

## 2017-03-22 NOTE — Progress Notes (Signed)
Name: Juan Le.   MRN: [id number]    DOB: 1946-01-13   Date:03/22/2017       Progress Note  Subjective  Chief Complaint  Chief Complaint  Patient presents with  . Follow-up    1 mo  . Medication Refill    Neck Pain   This is a chronic problem. The problem occurs constantly. The problem has been unchanged. The pain is present in the midline. The pain is at a severity of 2/10. The symptoms are aggravated by bending and position. Pertinent negatives include no chest pain, headaches or leg pain (Right leg pain and numbness,). He has tried oral narcotics for the symptoms.  Hypertension  This is a chronic problem. The problem is unchanged. The problem is controlled. Associated symptoms include anxiety. Pertinent negatives include no blurred vision, chest pain, headaches or palpitations. Past treatments include central alpha agonists and angiotensin blockers. Hypertensive end-organ damage includes CAD/MI. There is no history of kidney disease or CVA.  Diabetes  He presents for his follow-up diabetic visit. He has type 2 diabetes mellitus. His disease course has been improving. Hypoglycemia symptoms include nervousness/anxiousness. Pertinent negatives for hypoglycemia include no headaches. Pertinent negatives for diabetes include no blurred vision, no chest pain, no foot paresthesias, no polydipsia and no polyuria. Pertinent negatives for diabetic complications include no CVA. Risk factors for coronary artery disease include dyslipidemia. Current diabetic treatment includes oral agent (monotherapy). He is following a generally healthy diet. He monitors blood glucose at home 1-2 x per week. His breakfast blood glucose range is generally 130-140 mg/dl. An ACE inhibitor/angiotensin II receptor blocker is being taken. Eye exam is current.  Anxiety  Presents for follow-up visit. Symptoms include excessive worry, insomnia and nervous/anxious behavior. Patient reports no chest pain, palpitations or  panic. The severity of symptoms is moderate and causing significant distress.      Past Medical History:  Diagnosis Date  . Anxiety   . Chronic, continuous use of opioids   . Degenerative disc disease, lumbar   . Diabetes ([place])   . DJD (degenerative joint disease)   . GERD (gastroesophageal reflux disease)   . History of diverticulitis   . Hypertension   . Hypertension   . Hypertriglyceridemia   . Hypokalemia   . Insomnia   . Mitral regurgitation   . Vitamin D deficiency   . Wears dentures    full upper    Past Surgical History:  Procedure Laterality Date  . COLONOSCOPY WITH PROPOFOL N/A 01/28/2017   Procedure: COLONOSCOPY WITH PROPOFOL;  Surgeon: [name], MD;  Location: [facility];  Service: Endoscopy;  Laterality: N/A;  diabetic - oral meds  . CORONARY ARTERY BYPASS GRAFT  1997  . CORONARY STENT PLACEMENT  11/2006  . FOOT SURGERY  2003  . POLYPECTOMY N/A 01/28/2017   Procedure: POLYPECTOMY;  Surgeon: [name], MD;  Location: [facility];  Service: Endoscopy;  Laterality: N/A;  . UMBILICAL HERNIA REPAIR  04/2012    Family History  Problem Relation Age of Onset  . Throat cancer Father   . Colon cancer Father   . Stroke Brother   . Stroke Mother   . Kidney disease Mother   . Heart disease Mother   . Breast cancer Paternal Aunt     Social History   Social History  . Marital status: Married    Spouse name: N/A  . Number of children: 0  . Years of education: N/A   Occupational History  .  Not on file.   Social History Main Topics  . Smoking status: Former Smoker    Types: Cigarettes  . Smokeless tobacco: Never Used     Comment: quit in teens  . Alcohol use No  . Drug use: No  . Sexual activity: Yes    Partners: Female   Other Topics Concern  . Not on file   Social History Narrative  . No narrative on file     Current Outpatient Prescriptions:  .  aspirin 81 MG tablet, Take 1 tablet by mouth daily., Disp: , Rfl:  .   atorvastatin (LIPITOR) 40 MG tablet, Take 1 tablet (40 mg total) by mouth daily at 6 PM., Disp: 90 tablet, Rfl: 0 .  Cholecalciferol (VITAMIN D-1000 MAX ST PO), Take by mouth daily., Disp: , Rfl:  .  clonazePAM (KLONOPIN) 1 MG tablet, Take 1 tablet (1 mg total) by mouth 3 (three) times daily as needed for anxiety., Disp: 90 tablet, Rfl: 0 .  cloNIDine (CATAPRES) 0.1 MG tablet, Take 1 tablet (0.1 mg total) by mouth 2 (two) times daily., Disp: 180 tablet, Rfl: 0 .  glucose blood test strip, Use as instructed, Disp: 50 each, Rfl: 12 .  loratadine (CLARITIN) 10 MG tablet, Take 10 mg by mouth 2 (two) times daily., Disp: , Rfl:  .  metFORMIN (GLUCOPHAGE-XR) 500 MG 24 hr tablet, Take 1 tablet (500 mg total) by mouth 2 (two) times daily., Disp: 180 tablet, Rfl: 0 .  Omega-3 Fatty Acids (FISH OIL) 1000 MG CAPS, Take 2 capsules by mouth daily., Disp: , Rfl:  .  omeprazole (PRILOSEC) 40 MG capsule, Take 1 capsule (40 mg total) by mouth daily., Disp: 90 capsule, Rfl: 0 .  oxyCODONE-acetaminophen (PERCOCET) 10-325 MG tablet, Take 1 tablet by mouth every 6 (six) hours as needed. Pt. Takes 5 tabs/ day, Disp: 155 tablet, Rfl: 0 .  telmisartan (MICARDIS) 40 MG tablet, Take 1 tablet (40 mg total) by mouth daily., Disp: 90 tablet, Rfl: 0 .  Vitamin D, Ergocalciferol, (DRISDOL) 50000 units CAPS capsule, Take 1 capsule (50,000 Units total) by mouth once a week. For 12 weeks, Disp: 12 capsule, Rfl: 0 .  zolpidem (AMBIEN) 10 MG tablet, Take 1 tablet (10 mg total) by mouth at bedtime., Disp: 30 tablet, Rfl: 2  Allergies  Allergen Reactions  . Ace Inhibitors Cough  . Diphenhydramine     stomach upset     Review of Systems  Eyes: Negative for blurred vision.  Cardiovascular: Negative for chest pain and palpitations.  Neurological: Negative for headaches.  Endo/Heme/Allergies: Negative for polydipsia.  Psychiatric/Behavioral: The patient is nervous/anxious and has insomnia.       Objective  Vitals:    03/22/17 0959  BP: 122/63  Pulse: 77  Resp: 16  Temp: 98.3 F (36.8 C)  TempSrc: Oral  SpO2: 96%  Weight: 218 lb 9.6 oz (99.2 kg)  Height: 5\' 10"  (1.778 m)    Physical Exam  Constitutional: He is oriented to person, place, and time and well-developed, well-nourished, and in no distress.  HENT:  Head: Normocephalic and atraumatic.  Eyes: Left conjunctiva has a hemorrhage.    Cardiovascular: Normal rate, regular rhythm, S1 normal, S2 normal and normal heart sounds.   No murmur heard. Pulmonary/Chest: Effort normal and breath sounds normal. He has no wheezes. He has no rhonchi.  Musculoskeletal:       Right ankle: He exhibits no swelling.       Left ankle: He exhibits no swelling.  Cervical back: He exhibits tenderness, pain and spasm.       Thoracic back: He exhibits tenderness, pain and spasm.       Back:  Neurological: He is alert and oriented to person, place, and time.  Psychiatric: Mood, memory, affect and judgment normal.  Nursing note and vitals reviewed.    Assessment & Plan  1. Type 2 diabetes mellitus without complication, without long-term current use of insulin (HCC)   A1c 6.4%, well-controlled diabetes - POCT HgB A1C - POCT Glucose (CBG)  2. Anxiety Stable and responsive to clonazepam taken up to 3 times daily when needed - clonazePAM (KLONOPIN) 1 MG tablet; Take 1 tablet (1 mg total) by mouth 3 (three) times daily as needed for anxiety.  Dispense: 90 tablet; Refill: 0  3. Chronic neck pain Oxycodone helps with pain in neck and lower back, compliant with controlled substances agreement. Refills provided - oxyCODONE-acetaminophen (PERCOCET) 10-325 MG tablet; Take 1 tablet by mouth every 6 (six) hours as needed. Pt. Takes 5 tabs/ day  Dispense: 155 tablet; Refill: 0  4. Essential hypertension BP stable on present antihypertensive therapy - telmisartan (MICARDIS) 40 MG tablet; Take 1 tablet (40 mg total) by mouth daily.  Dispense: 90 tablet; Refill:  0  5. Conjunctival hemorrhage of left eye Reassured that's conjunctival hemorrhage should resolve, advised to follow-up with ophthalmology.   Tatiyanna Lashley [facility] 03/22/2017 10:48 AM

## 2017-03-24 ENCOUNTER — Encounter: Payer: Self-pay | Admitting: Family Medicine

## 2017-03-24 ENCOUNTER — Ambulatory Visit (INDEPENDENT_AMBULATORY_CARE_PROVIDER_SITE_OTHER): Payer: Medicare Other | Admitting: Family Medicine

## 2017-03-24 ENCOUNTER — Ambulatory Visit
Admission: RE | Admit: 2017-03-24 | Discharge: 2017-03-24 | Disposition: A | Discharge status: Home / Self Care | Payer: Medicare Other | Source: Ambulatory Visit | Attending: Family Medicine | Admitting: Family Medicine

## 2017-03-24 VITALS — BP 122/71 | HR 67 | Temp 97.9°F | Resp 16 | Ht 70.0 in | Wt 225.8 lb

## 2017-03-24 DIAGNOSIS — F99 Mental disorder, not otherwise specified: Secondary | ICD-10-CM | POA: Diagnosis not present

## 2017-03-24 DIAGNOSIS — R0609 Other forms of dyspnea: Secondary | ICD-10-CM | POA: Diagnosis not present

## 2017-03-24 DIAGNOSIS — J9811 Atelectasis: Secondary | ICD-10-CM | POA: Insufficient documentation

## 2017-03-24 DIAGNOSIS — Z951 Presence of aortocoronary bypass graft: Secondary | ICD-10-CM | POA: Diagnosis not present

## 2017-03-24 DIAGNOSIS — F5105 Insomnia due to other mental disorder: Secondary | ICD-10-CM

## 2017-03-24 DIAGNOSIS — R0602 Shortness of breath: Secondary | ICD-10-CM | POA: Diagnosis not present

## 2017-03-24 LAB — CBC WITH DIFFERENTIAL/PLATELET
BASOS ABS: 0 {cells}/uL (ref 0–200)
Basophils Relative: 0 %
EOS PCT: 2 %
Eosinophils Absolute: 142 cells/uL (ref 15–500)
HEMATOCRIT: 42.3 % (ref 38.5–50.0)
HEMOGLOBIN: 14.5 g/dL (ref 13.2–17.1)
LYMPHS ABS: 3053 {cells}/uL (ref 850–3900)
Lymphocytes Relative: 43 %
MCH: 32.2 pg (ref 27.0–33.0)
MCHC: 34.3 g/dL (ref 32.0–36.0)
MCV: 94 fL (ref 80.0–100.0)
MONO ABS: 710 {cells}/uL (ref 200–950)
MPV: 9.2 fL (ref 7.5–12.5)
Monocytes Relative: 10 %
NEUTROS ABS: 3195 {cells}/uL (ref 1500–7800)
Neutrophils Relative %: 45 %
Platelets: 159 10*3/uL (ref 140–400)
RBC: 4.5 MIL/uL (ref 4.20–5.80)
RDW: 13.8 % (ref 11.0–15.0)
WBC: 7.1 10*3/uL (ref 3.8–10.8)

## 2017-03-24 LAB — COMPLETE METABOLIC PANEL WITH GFR
ALK PHOS: 47 U/L (ref 40–115)
ALT: 54 U/L — ABNORMAL HIGH (ref 9–46)
AST: 28 U/L (ref 10–35)
Albumin: 4.3 g/dL (ref 3.6–5.1)
BUN: 16 mg/dL (ref 7–25)
CALCIUM: 9.4 mg/dL (ref 8.6–10.3)
CO2: 24 mmol/L (ref 20–31)
Chloride: 101 mmol/L (ref 98–110)
Creat: 0.89 mg/dL (ref 0.70–1.18)
GFR, EST NON AFRICAN AMERICAN: 86 mL/min (ref >=60)
Glucose, Bld: 107 mg/dL — ABNORMAL HIGH (ref 65–99)
POTASSIUM: 4.2 mmol/L (ref 3.5–5.3)
Sodium: 137 mmol/L (ref 135–146)
Total Bilirubin: 0.5 mg/dL (ref 0.2–1.2)
Total Protein: 7.2 g/dL (ref 6.1–8.1)

## 2017-03-24 LAB — BRAIN NATRIURETIC PEPTIDE: BRAIN NATRIURETIC PEPTIDE: 36.4 pg/mL (ref <100)

## 2017-03-24 MED ORDER — TRAZODONE HCL 50 MG PO TABS: 25.0000 mg | ORAL_TABLET | Freq: Every evening | ORAL | 2 refills | Status: DC | PRN

## 2017-03-24 NOTE — Progress Notes (Signed)
Name: Juan Le.   MRN: [id number]    DOB: 12/10/1945   Date:03/24/2017       Progress Note  Subjective  Chief Complaint  Chief Complaint  Patient presents with  . Shortness of Breath    Shortness of Breath  This is a new problem. The current episode started more than 1 month ago (at least 1-2 months). The problem has been unchanged. Pertinent negatives include no chest pain, fever, leg swelling, orthopnea, PND, sore throat or sputum production. The symptoms are aggravated by exercise (mostly with activity such as chores around the house, bending over such as in taking out the trash. He does not get short of breath with walking.). His past medical history is significant for CAD. There is no history of chronic lung disease, PE or pneumonia.  Insomnia  Primary symptoms: difficulty falling asleep, frequent awakening, no malaise/fatigue.  The current episode started more than one month (about 4-5 months ago). The onset quality is gradual. The problem occurs nightly. The problem has been gradually worsening since onset. The symptoms are aggravated by anxiety and pain. Typical bedtime:  10-11 P.M..  How long after going to bed to you fall asleep: over an hour.   PMH includes: hypertension, chronic pain.     Past Medical History:  Diagnosis Date  . Anxiety   . Chronic, continuous use of opioids   . Degenerative disc disease, lumbar   . Diabetes ([place])   . DJD (degenerative joint disease)   . GERD (gastroesophageal reflux disease)   . History of diverticulitis   . Hypertension   . Hypertension   . Hypertriglyceridemia   . Hypokalemia   . Insomnia   . Mitral regurgitation   . Vitamin D deficiency   . Wears dentures    full upper    Past Surgical History:  Procedure Laterality Date  . COLONOSCOPY WITH PROPOFOL N/A 01/28/2017   Procedure: COLONOSCOPY WITH PROPOFOL;  Surgeon: [name], MD;  Location: [facility];  Service: Endoscopy;  Laterality: N/A;  diabetic - oral  meds  . CORONARY ARTERY BYPASS GRAFT  1997  . CORONARY STENT PLACEMENT  11/2006  . FOOT SURGERY  2003  . POLYPECTOMY N/A 01/28/2017   Procedure: POLYPECTOMY;  Surgeon: [name], MD;  Location: [facility];  Service: Endoscopy;  Laterality: N/A;  . UMBILICAL HERNIA REPAIR  04/2012    Family History  Problem Relation Age of Onset  . Throat cancer Father   . Colon cancer Father   . Stroke Brother   . Stroke Mother   . Kidney disease Mother   . Heart disease Mother   . Breast cancer Paternal Aunt     Social History   Social History  . Marital status: Married    Spouse name: N/A  . Number of children: 0  . Years of education: N/A   Occupational History  . Not on file.   Social History Main Topics  . Smoking status: Former Smoker    Types: Cigarettes  . Smokeless tobacco: Never Used     Comment: quit in teens  . Alcohol use No  . Drug use: No  . Sexual activity: Yes    Partners: Female   Other Topics Concern  . Not on file   Social History Narrative  . No narrative on file     Current Outpatient Prescriptions:  .  aspirin 81 MG tablet, Take 1 tablet by mouth daily., Disp: , Rfl:  .  atorvastatin (  LIPITOR) 40 MG tablet, Take 1 tablet (40 mg total) by mouth daily at 6 PM., Disp: 90 tablet, Rfl: 0 .  Cholecalciferol (VITAMIN D-1000 MAX ST PO), Take by mouth daily., Disp: , Rfl:  .  clonazePAM (KLONOPIN) 1 MG tablet, Take 1 tablet (1 mg total) by mouth 3 (three) times daily as needed for anxiety., Disp: 90 tablet, Rfl: 0 .  cloNIDine (CATAPRES) 0.1 MG tablet, Take 1 tablet (0.1 mg total) by mouth 2 (two) times daily., Disp: 180 tablet, Rfl: 0 .  glucose blood test strip, Use as instructed, Disp: 50 each, Rfl: 12 .  loratadine (CLARITIN) 10 MG tablet, Take 10 mg by mouth 2 (two) times daily., Disp: , Rfl:  .  metFORMIN (GLUCOPHAGE-XR) 500 MG 24 hr tablet, Take 1 tablet (500 mg total) by mouth 2 (two) times daily., Disp: 180 tablet, Rfl: 0 .  Omega-3 Fatty  Acids (FISH OIL) 1000 MG CAPS, Take 2 capsules by mouth daily., Disp: , Rfl:  .  omeprazole (PRILOSEC) 40 MG capsule, Take 1 capsule (40 mg total) by mouth daily., Disp: 90 capsule, Rfl: 0 .  oxyCODONE-acetaminophen (PERCOCET) 10-325 MG tablet, Take 1 tablet by mouth every 6 (six) hours as needed. Pt. Takes 5 tabs/ day, Disp: 155 tablet, Rfl: 0 .  telmisartan (MICARDIS) 40 MG tablet, Take 1 tablet (40 mg total) by mouth daily., Disp: 90 tablet, Rfl: 0 .  Vitamin D, Ergocalciferol, (DRISDOL) 50000 units CAPS capsule, Take 1 capsule (50,000 Units total) by mouth once a week. For 12 weeks, Disp: 12 capsule, Rfl: 0 .  zolpidem (AMBIEN) 10 MG tablet, Take 1 tablet (10 mg total) by mouth at bedtime., Disp: 30 tablet, Rfl: 2  Allergies  Allergen Reactions  . Ace Inhibitors Cough  . Diphenhydramine     stomach upset     Review of Systems  Constitutional: Negative for chills, fever and malaise/fatigue.  HENT: Negative for sore throat.   Respiratory: Positive for shortness of breath. Negative for sputum production.   Cardiovascular: Negative for chest pain, orthopnea, leg swelling and PND.  Psychiatric/Behavioral: The patient has insomnia.       Objective  Vitals:   03/24/17 0936  BP: 122/71  Pulse: 67  Resp: 16  Temp: 97.9 F (36.6 C)  TempSrc: Oral  SpO2: 93%  Weight: 225 lb 12.8 oz (102.4 kg)  Height: 5\' 10"  (1.778 m)    Physical Exam  Constitutional: He is oriented to person, place, and time and well-developed, well-nourished, and in no distress.  HENT:  Head: Normocephalic and atraumatic.  Cardiovascular: Normal rate, regular rhythm, S1 normal, S2 normal and normal heart sounds.   No murmur heard. Pulmonary/Chest: Effort normal and breath sounds normal. No respiratory distress. He has no decreased breath sounds. He has no wheezes. He has no rhonchi.  Abdominal: Soft. Bowel sounds are normal. There is no tenderness.  Musculoskeletal:       Right ankle: He exhibits no  swelling.       Left ankle: He exhibits swelling.  Trace pitting edema  Neurological: He is alert and oriented to person, place, and time.  Psychiatric: Mood, memory, affect and judgment normal.  Nursing note and vitals reviewed.      Assessment & Plan  1. Dyspnea on exertion  Could be multifactorial, patient with history of coronary artery disease status post CABG, EKG was reviewed and will refer to cardiology for further management. We will obtain chest x-ray to rule out pulmonary etiology. - CBC with Differential/Platelet -  COMPLETE METABOLIC PANEL WITH GFR - B Nat Peptide - EKG 12-Lead - DG Chest 2 View; Future - Ambulatory referral to Cardiology  2. Insomnia due to other mental disorder DC Ambien started trazodone 50 mg at bedtime as needed, prescription sent to pharmacy - traZODone (DESYREL) 50 MG tablet; Take 0.5-1 tablets (25-50 mg total) by mouth at bedtime as needed for sleep.  Dispense: 30 tablet; Refill: 2  3. Hx of CABG   Aloys Hupfer [facility] 03/24/2017 10:06 AM

## 2017-03-25 ENCOUNTER — Other Ambulatory Visit: Payer: Self-pay | Admitting: Family Medicine

## 2017-03-25 DIAGNOSIS — J9811 Atelectasis: Secondary | ICD-10-CM

## 2017-03-25 DIAGNOSIS — R0602 Shortness of breath: Secondary | ICD-10-CM | POA: Insufficient documentation

## 2017-03-25 HISTORY — DX: Atelectasis: J98.11

## 2017-04-12 NOTE — Progress Notes (Signed)
[facility] Consultation      Assessment and Plan:  The patient is a 71 year old male with dyspnea on exertion, possibly related to underlying emphysema.  Emphysema.  -Hyperinflation seen on recent chest x-ray imaging, possibly consistent with emphysema. The patient is a nonsmoker, but his wife is a smoker. -Given sample of Anoro inhaler, with prescription. -We'll send for PFT, we'll consider referral for pulmonary rehabilitation.  Dyspnea on exertion.  -Multifactorial from possible underlying emphysema, as well as deconditioning. -Patient is scheduled to see cardiology for further workup.  Excessive daytime sleepiness.  -Daytime sleepiness and snoring, the symptoms and signs of obstructive sleep apnea. -We'll send for sleep study.   Date: 04/12/2017  MRN# [id number] Juan Le. 10-Jun-1946  Referring Physician:   [name] is a 71 y.o. old male seen in consultation for chief complaint of:    Chief Complaint  Patient presents with  . Advice Only    referred by Dr. Keith Rake  . atelectasis  . Shortness of Breath    with exhertion    HPI:   He notes th at he has dyspnea with exertion, this has been going on for several years, and progressive. He used to mow the yard with a self propelled, but stopped doing that 3 to 4 years ago. He has dyspnea with vacuuming the floors and has to stop.  He has also noted recently that he had a "spell" in the shower a few weeks ago and since then he has been taking it easy.   He also has trouble falling asleep, he was taking [place] but he had been on it for several years and it was no longer working, now switched to trazodone. Wife is present and gives some of the history and notes that he occasionally snores, he is sleepy during the day and occasionally takes a nap.    Sat walk 04/13/17; at rest on RA sat was 98% and HR 69; after 180 feet sat was 95% and HR 84 mild dyspnea at moderate pace. After 360  feet was 98% and HR 89, moderate dyspnea.   Images personally reviewed, chest x-ray 03/24/17: Mild hyperinflation, cardiomegaly, otherwise unremarkable.   PMHX:   Past Medical History:  Diagnosis Date  . Anxiety   . Chronic, continuous use of opioids   . Degenerative disc disease, lumbar   . Diabetes ([place])   . DJD (degenerative joint disease)   . GERD (gastroesophageal reflux disease)   . History of diverticulitis   . Hypertension   . Hypertension   . Hypertriglyceridemia   . Hypokalemia   . Insomnia   . Mitral regurgitation   . Vitamin D deficiency   . Wears dentures    full upper   Surgical Hx:  Past Surgical History:  Procedure Laterality Date  . COLONOSCOPY WITH PROPOFOL N/A 01/28/2017   Procedure: COLONOSCOPY WITH PROPOFOL;  Surgeon: [name], MD;  Location: [facility];  Service: Endoscopy;  Laterality: N/A;  diabetic - oral meds  . CORONARY ARTERY BYPASS GRAFT  1997  . CORONARY STENT PLACEMENT  11/2006  . FOOT SURGERY  2003  . POLYPECTOMY N/A 01/28/2017   Procedure: POLYPECTOMY;  Surgeon: [name], MD;  Location: [facility];  Service: Endoscopy;  Laterality: N/A;  . UMBILICAL HERNIA REPAIR  04/2012   Family Hx:  Family History  Problem Relation Age of Onset  . Throat cancer Father   . Colon cancer Father   . Stroke Brother   .  Stroke Mother   . Kidney disease Mother   . Heart disease Mother   . Breast cancer Paternal Aunt    Social Hx:   Social History  Substance Use Topics  . Smoking status: Former Smoker    Types: Cigarettes  . Smokeless tobacco: Never Used     Comment: quit in teens  . Alcohol use No   Medication:    Current Outpatient Prescriptions:  .  aspirin 81 MG tablet, Take 1 tablet by mouth daily., Disp: , Rfl:  .  atorvastatin (LIPITOR) 40 MG tablet, Take 1 tablet (40 mg total) by mouth daily at 6 PM., Disp: 90 tablet, Rfl: 0 .  Cholecalciferol (VITAMIN D-1000 MAX ST PO), Take by mouth daily., Disp: , Rfl:  .   clonazePAM (KLONOPIN) 1 MG tablet, Take 1 tablet (1 mg total) by mouth 3 (three) times daily as needed for anxiety., Disp: 90 tablet, Rfl: 0 .  cloNIDine (CATAPRES) 0.1 MG tablet, Take 1 tablet (0.1 mg total) by mouth 2 (two) times daily., Disp: 180 tablet, Rfl: 0 .  glucose blood test strip, Use as instructed, Disp: 50 each, Rfl: 12 .  loratadine (CLARITIN) 10 MG tablet, Take 10 mg by mouth 2 (two) times daily., Disp: , Rfl:  .  metFORMIN (GLUCOPHAGE-XR) 500 MG 24 hr tablet, Take 1 tablet (500 mg total) by mouth 2 (two) times daily., Disp: 180 tablet, Rfl: 0 .  Omega-3 Fatty Acids (FISH OIL) 1000 MG CAPS, Take 2 capsules by mouth daily., Disp: , Rfl:  .  omeprazole (PRILOSEC) 40 MG capsule, Take 1 capsule (40 mg total) by mouth daily., Disp: 90 capsule, Rfl: 0 .  oxyCODONE-acetaminophen (PERCOCET) 10-325 MG tablet, Take 1 tablet by mouth every 6 (six) hours as needed. Pt. Takes 5 tabs/ day, Disp: 155 tablet, Rfl: 0 .  telmisartan (MICARDIS) 40 MG tablet, Take 1 tablet (40 mg total) by mouth daily., Disp: 90 tablet, Rfl: 0 .  traZODone (DESYREL) 50 MG tablet, Take 0.5-1 tablets (25-50 mg total) by mouth at bedtime as needed for sleep., Disp: 30 tablet, Rfl: 2 .  Vitamin D, Ergocalciferol, (DRISDOL) 50000 units CAPS capsule, Take 1 capsule (50,000 Units total) by mouth once a week. For 12 weeks, Disp: 12 capsule, Rfl: 0   Allergies:  Ace inhibitors and Diphenhydramine  Review of Systems: Gen:  Denies  fever, sweats, chills HEENT: Denies blurred vision, double vision. bleeds, sore throat Cvc:  No dizziness, chest pain. Resp:   Denies cough or sputum production. Gi: Denies swallowing difficulty, stomach pain. Gu:  Denies bladder incontinence, burning urine Ext:   No Joint pain, stiffness. Skin: No skin rash,  hives  Endoc:  No polyuria, polydipsia. Psych: No depression, insomnia. Other:  All other systems were reviewed with the patient and were negative other that what is mentioned in the  HPI.   Physical Examination:   VS: BP 140/70 (BP Location: Left Arm, Cuff Size: Normal)   Pulse 68   Resp 16   Ht 5\' 10"  (1.778 m)   Wt 222 lb 9.6 oz (101 kg)   SpO2 98%   BMI 31.94 kg/m   General Appearance: No distress  Neuro:without focal findings,  speech normal,  HEENT: PERRLA, EOM intact.  Mallampati 3. Pulmonary: normal breath sounds, No wheezing.  CardiovascularNormal S1,S2.  No m/r/g.   Abdomen: Benign, Soft, non-tender. Renal:  No costovertebral tenderness  GU:  No performed at this time. Endoc: No evident thyromegaly, no signs of acromegaly. Skin:  warm, no rashes, no ecchymosis  Extremities: normal, no cyanosis, clubbing.  Other findings:    LABORATORY PANEL:   CBC No results for input(s): WBC, HGB, HCT, PLT in the last 168 hours. ------------------------------------------------------------------------------------------------------------------  Chemistries  No results for input(s): NA, K, CL, CO2, GLUCOSE, BUN, CREATININE, CALCIUM, MG, AST, ALT, ALKPHOS, BILITOT in the last 168 hours.  Invalid input(s): GFRCGP ------------------------------------------------------------------------------------------------------------------  Cardiac Enzymes No results for input(s): TROPONINI in the last 168 hours. ------------------------------------------------------------  RADIOLOGY:  No results found.     Thank  you for the consultation and for allowing [facility] to assist in the care of your patient. Our recommendations are noted above.  Please contact us if we can be of further service.   Marda Stalker, MD.  Board Certified in Internal Medicine, Pulmonary Medicine, [place], and Sleep Medicine.  [facility] Office Number: [phone]  Patricia Pesa, M.D.  Merton Border, M.D  04/12/2017

## 2017-04-13 ENCOUNTER — Telehealth: Payer: Self-pay | Admitting: Internal Medicine

## 2017-04-13 ENCOUNTER — Encounter: Payer: Self-pay | Admitting: Internal Medicine

## 2017-04-13 ENCOUNTER — Ambulatory Visit (INDEPENDENT_AMBULATORY_CARE_PROVIDER_SITE_OTHER): Payer: Medicare Other | Admitting: Internal Medicine

## 2017-04-13 VITALS — BP 140/70 | HR 68 | Resp 16 | Ht 70.0 in | Wt 222.6 lb

## 2017-04-13 DIAGNOSIS — G4719 Other hypersomnia: Secondary | ICD-10-CM | POA: Diagnosis not present

## 2017-04-13 DIAGNOSIS — J449 Chronic obstructive pulmonary disease, unspecified: Secondary | ICD-10-CM

## 2017-04-13 MED ORDER — UMECLIDINIUM-VILANTEROL 62.5-25 MCG/INH IN AEPB: 1.0000 | INHALATION_SPRAY | Freq: Every day | RESPIRATORY_TRACT | 11 refills | Status: DC

## 2017-04-13 MED ORDER — UMECLIDINIUM-VILANTEROL 62.5-25 MCG/INH IN AEPB: 1.0000 | INHALATION_SPRAY | Freq: Every day | RESPIRATORY_TRACT | 0 refills | Status: DC

## 2017-04-13 NOTE — Patient Instructions (Signed)
Start anoro once daily.   Will send for pulmonary function test.   Will send for sleep study.

## 2017-04-13 NOTE — Telephone Encounter (Signed)
lmov for patient to let him be aware that for the visit today we were not going to use his Medicaid For the medicaid is only used for Premiums  We are able to use his [facility] and filed it   If he had any questions he could call back

## 2017-04-15 ENCOUNTER — Telehealth: Payer: Self-pay | Admitting: Family Medicine

## 2017-04-15 DIAGNOSIS — E785 Hyperlipidemia, unspecified: Secondary | ICD-10-CM

## 2017-04-15 MED ORDER — ATORVASTATIN CALCIUM 40 MG PO TABS: 40.0000 mg | ORAL_TABLET | Freq: Every day | ORAL | 0 refills | Status: DC

## 2017-04-15 NOTE — Telephone Encounter (Signed)
Medication has been refilled and sent to [facility]

## 2017-04-15 NOTE — Telephone Encounter (Signed)
Requesting refill on atorvastatin and is asking that you send to [facility] [address]

## 2017-04-21 ENCOUNTER — Ambulatory Visit (INDEPENDENT_AMBULATORY_CARE_PROVIDER_SITE_OTHER): Payer: Medicare Other | Admitting: Family Medicine

## 2017-04-21 VITALS — BP 108/62 | HR 88 | Temp 98.1°F | Resp 18 | Ht 70.0 in | Wt 218.5 lb

## 2017-04-21 DIAGNOSIS — F419 Anxiety disorder, unspecified: Secondary | ICD-10-CM | POA: Diagnosis not present

## 2017-04-21 DIAGNOSIS — G8929 Other chronic pain: Secondary | ICD-10-CM

## 2017-04-21 DIAGNOSIS — M542 Cervicalgia: Secondary | ICD-10-CM

## 2017-04-21 DIAGNOSIS — E785 Hyperlipidemia, unspecified: Secondary | ICD-10-CM | POA: Diagnosis not present

## 2017-04-21 DIAGNOSIS — E119 Type 2 diabetes mellitus without complications: Secondary | ICD-10-CM | POA: Diagnosis not present

## 2017-04-21 LAB — COMPLETE METABOLIC PANEL WITH GFR
ALBUMIN: 4.6 g/dL (ref 3.6–5.1)
ALK PHOS: 64 U/L (ref 40–115)
ALT: 48 U/L — ABNORMAL HIGH (ref 9–46)
AST: 24 U/L (ref 10–35)
BUN: 16 mg/dL (ref 7–25)
CALCIUM: 9.7 mg/dL (ref 8.6–10.3)
CO2: 18 mmol/L — ABNORMAL LOW (ref 20–31)
Chloride: 102 mmol/L (ref 98–110)
Creat: 1.01 mg/dL (ref 0.70–1.18)
GFR, EST AFRICAN AMERICAN: 86 mL/min (ref >=60)
GFR, EST NON AFRICAN AMERICAN: 74 mL/min (ref >=60)
GLUCOSE: 117 mg/dL — AB (ref 65–99)
POTASSIUM: 4.1 mmol/L (ref 3.5–5.3)
SODIUM: 136 mmol/L (ref 135–146)
Total Bilirubin: 0.5 mg/dL (ref 0.2–1.2)
Total Protein: 7.4 g/dL (ref 6.1–8.1)

## 2017-04-21 LAB — LIPID PANEL
CHOL/HDL RATIO: 4.1 ratio (ref <5.0)
Cholesterol: 123 mg/dL (ref <200)
HDL: 30 mg/dL — AB (ref >40)
LDL Cholesterol: 51 mg/dL (ref <100)
TRIGLYCERIDES: 209 mg/dL — AB (ref <150)
VLDL: 42 mg/dL — ABNORMAL HIGH (ref <30)

## 2017-04-21 MED ORDER — OXYCODONE-ACETAMINOPHEN 10-325 MG PO TABS: 1.0000 | ORAL_TABLET | Freq: Four times a day (QID) | ORAL | 0 refills | Status: DC | PRN

## 2017-04-21 MED ORDER — METFORMIN HCL ER 500 MG PO TB24: 500.0000 mg | ORAL_TABLET | Freq: Two times a day (BID) | ORAL | 0 refills | Status: DC

## 2017-04-21 MED ORDER — CLONAZEPAM 1 MG PO TABS: 1.0000 mg | ORAL_TABLET | Freq: Three times a day (TID) | ORAL | 0 refills | Status: DC | PRN

## 2017-04-21 NOTE — Progress Notes (Signed)
Name: Juan Le.   MRN: [id number]    DOB: Jan 08, 1946   Date:04/21/2017       Progress Note  Subjective  Chief Complaint  Chief Complaint  Patient presents with  . Follow-up    1 month F/U  . Diabetes    Checks BS a couple times a week, Lowest- 103 Average-137  . Neck Pain    Unchanged  . Anxiety    Needs refill  . Hypertension    Diabetes  He presents for his follow-up diabetic visit. He has type 2 diabetes mellitus. His disease course has been stable. Pertinent negatives for hypoglycemia include no nervousness/anxiousness. Pertinent negatives for diabetes include no foot paresthesias, no polydipsia and no polyuria. Risk factors for coronary artery disease include dyslipidemia. Current diabetic treatment includes oral agent (monotherapy). He is following a generally healthy diet. He monitors blood glucose at home 1-2 x per week. His breakfast blood glucose range is generally 110-130 mg/dl. An ACE inhibitor/angiotensin II receptor blocker is being taken. Eye exam is current.  Neck Pain   This is a chronic problem. The problem occurs constantly. The problem has been unchanged. The pain is present in the midline. The pain is at a severity of 2/10. The symptoms are aggravated by bending and position. Pertinent negatives include no leg pain (Right leg pain and numbness,). He has tried oral narcotics for the symptoms.  Anxiety  Presents for follow-up visit. Patient reports no depressed mood, excessive worry, insomnia, nervous/anxious behavior or panic. The severity of symptoms is moderate and causing significant distress.       Past Medical History:  Diagnosis Date  . Anxiety   . Chronic, continuous use of opioids   . Degenerative disc disease, lumbar   . Diabetes ([place])   . DJD (degenerative joint disease)   . GERD (gastroesophageal reflux disease)   . History of diverticulitis   . Hypertension   . Hypertension   . Hypertriglyceridemia   . Hypokalemia   . Insomnia   .  Mitral regurgitation   . Vitamin D deficiency   . Wears dentures    full upper    Past Surgical History:  Procedure Laterality Date  . COLONOSCOPY WITH PROPOFOL N/A 01/28/2017   Procedure: COLONOSCOPY WITH PROPOFOL;  Surgeon: [name], MD;  Location: [facility];  Service: Endoscopy;  Laterality: N/A;  diabetic - oral meds  . CORONARY ARTERY BYPASS GRAFT  1997  . CORONARY STENT PLACEMENT  11/2006  . FOOT SURGERY  2003  . POLYPECTOMY N/A 01/28/2017   Procedure: POLYPECTOMY;  Surgeon: [name], MD;  Location: [facility];  Service: Endoscopy;  Laterality: N/A;  . UMBILICAL HERNIA REPAIR  04/2012    Family History  Problem Relation Age of Onset  . Throat cancer Father   . Colon cancer Father   . Stroke Brother   . Stroke Mother   . Kidney disease Mother   . Heart disease Mother   . Breast cancer Paternal Aunt     Social History   Social History  . Marital status: Married    Spouse name: N/A  . Number of children: 0  . Years of education: N/A   Occupational History  . Not on file.   Social History Main Topics  . Smoking status: Former Smoker    Types: Cigarettes  . Smokeless tobacco: Never Used     Comment: quit in teens  . Alcohol use No  . Drug use: No  . Sexual  activity: Yes    Partners: Female   Other Topics Concern  . Not on file   Social History Narrative  . No narrative on file     Current Outpatient Prescriptions:  .  aspirin 81 MG tablet, Take 1 tablet by mouth daily., Disp: , Rfl:  .  atorvastatin (LIPITOR) 40 MG tablet, Take 1 tablet (40 mg total) by mouth daily at 6 PM., Disp: 90 tablet, Rfl: 0 .  Cholecalciferol (VITAMIN D-1000 MAX ST PO), Take by mouth daily., Disp: , Rfl:  .  clonazePAM (KLONOPIN) 1 MG tablet, Take 1 tablet (1 mg total) by mouth 3 (three) times daily as needed for anxiety., Disp: 90 tablet, Rfl: 0 .  cloNIDine (CATAPRES) 0.1 MG tablet, Take 1 tablet (0.1 mg total) by mouth 2 (two) times daily., Disp: 180  tablet, Rfl: 0 .  glucose blood test strip, Use as instructed, Disp: 50 each, Rfl: 12 .  loratadine (CLARITIN) 10 MG tablet, Take 10 mg by mouth 2 (two) times daily., Disp: , Rfl:  .  metFORMIN (GLUCOPHAGE-XR) 500 MG 24 hr tablet, Take 1 tablet (500 mg total) by mouth 2 (two) times daily., Disp: 180 tablet, Rfl: 0 .  Omega-3 Fatty Acids (FISH OIL) 1000 MG CAPS, Take 2 capsules by mouth daily., Disp: , Rfl:  .  omeprazole (PRILOSEC) 40 MG capsule, Take 1 capsule (40 mg total) by mouth daily., Disp: 90 capsule, Rfl: 0 .  oxyCODONE-acetaminophen (PERCOCET) 10-325 MG tablet, Take 1 tablet by mouth every 6 (six) hours as needed. Pt. Takes 5 tabs/ day, Disp: 155 tablet, Rfl: 0 .  telmisartan (MICARDIS) 40 MG tablet, Take 1 tablet (40 mg total) by mouth daily., Disp: 90 tablet, Rfl: 0 .  traZODone (DESYREL) 50 MG tablet, Take 0.5-1 tablets (25-50 mg total) by mouth at bedtime as needed for sleep., Disp: 30 tablet, Rfl: 2 .  umeclidinium-vilanterol (ANORO ELLIPTA) 62.5-25 MCG/INH AEPB, Inhale 1 puff into the lungs daily., Disp: 1 each, Rfl: 0 .  Vitamin D, Ergocalciferol, (DRISDOL) 50000 units CAPS capsule, Take 1 capsule (50,000 Units total) by mouth once a week. For 12 weeks, Disp: 12 capsule, Rfl: 0  Allergies  Allergen Reactions  . Ace Inhibitors Cough  . Diphenhydramine     stomach upset     Review of Systems  Musculoskeletal: Positive for neck pain.  Endo/Heme/Allergies: Negative for polydipsia.  Psychiatric/Behavioral: The patient is not nervous/anxious and does not have insomnia.       Objective  Vitals:   04/21/17 0821  BP: 108/62  Pulse: 88  Resp: 18  Temp: 98.1 F (36.7 C)  TempSrc: Oral  SpO2: 99%  Weight: 218 lb 8 oz (99.1 kg)  Height: 5\' 10"  (1.778 m)    Physical Exam  Constitutional: He is oriented to person, place, and time and well-developed, well-nourished, and in no distress.  HENT:  Head: Normocephalic and atraumatic.  Cardiovascular: Normal rate, regular  rhythm, S1 normal, S2 normal and normal heart sounds.   No murmur heard. Pulmonary/Chest: Effort normal and breath sounds normal. He has no wheezes. He has no rhonchi.  Abdominal: Soft. Bowel sounds are normal.  Musculoskeletal:       Right ankle: He exhibits no swelling.       Left ankle: He exhibits no swelling.       Cervical back: He exhibits tenderness, pain and spasm.       Thoracic back: He exhibits tenderness, pain and spasm.       Back:  Neurological: He is alert and oriented to person, place, and time.  Psychiatric: Mood, memory, affect and judgment normal.  Nursing note and vitals reviewed.    Assessment & Plan  1. Anxiety Stable and responsive to clonazepam, refills provided - clonazePAM (KLONOPIN) 1 MG tablet; Take 1 tablet (1 mg total) by mouth 3 (three) times daily as needed for anxiety.  Dispense: 90 tablet; Refill: 0  2. Chronic neck pain In is controlled as long as he is on Percocet taken as prescribed, compliant with the controlled substance agreement. Refills provided - oxyCODONE-acetaminophen (PERCOCET) 10-325 MG tablet; Take 1 tablet by mouth every 6 (six) hours as needed. Pt. Takes 5 tabs/ day  Dispense: 155 tablet; Refill: 0  3. Type 2 diabetes mellitus without complication, without long-term current use of insulin (HCC)  - metFORMIN (GLUCOPHAGE-XR) 500 MG 24 hr tablet; Take 1 tablet (500 mg total) by mouth 2 (two) times daily.  Dispense: 180 tablet; Refill: 0 - Urine Microalbumin w/creat. ratio  4. Dyslipidemia  - Lipid panel - COMPLETE METABOLIC PANEL WITH GFR   Hiba Garry [facility] A[facility] 04/21/2017 8:36 AM

## 2017-04-22 LAB — MICROALBUMIN / CREATININE URINE RATIO
Creatinine, Urine: 206 mg/dL (ref 20–370)
MICROALB/CREAT RATIO: 6 ug/mg{creat} (ref <30)
Microalb, Ur: 1.2 mg/dL

## 2017-04-27 ENCOUNTER — Encounter: Payer: Self-pay | Admitting: Internal Medicine

## 2017-04-27 ENCOUNTER — Ambulatory Visit: Payer: Medicare Other | Attending: Internal Medicine

## 2017-04-27 DIAGNOSIS — G4733 Obstructive sleep apnea (adult) (pediatric): Secondary | ICD-10-CM | POA: Insufficient documentation

## 2017-04-27 DIAGNOSIS — R400 Somnolence: Secondary | ICD-10-CM | POA: Diagnosis present

## 2017-04-27 DIAGNOSIS — J9811 Atelectasis: Secondary | ICD-10-CM | POA: Diagnosis present

## 2017-04-27 DIAGNOSIS — Z951 Presence of aortocoronary bypass graft: Secondary | ICD-10-CM | POA: Diagnosis present

## 2017-04-27 DIAGNOSIS — G4719 Other hypersomnia: Secondary | ICD-10-CM

## 2017-05-09 DIAGNOSIS — G4733 Obstructive sleep apnea (adult) (pediatric): Secondary | ICD-10-CM | POA: Diagnosis not present

## 2017-05-10 ENCOUNTER — Telehealth: Payer: Self-pay | Admitting: *Deleted

## 2017-05-10 DIAGNOSIS — G4733 Obstructive sleep apnea (adult) (pediatric): Secondary | ICD-10-CM

## 2017-05-10 NOTE — Telephone Encounter (Signed)
-----   Message from Laverle Hobby, MD sent at 05/09/2017  6:07 PM EDT ----- Regarding: split night results.  Sleep study showed OSA with AHI of 24.  Recommend 8-14 cm H2O.

## 2017-05-20 ENCOUNTER — Ambulatory Visit (INDEPENDENT_AMBULATORY_CARE_PROVIDER_SITE_OTHER): Payer: Medicare Other | Admitting: Family Medicine

## 2017-05-20 ENCOUNTER — Encounter: Payer: Self-pay | Admitting: Family Medicine

## 2017-05-20 DIAGNOSIS — G8929 Other chronic pain: Secondary | ICD-10-CM | POA: Diagnosis not present

## 2017-05-20 DIAGNOSIS — K219 Gastro-esophageal reflux disease without esophagitis: Secondary | ICD-10-CM

## 2017-05-20 DIAGNOSIS — I10 Essential (primary) hypertension: Secondary | ICD-10-CM | POA: Diagnosis not present

## 2017-05-20 DIAGNOSIS — F419 Anxiety disorder, unspecified: Secondary | ICD-10-CM | POA: Diagnosis not present

## 2017-05-20 DIAGNOSIS — M542 Cervicalgia: Secondary | ICD-10-CM | POA: Diagnosis not present

## 2017-05-20 MED ORDER — OMEPRAZOLE 40 MG PO CPDR: 40.0000 mg | DELAYED_RELEASE_CAPSULE | Freq: Every day | ORAL | 0 refills | Status: DC

## 2017-05-20 MED ORDER — CLONAZEPAM 1 MG PO TABS: 1.0000 mg | ORAL_TABLET | Freq: Three times a day (TID) | ORAL | 0 refills | Status: DC | PRN

## 2017-05-20 MED ORDER — OXYCODONE-ACETAMINOPHEN 10-325 MG PO TABS: 1.0000 | ORAL_TABLET | Freq: Four times a day (QID) | ORAL | 0 refills | Status: DC | PRN

## 2017-05-20 MED ORDER — CLONIDINE HCL 0.1 MG PO TABS: 0.1000 mg | ORAL_TABLET | Freq: Two times a day (BID) | ORAL | 0 refills | Status: DC

## 2017-05-20 NOTE — Progress Notes (Signed)
Name: Juan Le.   MRN: [id number]    DOB: 08-18-1946   Date:05/20/2017       Progress Note  Subjective  Chief Complaint  Chief Complaint  Patient presents with  . Follow-up    1 mo  . Medication Refill  . Diabetes    Neck Pain   This is a chronic problem. The problem occurs constantly. The problem has been unchanged. The pain is present in the midline, right side and left side. The quality of the pain is described as aching. The pain is at a severity of 2/10. The symptoms are aggravated by bending and position (bending, lifting, getting out of bed). Pertinent negatives include no chest pain, headaches or leg pain. He has tried oral narcotics for the symptoms.  Anxiety  Presents for follow-up visit. The problem has been unchanged. Symptoms include depressed mood, insomnia and nervous/anxious behavior. Patient reports no chest pain, excessive worry, muscle tension, nausea, palpitations or panic. Symptoms occur most days. The severity of symptoms is severe and causing significant distress.   Past treatments include benzodiazephines. The treatment provided significant relief. Compliance with prior treatments has been good.  Gastroesophageal Reflux  He reports no abdominal pain, no belching, no chest pain, no heartburn or no nausea. This is a chronic problem. The problem has been unchanged. He has tried a PPI for the symptoms. The treatment provided significant relief.  Hypertension  This is a chronic problem. The problem is unchanged. The problem is controlled. Associated symptoms include anxiety and neck pain. Pertinent negatives include no blurred vision, chest pain, headaches or palpitations. Past treatments include central alpha agonists. Hypertensive end-organ damage includes CAD/MI (s/p CABG). There is no history of kidney disease or CVA.     Past Medical History:  Diagnosis Date  . Anxiety   . Chronic, continuous use of opioids   . Degenerative disc disease, lumbar   .  Diabetes ([place])   . DJD (degenerative joint disease)   . GERD (gastroesophageal reflux disease)   . History of diverticulitis   . Hypertension   . Hypertension   . Hypertriglyceridemia   . Hypokalemia   . Insomnia   . Mitral regurgitation   . Vitamin D deficiency   . Wears dentures    full upper    Past Surgical History:  Procedure Laterality Date  . COLONOSCOPY WITH PROPOFOL N/A 01/28/2017   Procedure: COLONOSCOPY WITH PROPOFOL;  Surgeon: [name], MD;  Location: [place] [facility];  Service: Endoscopy;  Laterality: N/A;  diabetic - oral meds  . CORONARY ARTERY BYPASS GRAFT  1997  . CORONARY STENT PLACEMENT  11/2006  . FOOT SURGERY  2003  . POLYPECTOMY N/A 01/28/2017   Procedure: POLYPECTOMY;  Surgeon: [name], MD;  Location: [facility] [facility];  Service: Endoscopy;  Laterality: N/A;  . UMBILICAL HERNIA REPAIR  04/2012    Family History  Problem Relation Age of Onset  . Throat cancer Father   . Colon cancer Father   . Stroke Brother   . Stroke Mother   . Kidney disease Mother   . Heart disease Mother   . Breast cancer Paternal Aunt     Social History   Social History  . Marital status: Married    Spouse name: N/A  . Number of children: 0  . Years of education: N/A   Occupational History  . Not on file.   Social History Main Topics  . Smoking status: Former Smoker    Types: Cigarettes  .  Smokeless tobacco: Never Used     Comment: quit in teens  . Alcohol use No  . Drug use: No  . Sexual activity: Yes    Partners: Female   Other Topics Concern  . Not on file   Social History Narrative  . No narrative on file     Current Outpatient Prescriptions:  .  aspirin 81 MG tablet, Take 1 tablet by mouth daily., Disp: , Rfl:  .  atorvastatin (LIPITOR) 40 MG tablet, Take 1 tablet (40 mg total) by mouth daily at 6 PM., Disp: 90 tablet, Rfl: 0 .  Cholecalciferol (VITAMIN D-1000 MAX ST PO), Take by mouth daily., Disp: , Rfl:  .  clonazePAM (KLONOPIN) 1 MG  tablet, Take 1 tablet (1 mg total) by mouth 3 (three) times daily as needed for anxiety., Disp: 90 tablet, Rfl: 0 .  cloNIDine (CATAPRES) 0.1 MG tablet, Take 1 tablet (0.1 mg total) by mouth 2 (two) times daily., Disp: 180 tablet, Rfl: 0 .  glucose blood test strip, Use as instructed, Disp: 50 each, Rfl: 12 .  loratadine (CLARITIN) 10 MG tablet, Take 10 mg by mouth 2 (two) times daily., Disp: , Rfl:  .  metFORMIN (GLUCOPHAGE-XR) 500 MG 24 hr tablet, Take 1 tablet (500 mg total) by mouth 2 (two) times daily., Disp: 180 tablet, Rfl: 0 .  Omega-3 Fatty Acids (FISH OIL) 1000 MG CAPS, Take 2 capsules by mouth daily., Disp: , Rfl:  .  omeprazole (PRILOSEC) 40 MG capsule, Take 1 capsule (40 mg total) by mouth daily., Disp: 90 capsule, Rfl: 0 .  oxyCODONE-acetaminophen (PERCOCET) 10-325 MG tablet, Take 1 tablet by mouth every 6 (six) hours as needed. Pt. Takes 5 tabs/ day, Disp: 155 tablet, Rfl: 0 .  telmisartan (MICARDIS) 40 MG tablet, Take 1 tablet (40 mg total) by mouth daily., Disp: 90 tablet, Rfl: 0 .  traZODone (DESYREL) 50 MG tablet, Take 0.5-1 tablets (25-50 mg total) by mouth at bedtime as needed for sleep., Disp: 30 tablet, Rfl: 2 .  umeclidinium-vilanterol (ANORO ELLIPTA) 62.5-25 MCG/INH AEPB, Inhale 1 puff into the lungs daily., Disp: 1 each, Rfl: 0 .  Vitamin D, Ergocalciferol, (DRISDOL) 50000 units CAPS capsule, Take 1 capsule (50,000 Units total) by mouth once a week. For 12 weeks (Patient not taking: Reported on 05/20/2017), Disp: 12 capsule, Rfl: 0  Allergies  Allergen Reactions  . Ace Inhibitors Cough  . Diphenhydramine     stomach upset     Review of Systems  Eyes: Negative for blurred vision.  Cardiovascular: Negative for chest pain and palpitations.  Gastrointestinal: Negative for abdominal pain, heartburn and nausea.  Musculoskeletal: Positive for neck pain.  Neurological: Negative for headaches.  Psychiatric/Behavioral: The patient is nervous/anxious and has insomnia.       Objective  Vitals:   05/20/17 1106  BP: 118/76  Pulse: 81  Resp: 16  Temp: 98.2 F (36.8 C)  TempSrc: Oral  SpO2: 97%  Weight: 219 lb 9.6 oz (99.6 kg)  Height: 5\' 10"  (1.778 m)    Physical Exam  Constitutional: He is oriented to person, place, and time and well-developed, well-nourished, and in no distress.  Cardiovascular: Normal rate, regular rhythm and normal heart sounds.   No murmur heard. Pulmonary/Chest: Effort normal and breath sounds normal. He has no wheezes.  Abdominal: Soft. Bowel sounds are normal.  Musculoskeletal: He exhibits no edema.       Back:  Neurological: He is alert and oriented to person, place, and time.  Psychiatric:  Mood, memory, affect and judgment normal.  Nursing note and vitals reviewed.    Assessment & Plan  1. Anxiety Stable, responsive to clonazepam, refills provided - clonazePAM (KLONOPIN) 1 MG tablet; Take 1 tablet (1 mg total) by mouth 3 (three) times daily as needed for anxiety.  Dispense: 90 tablet; Refill: 0  2. Chronic neck pain Chronic neck pain responsive to opioid treatment, patient compliant with controlled substances agreement and understands the dependence potential, side effects and drug interactions of opioids, refills provided - oxyCODONE-acetaminophen (PERCOCET) 10-325 MG tablet; Take 1 tablet by mouth every 6 (six) hours as needed. Pt. Takes 5 tabs/ day  Dispense: 155 tablet; Refill: 0  3. Essential hypertension  - cloNIDine (CATAPRES) 0.1 MG tablet; Take 1 tablet (0.1 mg total) by mouth 2 (two) times daily.  Dispense: 180 tablet; Refill: 0  4. Gastroesophageal reflux disease, esophagitis presence not specified  - omeprazole (PRILOSEC) 40 MG capsule; Take 1 capsule (40 mg total) by mouth daily.  Dispense: 90 capsule; Refill: 0   Sayre Witherington [facility] 05/20/2017 11:38 AM

## 2017-05-24 DIAGNOSIS — Z5321 Procedure and treatment not carried out due to patient leaving prior to being seen by health care provider: Secondary | ICD-10-CM | POA: Diagnosis not present

## 2017-05-24 DIAGNOSIS — R0789 Other chest pain: Secondary | ICD-10-CM | POA: Diagnosis not present

## 2017-05-24 DIAGNOSIS — T148XXA Other injury of unspecified body region, initial encounter: Secondary | ICD-10-CM | POA: Diagnosis not present

## 2017-05-24 DIAGNOSIS — M79603 Pain in arm, unspecified: Secondary | ICD-10-CM | POA: Diagnosis not present

## 2017-06-16 ENCOUNTER — Encounter: Payer: Self-pay | Admitting: Family Medicine

## 2017-06-16 ENCOUNTER — Ambulatory Visit (INDEPENDENT_AMBULATORY_CARE_PROVIDER_SITE_OTHER): Payer: Medicare Other | Admitting: Family Medicine

## 2017-06-16 DIAGNOSIS — M542 Cervicalgia: Secondary | ICD-10-CM | POA: Diagnosis not present

## 2017-06-16 DIAGNOSIS — G8929 Other chronic pain: Secondary | ICD-10-CM

## 2017-06-16 DIAGNOSIS — I10 Essential (primary) hypertension: Secondary | ICD-10-CM | POA: Diagnosis not present

## 2017-06-16 DIAGNOSIS — F419 Anxiety disorder, unspecified: Secondary | ICD-10-CM

## 2017-06-16 MED ORDER — CLONAZEPAM 1 MG PO TABS: 1.0000 mg | ORAL_TABLET | Freq: Three times a day (TID) | ORAL | 0 refills | Status: DC | PRN

## 2017-06-16 MED ORDER — TELMISARTAN 40 MG PO TABS: 40.0000 mg | ORAL_TABLET | Freq: Every day | ORAL | 0 refills | Status: DC

## 2017-06-16 MED ORDER — OXYCODONE-ACETAMINOPHEN 10-325 MG PO TABS: 1.0000 | ORAL_TABLET | Freq: Four times a day (QID) | ORAL | 0 refills | Status: DC | PRN
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
## 2017-07-04 ENCOUNTER — Telehealth: Payer: Self-pay | Admitting: Family Medicine

## 2017-07-04 NOTE — Telephone Encounter (Signed)
Pt needs refill on Trazodone

## 2017-07-05 ENCOUNTER — Ambulatory Visit: Payer: Medicare Other | Attending: Internal Medicine

## 2017-07-05 ENCOUNTER — Other Ambulatory Visit: Payer: Self-pay | Admitting: Family Medicine

## 2017-07-05 DIAGNOSIS — F99 Mental disorder, not otherwise specified: Principal | ICD-10-CM

## 2017-07-05 DIAGNOSIS — F5105 Insomnia due to other mental disorder: Secondary | ICD-10-CM

## 2017-07-05 MED ORDER — TRAZODONE HCL 50 MG PO TABS: 25.0000 mg | ORAL_TABLET | Freq: Every evening | ORAL | 2 refills | Status: DC | PRN

## 2017-07-05 NOTE — Telephone Encounter (Signed)
Dr. Manuella Ghazi, Pt needs refill on Trazodone. Please review

## 2017-07-05 NOTE — Telephone Encounter (Signed)
LMOM to inform pt °

## 2017-07-05 NOTE — Telephone Encounter (Signed)
Prescription for trazodone has been sent to patient's pharmacy

## 2017-07-07 NOTE — Progress Notes (Signed)
[facility] Consultation      Assessment and Plan:  The patient is a 71 year old male with OSA and dyspnea on exertion, due to emphysema.   Emphysema.  -Hyperinflation seen on recent chest x-ray imaging, possibly consistent with emphysema. The patient is a nonsmoker, but his wife is a smoker. -Continue Anoro. He declines PFT.  --He does not want to do pulmonary rehab.   Dyspnea on exertion.  -Multifactorial from possible underlying emphysema, as well as deconditioning.  Obstructive sleep apnea -Sleep study 04/27/17; AHI equals 24. Started on AutoPap 8-14. -He declines to use PAP.    Date: 07/07/2017  MRN# [id number] Juan Le. 26-Jun-1946   Juan Le. is a 71 y.o. old male seen in consultation for chief complaint of:    Chief Complaint  Patient presents with  . COPD    Pt here for 3 mos f/u sob. Pt did not go have PFT done. Pt also had sleep study done.  . Shortness of Breath    Per patient resolved.  . Sleep Apnea    pt is not sure he wants to be set up on cpap.    HPI:   He notes th at he has dyspnea with exertion, this has been going on for several years, and progressive. He used to mow the yard with a self propelled, but stopped doing that 3 to 4 years ago. He has dyspnea with vacuuming the floors and has to stop.  He has also noted recently that he had a "spell" in the shower a few weeks ago and since then he has been taking it easy. At last visit was sent for a PFT, sleep study, he was going to see the cardiologist. He did not do the PFT as he did not feel like sitting there for an hour and he thought he would "fail" it. He decided that he did not want to use CPAP because his wife had one and he did not think that he would be able to use. It.   He has been using the anoro and finds that it helps his breathing, he still gets winded.    **Sat walk 04/13/17; at rest on RA sat was 98% and HR 69; after 180 feet sat was 95% and HR 84  mild dyspnea at moderate pace. After 360 feet was 98% and HR 89, moderate dyspnea.   **chest x-ray 03/24/17: Mild hyperinflation, cardiomegaly, otherwise unremarkable.  Medication:    Current Outpatient Prescriptions:  .  aspirin 81 MG tablet, Take 1 tablet by mouth daily., Disp: , Rfl:  .  atorvastatin (LIPITOR) 40 MG tablet, Take 1 tablet (40 mg total) by mouth daily at 6 PM., Disp: 90 tablet, Rfl: 0 .  Cholecalciferol (VITAMIN D-1000 MAX ST PO), Take by mouth daily., Disp: , Rfl:  .  clonazePAM (KLONOPIN) 1 MG tablet, Take 1 tablet (1 mg total) by mouth 3 (three) times daily as needed for anxiety., Disp: 90 tablet, Rfl: 0 .  cloNIDine (CATAPRES) 0.1 MG tablet, Take 1 tablet (0.1 mg total) by mouth 2 (two) times daily., Disp: 180 tablet, Rfl: 0 .  glucose blood test strip, Use as instructed, Disp: 50 each, Rfl: 12 .  loratadine (CLARITIN) 10 MG tablet, Take 10 mg by mouth 2 (two) times daily., Disp: , Rfl:  .  metFORMIN (GLUCOPHAGE-XR) 500 MG 24 hr tablet, Take 1 tablet (500 mg total) by mouth 2 (two) times daily., Disp: 180 tablet, Rfl: 0 .  Omega-3 Fatty Acids (FISH OIL) 1000 MG CAPS, Take 2 capsules by mouth daily., Disp: , Rfl:  .  omeprazole (PRILOSEC) 40 MG capsule, Take 1 capsule (40 mg total) by mouth daily., Disp: 90 capsule, Rfl: 0 .  oxyCODONE-acetaminophen (PERCOCET) 10-325 MG tablet, Take 1 tablet by mouth every 6 (six) hours as needed. Pt. Takes 5 tabs/ day, Disp: 155 tablet, Rfl: 0 .  telmisartan (MICARDIS) 40 MG tablet, Take 1 tablet (40 mg total) by mouth daily., Disp: 90 tablet, Rfl: 0 .  traZODone (DESYREL) 50 MG tablet, Take 0.5-1 tablets (25-50 mg total) by mouth at bedtime as needed for sleep., Disp: 30 tablet, Rfl: 2 .  umeclidinium-vilanterol (ANORO ELLIPTA) 62.5-25 MCG/INH AEPB, Inhale 1 puff into the lungs daily., Disp: 1 each, Rfl: 0   Allergies:  Ace inhibitors and Diphenhydramine  Review of Systems: Gen:  Denies  fever, sweats, chills HEENT: Denies blurred  vision, double vision. bleeds, sore throat Cvc:  No dizziness, chest pain. Resp:   Denies cough or sputum production. Gi: Denies swallowing difficulty, stomach pain. Gu:  Denies bladder incontinence, burning urine Ext:   No Joint pain, stiffness. Skin: No skin rash,  hives  Endoc:  No polyuria, polydipsia. Psych: No depression, insomnia. Other:  All other systems were reviewed with the patient and were negative other that what is mentioned in the HPI.   Physical Examination:   VS: BP 130/80 (BP Location: Left Arm, Cuff Size: Normal)   Pulse 71   Resp 16   Ht 5\' 10"  (1.778 m)   Wt 202 lb (91.6 kg)   SpO2 97%   BMI 28.98 kg/m   General Appearance: No distress  Neuro:without focal findings,  speech normal,  HEENT: PERRLA, EOM intact.  Mallampati 3. Pulmonary: normal breath sounds, No wheezing.  CardiovascularNormal S1,S2.  No m/r/g.   Abdomen: Benign, Soft, non-tender. Renal:  No costovertebral tenderness  GU:  No performed at this time. Endoc: No evident thyromegaly, no signs of acromegaly. Skin:   warm, no rashes, no ecchymosis  Extremities: normal, no cyanosis, clubbing.  Other findings:    LABORATORY PANEL:   CBC No results for input(s): WBC, HGB, HCT, PLT in the last 168 hours. ------------------------------------------------------------------------------------------------------------------  Chemistries  No results for input(s): NA, K, CL, CO2, GLUCOSE, BUN, CREATININE, CALCIUM, MG, AST, ALT, ALKPHOS, BILITOT in the last 168 hours.  Invalid input(s): GFRCGP ------------------------------------------------------------------------------------------------------------------  Cardiac Enzymes No results for input(s): TROPONINI in the last 168 hours. ------------------------------------------------------------  RADIOLOGY:  No results found.     Thank  you for the consultation and for allowing [facility] to assist in the care of your  patient. Our recommendations are noted above.  Please contact us if we can be of further service.   Marda Stalker, MD.  Board Certified in Internal Medicine, Pulmonary Medicine, [place], and Sleep Medicine.  [facility] Office Number: [phone]  Patricia Pesa, M.D.  Merton Border, M.D  07/07/2017

## 2017-07-12 ENCOUNTER — Ambulatory Visit (INDEPENDENT_AMBULATORY_CARE_PROVIDER_SITE_OTHER): Payer: Medicare Other | Admitting: Internal Medicine

## 2017-07-12 ENCOUNTER — Encounter: Payer: Self-pay | Admitting: Internal Medicine

## 2017-07-12 VITALS — BP 130/80 | HR 71 | Resp 16 | Ht 70.0 in | Wt 202.0 lb

## 2017-07-12 DIAGNOSIS — G4733 Obstructive sleep apnea (adult) (pediatric): Secondary | ICD-10-CM | POA: Diagnosis not present

## 2017-07-12 DIAGNOSIS — J449 Chronic obstructive pulmonary disease, unspecified: Secondary | ICD-10-CM | POA: Diagnosis not present

## 2017-07-12 NOTE — Patient Instructions (Signed)
--  The best thing that you can do for your lungs is to not smoke again, continue your medications, and increase your physical activity level.   --Try to do 20 min of walking at least 3 times per week.

## 2017-07-15 ENCOUNTER — Ambulatory Visit (INDEPENDENT_AMBULATORY_CARE_PROVIDER_SITE_OTHER): Payer: Medicare Other | Admitting: Family Medicine

## 2017-07-15 VITALS — BP 110/70 | HR 84 | Wt 214.4 lb

## 2017-07-15 DIAGNOSIS — E119 Type 2 diabetes mellitus without complications: Secondary | ICD-10-CM | POA: Diagnosis not present

## 2017-07-15 DIAGNOSIS — Z23 Encounter for immunization: Secondary | ICD-10-CM | POA: Diagnosis not present

## 2017-07-15 DIAGNOSIS — M542 Cervicalgia: Secondary | ICD-10-CM | POA: Diagnosis not present

## 2017-07-15 DIAGNOSIS — F419 Anxiety disorder, unspecified: Secondary | ICD-10-CM

## 2017-07-15 DIAGNOSIS — G8929 Other chronic pain: Secondary | ICD-10-CM

## 2017-07-15 LAB — POCT GLYCOSYLATED HEMOGLOBIN (HGB A1C): HEMOGLOBIN A1C: 5.8

## 2017-07-15 MED ORDER — METFORMIN HCL ER 500 MG PO TB24: 500.0000 mg | ORAL_TABLET | Freq: Two times a day (BID) | ORAL | 0 refills | Status: DC

## 2017-07-15 MED ORDER — OXYCODONE-ACETAMINOPHEN 10-325 MG PO TABS: 1.0000 | ORAL_TABLET | Freq: Four times a day (QID) | ORAL | 0 refills | Status: DC | PRN

## 2017-07-15 MED ORDER — CLONAZEPAM 1 MG PO TABS: 1.0000 mg | ORAL_TABLET | Freq: Three times a day (TID) | ORAL | 0 refills | Status: DC | PRN

## 2017-07-15 NOTE — Progress Notes (Signed)
Name: Juan Le.   MRN: [id number]    DOB: [birth year]   Date:07/15/2017       Progress Note  Subjective  Chief Complaint  Chief Complaint  Patient presents with  . Pain    Neck Pain   This is a chronic problem. The problem occurs constantly. The problem has been unchanged. The pain is present in the midline. The quality of the pain is described as aching. The pain is at a severity of 2/10 (pain is managebale as long as he is on the medication.). The symptoms are aggravated by bending and position. Pertinent negatives include no chest pain or leg pain. He has tried oral narcotics for the symptoms.  Anxiety  Presents for follow-up visit. The problem has been unchanged. Symptoms include insomnia and nervous/anxious behavior. Patient reports no chest pain, depressed mood, excessive worry, muscle tension, panic or shortness of breath. Symptoms occur most days. The severity of symptoms is severe and causing significant distress.   Past treatments include benzodiazephines. The treatment provided significant relief. Compliance with prior treatments has been good.  Diabetes  He presents for his follow-up diabetic visit. He has type 2 diabetes mellitus. His disease course has been stable. Hypoglycemia symptoms include nervousness/anxiousness. Pertinent negatives for diabetes include no chest pain, no foot paresthesias, no polydipsia and no polyuria. Pertinent negatives for diabetic complications include no CVA or heart disease. Current diabetic treatment includes oral agent (monotherapy). He is following a generally healthy diet. He monitors blood glucose at home 3-4 x per week. His breakfast blood glucose range is generally 110-130 mg/dl.      Past Medical History:  Diagnosis Date  . Anxiety   . Chronic, continuous use of opioids   . Degenerative disc disease, lumbar   . Diabetes ([place])   . DJD (degenerative joint disease)   . GERD (gastroesophageal reflux disease)   . History of  diverticulitis   . Hypertension   . Hypertension   . Hypertriglyceridemia   . Hypokalemia   . Insomnia   . Mitral regurgitation   . Vitamin D deficiency   . Wears dentures    full upper    Past Surgical History:  Procedure Laterality Date  . COLONOSCOPY WITH PROPOFOL N/A 01/28/2017   Procedure: COLONOSCOPY WITH PROPOFOL;  Surgeon: [name], MD;  Location: [place];  Service: Endoscopy;  Laterality: N/A;  diabetic - oral meds  . CORONARY ARTERY BYPASS GRAFT  1997  . CORONARY STENT PLACEMENT  11/2006  . FOOT SURGERY  2003  . POLYPECTOMY N/A 01/28/2017   Procedure: POLYPECTOMY;  Surgeon: [name], MD;  Location: [place];  Service: Endoscopy;  Laterality: N/A;  . UMBILICAL HERNIA REPAIR  04/2012    Family History  Problem Relation Age of Onset  . Throat cancer Father   . Colon cancer Father   . Stroke Brother   . Stroke Mother   . Kidney disease Mother   . Heart disease Mother   . Breast cancer Paternal Aunt     Social History   Social History  . Marital status: Married    Spouse name: N/A  . Number of children: 0  . Years of education: N/A   Occupational History  . Not on file.   Social History Main Topics  . Smoking status: Former Smoker    Types: Cigarettes  . Smokeless tobacco: Never Used     Comment: quit in teens  . Alcohol use No  . Drug use: No  .  Sexual activity: Yes    Partners: Female   Other Topics Concern  . Not on file   Social History Narrative  . No narrative on file     Current Outpatient Prescriptions:  .  aspirin 81 MG tablet, Take 1 tablet by mouth daily., Disp: , Rfl:  .  atorvastatin (LIPITOR) 40 MG tablet, Take 1 tablet (40 mg total) by mouth daily at 6 PM., Disp: 90 tablet, Rfl: 0 .  clonazePAM (KLONOPIN) 1 MG tablet, Take 1 tablet (1 mg total) by mouth 3 (three) times daily as needed for anxiety., Disp: 90 tablet, Rfl: 0 .  cloNIDine (CATAPRES) 0.1 MG tablet, Take 1 tablet (0.1 mg total) by mouth 2  (two) times daily., Disp: 180 tablet, Rfl: 0 .  glucose blood test strip, Use as instructed, Disp: 50 each, Rfl: 12 .  metFORMIN (GLUCOPHAGE-XR) 500 MG 24 hr tablet, Take 1 tablet (500 mg total) by mouth 2 (two) times daily., Disp: 180 tablet, Rfl: 0 .  Omega-3 Fatty Acids (FISH OIL) 1000 MG CAPS, Take 2 capsules by mouth daily., Disp: , Rfl:  .  omeprazole (PRILOSEC) 40 MG capsule, Take 1 capsule (40 mg total) by mouth daily., Disp: 90 capsule, Rfl: 0 .  oxyCODONE-acetaminophen (PERCOCET) 10-325 MG tablet, Take 1 tablet by mouth every 6 (six) hours as needed. Pt. Takes 5 tabs/ day, Disp: 155 tablet, Rfl: 0 .  telmisartan (MICARDIS) 40 MG tablet, Take 1 tablet (40 mg total) by mouth daily., Disp: 90 tablet, Rfl: 0 .  traZODone (DESYREL) 50 MG tablet, Take 0.5-1 tablets (25-50 mg total) by mouth at bedtime as needed for sleep., Disp: 30 tablet, Rfl: 2 .  umeclidinium-vilanterol (ANORO ELLIPTA) 62.5-25 MCG/INH AEPB, Inhale 1 puff into the lungs daily., Disp: 1 each, Rfl: 0  Allergies  Allergen Reactions  . Ace Inhibitors Cough  . Diphenhydramine     stomach upset     Review of Systems  Respiratory: Negative for shortness of breath.   Cardiovascular: Negative for chest pain.  Endo/Heme/Allergies: Negative for polydipsia.  Psychiatric/Behavioral: The patient is nervous/anxious and has insomnia.       Objective  Vitals:   07/15/17 0815  BP: 110/70  Pulse: 84  SpO2: 97%  Weight: 214 lb 6.4 oz (97.3 kg)    Physical Exam  Constitutional: He is oriented to person, place, and time and well-developed, well-nourished, and in no distress.  Cardiovascular: Normal rate, regular rhythm and normal heart sounds.   No murmur heard. Pulmonary/Chest: Effort normal and breath sounds normal. He has no wheezes.  Musculoskeletal:       Cervical back: He exhibits tenderness, pain and spasm.       Back:  Neurological: He is alert and oriented to person, place, and time.  Psychiatric: Mood,  memory, affect and judgment normal.  Nursing note and vitals reviewed.       Assessment & Plan  1. Type 2 diabetes mellitus without complication, without long-term current use of insulin (HCC)  point-of-care A1c is 5.8%, well-controlled diabetes  - POCT HgB A1C - metFORMIN (GLUCOPHAGE-XR) 500 MG 24 hr tablet; Take 1 tablet (500 mg total) by mouth 2 (two) times daily.  Dispense: 180 tablet; Refill: 0  2. Anxiety Symptoms of anxiety are stable and responsive to clonazepam taken up to 3 times daily as needed, patient compliant with controlled substances agreement and understands the dependence potential, side effects and drug interactions of benzodiazepines - clonazePAM (KLONOPIN) 1 MG tablet; Take 1 tablet (1 mg total)  by mouth 3 (three) times daily as needed for anxiety.  Dispense: 90 tablet; Refill: 0  3. Flu vaccine need  - Flu vaccine HIGH DOSE PF  4. Chronic neck pain Responsive to Percocet taken every 6 hours as needed, patient compliant with controlled substances agreement, refills provided - oxyCODONE-acetaminophen (PERCOCET) 10-325 MG tablet; Take 1 tablet by mouth every 6 (six) hours as needed. Pt. Takes 5 tabs/ day  Dispense: 155 tablet; Refill: 0   Nyzaiah Kai Asad A. [facility] 07/15/2017 8:51 AM

## 2017-07-18 ENCOUNTER — Other Ambulatory Visit: Payer: Self-pay | Admitting: Family Medicine

## 2017-07-18 DIAGNOSIS — E785 Hyperlipidemia, unspecified: Secondary | ICD-10-CM

## 2017-07-18 MED ORDER — ATORVASTATIN CALCIUM 40 MG PO TABS: 40.0000 mg | ORAL_TABLET | Freq: Every day | ORAL | 0 refills | Status: DC

## 2017-07-18 NOTE — Telephone Encounter (Signed)
PT SAID THAT HE IS OUT OF HIS CHOLESTEROL MEDICATION ATORVASTATIN. HE FORGOT TO TELL THE DR AT HIS VISIT LAST Friday. JUST FORGOT THIS ONE. HE IS TOTALLY OUT. PHARM IS [address] IN [place].

## 2017-08-11 ENCOUNTER — Ambulatory Visit (INDEPENDENT_AMBULATORY_CARE_PROVIDER_SITE_OTHER): Payer: Medicare Other | Admitting: Family Medicine

## 2017-08-11 ENCOUNTER — Encounter: Payer: Self-pay | Admitting: Family Medicine

## 2017-08-11 DIAGNOSIS — K219 Gastro-esophageal reflux disease without esophagitis: Secondary | ICD-10-CM

## 2017-08-11 DIAGNOSIS — I10 Essential (primary) hypertension: Secondary | ICD-10-CM

## 2017-08-11 DIAGNOSIS — G8929 Other chronic pain: Secondary | ICD-10-CM

## 2017-08-11 DIAGNOSIS — M542 Cervicalgia: Secondary | ICD-10-CM | POA: Diagnosis not present

## 2017-08-11 MED ORDER — CLONIDINE HCL 0.1 MG PO TABS: 0.1000 mg | ORAL_TABLET | Freq: Two times a day (BID) | ORAL | 0 refills | Status: DC

## 2017-08-11 MED ORDER — OMEPRAZOLE 40 MG PO CPDR: 40.0000 mg | DELAYED_RELEASE_CAPSULE | Freq: Every day | ORAL | 0 refills | Status: DC

## 2017-08-11 MED ORDER — OXYCODONE-ACETAMINOPHEN 10-325 MG PO TABS: 1.0000 | ORAL_TABLET | Freq: Four times a day (QID) | ORAL | 0 refills | Status: DC | PRN

## 2017-08-11 NOTE — Progress Notes (Signed)
Name: Juan Le.   MRN: [id number]    DOB: [birth year]   Date:08/11/2017       Progress Note  Subjective  Chief Complaint  Chief Complaint  Patient presents with  . Follow-up    1 month   . Medication Refill    clonidine, omeprazole and pain med    Gastroesophageal Reflux  He reports no belching, no chest pain, no globus sensation, no heartburn or no nausea. This is a recurrent problem. The problem occurs frequently. He has tried a PPI for the symptoms. Past procedures do not include an EGD.  Hypertension  This is a chronic problem. The problem is unchanged. The problem is controlled. Associated symptoms include neck pain. Pertinent negatives include no blurred vision, chest pain, headaches, palpitations or shortness of breath. Past treatments include angiotensin blockers and central alpha agonists. Hypertensive end-organ damage includes CAD/MI. There is no history of kidney disease or CVA.  Neck Pain   This is a chronic problem. The problem occurs constantly. The problem has been unchanged. The quality of the pain is described as aching (persistent pain, ). The pain is at a severity of 2/10. The symptoms are aggravated by position and bending. Pertinent negatives include no chest pain or headaches. He has tried oral narcotics for the symptoms. The treatment provided significant relief.  Back Pain  This is a chronic problem. The problem is unchanged. The pain is present in the lumbar spine. The pain is at a severity of 2/10. The pain is moderate. The symptoms are aggravated by bending. Pertinent negatives include no chest pain or headaches.      Past Medical History:  Diagnosis Date  . Anxiety   . Chronic, continuous use of opioids   . Degenerative disc disease, lumbar   . Diabetes ([place])   . DJD (degenerative joint disease)   . GERD (gastroesophageal reflux disease)   . History of diverticulitis   . Hypertension   . Hypertension   . Hypertriglyceridemia   . Hypokalemia    . Insomnia   . Mitral regurgitation   . Vitamin D deficiency   . Wears dentures    full upper    Past Surgical History:  Procedure Laterality Date  . COLONOSCOPY WITH PROPOFOL N/A 01/28/2017   Procedure: COLONOSCOPY WITH PROPOFOL;  Surgeon: [name], MD;  Location: [place];  Service: Endoscopy;  Laterality: N/A;  diabetic - oral meds  . CORONARY ARTERY BYPASS GRAFT  1997  . CORONARY STENT PLACEMENT  11/2006  . FOOT SURGERY  2003  . POLYPECTOMY N/A 01/28/2017   Procedure: POLYPECTOMY;  Surgeon: [name], MD;  Location: [place];  Service: Endoscopy;  Laterality: N/A;  . UMBILICAL HERNIA REPAIR  04/2012    Family History  Problem Relation Age of Onset  . Throat cancer Father   . Colon cancer Father   . Stroke Brother   . Stroke Mother   . Kidney disease Mother   . Heart disease Mother   . Breast cancer Paternal Aunt     Social History   Social History  . Marital status: Married    Spouse name: N/A  . Number of children: 0  . Years of education: N/A   Occupational History  . Not on file.   Social History Main Topics  . Smoking status: Former Smoker    Types: Cigarettes  . Smokeless tobacco: Never Used     Comment: quit in teens  . Alcohol use No  .  Drug use: No  . Sexual activity: Yes    Partners: Female   Other Topics Concern  . Not on file   Social History Narrative  . No narrative on file     Current Outpatient Prescriptions:  .  aspirin 81 MG tablet, Take 1 tablet by mouth daily., Disp: , Rfl:  .  atorvastatin (LIPITOR) 40 MG tablet, Take 1 tablet (40 mg total) by mouth daily at 6 PM., Disp: 90 tablet, Rfl: 0 .  clonazePAM (KLONOPIN) 1 MG tablet, Take 1 tablet (1 mg total) by mouth 3 (three) times daily as needed for anxiety., Disp: 90 tablet, Rfl: 0 .  cloNIDine (CATAPRES) 0.1 MG tablet, Take 1 tablet (0.1 mg total) by mouth 2 (two) times daily., Disp: 180 tablet, Rfl: 0 .  glucose blood test strip, Use as instructed,  Disp: 50 each, Rfl: 12 .  metFORMIN (GLUCOPHAGE-XR) 500 MG 24 hr tablet, Take 1 tablet (500 mg total) by mouth 2 (two) times daily., Disp: 180 tablet, Rfl: 0 .  Omega-3 Fatty Acids (FISH OIL) 1000 MG CAPS, Take 2 capsules by mouth daily., Disp: , Rfl:  .  omeprazole (PRILOSEC) 40 MG capsule, Take 1 capsule (40 mg total) by mouth daily., Disp: 90 capsule, Rfl: 0 .  oxyCODONE-acetaminophen (PERCOCET) 10-325 MG tablet, Take 1 tablet by mouth every 6 (six) hours as needed. Pt. Takes 5 tabs/ day, Disp: 155 tablet, Rfl: 0 .  telmisartan (MICARDIS) 40 MG tablet, Take 1 tablet (40 mg total) by mouth daily., Disp: 90 tablet, Rfl: 0 .  traZODone (DESYREL) 50 MG tablet, Take 0.5-1 tablets (25-50 mg total) by mouth at bedtime as needed for sleep., Disp: 30 tablet, Rfl: 2 .  umeclidinium-vilanterol (ANORO ELLIPTA) 62.5-25 MCG/INH AEPB, Inhale 1 puff into the lungs daily., Disp: 1 each, Rfl: 0  Allergies  Allergen Reactions  . Ace Inhibitors Cough  . Diphenhydramine     stomach upset     Review of Systems  Eyes: Negative for blurred vision.  Respiratory: Negative for shortness of breath.   Cardiovascular: Negative for chest pain and palpitations.  Gastrointestinal: Negative for heartburn and nausea.  Musculoskeletal: Positive for back pain and neck pain.  Neurological: Negative for headaches.     Objective  Vitals:   08/11/17 0906  BP: 114/68  Pulse: 85  Resp: 16  Temp: 98.1 F (36.7 C)  TempSrc: Oral  SpO2: 97%  Weight: 211 lb 12.8 oz (96.1 kg)  Height: 5\' 10"  (1.778 m)    Physical Exam  Constitutional: He is oriented to person, place, and time and well-developed, well-nourished, and in no distress.  HENT:  Head: Normocephalic and atraumatic.  Cardiovascular: Normal rate, regular rhythm and normal heart sounds.   No murmur heard. Pulmonary/Chest: Effort normal and breath sounds normal. He has no wheezes.  Abdominal: Soft. Bowel sounds are normal.  Musculoskeletal: He exhibits no  edema.       Cervical back: He exhibits tenderness, pain and spasm. He exhibits no laceration.       Back:  Neurological: He is alert and oriented to person, place, and time.  Psychiatric: Mood, memory, affect and judgment normal.  Nursing note and vitals reviewed.     Recent Results (from the past 2160 hour(s))  POCT HgB A1C     Status: Abnormal   Collection Time: 07/15/17  8:38 AM  Result Value Ref Range   Hemoglobin A1C 5.8      Assessment & Plan  1. Essential hypertension BP stable on  present hypertensive. - cloNIDine (CATAPRES) 0.1 MG tablet; Take 1 tablet (0.1 mg total) by mouth 2 (two) times daily.  Dispense: 180 tablet; Refill: 0  2. Chronic neck pain Chronic neck pain, stable on opioid treatment, patient compliant with controlled substances agreement. Explained that after my departure from practice, he will have to be referred to pain clinic for follow-up and he verbalized agreement - oxyCODONE-acetaminophen (PERCOCET) 10-325 MG tablet; Take 1 tablet by mouth every 6 (six) hours as needed. Pt. Takes 5 tabs/ day  Dispense: 155 tablet; Refill: 0 - Ambulatory referral to Pain Clinic  3. Gastroesophageal reflux disease, esophagitis presence not specified  - omeprazole (PRILOSEC) 40 MG capsule; Take 1 capsule (40 mg total) by mouth daily.  Dispense: 90 capsule; Refill: 0   Wannetta Langland [facility] 08/11/2017 9:42 AM

## 2017-08-11 NOTE — Progress Notes (Signed)
The following letter was provided to [employer]. during their visit today: ---------------------------------------  Dear valued [facility] Patient,  I am writing to share that as of December 02, 2017, I will no longer be seeing patients at [facility]. While it has been my privilege to care for you as a physician, I have decided to move outside of [place] to pursue other opportunities.  The staff at [facility] has been supportive of my decision and are supportive of any patients who have been under my care.  They will be happy to provide care to you and your family.  The office staff will do everything they can to ensure a seamless transition of care at [facility].  However if you are on any controlled substance medications (i.e. Pain medication or benzodiazepines), they will no longer be able to refill those medications, but we will be more than happy to refer you to a specialist.  [place] center will also assist you with the transfer of medical records should you wish to seek care elsewhere.  If you have any questions about your future care, you may call the office at [phone].  I have enjoyed getting to know my patients here and I wish you the very best.  Sincerely,  Rochel Brome, MD  ---------------------------------------  A written copy of this letter was given to the patient.  The patient verbalizes understanding of the letter, and does request referral to specialist or to new primary care provider.  This decision has been conveyed to Dr. Manuella Ghazi, who will place any appropriate referrals during today's visit.

## 2017-08-30 ENCOUNTER — Ambulatory Visit (INDEPENDENT_AMBULATORY_CARE_PROVIDER_SITE_OTHER): Payer: Medicare Other | Admitting: Family Medicine

## 2017-08-30 ENCOUNTER — Encounter: Payer: Self-pay | Admitting: Family Medicine

## 2017-08-30 VITALS — BP 116/64 | HR 63 | Temp 98.7°F | Resp 16 | Wt 210.3 lb

## 2017-08-30 DIAGNOSIS — I10 Essential (primary) hypertension: Secondary | ICD-10-CM | POA: Diagnosis not present

## 2017-08-30 DIAGNOSIS — E119 Type 2 diabetes mellitus without complications: Secondary | ICD-10-CM

## 2017-08-30 DIAGNOSIS — F419 Anxiety disorder, unspecified: Secondary | ICD-10-CM | POA: Diagnosis not present

## 2017-08-30 LAB — POCT GLYCOSYLATED HEMOGLOBIN (HGB A1C): HEMOGLOBIN A1C: 6.1

## 2017-08-30 MED ORDER — TELMISARTAN 40 MG PO TABS: 40.0000 mg | ORAL_TABLET | Freq: Every day | ORAL | 0 refills | Status: DC

## 2017-08-30 MED ORDER — CLONAZEPAM 1 MG PO TABS: 1.0000 mg | ORAL_TABLET | Freq: Three times a day (TID) | ORAL | 0 refills | Status: DC | PRN

## 2017-08-30 NOTE — Progress Notes (Signed)
Name: Juan Le.   MRN: [id number]    DOB: [birth year]   Date:08/30/2017       Progress Note  Subjective  Chief Complaint  Chief Complaint  Patient presents with  . Medication Refill  . form    excuse from jury duty     Hypertension  This is a chronic problem. The problem is unchanged. The problem is controlled. Associated symptoms include anxiety. Pertinent negatives include no blurred vision, chest pain, headaches or palpitations. Past treatments include angiotensin blockers. Hypertensive end-organ damage includes CAD/MI. There is no history of kidney disease or CVA.  Anxiety  Presents for follow-up visit. Symptoms include excessive worry, insomnia and nervous/anxious behavior. Patient reports no chest pain, depressed mood or palpitations. The severity of symptoms is moderate and causing significant distress.    Pt. brings in a jury duty summons to appear for duty on December 3rd, and wants me to excuse him from jury duty. He has lung disease and reports he cannot sit still for long periods. He has been seen by Pulmonology.    Past Medical History:  Diagnosis Date  . Anxiety   . Chronic, continuous use of opioids   . Degenerative disc disease, lumbar   . Diabetes ([place])   . DJD (degenerative joint disease)   . GERD (gastroesophageal reflux disease)   . History of diverticulitis   . Hypertension   . Hypertension   . Hypertriglyceridemia   . Hypokalemia   . Insomnia   . Mitral regurgitation   . Vitamin D deficiency   . Wears dentures    full upper    Past Surgical History:  Procedure Laterality Date  . CORONARY ARTERY BYPASS GRAFT  1997  . CORONARY STENT PLACEMENT  11/2006  . FOOT SURGERY  2003  . UMBILICAL HERNIA REPAIR  04/2012    Family History  Problem Relation Age of Onset  . Throat cancer Father   . Colon cancer Father   . Stroke Brother   . Stroke Mother   . Kidney disease Mother   . Heart disease Mother   . Breast cancer Paternal Aunt      Social History   Socioeconomic History  . Marital status: Married    Spouse name: Not on file  . Number of children: 0  . Years of education: Not on file  . Highest education level: Not on file  Social Needs  . Financial resource strain: Not on file  . Food insecurity - worry: Not on file  . Food insecurity - inability: Not on file  . Transportation needs - medical: Not on file  . Transportation needs - non-medical: Not on file  Occupational History  . Not on file  Tobacco Use  . Smoking status: Former Smoker    Types: Cigarettes  . Smokeless tobacco: Never Used  . Tobacco comment: quit in teens  Substance and Sexual Activity  . Alcohol use: No    Alcohol/week: 0.0 oz  . Drug use: No  . Sexual activity: Yes    Partners: Female  Other Topics Concern  . Not on file  Social History Narrative  . Not on file     Current Outpatient Medications:  .  aspirin 81 MG tablet, Take 1 tablet by mouth daily., Disp: , Rfl:  .  atorvastatin (LIPITOR) 40 MG tablet, Take 1 tablet (40 mg total) by mouth daily at 6 PM., Disp: 90 tablet, Rfl: 0 .  clonazePAM (KLONOPIN) 1 MG tablet, Take 1 tablet (  1 mg total) by mouth 3 (three) times daily as needed for anxiety., Disp: 90 tablet, Rfl: 0 .  cloNIDine (CATAPRES) 0.1 MG tablet, Take 1 tablet (0.1 mg total) by mouth 2 (two) times daily., Disp: 180 tablet, Rfl: 0 .  glucose blood test strip, Use as instructed, Disp: 50 each, Rfl: 12 .  metFORMIN (GLUCOPHAGE-XR) 500 MG 24 hr tablet, Take 1 tablet (500 mg total) by mouth 2 (two) times daily., Disp: 180 tablet, Rfl: 0 .  Omega-3 Fatty Acids (FISH OIL) 1000 MG CAPS, Take 2 capsules by mouth daily., Disp: , Rfl:  .  omeprazole (PRILOSEC) 40 MG capsule, Take 1 capsule (40 mg total) by mouth daily., Disp: 90 capsule, Rfl: 0 .  oxyCODONE-acetaminophen (PERCOCET) 10-325 MG tablet, Take 1 tablet by mouth every 6 (six) hours as needed. Pt. Takes 5 tabs/ day, Disp: 155 tablet, Rfl: 0 .  telmisartan  (MICARDIS) 40 MG tablet, Take 1 tablet (40 mg total) by mouth daily., Disp: 90 tablet, Rfl: 0 .  traZODone (DESYREL) 50 MG tablet, Take 0.5-1 tablets (25-50 mg total) by mouth at bedtime as needed for sleep., Disp: 30 tablet, Rfl: 2 .  umeclidinium-vilanterol (ANORO ELLIPTA) 62.5-25 MCG/INH AEPB, Inhale 1 puff into the lungs daily., Disp: 1 each, Rfl: 0  Allergies  Allergen Reactions  . Ace Inhibitors Cough  . Diphenhydramine     stomach upset     Review of Systems  Eyes: Negative for blurred vision.  Cardiovascular: Negative for chest pain and palpitations.  Neurological: Negative for headaches.  Psychiatric/Behavioral: The patient is nervous/anxious and has insomnia.     Objective  Vitals:   08/30/17 1342  BP: 116/64  Pulse: 63  Resp: 16  Temp: 98.7 F (37.1 C)  TempSrc: Oral  SpO2: 98%  Weight: 210 lb 4.8 oz (95.4 kg)    Physical Exam  Constitutional: He is oriented to person, place, and time and well-developed, well-nourished, and in no distress.  HENT:  Head: Normocephalic and atraumatic.  Cardiovascular: Normal rate, regular rhythm and normal heart sounds.  No murmur heard. Pulmonary/Chest: Effort normal and breath sounds normal. He has no wheezes.  Neurological: He is alert and oriented to person, place, and time.  Psychiatric: Mood, memory, affect and judgment normal.  Nursing note and vitals reviewed.   Recent Results (from the past 2160 hour(s))  POCT HgB A1C     Status: Abnormal   Collection Time: 07/15/17  8:38 AM  Result Value Ref Range   Hemoglobin A1C 5.8   POCT HgB A1C     Status: Normal   Collection Time: 08/30/17  1:48 PM  Result Value Ref Range   Hemoglobin A1C 6.1      Assessment & Plan  1. Type 2 diabetes mellitus without complication, without long-term current use of insulin (HCC) POC A1c was obtained at the request of patient by my CMA, explained that the minumum duration between A1c readings should be at least 90 days.  - POCT HgB  A1C  2. Anxiety Apprised of my  departure from practice and that he should see a new PCP or psychiatrist for management of anxiety - clonazePAM (KLONOPIN) 1 MG tablet; Take 1 tablet (1 mg total) 3 (three) times daily as needed by mouth for anxiety.  Dispense: 90 tablet; Refill: 0  3. Essential hypertension  - telmisartan (MICARDIS) 40 MG tablet; Take 1 tablet (40 mg total) daily by mouth.  Dispense: 90 tablet; Refill: 0   Duwan Adrian [facility] 08/30/2017 2:07 PM

## 2017-09-07 ENCOUNTER — Other Ambulatory Visit: Payer: Self-pay | Admitting: Family Medicine

## 2017-09-07 DIAGNOSIS — M542 Cervicalgia: Principal | ICD-10-CM

## 2017-09-07 DIAGNOSIS — G8929 Other chronic pain: Secondary | ICD-10-CM

## 2017-09-07 MED ORDER — OXYCODONE-ACETAMINOPHEN 10-325 MG PO TABS: 1.0000 | ORAL_TABLET | Freq: Four times a day (QID) | ORAL | 0 refills | Status: DC | PRN

## 2017-09-07 NOTE — Telephone Encounter (Signed)
Medication refill for Percocet.

## 2017-09-07 NOTE — Telephone Encounter (Signed)
Copied from [place] #[id number]. Topic: General - Other >> Sep 07, 2017  9:00 AM Oneta Rack wrote: Relation to pt: self Call back number: [phone]  Pharmacy: [place], [place] [phone] (Phone) [phone] (Fax)   Reason for call:  Patient checking on the status of medication refill oxyCODONE-acetaminophen (PERCOCET) 10-325 MG tablet, patient would like to pick up Rx today, please advise

## 2017-09-07 NOTE — Telephone Encounter (Signed)
Apt schedule with pain clinic on 09/13/2017.

## 2017-09-12 ENCOUNTER — Ambulatory Visit: Payer: Medicare Other | Admitting: Nurse Practitioner

## 2017-09-12 NOTE — Telephone Encounter (Signed)
RX signed on 09/07/2017 and given to [facility] to put up front

## 2017-09-13 ENCOUNTER — Ambulatory Visit: Payer: Medicare Other | Admitting: Student in an Organized Health Care Education/Training Program

## 2017-09-26 ENCOUNTER — Ambulatory Visit: Payer: Medicare Other | Admitting: Nurse Practitioner

## 2017-09-27 ENCOUNTER — Ambulatory Visit
Payer: Medicare Other | Attending: Student in an Organized Health Care Education/Training Program | Admitting: Student in an Organized Health Care Education/Training Program

## 2017-09-28 ENCOUNTER — Ambulatory Visit: Payer: Medicare Other | Admitting: Nurse Practitioner

## 2017-10-03 ENCOUNTER — Ambulatory Visit (INDEPENDENT_AMBULATORY_CARE_PROVIDER_SITE_OTHER): Payer: Medicare Other | Admitting: Nurse Practitioner

## 2017-10-03 ENCOUNTER — Other Ambulatory Visit: Payer: Self-pay

## 2017-10-03 ENCOUNTER — Other Ambulatory Visit: Payer: Self-pay | Admitting: Nurse Practitioner

## 2017-10-03 ENCOUNTER — Encounter: Payer: Self-pay | Admitting: Nurse Practitioner

## 2017-10-03 VITALS — BP 114/82 | HR 68 | Temp 97.7°F | Resp 16 | Ht 70.0 in | Wt 202.2 lb

## 2017-10-03 DIAGNOSIS — E119 Type 2 diabetes mellitus without complications: Secondary | ICD-10-CM

## 2017-10-03 DIAGNOSIS — Z7689 Persons encountering health services in other specified circumstances: Secondary | ICD-10-CM | POA: Diagnosis not present

## 2017-10-03 DIAGNOSIS — M542 Cervicalgia: Secondary | ICD-10-CM | POA: Diagnosis not present

## 2017-10-03 DIAGNOSIS — M549 Dorsalgia, unspecified: Secondary | ICD-10-CM

## 2017-10-03 DIAGNOSIS — F419 Anxiety disorder, unspecified: Secondary | ICD-10-CM

## 2017-10-03 DIAGNOSIS — G8929 Other chronic pain: Secondary | ICD-10-CM

## 2017-10-03 DIAGNOSIS — K219 Gastro-esophageal reflux disease without esophagitis: Secondary | ICD-10-CM

## 2017-10-03 DIAGNOSIS — F1120 Opioid dependence, uncomplicated: Secondary | ICD-10-CM

## 2017-10-03 DIAGNOSIS — R0602 Shortness of breath: Secondary | ICD-10-CM

## 2017-10-03 DIAGNOSIS — F99 Mental disorder, not otherwise specified: Secondary | ICD-10-CM

## 2017-10-03 DIAGNOSIS — F5105 Insomnia due to other mental disorder: Secondary | ICD-10-CM

## 2017-10-03 DIAGNOSIS — E785 Hyperlipidemia, unspecified: Secondary | ICD-10-CM

## 2017-10-03 DIAGNOSIS — I10 Essential (primary) hypertension: Secondary | ICD-10-CM

## 2017-10-03 MED ORDER — ATORVASTATIN CALCIUM 40 MG PO TABS: 40.0000 mg | ORAL_TABLET | Freq: Every day | ORAL | 1 refills | Status: DC

## 2017-10-03 MED ORDER — METFORMIN HCL ER 500 MG PO TB24: 500.0000 mg | ORAL_TABLET | Freq: Two times a day (BID) | ORAL | 1 refills | Status: DC

## 2017-10-03 MED ORDER — OMEPRAZOLE 40 MG PO CPDR: 40.0000 mg | DELAYED_RELEASE_CAPSULE | Freq: Every day | ORAL | 1 refills | Status: DC

## 2017-10-03 MED ORDER — MELOXICAM 15 MG PO TABS: 15.0000 mg | ORAL_TABLET | Freq: Every day | ORAL | 0 refills | Status: DC

## 2017-10-03 MED ORDER — CLONAZEPAM 1 MG PO TABS: 1.0000 mg | ORAL_TABLET | Freq: Three times a day (TID) | ORAL | 0 refills | Status: DC | PRN

## 2017-10-03 MED ORDER — TRAZODONE HCL 50 MG PO TABS: 25.0000 mg | ORAL_TABLET | Freq: Every evening | ORAL | 1 refills | Status: DC | PRN

## 2017-10-03 MED ORDER — TELMISARTAN 40 MG PO TABS: 40.0000 mg | ORAL_TABLET | Freq: Every day | ORAL | 1 refills | Status: DC

## 2017-10-03 MED ORDER — BACLOFEN 10 MG PO TABS: 10.0000 mg | ORAL_TABLET | Freq: Three times a day (TID) | ORAL | 0 refills | Status: DC

## 2017-10-03 NOTE — Patient Instructions (Signed)
Juan Le, Thank you for coming in to clinic today.  1. For your opioids: - You are at risk of opioid withdrawal since your last dose of oxycodone was 7 days ago.   - Continue your clonazepam without decreasing today. - If you have sweating, shakiness, nausea, heart racing these are signs of opioid withdrawal that need immediate treatment.  Seek care in ER.  2. START meloxicam 15 mg once daily for pain - START baclofen 10 mg up to three times daily for muscle spasms.  3. No other med changes today.  We will work to decrease clonazepam and change anxiety medicine in a future visit.   Please schedule a follow-up appointment with Cassell Smiles, AGNP. Return in about 4 weeks (around 10/31/2017) for anxiety and diabetes.  If you have any other questions or concerns, please feel free to call the clinic or send a message through [place]. You may also schedule an earlier appointment if necessary.  You will receive a survey after today's visit either digitally by e-mail or paper by [employer]. Your experiences and feedback matter to [place].  Please respond so we know how we are doing as we provide care for you.   Cassell Smiles, DNP, AGNP-BC Adult Gerontology Nurse Practitioner [place]

## 2017-10-03 NOTE — Progress Notes (Signed)
Subjective:    Patient ID: Juan Gist., male    DOB: August 16, 1946, 71 y.o.   MRN: [id number]  Juan Le. is a 71 y.o. male presenting on 10/03/2017 for Establish Care (transition care from Dr. Manuella Le )   HPI [place] Provider Pt last seen by PCP Dr. Manuella Le at [facility] several months ago.  Obtain records from [facility]where.   COPD/Shortness of breath:  Occupational exposures: Pt worked in [employer] less than 1 year.  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  No history of cigarette smoking. - Takes anoro ellipta daily and breathes well now.  Has seen pulmonology Juan Le in September 2018.  Anxiety Takes clonazepam 3 mg daily - 1 in am, 1 evening, 1 bedtime.  Helps him to sleep.  Also takes trazodone 50 mg once at bedtime.  Clonazepam and trazodone are his only medications for anxiety.     Chronic back pain: Pt has long history of back pain and has been previously treated only with oxycodone.  Has been out of oxycodone for last 7 days and did not taper his dosing. He has not yet had any withdrawal symptoms.  Pt states his pain is from degenerative vertebrae cervical spine, but he also has thoracic back pain.   - Has previously had interventional pain management, but does not desire any additional interventional treatments or PT.  Has also considered and declined neurosurgery.  Past Medical History:  Diagnosis Date  . Anxiety   . Atelectasis of left lung 03/25/2017  . Chronic, continuous use of opioids   . Degenerative disc disease, lumbar   . Diabetes ([place])   . DJD (degenerative joint disease)   . GERD (gastroesophageal reflux disease)   . Heart attack ([place])   . History of diverticulitis   . Hypertension   . Hypertension   . Hypertriglyceridemia   . Hypokalemia   . Insomnia   . Mitral regurgitation   . Sleep apnea   . Vitamin D deficiency   . Wears dentures    full upper   Past Surgical History:  Procedure  Laterality Date  . COLONOSCOPY WITH PROPOFOL N/A 01/28/2017   Procedure: COLONOSCOPY WITH PROPOFOL;  Surgeon: [name], MD;  Location: [facility];  Service: Endoscopy;  Laterality: N/A;  diabetic - oral meds  . CORONARY ARTERY BYPASS GRAFT  1997  . CORONARY STENT PLACEMENT  11/2006  . FOOT SURGERY  2003  . HERNIA REPAIR    . POLYPECTOMY N/A 01/28/2017   Procedure: POLYPECTOMY;  Surgeon: [name], MD;  Location: [facility];  Service: Endoscopy;  Laterality: N/A;  . UMBILICAL HERNIA REPAIR  04/2012   Social History   Socioeconomic History  . Marital status: Married    Spouse name: Not on file  . Number of children: 0  . Years of education: Not on file  . Highest education level: Not on file  Social Needs  . Financial resource strain: Not on file  . Food insecurity - worry: Not on file  . Food insecurity - inability: Not on file  . Transportation needs - medical: Not on file  . Transportation needs - non-medical: Not on file  Occupational History  . Not on file  Tobacco Use  . Smoking status: Former Smoker    Types: Cigarettes  . Smokeless tobacco: Never Used  . Tobacco comment: quit in teens  Substance and Sexual Activity  . Alcohol use: No  Alcohol/week: 0.0 oz  . Drug use: No  . Sexual activity: Yes    Partners: Female  Other Topics Concern  . Not on file  Social History Narrative  . Not on file   Family History  Problem Relation Age of Onset  . Throat cancer Father   . Colon cancer Father   . Heart disease Father   . Cancer Father   . Depression Father   . Stroke Brother   . Stroke Mother   . Kidney disease Mother   . Heart disease Mother   . Breast cancer Paternal Aunt    Current Outpatient Medications on File Prior to Visit  Medication Sig  . aspirin 81 MG tablet Take 1 tablet by mouth daily.  . cloNIDine (CATAPRES) 0.1 MG tablet Take 1 tablet (0.1 mg total) by mouth 2 (two) times daily.  Marland Kitchen glucose blood test strip Use as  instructed  . Omega-3 Fatty Acids (FISH OIL) 1000 MG CAPS Take 2 capsules by mouth daily.  Marland Kitchen umeclidinium-vilanterol (ANORO ELLIPTA) 62.5-25 MCG/INH AEPB Inhale 1 puff into the lungs daily.  Marland Kitchen oxyCODONE-acetaminophen (PERCOCET) 10-325 MG tablet Take 1 tablet by mouth every 6 (six) hours as needed. Pt. Takes 5 tabs/ day (Patient not taking: Reported on 10/03/2017)   No current facility-administered medications on file prior to visit.     Review of Systems  Constitutional: Negative.   HENT: Negative.   Eyes: Negative.   Respiratory: Positive for cough and shortness of breath.   Cardiovascular: Negative.   Gastrointestinal: Negative.   Endocrine: Negative.   Genitourinary: Negative.   Musculoskeletal: Positive for back pain, myalgias and neck pain.  Skin: Negative.   Allergic/Immunologic: Negative.   Neurological: Negative.   Hematological: Negative.   Psychiatric/Behavioral: Positive for sleep disturbance. Negative for self-injury and suicidal ideas. The patient is nervous/anxious.    Per HPI unless specifically indicated above.     Objective:    BP 114/82 (BP Location: Right Arm, Patient Position: Sitting, Cuff Size: Normal)   Pulse 68   Temp 97.7 F (36.5 C) (Oral)   Resp 16   Ht 5\' 10"  (1.778 m)   Wt 202 lb 3.2 oz (91.7 kg)   SpO2 100%   BMI 29.01 kg/m    Wt Readings from Last 3 Encounters:  10/03/17 202 lb 3.2 oz (91.7 kg)  08/30/17 210 lb 4.8 oz (95.4 kg)  08/11/17 211 lb 12.8 oz (96.1 kg)    Physical Exam  General - obese, well-appearing, NAD HEENT - Normocephalic, atraumatic Neck - supple, non-tender, no LAD, no thyromegaly, no carotid bruit Heart - RRR, no murmurs heard Lungs - Clear throughout all lobes, no wheezing, crackles, or rhonchi. Normal work of breathing. Musculoskeletal -  C-Spine/T-Spine Inspection: Normal appearance, Large body habitus, no spinal deformity, symmetrical. Palpation: No tenderness over spinous processes. Bilateral cervical  paraspinal muscles tender and with hypertonicity/spasm.  Bilateral trapezius muscles tender and with hypertonicity/spasm.  Thoracic spinal muscles tender and with hypertonicity/spasm. ROM: Limited active ROM forward flex / neck extension, rotation L/R without discomfort, Lateral bending L/R with mild discomfort Special Testing: Spurling's test negative for pain, pincer grasp normal strength Strength: Bilateral hip flex/ext 5/5, knee flex/ext 5/5, ankle dorsiflex/plantarflex 5/5 Neurovascular: intact distal sensation to light touch Extremeties - non-tender, no edema, cap refill < 2 seconds, peripheral pulses intact +2 bilaterally Skin - warm, dry Neuro - awake, alert, oriented x3, normal gait Psych - Normal mood and affect, normal behavior    Results for orders placed  or performed in visit on 08/30/17  POCT HgB A1C  Result Value Ref Range   Hemoglobin A1C 6.1       Assessment & Plan:   Problem List Items Addressed This Visit      Other   Anxiety    Currently uncontrolled anxiety with depression.  GAD7=11 and PHQ9=13.  Pt not currently on adequate medical therapy for mood disorders.  Is only using clonazepam 3 mg daily (divided doses) and trazodone 50 mg at night. Pt declines changing therapy at this time.  Plan: 1. Continue clonazepam without dose reduction for the next 2-3 weeks to assist with potential for opioid withdrawal.  Discussed w/ pt need to reduce use of clonazepam to 2 mg total daily dose at future appointment for safe medical therapy. 2. Consider adding SSRI for management of symptoms in place of clonazepam in future.  Pt does not report having any panic attacks, so use of clonazepam is not optimal. 3. May need dose increase of trazodone in future if clonazepam is discontinued or reduced.  Continue trazodone 50 mg once daily. 4. Followup 4 weeks.      Relevant Medications   clonazePAM (KLONOPIN) 1 MG tablet   Continuous opioid dependence ([place])    Pt has had continuous  opioid use until 7 days ago.  Pt at risk of withdrawal, but is not currently exhibiting symptoms.  Discussed w/ pt that opioids will not be continued.    Plan: 1. Discussed s/sx of withdrawal.  Discussed use of benzodiazepines to prevent withdrawal, so no change in clonazepam dose at this time.  Consider reducing in about 2-3 weeks and at next visit. 2. START baclofen 10 mg tid prn muscle spasm for pain control.  Consider addition of gabapentin in future. 3. Followup 4 weeks.      Chronic neck and back pain - Primary    Pt currently in moderate level of pain with hypertonicity of muscles.  No current use of opioids as has been previously used for pain management (see AP opioid dependence).  Plan: 1. Optimize therapy for muscle spasms.  Consider gabapentin in future for radiculopathy of lower spine.  Also consider PT. 2. Followup 4 weeks.      Relevant Medications   meloxicam (MOBIC) 15 MG tablet   clonazePAM (KLONOPIN) 1 MG tablet   Shortness of breath on exertion    Pt with current control of shortness of breath symptoms.  Has had new start of maintenance inhaler and would like to continue since it has helped symptoms. Possibility for occupational exposure and mild COPD.  Plan: 1. Continue anoro ellipta once daily.   2. Consider PFT for assessment if needed in future. 3. Followup as needed and w/ Dr. Ashby Dawes.       Other Visit Diagnoses    Encounter to establish care       Pt was last seen by Dr. Manuella Le at [facility].  records reviewed today.  Medical history reviewed w/ pt today.      Meds ordered this encounter  Medications  . meloxicam (MOBIC) 15 MG tablet    Sig: Take 1 tablet (15 mg total) by mouth daily.    Dispense:  30 tablet    Refill:  0    Order Specific Question:   Supervising Provider    Answer:   Olin Hauser [[id number]]  . DISCONTD: baclofen (LIORESAL) 10 MG tablet    Sig: Take 1 tablet (10 mg total) by mouth 3 (three) times  daily.      Dispense:  30 each    Refill:  0    Order Specific Question:   Supervising Provider    Answer:   Olin Hauser [[id number]]  . clonazePAM (KLONOPIN) 1 MG tablet    Sig: Take 1 tablet (1 mg total) by mouth 3 (three) times daily as needed for anxiety.    Dispense:  90 tablet    Refill:  0    Order Specific Question:   Supervising Provider    Answer:   Olin Hauser [[id number]]      Follow up plan: Return in about 4 weeks (around 10/31/2017) for anxiety and diabetes.  Cassell Smiles, DNP, AGPCNP-BC Adult Gerontology Primary Care Nurse Practitioner [facility] 10/16/2017, 12:05 PM

## 2017-10-04 ENCOUNTER — Telehealth: Payer: Self-pay | Admitting: Nurse Practitioner

## 2017-10-04 DIAGNOSIS — M542 Cervicalgia: Secondary | ICD-10-CM

## 2017-10-04 DIAGNOSIS — M549 Dorsalgia, unspecified: Principal | ICD-10-CM

## 2017-10-04 DIAGNOSIS — G8929 Other chronic pain: Principal | ICD-10-CM

## 2017-10-04 MED ORDER — BACLOFEN 10 MG PO TABS: 10.0000 mg | ORAL_TABLET | Freq: Three times a day (TID) | ORAL | 0 refills | Status: DC

## 2017-10-04 NOTE — Telephone Encounter (Signed)
Pt said he was given a 10 day supply of muscle relaxers.  He asked for another 20 days worth until next scheduled appt [phone]

## 2017-10-16 ENCOUNTER — Encounter: Payer: Self-pay | Admitting: Nurse Practitioner

## 2017-10-16 NOTE — Assessment & Plan Note (Addendum)
Currently uncontrolled anxiety with depression.  GAD7=11 and PHQ9=13.  Pt not currently on adequate medical therapy for mood disorders.  Is only using clonazepam 3 mg daily (divided doses) and trazodone 50 mg at night. Pt declines changing therapy at this time.  Plan: 1. Continue clonazepam without dose reduction for the next 2-3 weeks to assist with potential for opioid withdrawal.  Discussed w/ pt need to reduce use of clonazepam to 2 mg total daily dose at future appointment for safe medical therapy. 2. Consider adding SSRI for management of symptoms in place of clonazepam in future.  Pt does not report having any panic attacks, so use of clonazepam is not optimal. 3. May need dose increase of trazodone in future if clonazepam is discontinued or reduced.  Continue trazodone 50 mg once daily. 4. Followup 4 weeks.

## 2017-10-16 NOTE — Assessment & Plan Note (Signed)
Pt has had continuous opioid use until 7 days ago.  Pt at risk of withdrawal, but is not currently exhibiting symptoms.  Discussed w/ pt that opioids will not be continued.    Plan: 1. Discussed s/sx of withdrawal.  Discussed use of benzodiazepines to prevent withdrawal, so no change in clonazepam dose at this time.  Consider reducing in about 2-3 weeks and at next visit. 2. START baclofen 10 mg tid prn muscle spasm for pain control.  Consider addition of gabapentin in future. 3. Followup 4 weeks.

## 2017-10-16 NOTE — Assessment & Plan Note (Addendum)
Pt currently in moderate level of pain with hypertonicity of muscles.  No current use of opioids as has been previously used for pain management (see AP opioid dependence).  Plan: 1. Optimize therapy for muscle spasms.  Consider gabapentin in future for radiculopathy of lower spine.  Also consider PT. 2. START meloxicam 15 mg once daily. 3. Followup 4 weeks.

## 2017-10-16 NOTE — Assessment & Plan Note (Addendum)
Pt with current control of shortness of breath symptoms.  Has had new start of maintenance inhaler and would like to continue since it has helped symptoms. Possibility for occupational exposure and mild COPD.  Plan: 1. Continue anoro ellipta once daily.   2. Consider PFT for assessment if needed in future. 3. Followup as needed and w/ Dr. Ashby Dawes.

## 2017-10-31 ENCOUNTER — Telehealth: Payer: Self-pay | Admitting: Nurse Practitioner

## 2017-10-31 NOTE — Telephone Encounter (Signed)
Pt had called RN triage line on 10/29/17 with complaints of pain from shingles.  - Pt reported for initial treatment to Urgent Care clinic on [address] in [place], [place]  Pt was prescribed valacyclovir 1g tid x 7 days.   For pain, they provided naproxen bid with food and gabapentin 300 mg tablet (2 tabs bid).  Gabapentin provides some relief. - Pt then called back later with severe pain and was provided Percocet 5-325mg  #20 tabs.  Pt reports this has not helped his pain.  Pt scheduled for appointment 1/16 and will continue to evaluate and treat at that time. Consider higher dose gabapentin since pt reports no side effects or change to Lyrica.  Pt verbalizes understanding of presence of pain.  Will wait for additional treatment until office visit in 11/02/17

## 2017-11-02 ENCOUNTER — Ambulatory Visit (INDEPENDENT_AMBULATORY_CARE_PROVIDER_SITE_OTHER): Payer: Medicare Other | Admitting: Nurse Practitioner

## 2017-11-02 ENCOUNTER — Encounter: Payer: Self-pay | Admitting: Nurse Practitioner

## 2017-11-02 ENCOUNTER — Other Ambulatory Visit: Payer: Self-pay

## 2017-11-02 VITALS — BP 108/68 | HR 68 | Temp 98.0°F | Ht 70.0 in | Wt 207.8 lb

## 2017-11-02 DIAGNOSIS — F5105 Insomnia due to other mental disorder: Secondary | ICD-10-CM

## 2017-11-02 DIAGNOSIS — I10 Essential (primary) hypertension: Secondary | ICD-10-CM

## 2017-11-02 DIAGNOSIS — M792 Neuralgia and neuritis, unspecified: Secondary | ICD-10-CM

## 2017-11-02 DIAGNOSIS — G8929 Other chronic pain: Secondary | ICD-10-CM | POA: Diagnosis not present

## 2017-11-02 DIAGNOSIS — M549 Dorsalgia, unspecified: Secondary | ICD-10-CM

## 2017-11-02 DIAGNOSIS — M542 Cervicalgia: Secondary | ICD-10-CM

## 2017-11-02 DIAGNOSIS — E119 Type 2 diabetes mellitus without complications: Secondary | ICD-10-CM | POA: Diagnosis not present

## 2017-11-02 DIAGNOSIS — F419 Anxiety disorder, unspecified: Secondary | ICD-10-CM | POA: Diagnosis not present

## 2017-11-02 DIAGNOSIS — K219 Gastro-esophageal reflux disease without esophagitis: Secondary | ICD-10-CM

## 2017-11-02 DIAGNOSIS — B029 Zoster without complications: Secondary | ICD-10-CM

## 2017-11-02 DIAGNOSIS — F99 Mental disorder, not otherwise specified: Secondary | ICD-10-CM | POA: Diagnosis not present

## 2017-11-02 MED ORDER — CLONIDINE HCL 0.1 MG PO TABS: 0.1000 mg | ORAL_TABLET | Freq: Two times a day (BID) | ORAL | 0 refills | Status: DC

## 2017-11-02 MED ORDER — TRAZODONE HCL 100 MG PO TABS: 100.0000 mg | ORAL_TABLET | Freq: Every evening | ORAL | 2 refills | Status: DC | PRN

## 2017-11-02 MED ORDER — GABAPENTIN 300 MG PO CAPS: 600.0000 mg | ORAL_CAPSULE | Freq: Three times a day (TID) | ORAL | 1 refills | Status: DC

## 2017-11-02 MED ORDER — OMEPRAZOLE 40 MG PO CPDR: 40.0000 mg | DELAYED_RELEASE_CAPSULE | Freq: Every day | ORAL | 1 refills | Status: DC

## 2017-11-02 MED ORDER — CLONAZEPAM 1 MG PO TABS: ORAL_TABLET | ORAL | 0 refills | Status: DC

## 2017-11-02 MED ORDER — BACLOFEN 10 MG PO TABS: 10.0000 mg | ORAL_TABLET | Freq: Four times a day (QID) | ORAL | 1 refills | Status: DC

## 2017-11-02 MED ORDER — METFORMIN HCL ER 500 MG PO TB24: 500.0000 mg | ORAL_TABLET | Freq: Two times a day (BID) | ORAL | 1 refills | Status: DC

## 2017-11-02 NOTE — Progress Notes (Signed)
Subjective:    Patient ID: Juan Le., male    DOB: 1945-12-28, 72 y.o.   MRN: [id number]  Juan Le. is a 72 y.o. male presenting on 11/02/2017 for Anxiety (depression, recently diagnose w/ shingles x 1 week ago )   HPI Shingles Onset 1 week ago. Pt was initially treated at Urgent Care and is still finishing his Valacyclovir course.  Gabapentin is helping some, but not enough.  Currently pain score 5/10, previously 10/10 pain.  Pt reports he currently has scabbed lesions.  He was prescribed several oxycodone for his shingles pain, but reports it did not help his pain so he stopped taking them.  Chronic pain Baclofen is helping some with muscle tightness.  Pt states he has back tightness, but no spasms.  He has been taking baclofen 20 mg twice daily instead of 10 mg tid as requested without calling for advice between visits.  Pt states he has stopped taking all oxycodone, had high anxiety and shakiness.    Anxiety and Insomnia Currently, pt reports stable anxiety.  He had increased anxiety symptoms as he was stopping oxycodone, but those have resolved and is improving again.   - He has continued with one 1 mg tablet clonazepam tid during the last month without tapering since it was helping him with his oxycodone withdrawal period.  He states he was originally prescribed clonazepam for anxiety, but he also takes it for sleep.  Pt states he is taking 1 mg during day and 2 mg clonazepam at night with trazodone 50 mg for sleep.  He states he had used Ambien for years, but it's efficacy reduced.  He also notes the same is occurring with trazodone.  Reports being able to fall asleep, but awakens after 2-3 hours of sleep.  He then sleeps for about 30 minutes several times through the night.    GAD 7 : Generalized Anxiety Score 10/03/2017  Nervous, Anxious, on Edge 3  Control/stop worrying 2  Worry too much - different things 2  Trouble relaxing 2  Restless 1  Easily annoyed or  irritable 1  Afraid - awful might happen 0  Total GAD 7 Score 11  Anxiety Difficulty Somewhat difficult      Social History   Tobacco Use  . Smoking status: Former Smoker    Types: Cigarettes  . Smokeless tobacco: Never Used  . Tobacco comment: quit in teens  Substance Use Topics  . Alcohol use: No    Alcohol/week: 0.0 oz  . Drug use: No    Review of Systems Per HPI unless specifically indicated above     Objective:    BP 108/68 (BP Location: Right Arm, Patient Position: Sitting, Cuff Size: Normal)   Pulse 68   Temp 98 F (36.7 C) (Oral)   Ht 5\' 10"  (1.778 m)   Wt 207 lb 12.8 oz (94.3 kg)   SpO2 99%   BMI 29.82 kg/m   Wt Readings from Last 3 Encounters:  11/02/17 207 lb 12.8 oz (94.3 kg)  10/03/17 202 lb 3.2 oz (91.7 kg)  08/30/17 210 lb 4.8 oz (95.4 kg)    Physical Exam  Constitutional: He is oriented to person, place, and time. He appears well-developed and well-nourished. No distress.  HENT:  Head: Normocephalic and atraumatic.  Neck: Normal range of motion. Neck supple.  Cardiovascular: Normal rate, regular rhythm, normal heart sounds and intact distal pulses.  Pulmonary/Chest: Effort normal and breath sounds normal. No respiratory distress.  Abdominal: Soft. Bowel sounds are normal.  Musculoskeletal: Normal range of motion.  Neurological: He is alert and oriented to person, place, and time.  Skin: Skin is warm and dry. Rash noted. Rash is pustular.     Psychiatric: He has a normal mood and affect. His behavior is normal. Judgment and thought content normal.  Vitals reviewed.       Results for orders placed or performed in visit on 08/30/17  POCT HgB A1C  Result Value Ref Range   Hemoglobin A1C 6.1       Assessment & Plan:   Problem List Items Addressed This Visit      Cardiovascular and Mediastinum   Hypertension    Pt requests refill of clonidine.  Refill provided.      Relevant Medications   cloNIDine (CATAPRES) 0.1 MG tablet      Digestive   GERD (gastroesophageal reflux disease)    Pt requests refill of omeprazole.  Refill provided.  Review further at future visit.      Relevant Medications   omeprazole (PRILOSEC) 40 MG capsule     Endocrine   Type 2 diabetes mellitus without complications ([place])    Pt requests refill of metformin. Refill provided. Further review at future visit.      Relevant Medications   metFORMIN (GLUCOPHAGE-XR) 500 MG 24 hr tablet     Other   Anxiety - Primary    Currently moderately controlled anxiety with depression, but ongoing symptoms without panic disorder.  Pt not currently on adequate medical therapy for mood disorders.  Is only using clonazepam 3 mg daily (divided doses 1 mg day, 2 mg bedtime) and trazodone 50 mg at night. Pt is now off oxycodone and is willing to work toward changing clonazepam.  Plan: 1. Reduce clonazepam to 1 mg daytime, 1.5 mg at bedtime for 14 days. Then, take 1 mg daytime and 1 mg at bedtime.  Goal is to discontinue clonazepam completely within 3 months. 2. Increase trazodone to 100mg  nightly for SSRI dose increase to aid in anxiety symptoms and insomnia. 3. Encouraged stress management. 4. Followup 4 weeks.      Relevant Medications   traZODone (DESYREL) 100 MG tablet   clonazePAM (KLONOPIN) 1 MG tablet   Insomnia    Pt reports worsening insomnia with ability to fall asleep after medications, but awakening 2-3 hours after onset of sleep.    Plan: 1. Discussed the contribution of benzodiazepines to altered sleep cycle.  2. Request pt reduce clonazepam as directed (See Anxiety).  UDS at next visit. 3. Increase trazodone to 100 mg at bedtime.  Consider drug holiday in future. 4. Encouraged good sleep hygiene practices. 5. Followup 4 weeks.         Relevant Medications   traZODone (DESYREL) 100 MG tablet   Chronic neck and back pain    Pt currently in moderate level of pain with hypertonicity of muscles.  Pt has higher level of pain from shingles  than from back pain.  Pt reports no current use of opioids except for short term dose provided by an urgent care provider for shingles pain that was not effective.    Plan: 1. Continue baclofen, but increase to baclofen 10 mg four times daily.  Encouraged pt to only increase dose as instructed by provider.  Can use 20 mg once daily with 10 mg twice daily if needed.  More frequent dosing is likely to provide more relief than high doses twice daily.  Cautioned kidney damage  with high doses. 2. Consider gabapentin in future for radiculopathy of lower spine since may be helping currently with use for shingles. 3. Consider PT in future. 4. STOP meloxicam. 5. Followup 4 weeks.      Relevant Medications   gabapentin (NEURONTIN) 300 MG capsule   baclofen (LIORESAL) 10 MG tablet   traZODone (DESYREL) 100 MG tablet   clonazePAM (KLONOPIN) 1 MG tablet    Other Visit Diagnoses    Herpes zoster without complication     Pt with healing shingles lesions present along left T1 dermatome.  Pt reports severe pain with touching of his arm to his side.  He is currently unable to use deodorant.  Plan: 1. Continue valacyclovir as previously prescribed. 2. Continue gabapentin, but increase to 600 mg three times daily as needed for pain. 3. Followup as needed.   Relevant Medications   valACYclovir (VALTREX) 1000 MG tablet   Neuropathic pain     Acute neuropathic pain 2/2 shingles.  See AP above.   Relevant Medications   gabapentin (NEURONTIN) 300 MG capsule      Meds ordered this encounter  Medications  . gabapentin (NEURONTIN) 300 MG capsule    Sig: Take 2 capsules (600 mg total) by mouth 3 (three) times daily.    Dispense:  180 capsule    Refill:  1    Order Specific Question:   Supervising Provider    Answer:   Olin Hauser [[id number]]  . baclofen (LIORESAL) 10 MG tablet    Sig: Take 1 tablet (10 mg total) by mouth 4 (four) times daily.    Dispense:  120 each    Refill:  1    Order  Specific Question:   Supervising Provider    Answer:   Olin Hauser [[id number]]  . DISCONTD: clonazePAM (KLONOPIN) 1 MG tablet    Sig: Take 2 tablets (1mg ) once daily for anxiety.  Take 3 tablets (1.5mg ) at night for sleep or 14 days.  Then, reduce to 2 tablet (1 mg) at night for sleep.    Dispense:  134 tablet    Refill:  0    Order Specific Question:   Supervising Provider    Answer:   Olin Hauser [[id number]]  . traZODone (DESYREL) 100 MG tablet    Sig: Take 1 tablet (100 mg total) by mouth at bedtime as needed for sleep.    Dispense:  30 tablet    Refill:  2    Order Specific Question:   Supervising Provider    Answer:   Olin Hauser [[id number]]  . clonazePAM (KLONOPIN) 1 MG tablet    Sig: Take 2 tablets (1mg ) once daily for anxiety.  Take 3 tablets (1.5mg ) at night for 14 days.  Then, take 2 tablets (1mg ) at night for sleep.    Dispense:  134 tablet    Refill:  0    Order Specific Question:   Supervising Provider    Answer:   Olin Hauser [[id number]]  . metFORMIN (GLUCOPHAGE-XR) 500 MG 24 hr tablet    Sig: Take 1 tablet (500 mg total) by mouth 2 (two) times daily.    Dispense:  180 tablet    Refill:  1    Pt will call when he needs refill    Order Specific Question:   Supervising Provider    Answer:   Olin Hauser [[id number]]  . cloNIDine (CATAPRES) 0.1 MG tablet    Sig: Take 1 tablet (0.1 mg total) by  mouth 2 (two) times daily.    Dispense:  180 tablet    Refill:  0    Order Specific Question:   Supervising Provider    Answer:   Olin Hauser [[id number]]  . omeprazole (PRILOSEC) 40 MG capsule    Sig: Take 1 capsule (40 mg total) by mouth daily.    Dispense:  90 capsule    Refill:  1    Order Specific Question:   Supervising Provider    Answer:   Olin Hauser [[id number]]      Follow up plan: Return in about 4 weeks (around 11/30/2017) for insomnia, anxiety, pain.  A total of 40 minutes was spent face-to-face with this  patient. Greater than 50% of this time was spent in counseling and coordination of care with the patient regarding controlled substance reduction of clonazepam/withdrawal symptoms, insomnia/sleep hygiene/drug holidays, shingles and neuropathic pain.  Cassell Smiles, DNP, AGPCNP-BC Adult Gerontology Primary Care Nurse Practitioner [facility] 11/02/2017, 1:17 PM

## 2017-11-02 NOTE — Assessment & Plan Note (Signed)
Pt requests refill of omeprazole.  Refill provided.  Review further at future visit.

## 2017-11-02 NOTE — Assessment & Plan Note (Signed)
Pt currently in moderate level of pain with hypertonicity of muscles.  Pt has higher level of pain from shingles than from back pain.  Pt reports no current use of opioids except for short term dose provided by an urgent care provider for shingles pain that was not effective.    Plan: 1. Continue baclofen, but increase to baclofen 10 mg four times daily.  Encouraged pt to only increase dose as instructed by provider.  Can use 20 mg once daily with 10 mg twice daily if needed.  More frequent dosing is likely to provide more relief than high doses twice daily.  Cautioned kidney damage with high doses. 2. Consider gabapentin in future for radiculopathy of lower spine since may be helping currently with use for shingles. 3. Consider PT in future. 4. STOP meloxicam. 5. Followup 4 weeks.

## 2017-11-02 NOTE — Assessment & Plan Note (Signed)
Currently moderately controlled anxiety with depression, but ongoing symptoms without panic disorder.  Pt not currently on adequate medical therapy for mood disorders.  Is only using clonazepam 3 mg daily (divided doses 1 mg day, 2 mg bedtime) and trazodone 50 mg at night. Pt is now off oxycodone and is willing to work toward changing clonazepam.  Plan: 1. Reduce clonazepam to 1 mg daytime, 1.5 mg at bedtime for 14 days. Then, take 1 mg daytime and 1 mg at bedtime.  Goal is to discontinue clonazepam completely within 3 months. 2. Increase trazodone to 100mg  nightly for SSRI dose increase to aid in anxiety symptoms and insomnia. 3. Encouraged stress management. 4. Followup 4 weeks.

## 2017-11-02 NOTE — Patient Instructions (Addendum)
Juan Le, Thank you for coming in to clinic today.  1. For your shingles pain: - INCREASE gabapentin to 300 mg capsule. Take 2 capsules three times per day. - May use topical lidocaine cream.  Apercreme with lidocaine is 4% lidocaine.  There are generics as well.  Don't use while you have open sores.  Scabbed sores are ok.   2. For sleep: - Reduce your clonazepam at night to 1.5 mg at bedtime for 14 days.  Then, take 1mg  at night. - START trazodone 100mg  at night.   3. For your back pain: - Take baclofen 10 mg four times daily. You have 4 tablets to spread out throughout the day.  If you need one 20 mg dose, that is fine.   Please schedule a follow-up appointment with Cassell Smiles, AGNP. Return in about 4 weeks (around 11/30/2017) for insomnia, anxiety, pain.    If you have any other questions or concerns, please feel free to call the clinic or send a message through [place]. You may also schedule an earlier appointment if necessary.  You will receive a survey after today's visit either digitally by e-mail or paper by [employer]. Your experiences and feedback matter to [place].  Please respond so we know how we are doing as we provide care for you.   Cassell Smiles, DNP, AGNP-BC Adult Gerontology Nurse Practitioner [place]

## 2017-11-02 NOTE — Assessment & Plan Note (Signed)
Pt requests refill of metformin. Refill provided. Further review at future visit.

## 2017-11-02 NOTE — Assessment & Plan Note (Signed)
Pt requests refill of clonidine.  Refill provided.

## 2017-11-02 NOTE — Assessment & Plan Note (Addendum)
Pt reports worsening insomnia with ability to fall asleep after medications, but awakening 2-3 hours after onset of sleep.    Plan: 1. Discussed the contribution of benzodiazepines to altered sleep cycle.  2. Request pt reduce clonazepam as directed (See Anxiety).  UDS at next visit. 3. Increase trazodone to 100 mg at bedtime.  Consider drug holiday in future. 4. Encouraged good sleep hygiene practices. 5. Followup 4 weeks.

## 2017-11-29 ENCOUNTER — Ambulatory Visit (INDEPENDENT_AMBULATORY_CARE_PROVIDER_SITE_OTHER): Payer: Medicare HMO | Admitting: Nurse Practitioner

## 2017-11-29 ENCOUNTER — Encounter: Payer: Self-pay | Admitting: Nurse Practitioner

## 2017-11-29 ENCOUNTER — Other Ambulatory Visit: Payer: Self-pay

## 2017-11-29 ENCOUNTER — Other Ambulatory Visit: Payer: Self-pay | Admitting: Nurse Practitioner

## 2017-11-29 VITALS — BP 105/59 | HR 59 | Temp 98.6°F | Resp 18 | Ht 70.0 in | Wt 208.8 lb

## 2017-11-29 DIAGNOSIS — F1120 Opioid dependence, uncomplicated: Secondary | ICD-10-CM | POA: Diagnosis not present

## 2017-11-29 DIAGNOSIS — I10 Essential (primary) hypertension: Secondary | ICD-10-CM

## 2017-11-29 DIAGNOSIS — B0229 Other postherpetic nervous system involvement: Secondary | ICD-10-CM

## 2017-11-29 DIAGNOSIS — M549 Dorsalgia, unspecified: Secondary | ICD-10-CM

## 2017-11-29 DIAGNOSIS — E119 Type 2 diabetes mellitus without complications: Secondary | ICD-10-CM

## 2017-11-29 DIAGNOSIS — F5105 Insomnia due to other mental disorder: Secondary | ICD-10-CM | POA: Diagnosis not present

## 2017-11-29 DIAGNOSIS — G8929 Other chronic pain: Secondary | ICD-10-CM | POA: Diagnosis not present

## 2017-11-29 DIAGNOSIS — E781 Pure hyperglyceridemia: Secondary | ICD-10-CM

## 2017-11-29 DIAGNOSIS — M542 Cervicalgia: Secondary | ICD-10-CM

## 2017-11-29 DIAGNOSIS — M792 Neuralgia and neuritis, unspecified: Secondary | ICD-10-CM | POA: Diagnosis not present

## 2017-11-29 DIAGNOSIS — F419 Anxiety disorder, unspecified: Secondary | ICD-10-CM

## 2017-11-29 MED ORDER — TELMISARTAN 40 MG PO TABS: 40.0000 mg | ORAL_TABLET | Freq: Every day | ORAL | 1 refills | Status: DC

## 2017-11-29 MED ORDER — CLONAZEPAM 1 MG PO TABS: ORAL_TABLET | ORAL | 0 refills | Status: DC

## 2017-11-29 MED ORDER — BACLOFEN 10 MG PO TABS: 10.0000 mg | ORAL_TABLET | Freq: Two times a day (BID) | ORAL | 1 refills | Status: DC

## 2017-11-29 MED ORDER — LIDOCAINE 5 % EX PTCH: 1.0000 | MEDICATED_PATCH | CUTANEOUS | 0 refills | Status: DC

## 2017-11-29 MED ORDER — GABAPENTIN 400 MG PO CAPS: 800.0000 mg | ORAL_CAPSULE | Freq: Three times a day (TID) | ORAL | 2 refills | Status: DC

## 2017-11-29 NOTE — Patient Instructions (Addendum)
Juan Le, Thank you for coming in to clinic today.  1. Reduce clonazepam by 0.5 tablet every two weeks.  For 14 days, take 2 tablets in daytime, 1 tablet at night.  Then, Take 1 tablet twice daily and continue to next visit.  2. Change gabapentin to 400 mg tablets.  Continue 2 tablets three times daily.  3. All other medications continue without changes.  Please schedule a follow-up appointment with Cassell Smiles, AGNP. Return in about 4 weeks (around 12/27/2017) for anxiety, hypertension.  If you have any other questions or concerns, please feel free to call the clinic or send a message through [place]. You may also schedule an earlier appointment if necessary.  You will receive a survey after today's visit either digitally by e-mail or paper by [employer]. Your experiences and feedback matter to [place].  Please respond so we know how we are doing as we provide care for you.   Cassell Smiles, DNP, AGNP-BC Adult Gerontology Nurse Practitioner [place]

## 2017-11-29 NOTE — Progress Notes (Signed)
Subjective:    Patient ID: Juan Le., male    DOB: Apr 11, 1946, 72 y.o.   MRN: [id number]  Juan Le. is a 72 y.o. male presenting on 11/29/2017 for Anxiety and Pain (neck and back, left side chest pain due to shingles )   HPI Pain Shingles rash is improving, but still having pain relieved by gabapentin. Takes 2 tab tid with relief.  Neuropathic pain is continuing to extend from the sternum under the breast to the patient's back.  He is also continuing to use lidocaine rub which also helps.   Hypertension - He is not checking BP at home or outside of clinic.    - Current medications: Telmisartan 40 mg once daily, clonidine 0.1 mg twice daily, tolerating well without side effects - He is not currently symptomatic. - Pt denies headache, lightheadedness, dizziness, changes in vision, chest tightness/pressure, palpitations, leg swelling, sudden loss of speech or loss of consciousness. - He  reports no regular exercise routine. - His diet is moderate in salt, high in fat, and high in carbohydrates.  Anxiety  Patient reports his anxiety today is well controlled.  Trazodone 100 mg at bedtime, clonazepam is continuing to be tapered.  Patient is only taking half tablet for 0.5 mg twice daily.  He states he is only occasionally nervous and anxious.  Diabetes Pt presents today for follow up of Type 2 diabetes mellitus. He is not checking CBG at home - Current diabetic medications include: metformin - He is not currently symptomatic.  - He denies polydipsia, polyphagia, polyuria, headaches, diaphoresis, shakiness, chills, pain, numbness or tingling in extremities and changes in vision.   - Clinical course has been stable. - He  reports no regular exercise routine. - His diet is moderate in salt, high in fat, and high in carbohydrates. - Weight trend: stable  PREVENTION: Eye exam current (within one year): no Foot exam current (within one year): no  Lipid/ASCVD risk  reduction - on statin: Yes Kidney protection - on ace or arb: Yes Recent Labs    03/22/17 1034 07/15/17 0838 08/30/17 1348 11/29/17 0936  HGBA1C 6.4 5.8 6.1 5.4      Social History   Tobacco Use  . Smoking status: Former Smoker    Types: Cigarettes  . Smokeless tobacco: Never Used  . Tobacco comment: quit in teens  Substance Use Topics  . Alcohol use: No    Alcohol/week: 0.0 oz  . Drug use: No    Review of Systems Per HPI unless specifically indicated above     Objective:    BP (!) 105/59 (BP Location: Right Arm, Patient Position: Sitting, Cuff Size: Normal)   Pulse (!) 59   Temp 98.6 F (37 C) (Oral)   Resp 18   Ht 5\' 10"  (1.778 m)   Wt 208 lb 12.8 oz (94.7 kg)   SpO2 96%   BMI 29.96 kg/m   Wt Readings from Last 3 Encounters:  12/28/17 218 lb 9.6 oz (99.2 kg)  12/12/17 210 lb (95.3 kg)  11/29/17 208 lb 12.8 oz (94.7 kg)    Physical Exam  Constitutional: He is oriented to person, place, and time. He appears well-developed and well-nourished. No distress.  Neck: Normal range of motion. Neck supple. Carotid bruit is not present.  Cardiovascular: Normal rate, regular rhythm, S1 normal, S2 normal, normal heart sounds and intact distal pulses.  Pulmonary/Chest: Effort normal and breath sounds normal. No respiratory distress.  No rash remains on patient's  left chest and chest wall.  Skin is intact  Musculoskeletal: He exhibits no edema (pedal).  Neurological: He is alert and oriented to person, place, and time.  Skin: Skin is warm and dry.  Psychiatric: He has a normal mood and affect. His behavior is normal.  Vitals reviewed.  Results for orders placed or performed in visit on 11/29/17  Hemoglobin A1c  Result Value Ref Range   Hgb A1c MFr Bld 5.4 <5.7 % of total Hgb   Mean Plasma Glucose 108 (calc)   eAG (mmol/L) 6.0 (calc)  Comprehensive metabolic panel  Result Value Ref Range   Glucose, Bld 111 (H) 65 - 99 mg/dL   BUN 17 7 - 25 mg/dL   Creat 1.03 0.70  - 1.18 mg/dL   BUN/Creatinine Ratio NOT APPLICABLE 6 - 22 (calc)   Sodium 137 135 - 146 mmol/L   Potassium 4.2 3.5 - 5.3 mmol/L   Chloride 101 98 - 110 mmol/L   CO2 24 20 - 32 mmol/L   Calcium 10.0 8.6 - 10.3 mg/dL   Total Protein 7.7 6.1 - 8.1 g/dL   Albumin 4.8 3.6 - 5.1 g/dL   Globulin 2.9 1.9 - 3.7 g/dL (calc)   AG Ratio 1.7 1.0 - 2.5 (calc)   Total Bilirubin 0.6 0.2 - 1.2 mg/dL   Alkaline phosphatase (APISO) 55 40 - 115 U/L   AST 23 10 - 35 U/L   ALT 22 9 - 46 U/L  Lipid panel  Result Value Ref Range   Cholesterol 126 <200 mg/dL   HDL 32 (L) >40 mg/dL   Triglycerides 182 (H) <150 mg/dL   LDL Cholesterol (Calc) 68 mg/dL (calc)   Total CHOL/HDL Ratio 3.9 <5.0 (calc)   Non-HDL Cholesterol (Calc) 94 <130 mg/dL (calc)      Assessment & Plan:   Problem List Items Addressed This Visit      Endocrine   Type 2 diabetes mellitus without complications ([place]) - Primary Stable and improving with A1c check.  He continues to have some dietary indiscretions, but overall is doing fairly well.  Plan: 1.  Encouraged patient to obtain eye exam.  Patient continues to take metformin and tolerate well. 2.  Encouraged low glycemic diet. 3.  Encouraged regular physical activity. 4.  Continue medications without changes. 5.  Hemoglobin A1c and CMP today. 6.  Follow-up 3 months.   Relevant Orders   Hemoglobin A1c (Completed)   Comprehensive metabolic panel (Completed)     Other   Anxiety Patient continues to have improvement of anxiety.  He is also continuing to taper clonazepam.  He has not had any withdrawal symptoms with the taper.   Plan: 1.  Continue clonazepam taper. 2.  Continue trazodone at bedtime. 3.  Follow-up 4 weeks.   Relevant Medications   clonazePAM (KLONOPIN) 1 MG tablet   Hypertriglyceridemia Patient with history of hypertriglyceridemia without recent lipid panel.  Plan: Check lipid panel today.  Continue atorvastatin without changes.  Encourage low glycemic diet  and physical activity.  Follow-up 3 months.   Relevant Orders   Lipid panel (Completed)   Chronic neck and back pain Chronic neck pain is stable.  Patient continues to get relief with baclofen and gabapentin.  Plan: 1.  Continue baclofen 10 mg twice daily as needed for back pain and back spasm 2.  Continue gabapentin, but increase to 400 mg 3 times daily as needed for neuropathic pain. 3.  Follow-up in 4 weeks.   Relevant Medications   baclofen (  LIORESAL) 10 MG tablet   clonazePAM (KLONOPIN) 1 MG tablet   gabapentin (NEURONTIN) 400 MG capsule    Other Visit Diagnoses    Neuropathic pain     Neuropathic pain continues to improve steadily.  No rash remains.  Gabapentin continues to be effective for relief with topical lidocaine ointment.  New  Plan: 1.  Increase gabapentin to 400 mg 3 times daily as needed for neuropathic pain. 2.  Follow-up in 4 weeks.   Relevant Medications   gabapentin (NEURONTIN) 400 MG capsule      Meds ordered this encounter  Medications  . baclofen (LIORESAL) 10 MG tablet    Sig: Take 1 tablet (10 mg total) by mouth 2 (two) times daily.    Dispense:  120 each    Refill:  1    Order Specific Question:   Supervising Provider    Answer:   Olin Hauser [[id number]]  . clonazePAM (KLONOPIN) 1 MG tablet    Sig: Take 2 tablets (1mg ) once daily for anxiety and 1 tablet (0.5mg )at night for 14 days.  Then, take 1 tablet (0.5mg ) twice daily and continue.    Dispense:  74 tablet    Refill:  0    Order Specific Question:   Supervising Provider    Answer:   Olin Hauser [[id number]]  . gabapentin (NEURONTIN) 400 MG capsule    Sig: Take 2 capsules (800 mg total) by mouth 3 (three) times daily.    Dispense:  120 capsule    Refill:  2    Order Specific Question:   Supervising Provider    Answer:   Olin Hauser [[id number]]      Follow up plan: Return in about 4 weeks (around 12/27/2017) for anxiety, hypertension.  Cassell Smiles, DNP,  AGPCNP-BC Adult Gerontology Primary Care Nurse Practitioner [facility] 01/07/2018, 8:34 AM

## 2017-11-30 LAB — HEMOGLOBIN A1C
Hgb A1c MFr Bld: 5.4 % of total Hgb (ref <5.7)
Mean Plasma Glucose: 108 (calc)
eAG (mmol/L): 6 (calc)

## 2017-11-30 LAB — COMPREHENSIVE METABOLIC PANEL
AG Ratio: 1.7 (calc) (ref 1.0–2.5)
ALT: 22 U/L (ref 9–46)
AST: 23 U/L (ref 10–35)
Albumin: 4.8 g/dL (ref 3.6–5.1)
Alkaline phosphatase (APISO): 55 U/L (ref 40–115)
BUN: 17 mg/dL (ref 7–25)
CO2: 24 mmol/L (ref 20–32)
Calcium: 10 mg/dL (ref 8.6–10.3)
Chloride: 101 mmol/L (ref 98–110)
Creat: 1.03 mg/dL (ref 0.70–1.18)
Globulin: 2.9 g/dL (calc) (ref 1.9–3.7)
Glucose, Bld: 111 mg/dL — ABNORMAL HIGH (ref 65–99)
Potassium: 4.2 mmol/L (ref 3.5–5.3)
Sodium: 137 mmol/L (ref 135–146)
Total Bilirubin: 0.6 mg/dL (ref 0.2–1.2)
Total Protein: 7.7 g/dL (ref 6.1–8.1)

## 2017-11-30 LAB — LIPID PANEL
Cholesterol: 126 mg/dL (ref <200)
HDL: 32 mg/dL — ABNORMAL LOW (ref >40)
LDL Cholesterol (Calc): 68 mg/dL (calc)
Non-HDL Cholesterol (Calc): 94 mg/dL (calc) (ref <130)
Total CHOL/HDL Ratio: 3.9 (calc) (ref <5.0)
Triglycerides: 182 mg/dL — ABNORMAL HIGH (ref <150)

## 2017-12-11 NOTE — Progress Notes (Signed)
Juan Le Consultation      Assessment and Plan:  The patient is a 72 year old male with OSA and dyspnea on exertion, due to emphysema.   Emphysema.  -Hyperinflation seen on recent chest x-ray imaging, possibly consistent with emphysema. The patient is a nonsmoker, but his wife is a smoker. -Continue Anoro. He declines PFT.  --He does not want to do pulmonary rehab.  --Will start rescue inhaler to use as needed.   Dyspnea on exertion.  -Multifactorial from possible underlying emphysema, as well as deconditioning.  Obstructive sleep apnea -Sleep study 04/27/17; AHI equals 24. Started on AutoPap 8-14. -He declines to use PAP.   Meds ordered this encounter  Medications  . albuterol (PROVENTIL HFA;VENTOLIN HFA) 108 (90 Base) MCG/ACT inhaler    Sig: Inhale 1-2 puffs into the lungs every 6 (six) hours as needed for wheezing or shortness of breath.    Dispense:  1 Inhaler    Refill:  5   Return in about 6 months (around 06/11/2018).   Date: 12/11/2017  MRN# [id number] Juan Le. November 25, 1945   Juan Le. is a 72 y.o. old male seen in consultation for chief complaint of:    Chief Complaint  Patient presents with  . COPD    Pt here for f/u he refused cpap therapy. Pt states he has some sob with exertion.Marland Kitchen    HPI:   The patient is a 72 yo male with COPD and progressive dyspnea.  He has also noted recently that he had a "spell" in the shower a few weeks ago and since then he has been taking it easy. He had previously declined PFT and sleep testing. He found that Anoro was continued and opted to continue.  He has continued to have dyspnea on exertion, he has been using Anoro every day and feel that it is helping. He does have a rescue inhaler, he tries to remain active vacuuming and cleaning.    **Sat walk 04/13/17; at rest on RA sat was 98% and HR 69; after 180 feet sat was 95% and HR 84 mild dyspnea at moderate pace. After 360 feet was 98%  and HR 89, moderate dyspnea.   **chest x-ray 03/24/17: Mild hyperinflation, cardiomegaly, otherwise unremarkable.  Medication:    Current Outpatient Medications:  .  aspirin 81 MG tablet, Take 1 tablet by mouth daily., Disp: , Rfl:  .  atorvastatin (LIPITOR) 40 MG tablet, Take 1 tablet (40 mg total) by mouth daily at 6 PM., Disp: 90 tablet, Rfl: 1 .  baclofen (LIORESAL) 10 MG tablet, Take 1 tablet (10 mg total) by mouth 2 (two) times daily., Disp: 120 each, Rfl: 1 .  clonazePAM (KLONOPIN) 1 MG tablet, Take 2 tablets (1mg ) once daily for anxiety and 1 tablet (0.5mg )at night for 14 days.  Then, take 1 tablet (0.5mg ) twice daily and continue., Disp: 74 tablet, Rfl: 0 .  cloNIDine (CATAPRES) 0.1 MG tablet, Take 1 tablet (0.1 mg total) by mouth 2 (two) times daily., Disp: 180 tablet, Rfl: 0 .  gabapentin (NEURONTIN) 400 MG capsule, Take 2 capsules (800 mg total) by mouth 3 (three) times daily., Disp: 120 capsule, Rfl: 2 .  glucose blood test strip, Use as instructed, Disp: 50 each, Rfl: 12 .  lidocaine (LIDODERM) 5 %, Place 1 patch onto the skin daily. Remove & Discard patch within 12 hours or as directed by MD, Disp: 30 patch, Rfl: 0 .  metFORMIN (GLUCOPHAGE-XR) 500 MG 24 hr tablet, Take  1 tablet (500 mg total) by mouth 2 (two) times daily., Disp: 180 tablet, Rfl: 1 .  Omega-3 Fatty Acids (FISH OIL) 1000 MG CAPS, Take 2 capsules by mouth daily., Disp: , Rfl:  .  omeprazole (PRILOSEC) 40 MG capsule, Take 1 capsule (40 mg total) by mouth daily., Disp: 90 capsule, Rfl: 1 .  telmisartan (MICARDIS) 40 MG tablet, Take 1 tablet (40 mg total) by mouth daily., Disp: 90 tablet, Rfl: 1 .  traZODone (DESYREL) 100 MG tablet, Take 1 tablet (100 mg total) by mouth at bedtime as needed for sleep., Disp: 30 tablet, Rfl: 2 .  umeclidinium-vilanterol (ANORO ELLIPTA) 62.5-25 MCG/INH AEPB, Inhale 1 puff into the lungs daily., Disp: 1 each, Rfl: 0   Allergies:  Ace inhibitors and Diphenhydramine  Review of  Systems: Gen:  Denies  fever, sweats, chills HEENT: Denies blurred vision, double vision. bleeds, sore throat Cvc:  No dizziness, chest pain. Resp:   Denies cough or sputum production. Gi: Denies swallowing difficulty, stomach pain. Gu:  Denies bladder incontinence, burning urine Ext:   No Joint pain, stiffness. Skin: No skin rash,  hives  Endoc:  No polyuria, polydipsia. Psych: No depression, insomnia. Other:  All other systems were reviewed with the patient and were negative other that what is mentioned in the HPI.   Physical Examination:   VS: BP 112/70 (BP Location: Left Arm, Cuff Size: Normal)   Pulse 60   Resp 16   Ht 5\' 10"  (1.778 m)   Wt 210 lb (95.3 kg)   SpO2 100%   BMI 30.13 kg/m   General Appearance: No distress  Neuro:without focal findings,  speech normal,  HEENT: PERRLA, EOM intact.  Mallampati 3. Pulmonary: normal breath sounds, No wheezing.  CardiovascularNormal S1,S2.  No m/r/g.   Abdomen: Benign, Soft, non-tender. Renal:  No costovertebral tenderness  GU:  No performed at this time. Endoc: No evident thyromegaly, no signs of acromegaly. Skin:   warm, no rashes, no ecchymosis  Extremities: normal, no cyanosis, clubbing.  Other findings:    LABORATORY PANEL:   CBC No results for input(s): WBC, HGB, HCT, PLT in the last 168 hours. ------------------------------------------------------------------------------------------------------------------  Chemistries  No results for input(s): NA, K, CL, CO2, GLUCOSE, BUN, CREATININE, CALCIUM, MG, AST, ALT, ALKPHOS, BILITOT in the last 168 hours.  Invalid input(s): GFRCGP ------------------------------------------------------------------------------------------------------------------  Cardiac Enzymes No results for input(s): TROPONINI in the last 168 hours. ------------------------------------------------------------  RADIOLOGY:  No results found.     Thank  you for the consultation and for allowing  [facility] to assist in the care of your patient. Our recommendations are noted above.  Please contact us if we can be of further service.   Juan Stalker, MD.  Board Certified in Internal [place], Pulmonary [place], [place], and Sleep [place].  [facility] Office Number: [phone]  Patricia Pesa, M.D.  Merton Border, M.D  12/12/2017

## 2017-12-12 ENCOUNTER — Encounter: Payer: Self-pay | Admitting: Internal Medicine

## 2017-12-12 ENCOUNTER — Ambulatory Visit: Payer: Medicare HMO | Admitting: Internal Medicine

## 2017-12-12 VITALS — BP 112/70 | HR 60 | Resp 16 | Ht 70.0 in | Wt 210.0 lb

## 2017-12-12 DIAGNOSIS — G4733 Obstructive sleep apnea (adult) (pediatric): Secondary | ICD-10-CM | POA: Diagnosis not present

## 2017-12-12 DIAGNOSIS — J449 Chronic obstructive pulmonary disease, unspecified: Secondary | ICD-10-CM | POA: Diagnosis not present

## 2017-12-12 MED ORDER — ALBUTEROL SULFATE HFA 108 (90 BASE) MCG/ACT IN AERS: 1.0000 | INHALATION_SPRAY | Freq: Four times a day (QID) | RESPIRATORY_TRACT | 5 refills | Status: DC | PRN

## 2017-12-12 NOTE — Patient Instructions (Signed)
Continue anoro once daily.  Use rescue inhaler as needed, 1-2 puffs when feeling winded.

## 2017-12-28 ENCOUNTER — Ambulatory Visit (INDEPENDENT_AMBULATORY_CARE_PROVIDER_SITE_OTHER): Payer: Medicare HMO | Admitting: Nurse Practitioner

## 2017-12-28 ENCOUNTER — Encounter: Payer: Self-pay | Admitting: Nurse Practitioner

## 2017-12-28 VITALS — BP 95/60 | HR 55 | Temp 98.3°F | Resp 16 | Ht 70.0 in | Wt 218.6 lb

## 2017-12-28 DIAGNOSIS — F5105 Insomnia due to other mental disorder: Secondary | ICD-10-CM | POA: Diagnosis not present

## 2017-12-28 DIAGNOSIS — F419 Anxiety disorder, unspecified: Secondary | ICD-10-CM

## 2017-12-28 DIAGNOSIS — R001 Bradycardia, unspecified: Secondary | ICD-10-CM | POA: Diagnosis not present

## 2017-12-28 DIAGNOSIS — I10 Essential (primary) hypertension: Secondary | ICD-10-CM | POA: Diagnosis not present

## 2017-12-28 DIAGNOSIS — F99 Mental disorder, not otherwise specified: Secondary | ICD-10-CM | POA: Diagnosis not present

## 2017-12-28 DIAGNOSIS — E119 Type 2 diabetes mellitus without complications: Secondary | ICD-10-CM | POA: Diagnosis not present

## 2017-12-28 MED ORDER — HYDROCHLOROTHIAZIDE 25 MG PO TABS: 25.0000 mg | ORAL_TABLET | Freq: Every day | ORAL | 3 refills | Status: DC

## 2017-12-28 MED ORDER — TRAZODONE HCL 100 MG PO TABS: 100.0000 mg | ORAL_TABLET | Freq: Every evening | ORAL | 2 refills | Status: DC | PRN

## 2017-12-28 MED ORDER — TELMISARTAN 40 MG PO TABS: 40.0000 mg | ORAL_TABLET | Freq: Every day | ORAL | 1 refills | Status: DC

## 2017-12-28 NOTE — Patient Instructions (Addendum)
Juan Le, Thank you for coming in to clinic today.  1. Reduce clonazepam to 0.5 mg once daily at bedtime.  After 4 weeks, stop completely.  2. Continue trazodone and gabapentin for sleep and gabapentin for pain.  3. STOP clonidine 0.1 mg. START hydrochlorothiazide 25 mg once daily instead.  Please schedule a follow-up appointment with Cassell Smiles, AGNP. Return in about 3 months (around 03/30/2018) for hypertension, anxiety, insomnia.  If you have any other questions or concerns, please feel free to call the clinic or send a message through [place]. You may also schedule an earlier appointment if necessary.  You will receive a survey after today's visit either digitally by e-mail or paper by [employer]. Your experiences and feedback matter to [place].  Please respond so we know how we are doing as we provide care for you.   Cassell Smiles, DNP, AGNP-BC Adult Gerontology Nurse Practitioner [place]

## 2017-12-28 NOTE — Progress Notes (Signed)
Subjective:    Patient ID: Juan Le., male    DOB: 1946-05-03, 72 y.o.   MRN: [id number]  Juan Le. is a 72 y.o. male presenting on 12/28/2017 for Anxiety and Hypertension   HPI Anxiety Stable and well managed.  Pt is taking Trazodone, gabapentin for anxiety.  He has no acute concerns about anxiety today.  He has successfully completed his clonazepam taper and reports he is no longer taking clonazepam except for 1 mg at bedtime.  Chronic pain - well managed on gabapentin Is currently not taking baclofen very frequently.  He reports good pain control and currently is satisfied with level of pain.  - Shingles pain is still persistent and under arm.  No recent improvement.  Lidocaine is helping significantly for relief.  Hypertension - He is checking BP at home or outside of clinic.  Readings 110-120 SBP at home - Normally takes meds 8:30-9 am, 5 pm, bedtime - Current medications: clonidine 0.1 mg bid, telmisartan 40 mg once daily, tolerating well without side effects except for today - pt is experiencing low blood pressure and fatigue.  Denies palpitations - He is symptomatic with fatigue. - Pt denies headache, lightheadedness, dizziness, changes in vision, chest tightness/pressure, palpitations, leg swelling, sudden loss of speech or loss of consciousness. - He  reports no regular exercise routine. - His diet is moderate in salt, high in fat, and high in carbohydrates.   Social History   Tobacco Use  . Smoking status: Former Smoker    Types: Cigarettes  . Smokeless tobacco: Never Used  . Tobacco comment: quit in teens  Substance Use Topics  . Alcohol use: No    Alcohol/week: 0.0 oz  . Drug use: No    Review of Systems Per HPI unless specifically indicated above     Objective:    BP 95/60 (BP Location: Right Arm, Patient Position: Sitting, Cuff Size: Large)   Pulse (!) 55   Temp 98.3 F (36.8 C) (Oral)   Resp 16   Ht 5\' 10"  (1.778 m)   Wt 218 lb  9.6 oz (99.2 kg)   SpO2 97%   BMI 31.37 kg/m   Wt Readings from Last 3 Encounters:  12/28/17 218 lb 9.6 oz (99.2 kg)  12/12/17 210 lb (95.3 kg)  11/29/17 208 lb 12.8 oz (94.7 kg)    Physical Exam  Constitutional: He is oriented to person, place, and time. He appears well-developed and well-nourished. No distress.  Neck: Normal range of motion. Neck supple. Carotid bruit is not present.  Cardiovascular: Normal rate, S1 normal, S2 normal, normal heart sounds and intact distal pulses. An irregularly irregular rhythm present.  Pulmonary/Chest: Effort normal and breath sounds normal. No respiratory distress.  Musculoskeletal: He exhibits no edema (pedal).  Neurological: He is alert and oriented to person, place, and time.  Skin: Skin is warm and dry.  Psychiatric: He has a normal mood and affect. His behavior is normal.  Vitals reviewed.   EKG today: regular sinus rhythm with bradycardia and first-degree heart block.   Results for orders placed or performed in visit on 11/29/17  Hemoglobin A1c  Result Value Ref Range   Hgb A1c MFr Bld 5.4 <5.7 % of total Hgb   Mean Plasma Glucose 108 (calc)   eAG (mmol/L) 6.0 (calc)  Comprehensive metabolic panel  Result Value Ref Range   Glucose, Bld 111 (H) 65 - 99 mg/dL   BUN 17 7 - 25 mg/dL   Creat  1.03 0.70 - 1.18 mg/dL   BUN/Creatinine Ratio NOT APPLICABLE 6 - 22 (calc)   Sodium 137 135 - 146 mmol/L   Potassium 4.2 3.5 - 5.3 mmol/L   Chloride 101 98 - 110 mmol/L   CO2 24 20 - 32 mmol/L   Calcium 10.0 8.6 - 10.3 mg/dL   Total Protein 7.7 6.1 - 8.1 g/dL   Albumin 4.8 3.6 - 5.1 g/dL   Globulin 2.9 1.9 - 3.7 g/dL (calc)   AG Ratio 1.7 1.0 - 2.5 (calc)   Total Bilirubin 0.6 0.2 - 1.2 mg/dL   Alkaline phosphatase (APISO) 55 40 - 115 U/L   AST 23 10 - 35 U/L   ALT 22 9 - 46 U/L  Lipid panel  Result Value Ref Range   Cholesterol 126 <200 mg/dL   HDL 32 (L) >40 mg/dL   Triglycerides 182 (H) <150 mg/dL   LDL Cholesterol (Calc) 68 mg/dL  (calc)   Total CHOL/HDL Ratio 3.9 <5.0 (calc)   Non-HDL Cholesterol (Calc) 94 <130 mg/dL (calc)      Assessment & Plan:   Problem List Items Addressed This Visit      Cardiovascular and Mediastinum   Hypertension Hypotensive today on exam.  Pt reports only taking medications that were prescribed and as prescribed without extra pills.  BP goal < 130/80.  Pt is not working on lifestyle modifications.  Taking medications tolerating well without side effects.   Plan: 1. Continue taking telmisartan.   -Stop clonidine  -Start hydrochlorothiazide 25 mg once daily  2. Obtain labs at next visit as last labs were normal last visit.  3. Encouraged heart healthy diet and increasing exercise to 30 minutes most days of the week. 4. Check BP 1-2 x per week at home, keep log, and bring to clinic at next appointment. 5. Follow up 3 months.     Relevant Medications   hydrochlorothiazide (HYDRODIURIL) 25 MG tablet   telmisartan (MICARDIS) 40 MG tablet     Type 2 diabetes mellitus without complications (HCC)   Relevant Medications   telmisartan (MICARDIS) 40 MG tablet     Other   Anxiety    Relevant Medications   traZODone (DESYREL) 100 MG tablet   Insomnia   Relevant Medications   traZODone (DESYREL) 100 MG tablet  Anxiety and insomnia currently well controlled on current regimen.patient continues to take trazodone 100 mg at bedtime, gabapentin 800 mg 3 times daily for shingles pain, anxiety, insomnia.  Encouraged ongoing work for sleep hygiene.  PHQ 9 stable.  Except for 1 mg at bedtime, patient has stopped taking clonazepam.  Patient will reduce to half milligram once daily at bedtime and stop completely after 4 weeks.  Follow-up 3 months.    Other Visit Diagnoses    Bradycardia    -  Primary Patient with irregular, bradycardic heart rate on exam today.  EKG in clinic revealed only bradycardia with first-degree heart block.  No changes to medications are necessary at this time.  Hold  clonidine today.   Relevant Orders   EKG 12-Lead      Meds ordered this encounter  Medications  . hydrochlorothiazide (HYDRODIURIL) 25 MG tablet    Sig: Take 1 tablet (25 mg total) by mouth daily.    Dispense:  90 tablet    Refill:  3    Order Specific Question:   Supervising Provider    Answer:   Olin Hauser [[id number]]  . traZODone (DESYREL) 100 MG tablet  Sig: Take 1 tablet (100 mg total) by mouth at bedtime as needed for sleep.    Dispense:  30 tablet    Refill:  2    Order Specific Question:   Supervising Provider    Answer:   Olin Hauser [[id number]]  . telmisartan (MICARDIS) 40 MG tablet    Sig: Take 1 tablet (40 mg total) by mouth daily.    Dispense:  90 tablet    Refill:  1    Order Specific Question:   Supervising Provider    Answer:   Olin Hauser [[id number]]   Follow up plan: Return in about 3 months (around 03/30/2018) for hypertension, anxiety, insomnia.  Cassell Smiles, DNP, AGPCNP-BC Adult Gerontology Primary Care Nurse Practitioner [facility] 12/28/2017, 8:14 AM

## 2018-01-07 ENCOUNTER — Encounter: Payer: Self-pay | Admitting: Nurse Practitioner

## 2018-01-12 ENCOUNTER — Telehealth: Payer: Self-pay

## 2018-01-12 NOTE — Telephone Encounter (Signed)
Prior med is fine.  If he is having any low BPs again, should skip that dose OR next dose of clonidine.

## 2018-01-12 NOTE — Telephone Encounter (Signed)
Attempted to contact the pt, no answer. LMOM to return my call.  

## 2018-01-12 NOTE — Telephone Encounter (Signed)
I spoke w/ the pt , he informed me that he's been having some high blood pressure reading since the change of his bp medication. He state his blood pressure was running high [phone] for his systolic number. He didn't remember his diastolic reading. He states he went back to his previous medication and now his blood pressure is back in normal range 120/80's range.

## 2018-01-17 ENCOUNTER — Other Ambulatory Visit: Payer: Self-pay | Admitting: Nurse Practitioner

## 2018-01-24 ENCOUNTER — Ambulatory Visit (INDEPENDENT_AMBULATORY_CARE_PROVIDER_SITE_OTHER): Payer: Medicare HMO

## 2018-01-24 VITALS — BP 130/72 | HR 62 | Temp 98.7°F | Resp 16 | Ht 70.0 in | Wt 218.4 lb

## 2018-01-24 DIAGNOSIS — Z0000 Encounter for general adult medical examination without abnormal findings: Secondary | ICD-10-CM

## 2018-01-24 NOTE — Patient Instructions (Signed)
[name] , Thank you for taking time to come for your Medicare Wellness Visit. I appreciate your ongoing commitment to your health goals. Please review the following plan we discussed and let me know if I can assist you in the future.   Screening recommendations/referrals: Colonoscopy: completed 01/28/2017 Recommended yearly ophthalmology/optometry visit for glaucoma screening and checkup Recommended yearly dental visit for hygiene and checkup  Vaccinations: Influenza vaccine: up to date Pneumococcal vaccine: up to date Tdap vaccine: up to date Shingles vaccine: eligible, check with your insurance company for coverage information.   Advanced directives: Please bring a copy of your health care power of attorney and living will to the office at your convenience.  Conditions/risks identified: Recommend drinking at least 6-8 glasses of water a day   Next appointment: Follow up on 03/31/2018 at 8:20pm with [name]. Follow up in one year for your annual wellness exam.   Preventive Care 72 Years and Older, Male Preventive care refers to lifestyle choices and visits with your health care provider that can promote health and wellness. What does preventive care include?  A yearly physical exam. This is also called an annual well check.  Dental exams once or twice a year.  Routine eye exams. Ask your health care provider how often you should have your eyes checked.  Personal lifestyle choices, including:  Daily care of your teeth and gums.  Regular physical activity.  Eating a healthy diet.  Avoiding tobacco and drug use.  Limiting alcohol use.  Practicing safe sex.  Taking low doses of aspirin every day.  Taking vitamin and mineral supplements as recommended by your health care provider. What happens during an annual well check? The services and screenings done by your health care provider during your annual well check will depend on your age, overall health,  lifestyle risk factors, and family history of disease. Counseling  Your health care provider may ask you questions about your:  Alcohol use.  Tobacco use.  Drug use.  Emotional well-being.  Home and relationship well-being.  Sexual activity.  Eating habits.  History of falls.  Memory and ability to understand (cognition).  Work and work Statistician. Screening  You may have the following tests or measurements:  Height, weight, and BMI.  Blood pressure.  Lipid and cholesterol levels. These may be checked every 5 years, or more frequently if you are over 80 years old.  Skin check.  Lung cancer screening. You may have this screening every year starting at age 72 if you have a 30-pack-year history of smoking and currently smoke or have quit within the past 15 years.  Fecal occult blood test (FOBT) of the stool. You may have this test every year starting at age 72.  Flexible sigmoidoscopy or colonoscopy. You may have a sigmoidoscopy every 5 years or a colonoscopy every 10 years starting at age 45.  Prostate cancer screening. Recommendations will vary depending on your family history and other risks.  Hepatitis C blood test.  Hepatitis B blood test.  Sexually transmitted disease (STD) testing.  Diabetes screening. This is done by checking your blood sugar (glucose) after you have not eaten for a while (fasting). You may have this done every 1-3 years.  Abdominal aortic aneurysm (AAA) screening. You may need this if you are a current or former smoker.  Osteoporosis. You may be screened starting at age 78 if you are at high risk. Talk with your health care provider about your test results, treatment options, and if  necessary, the need for more tests. Vaccines  Your health care provider may recommend certain vaccines, such as:  Influenza vaccine. This is recommended every year.  Tetanus, diphtheria, and acellular pertussis (Tdap, Td) vaccine. You may need a Td booster  every 10 years.  Zoster vaccine. You may need this after age 72.  Pneumococcal 13-valent conjugate (PCV13) vaccine. One dose is recommended after age 33.  Pneumococcal polysaccharide (PPSV23) vaccine. One dose is recommended after age 12. Talk to your health care provider about which screenings and vaccines you need and how often you need them. This information is not intended to replace advice given to you by your health care provider. Make sure you discuss any questions you have with your health care provider. Document Released: 10/31/2015 Document Revised: 06/23/2016 Document Reviewed: 08/05/2015 Elsevier Interactive Patient Education  2017 [place] Prevention in the Home Falls can cause injuries. They can happen to people of all ages. There are many things you can do to make your home safe and to help prevent falls. What can I do on the outside of my home?  Regularly fix the edges of walkways and driveways and fix any cracks.  Remove anything that might make you trip as you walk through a door, such as a raised step or threshold.  Trim any bushes or trees on the path to your home.  Use bright outdoor lighting.  Clear any walking paths of anything that might make someone trip, such as rocks or tools.  Regularly check to see if handrails are loose or broken. Make sure that both sides of any steps have handrails.  Any raised decks and porches should have guardrails on the edges.  Have any leaves, snow, or ice cleared regularly.  Use sand or salt on walking paths during winter.  Clean up any spills in your garage right away. This includes oil or grease spills. What can I do in the bathroom?  Use night lights.  Install grab bars by the toilet and in the tub and shower. Do not use towel bars as grab bars.  Use non-skid mats or decals in the tub or shower.  If you need to sit down in the shower, use a plastic, non-slip stool.  Keep the floor dry. Clean up any  water that spills on the floor as soon as it happens.  Remove soap buildup in the tub or shower regularly.  Attach bath mats securely with double-sided non-slip rug tape.  Do not have throw rugs and other things on the floor that can make you trip. What can I do in the bedroom?  Use night lights.  Make sure that you have a light by your bed that is easy to reach.  Do not use any sheets or blankets that are too big for your bed. They should not hang down onto the floor.  Have a firm chair that has side arms. You can use this for support while you get dressed.  Do not have throw rugs and other things on the floor that can make you trip. What can I do in the kitchen?  Clean up any spills right away.  Avoid walking on wet floors.  Keep items that you use a lot in easy-to-reach places.  If you need to reach something above you, use a strong step stool that has a grab bar.  Keep electrical cords out of the way.  Do not use floor polish or wax that makes floors slippery. If you must  use wax, use non-skid floor wax.  Do not have throw rugs and other things on the floor that can make you trip. What can I do with my stairs?  Do not leave any items on the stairs.  Make sure that there are handrails on both sides of the stairs and use them. Fix handrails that are broken or loose. Make sure that handrails are as long as the stairways.  Check any carpeting to make sure that it is firmly attached to the stairs. Fix any carpet that is loose or worn.  Avoid having throw rugs at the top or bottom of the stairs. If you do have throw rugs, attach them to the floor with carpet tape.  Make sure that you have a light switch at the top of the stairs and the bottom of the stairs. If you do not have them, ask someone to add them for you. What else can I do to help prevent falls?  Wear shoes that:  Do not have high heels.  Have rubber bottoms.  Are comfortable and fit you well.  Are closed  at the toe. Do not wear sandals.  If you use a stepladder:  Make sure that it is fully opened. Do not climb a closed stepladder.  Make sure that both sides of the stepladder are locked into place.  Ask someone to hold it for you, if possible.  Clearly mark and make sure that you can see:  Any grab bars or handrails.  First and last steps.  Where the edge of each step is.  Use tools that help you move around (mobility aids) if they are needed. These include:  Canes.  Walkers.  Scooters.  Crutches.  Turn on the lights when you go into a dark area. Replace any light bulbs as soon as they burn out.  Set up your furniture so you have a clear path. Avoid moving your furniture around.  If any of your floors are uneven, fix them.  If there are any pets around you, be aware of where they are.  Review your medicines with your doctor. Some medicines can make you feel dizzy. This can increase your chance of falling. Ask your doctor what other things that you can do to help prevent falls. This information is not intended to replace advice given to you by your health care provider. Make sure you discuss any questions you have with your health care provider. Document Released: 07/31/2009 Document Revised: 03/11/2016 Document Reviewed: 11/08/2014 Elsevier Interactive Patient Education  2017 [employer].

## 2018-01-24 NOTE — Progress Notes (Signed)
Subjective:   Juan Le. is a 72 y.o. male who presents for Medicare Annual/Subsequent preventive examination.  Review of Systems:   Cardiac Risk Factors include: hypertension;advanced age (>44men, >51 women);male gender;dyslipidemia;diabetes mellitus;obesity (BMI >30kg/m2)     Objective:    Vitals: BP 130/72 (BP Location: Left Arm, Patient Position: Sitting)   Pulse 62   Temp 98.7 F (37.1 C) (Oral)   Resp 16   Ht 5\' 10"  (1.778 m)   Wt 218 lb 6.4 oz (99.1 kg)   BMI 31.34 kg/m   Body mass index is 31.34 kg/m.  Advanced Directives 01/24/2018 08/11/2017 06/16/2017 05/20/2017 03/24/2017 03/22/2017 02/18/2017  Does Patient Have a Medical Advance Directive? Yes Yes Yes Yes Yes Yes Yes  Type of Advance Directive Living will;Healthcare Power of [place];Living will [place];Living will [place];Living will [place];Living will [place];Living will [place];Living will  Does patient want to make changes to medical advance directive? - - - - - - -  Copy of [place] in Chart? No - copy requested - - - - - -  Would patient like information on creating a medical advance directive? - - - - - - -    Tobacco Social History   Tobacco Use  Smoking Status Former Smoker  . Types: Cigarettes  Smokeless Tobacco Never Used  Tobacco Comment   quit in teens     Counseling given: Not Answered Comment: quit in teens   Clinical Intake:  Pre-visit preparation completed: Yes  Pain : No/denies pain     Nutritional Status: BMI > 30  Obese Nutritional Risks: None Diabetes: Yes CBG done?: No Did pt. bring in CBG monitor from home?: No  How often do you need to have someone help you when you read instructions, pamphlets, or other written materials from your doctor or pharmacy?: 1 - Never What is the last grade level you completed in school?:  12th grade  Interpreter Needed?: No  Information entered by :: Tiffany Hill,LPN   Past Medical History:  Diagnosis Date  . Anxiety   . Atelectasis of left lung 03/25/2017  . Chronic, continuous use of opioids   . Degenerative disc disease, lumbar   . Diabetes ([place])   . DJD (degenerative joint disease)   . GERD (gastroesophageal reflux disease)   . Heart attack ([place])   . History of diverticulitis   . Hypertension   . Hypertension   . Hypertriglyceridemia   . Hypokalemia   . Insomnia   . Mitral regurgitation   . Sleep apnea   . Vitamin D deficiency   . Wears dentures    full upper   Past Surgical History:  Procedure Laterality Date  . COLONOSCOPY WITH PROPOFOL N/A 01/28/2017   Procedure: COLONOSCOPY WITH PROPOFOL;  Surgeon: [name], MD;  Location: [facility];  Service: Endoscopy;  Laterality: N/A;  diabetic - oral meds  . CORONARY ARTERY BYPASS GRAFT  1997  . CORONARY STENT PLACEMENT  11/2006  . FOOT SURGERY  2003  . HERNIA REPAIR    . POLYPECTOMY N/A 01/28/2017   Procedure: POLYPECTOMY;  Surgeon: [name], MD;  Location: [facility];  Service: Endoscopy;  Laterality: N/A;  . UMBILICAL HERNIA REPAIR  04/2012   Family History  Problem Relation Age of Onset  . Throat cancer Father   . Colon cancer Father   . Heart disease Father   .  Cancer Father   . Depression Father   . Stroke Brother   . Stroke Mother   . Kidney disease Mother   . Heart disease Mother   . Breast cancer Paternal Aunt    Social History   Socioeconomic History  . Marital status: Married    Spouse name: Not on file  . Number of children: 0  . Years of education: Not on file  . Highest education level: Not on file  Occupational History  . Not on file  Social Needs  . Financial resource strain: Not hard at all  . Food insecurity:    Worry: Never true    Inability: Never true  . Transportation needs:    Medical: No    Non-medical: No  Tobacco Use  . Smoking status:  Former Smoker    Types: Cigarettes  . Smokeless tobacco: Never Used  . Tobacco comment: quit in teens  Substance and Sexual Activity  . Alcohol use: No    Alcohol/week: 0.0 oz  . Drug use: No  . Sexual activity: Yes    Partners: Female  Lifestyle  . Physical activity:    Days per week: 0 days    Minutes per session: 0 min  . Stress: Not at all  Relationships  . Social connections:    Talks on phone: More than three times a week    Gets together: Once a week    Attends religious service: Never    Active member of club or organization: No    Attends meetings of clubs or organizations: Never    Relationship status: Married  Other Topics Concern  . Not on file  Social History Narrative  . Not on file    Outpatient Encounter Medications as of 01/24/2018  Medication Sig  . aspirin 81 MG tablet Take 1 tablet by mouth daily.  Marland Kitchen atorvastatin (LIPITOR) 40 MG tablet Take 1 tablet (40 mg total) by mouth daily at 6 PM.  . cloNIDine (CATAPRES) 0.1 MG tablet Take 0.1 mg by mouth 2 (two) times daily.  Marland Kitchen gabapentin (NEURONTIN) 400 MG capsule Take 2 capsules (800 mg total) by mouth 3 (three) times daily.  Marland Kitchen glucose blood test strip Use as instructed  . hydrochlorothiazide (HYDRODIURIL) 25 MG tablet Take 1 tablet (25 mg total) by mouth daily.  . metFORMIN (GLUCOPHAGE-XR) 500 MG 24 hr tablet Take 1 tablet (500 mg total) by mouth 2 (two) times daily.  . Omega-3 Fatty Acids (FISH OIL) 1000 MG CAPS Take 2 capsules by mouth daily.  Marland Kitchen omeprazole (PRILOSEC) 40 MG capsule Take 1 capsule (40 mg total) by mouth daily.  Marland Kitchen telmisartan (MICARDIS) 40 MG tablet Take 1 tablet (40 mg total) by mouth daily.  . traZODone (DESYREL) 100 MG tablet Take 1 tablet (100 mg total) by mouth at bedtime as needed for sleep.  Marland Kitchen umeclidinium-vilanterol (ANORO ELLIPTA) 62.5-25 MCG/INH AEPB Inhale 1 puff into the lungs daily.  Marland Kitchen albuterol (PROVENTIL HFA;VENTOLIN HFA) 108 (90 Base) MCG/ACT inhaler Inhale 1-2 puffs into the lungs  every 6 (six) hours as needed for wheezing or shortness of breath. (Patient not taking: Reported on 01/24/2018)  . baclofen (LIORESAL) 10 MG tablet Take 1 tablet (10 mg total) by mouth 2 (two) times daily. (Patient not taking: Reported on 01/24/2018)  . lidocaine (LIDODERM) 5 % Place 1 patch onto the skin daily. Remove & Discard patch within 12 hours or as directed by MD (Patient not taking: Reported on 01/24/2018)   No facility-administered encounter medications  on file as of 01/24/2018.     Activities of Daily Living In your present state of health, do you have any difficulty performing the following activities: 01/24/2018 08/30/2017  Hearing? N N  Vision? N Y  Comment - reading glasses  Difficulty concentrating or making decisions? Y N  Walking or climbing stairs? N N  Dressing or bathing? N N  Doing errands, shopping? N N  Preparing Food and eating ? N -  Using the Toilet? N -  In the past six months, have you accidently leaked urine? N -  Do you have problems with loss of bowel control? N -  Managing your Medications? N -  Managing your Finances? N -  Housekeeping or managing your Housekeeping? N -  Some recent data might be hidden    Patient Care Team: Mikey College, NP as PCP - General (Nurse Practitioner) Anell Barr, OD as Consulting Physician (Optometry) Dionisio David, MD as Consulting Physician (Cardiology)   Assessment:   This is a routine wellness examination for [employer].  Exercise Activities and Dietary recommendations Current Exercise Habits: The patient does not participate in regular exercise at present, Exercise limited by: None identified  Goals    . DIET - INCREASE WATER INTAKE     Recommend drinking at least 6-8 glasses of water a day        Fall Risk Fall Risk  01/24/2018 08/30/2017 08/11/2017 06/16/2017 03/24/2017  Falls in the past year? No No No No No   Is the patient's home free of loose throw rugs in walkways, pet beds, electrical cords, etc?    yes      Grab bars in the bathroom? yes      Handrails on the stairs?   no stairs       Adequate lighting?   yes  Timed Get Up and Go Performed: Completed in 8 seconds with no use of assistive devices, steady gait. No intervention needed at this time.   Depression Screen PHQ 2/9 Scores 01/24/2018 10/03/2017 10/03/2017 08/30/2017  PHQ - 2 Score 0 4 0 0  PHQ- 9 Score - 13 - -    Cognitive Function     6CIT Screen 01/24/2018 01/17/2017  What Year? 0 points 0 points  What month? 0 points 0 points  What time? 0 points 0 points  Count back from 20 0 points 0 points  Months in reverse 0 points 4 points  Repeat phrase 0 points 2 points  Total Score 0 6    Immunization History  Administered Date(s) Administered  . Influenza, High Dose Seasonal PF 07/17/2015, 07/29/2016, 07/15/2017  . Influenza, Seasonal, Injecte, Preservative Fre 08/14/2008  . Influenza,inj,Quad PF,6+ Mos 07/20/2013, 06/20/2014  . Pneumococcal Conjugate-13 08/15/2014  . Pneumococcal Polysaccharide-23 05/11/2012  . Tdap 05/29/2014    Qualifies for Shingles Vaccine? Yes, discussed shingrix vaccine   Screening Tests Health Maintenance  Topic Date Due  . OPHTHALMOLOGY EXAM  02/21/1956  . FOOT EXAM  04/15/2016  . INFLUENZA VACCINE  05/18/2018  . HEMOGLOBIN A1C  05/29/2018  . TETANUS/TDAP  05/29/2024  . COLONOSCOPY  01/29/2027  . Hepatitis C Screening  Completed  . PNA vac Low Risk Adult  Completed   Cancer Screenings: Lung: Low Dose CT Chest recommended if Age 30-80 years, 30 pack-year currently smoking OR have quit w/in 15years. Patient does not qualify. Colorectal: completed 01/28/2017  Additional Screenings:  Hepatitis C Screening: completed 01/17/2017      Plan:    I have  personally reviewed and addressed the Medicare Annual Wellness questionnaire and have noted the following in the patient's chart:  A. Medical and social history B. Use of alcohol, tobacco or illicit drugs  C. Current medications and  supplements D. Functional ability and status E.  Nutritional status F.  Physical activity G. Advance directives H. List of other physicians I.  Hospitalizations, surgeries, and ER visits in previous 12 months J.  [place] such as hearing and vision if needed, cognitive and depression L. Referrals and appointments   In addition, I have reviewed and discussed with patient certain preventive protocols, quality metrics, and best practice recommendations. A written personalized care plan for preventive services as well as general preventive health recommendations were provided to patient.   Signed,  Tyler Aas, LPN Nurse Health Advisor   Nurse Notes:due for diabetic foot exam.   Patient will make an appt with Dr.Woodard for diabetic eye exam.

## 2018-03-08 ENCOUNTER — Other Ambulatory Visit: Payer: Self-pay | Admitting: Nurse Practitioner

## 2018-03-08 ENCOUNTER — Other Ambulatory Visit: Payer: Self-pay

## 2018-03-08 DIAGNOSIS — I10 Essential (primary) hypertension: Secondary | ICD-10-CM

## 2018-03-08 NOTE — Telephone Encounter (Signed)
Sent by pharmacy request

## 2018-03-08 NOTE — Telephone Encounter (Signed)
Reill of Clonidine 0.1mg  to [facility]

## 2018-03-10 ENCOUNTER — Ambulatory Visit (INDEPENDENT_AMBULATORY_CARE_PROVIDER_SITE_OTHER): Payer: Medicare HMO | Admitting: Family Medicine

## 2018-03-10 ENCOUNTER — Encounter: Payer: Self-pay | Admitting: Family Medicine

## 2018-03-10 VITALS — BP 120/70 | HR 63 | Temp 98.6°F | Resp 16 | Ht 70.0 in | Wt 220.0 lb

## 2018-03-10 DIAGNOSIS — G4733 Obstructive sleep apnea (adult) (pediatric): Secondary | ICD-10-CM | POA: Insufficient documentation

## 2018-03-10 DIAGNOSIS — I252 Old myocardial infarction: Secondary | ICD-10-CM | POA: Diagnosis not present

## 2018-03-10 DIAGNOSIS — F1120 Opioid dependence, uncomplicated: Secondary | ICD-10-CM

## 2018-03-10 DIAGNOSIS — G8929 Other chronic pain: Secondary | ICD-10-CM

## 2018-03-10 DIAGNOSIS — I10 Essential (primary) hypertension: Secondary | ICD-10-CM | POA: Diagnosis not present

## 2018-03-10 DIAGNOSIS — M549 Dorsalgia, unspecified: Secondary | ICD-10-CM

## 2018-03-10 DIAGNOSIS — F419 Anxiety disorder, unspecified: Secondary | ICD-10-CM

## 2018-03-10 DIAGNOSIS — I2510 Atherosclerotic heart disease of native coronary artery without angina pectoris: Secondary | ICD-10-CM | POA: Diagnosis not present

## 2018-03-10 DIAGNOSIS — E785 Hyperlipidemia, unspecified: Secondary | ICD-10-CM

## 2018-03-10 DIAGNOSIS — M542 Cervicalgia: Secondary | ICD-10-CM

## 2018-03-10 DIAGNOSIS — F5105 Insomnia due to other mental disorder: Secondary | ICD-10-CM

## 2018-03-10 DIAGNOSIS — K219 Gastro-esophageal reflux disease without esophagitis: Secondary | ICD-10-CM | POA: Diagnosis not present

## 2018-03-10 DIAGNOSIS — Z951 Presence of aortocoronary bypass graft: Secondary | ICD-10-CM | POA: Diagnosis not present

## 2018-03-10 DIAGNOSIS — E119 Type 2 diabetes mellitus without complications: Secondary | ICD-10-CM

## 2018-03-10 DIAGNOSIS — F99 Mental disorder, not otherwise specified: Secondary | ICD-10-CM

## 2018-03-10 MED ORDER — TRAZODONE HCL 150 MG PO TABS
150.0000 mg | ORAL_TABLET | Freq: Every day | ORAL | 5 refills | Status: DC
Start: 2018-03-10 — End: 2018-08-30

## 2018-03-10 NOTE — Assessment & Plan Note (Signed)
Well controlled Continue current meds Reviewed recent CMP

## 2018-03-10 NOTE — Assessment & Plan Note (Signed)
Patient with chronic neck and back pain Doing well on gabapentin at current dose, which we will continue Patient had previously taken baclofen, meloxicam, opioids Avoid opioids given history of misuse and withdrawal symptoms

## 2018-03-10 NOTE — Assessment & Plan Note (Signed)
S/p CABG and multiple stents Emphasized importance of regular f/u with Cardiology

## 2018-03-10 NOTE — Assessment & Plan Note (Signed)
Likely multifactorial Sleep awakenings could be related to his lack of CPAP use and uncontrolled sleep apnea Trazodone has helped some, so we will increase the dose to 150 mg nightly Avoid benzos and other controlled substances Encouraged good sleep hygiene practices

## 2018-03-10 NOTE — Assessment & Plan Note (Signed)
Asymptomatic and well controlled Continue omeprazole

## 2018-03-10 NOTE — Progress Notes (Signed)
Patient: Juan Gist., Male    DOB: [birth year], 72 y.o.   MRN: [id number] Visit Date: 03/10/2018  Today's Provider: [name], MD   I, Martha Clan, CMA, am acting as scribe for [name], MD.  Chief Complaint  Patient presents with  . New Patient (Initial Visit)   Subjective:    New Patient Juan Le. is a 72 y.o. male who presents today to establish care. He feels well. He reports he is no longer exercising. He reports he is sleeping Le well. He is currently taking Trazodone for this, without much relief. He was previously taking Ambien, but this became ineffective after taking building a tolerance to this.  His PMH includes diabetes, HTN, mitral insufficiency, anxiety, dyslipidemia, insomnia, GERD, COPD and chronic pain.   COPD, OSA: states that he was referral to get CPAP fitting and he did not go.  States that wife has one and he is scared to wear one because it sounds weird when breathing with it on.  Per last note by Juan Le ([facility]), Sleep study 04/27/17; AHI equals 24. Started on AutoPap 8-14.  COPD is from 2nd hand smoke.  Wife smokes, he does not smoke.  He is controlling wife's cigarettes.  Previously declines PFTs and pulm rehab.  Taking Anoro regularly.  Feeling well. No recent exacerbations.  Has never needed to use rescue inhaler. No h/o hospitalizations or intubation.  Anxiety: Not currently on medications.  Was previously on Klonopin  GERD: Prilosec.  Symptoms well controlled.  DJD of C and L spine, chronic pain: Previously using opioids, which became concerning for dependence.  No longer on any controlled substances. Was originally taking gabapentin after shingles outbreak and found that it helped back and neck pain.  Now taking 800mg  TID.  H/o MI in 1997 s/p CABG. Followed by Juan Le intermittently. Thinks it has been 5-6 years since last visit. S/p multiple stents by Juan Le  HTN: - Medications: telmisartan 40mg   daily, clonidine 0.1mg  BID - Compliance: good - Checking BP at home: no - Denies any SOB, CP, vision changes, LE edema, medication SEs, or symptoms of hypotension  HLD - medications: Lipitor 40mg  daily - compliance: good - medication SEs: none Also taking ASA daily  T2DM - Checking BG at home: yes fasting ~150 - Medications: Metformin 500mg  BID - Compliance: good - eye exam: "Been a while" - foot exam: needs - microalbumin: n/a ARB   -----------------------------------------------------------------   Review of Systems  Constitutional: Positive for activity change. Negative for appetite change, chills, diaphoresis, fatigue, fever and unexpected weight change.  HENT: Negative.   Eyes: Negative.   Respiratory: Negative.   Cardiovascular: Negative.   Gastrointestinal: Negative.   Endocrine: Positive for polyuria. Negative for cold intolerance, heat intolerance, polydipsia and polyphagia.  Genitourinary: Negative.   Musculoskeletal: Positive for back pain and neck pain. Negative for arthralgias, gait problem, joint swelling, myalgias and neck stiffness.  Skin: Negative.   Allergic/Immunologic: Negative.   Neurological: Negative.   Hematological: Negative.   Psychiatric/Behavioral: Negative.     Social History      He  reports that he has quit smoking. His smoking use included cigarettes. He has never used smokeless tobacco. He reports that he does not drink alcohol or use drugs.       Social History   Socioeconomic History  . Marital status: Married    Spouse name: Not on file  . Number of children: 2  .  Years of education: 42  . Highest education level: High school graduate  Occupational History  . Occupation: Retired  Scientific laboratory technician  . Financial resource strain: Not hard at all  . Food insecurity:    Worry: Never true    Inability: Never true  . Transportation needs:    Medical: No    Non-medical: No  Tobacco Use  . Smoking status: Former Smoker    Types:  Cigarettes  . Smokeless tobacco: Never Used  . Tobacco comment: former light smoker; quit in his teens  Substance and Sexual Activity  . Alcohol use: No    Alcohol/week: 0.0 oz  . Drug use: No  . Sexual activity: Yes    Partners: Female  Lifestyle  . Physical activity:    Days per week: 0 days    Minutes per session: 0 min  . Stress: Not at all  Relationships  . Social connections:    Talks on phone: More than three times a week    Gets together: Once a week    Attends religious service: Never    Active member of club or organization: No    Attends meetings of clubs or organizations: Never    Relationship status: Married  Other Topics Concern  . Not on file  Social History Narrative  . Not on file    Past Medical History:  Diagnosis Date  . Anxiety   . Atelectasis of left lung 03/25/2017  . Chronic, continuous use of opioids   . COPD (chronic obstructive pulmonary disease) ([place])   . Degenerative disc disease, lumbar   . Diabetes ([place])   . DJD (degenerative joint disease)   . GERD (gastroesophageal reflux disease)   . Heart attack ([place])   . History of diverticulitis   . Hypertension   . Hypertension   . Hypertriglyceridemia   . Hypokalemia   . Insomnia   . Mitral regurgitation   . Sleep apnea   . Vitamin D deficiency   . Wears dentures    full upper     Patient Active Problem List   Diagnosis Date Noted  . Shortness of breath on exertion 03/25/2017  . Hx of colonic polyps   . Benign neoplasm of ascending colon   . Benign neoplasm of descending colon   . Atherosclerosis of coronary artery 01/20/2017  . DDD (degenerative disc disease), lumbar 01/20/2017  . Dermatitis, eczematoid 01/20/2017  . Cervical spinal stenosis 01/20/2017  . MI (mitral incompetence) 01/20/2017  . Lesion of tongue 01/20/2017  . Acute bilateral low back pain with right-sided sciatica 05/06/2016  . Osteoarthritis of right foot 04/16/2016  . Elevated liver enzymes 02/05/2016  . Chronic  neck and back pain 08/13/2015  . GERD (gastroesophageal reflux disease) 06/12/2015  . Hypertriglyceridemia 06/12/2015  . Hypertension 06/12/2015  . Numbness in feet 06/12/2015  . Nocturnal leg cramps 05/12/2015  . Type 2 diabetes mellitus without complications ([place]) 36/14/4315  . Dyslipidemia 04/16/2015  . Acid reflux 04/16/2015  . BP (high blood pressure) 04/16/2015  . Insomnia 04/16/2015  . Long term current use of opiate analgesic 04/16/2015  . H/O coronary artery bypass surgery 04/16/2015  . Anxiety 04/11/2015  . Continuous opioid dependence ([place]) 04/11/2015  . Chronic neck pain 04/11/2015  . DDD (degenerative disc disease), cervical 04/11/2015  . H/O adenomatous polyp of colon 06/10/2011  . Diverticulitis 06/10/2011    Past Surgical History:  Procedure Laterality Date  . COLONOSCOPY WITH PROPOFOL N/A 01/28/2017   Procedure: COLONOSCOPY WITH PROPOFOL;  Surgeon: [name], MD;  Location: [facility];  Service: Endoscopy;  Laterality: N/A;  diabetic - oral meds  . CORONARY ARTERY BYPASS GRAFT  1997  . CORONARY STENT PLACEMENT  11/2006  . FOOT SURGERY  2003  . HERNIA REPAIR    . POLYPECTOMY N/A 01/28/2017   Procedure: POLYPECTOMY;  Surgeon: [name], MD;  Location: [facility];  Service: Endoscopy;  Laterality: N/A;  . UMBILICAL HERNIA REPAIR  04/2012    Family History        Family Status  Relation Name Status  . Father  Deceased  . Brother  Alive  . Mother  Deceased  . Ethlyn Daniels  (Not Specified)  . Sister  Alive  . MGM  (Not Specified)  . Brother  Deceased  . Brother  Deceased  . Neg Hx  (Not Specified)        His family history includes Arthritis in his brother; Bone cancer in his brother; Breast cancer in his paternal aunt; Cancer in his father; Colon cancer in his father; Depression in his father and sister; Heart disease in his father and mother; Hypertension in his father; Kidney disease in his mother; Kidney failure in his mother; Parkinson's  disease in his father; Stroke in his brother, maternal grandmother, and mother; Throat cancer in his father. There is no history of Prostate cancer.      Allergies  Allergen Reactions  . Ace Inhibitors Cough  . Diphenhydramine     stomach upset     Current Outpatient Medications:  .  aspirin 81 MG tablet, Take 1 tablet by mouth daily., Disp: , Rfl:  .  atorvastatin (LIPITOR) 40 MG tablet, Take 1 tablet (40 mg total) by mouth daily at 6 PM., Disp: 90 tablet, Rfl: 1 .  cloNIDine (CATAPRES) 0.1 MG tablet, Take 1 tablet (0.1 mg total) by mouth 2 (two) times daily., Disp: 60 tablet, Rfl: 0 .  gabapentin (NEURONTIN) 400 MG capsule, Take 2 capsules (800 mg total) by mouth 3 (three) times daily., Disp: 120 capsule, Rfl: 2 .  glucose blood test strip, Use as instructed, Disp: 50 each, Rfl: 12 .  metFORMIN (GLUCOPHAGE-XR) 500 MG 24 hr tablet, Take 1 tablet (500 mg total) by mouth 2 (two) times daily., Disp: 180 tablet, Rfl: 1 .  Omega-3 Fatty Acids (FISH OIL) 1000 MG CAPS, Take 2 capsules by mouth daily., Disp: , Rfl:  .  omeprazole (PRILOSEC) 40 MG capsule, Take 1 capsule (40 mg total) by mouth daily., Disp: 90 capsule, Rfl: 1 .  telmisartan (MICARDIS) 40 MG tablet, Take 1 tablet (40 mg total) by mouth daily., Disp: 90 tablet, Rfl: 1 .  traZODone (DESYREL) 100 MG tablet, Take 1 tablet (100 mg total) by mouth at bedtime as needed for sleep., Disp: 30 tablet, Rfl: 2 .  umeclidinium-vilanterol (ANORO ELLIPTA) 62.5-25 MCG/INH AEPB, Inhale 1 puff into the lungs daily., Disp: 1 each, Rfl: 0 .  albuterol (PROVENTIL HFA;VENTOLIN HFA) 108 (90 Base) MCG/ACT inhaler, Inhale 1-2 puffs into the lungs every 6 (six) hours as needed for wheezing or shortness of breath. (Patient not taking: Reported on 01/24/2018), Disp: 1 Inhaler, Rfl: 5   Patient Care Team: [name], MD as PCP - General (Family Medicine) Anell Barr, [place] as Consulting Physician (Optometry) Dionisio David, MD as Consulting Physician  (Cardiology)      Objective:   Vitals: BP 120/70 (BP Location: Left Arm, Patient Position: Sitting, Cuff Size: Large)   Pulse 63  Temp 98.6 F (37 C) (Oral)   Resp 16   Ht 5\' 10"  (1.778 m)   Wt 220 lb (99.8 kg)   SpO2 94%   BMI 31.57 kg/m    Vitals:   03/10/18 1357  BP: 120/70  Pulse: 63  Resp: 16  Temp: 98.6 F (37 C)  TempSrc: Oral  SpO2: 94%  Weight: 220 lb (99.8 kg)  Height: 5\' 10"  (1.778 m)      Physical Exam  Constitutional: He is oriented to person, place, and time. He appears well-developed and well-nourished. No distress.  HENT:  Head: Normocephalic and atraumatic.  Right Ear: External ear normal.  Left Ear: External ear normal.  Nose: Nose normal.  Mouth/Throat: Oropharynx is clear and moist.  Eyes: Pupils are equal, round, and reactive to light. Conjunctivae and EOM are normal. No scleral icterus.  Neck: Neck supple. No thyromegaly present.  Cardiovascular: Normal rate, regular rhythm, normal heart sounds and intact distal pulses.  No murmur heard. Pulmonary/Chest: Breath sounds normal. No respiratory distress. He has no wheezes. He has no rales.  Abdominal: Soft. Bowel sounds are normal. He exhibits no distension. There is no tenderness. There is no rebound and no guarding.  Musculoskeletal: He exhibits no edema or deformity.  Lymphadenopathy:    He has no cervical adenopathy.  Neurological: He is alert and oriented to person, place, and time.  Skin: Skin is warm and dry. Capillary refill takes less than 2 seconds. No rash noted.  Psychiatric: He has a normal mood and affect. His behavior is normal.  Vitals reviewed.   Depression Screen PHQ 2/9 Scores 01/24/2018 10/03/2017 10/03/2017 08/30/2017  PHQ - 2 Score 0 4 0 0  PHQ- 9 Score - 13 - -     Review of labs from 11/2017 shows good control of Hgb A1c, lipids. Normal CMP.  Assessment & Plan:    Problem List Items Addressed This Visit      Cardiovascular and Mediastinum   Hypertension     Well controlled Continue current meds Reviewed recent CMP      Atherosclerosis of coronary artery    S/p CABG and multiple stents Emphasized importance of regular f/u with Cardiology        Respiratory   Obstructive sleep apnea    Uncontrolled With nonrestorative sleep and nighttime awakenings Suspect that his insomnia is related to his untreated sleep apnea Stressed the importance of CPAP use Discussed potential health risks of uncontrolled sleep apnea        Digestive   GERD (gastroesophageal reflux disease)    Asymptomatic and well controlled Continue omeprazole        Endocrine   Type 2 diabetes mellitus without complications ([place])    Well-controlled with last A1c 5.4 Continue metformin at current dose Foot exam today Advised getting eye exam and having records sent to our office Up-to-date on vaccines On ARB Follow-up in 3 months and repeat A1c        Other   Anxiety    Reports moderately controlled anxiety Was previously on clonazepam This was tapered off for the last several months and he is done well without it Continue trazodone at bedtime      Relevant Medications   traZODone (DESYREL) 150 MG tablet   Continuous opioid dependence ([place])    Patient with history of opioid misuse and withdrawal symptoms Not currently using any opioids Will not plan to treat with opioids future Likely will also avoid other controlled substances  Dyslipidemia    Patient with goal LDL less than 70 given history of MI, CAD, CABG This was well-controlled on last check Continue Lipitor at current dose      Insomnia - Primary    Likely multifactorial Sleep awakenings could be related to his lack of CPAP use and uncontrolled sleep apnea Trazodone has helped some, so we will increase the dose to 150 mg nightly Avoid benzos and other controlled substances Encouraged good sleep hygiene practices      Relevant Medications   traZODone (DESYREL) 150 MG tablet   H/O  coronary artery bypass surgery    Patient with history of MI status post CABG Discussed importance of risk factor control      Chronic neck and back pain    Patient with chronic neck and back pain Doing well on gabapentin at current dose, which we will continue Patient had previously taken baclofen, meloxicam, opioids Avoid opioids given history of misuse and withdrawal symptoms      Relevant Medications   traZODone (DESYREL) 150 MG tablet   History of acute myocardial infarction    As above, patient with history of MI status post CABG and CAD status post stenting Needs to have regular follow-up with cardiology Needs good risk factor control Continue aspirin daily          Return in about 3 months (around 06/10/2018) for insomnia, HTN, diabetes f/u.   The entirety of the information documented in the History of Present Illness, Review of Systems and Physical Exam were personally obtained by me. Portions of this information were initially documented by Raquel Sarna Ratchford, CMA and reviewed by me for thoroughness and accuracy.    [name], MD, MPH [facility] 03/10/2018 3:24 PM

## 2018-03-10 NOTE — Assessment & Plan Note (Signed)
Uncontrolled With nonrestorative sleep and nighttime awakenings Suspect that his insomnia is related to his untreated sleep apnea Stressed the importance of CPAP use Discussed potential health risks of uncontrolled sleep apnea

## 2018-03-10 NOTE — Assessment & Plan Note (Signed)
Reports moderately controlled anxiety Was previously on clonazepam This was tapered off for the last several months and he is done well without it Continue trazodone at bedtime

## 2018-03-10 NOTE — Assessment & Plan Note (Signed)
Patient with history of opioid misuse and withdrawal symptoms Not currently using any opioids Will not plan to treat with opioids future Likely will also avoid other controlled substances

## 2018-03-10 NOTE — Assessment & Plan Note (Signed)
As above, patient with history of MI status post CABG and CAD status post stenting Needs to have regular follow-up with cardiology Needs good risk factor control Continue aspirin daily

## 2018-03-10 NOTE — Assessment & Plan Note (Signed)
Patient with goal LDL less than 70 given history of MI, CAD, CABG This was well-controlled on last check Continue Lipitor at current dose

## 2018-03-10 NOTE — Assessment & Plan Note (Signed)
Well-controlled with last A1c 5.4 Continue metformin at current dose Foot exam today Advised getting eye exam and having records sent to our office Up-to-date on vaccines On ARB Follow-up in 3 months and repeat A1c

## 2018-03-10 NOTE — Assessment & Plan Note (Signed)
Patient with history of MI status post CABG Discussed importance of risk factor control

## 2018-03-24 ENCOUNTER — Other Ambulatory Visit: Payer: Self-pay | Admitting: Family Medicine

## 2018-03-24 DIAGNOSIS — E785 Hyperlipidemia, unspecified: Secondary | ICD-10-CM

## 2018-03-24 DIAGNOSIS — I10 Essential (primary) hypertension: Secondary | ICD-10-CM

## 2018-03-24 MED ORDER — ATORVASTATIN CALCIUM 40 MG PO TABS: 40.0000 mg | ORAL_TABLET | Freq: Every day | ORAL | 1 refills | Status: DC

## 2018-03-24 MED ORDER — TELMISARTAN 40 MG PO TABS: 40.0000 mg | ORAL_TABLET | Freq: Every day | ORAL | 1 refills | Status: DC

## 2018-03-24 NOTE — Telephone Encounter (Signed)
LOV 03/10/2018. Please review.

## 2018-03-24 NOTE — Telephone Encounter (Signed)
Refills on   Atorvastatin 40 mg  Telmisartan 40 mg  He uses [facility]

## 2018-03-24 NOTE — Telephone Encounter (Signed)
Pt called again for these refills. Thanks.

## 2018-03-30 ENCOUNTER — Other Ambulatory Visit: Payer: Self-pay | Admitting: Family Medicine

## 2018-03-30 ENCOUNTER — Ambulatory Visit (INDEPENDENT_AMBULATORY_CARE_PROVIDER_SITE_OTHER): Payer: Medicare HMO | Admitting: Family Medicine

## 2018-03-30 ENCOUNTER — Ambulatory Visit
Admission: RE | Admit: 2018-03-30 | Discharge: 2018-03-30 | Disposition: A | Discharge status: Home / Self Care | Payer: Medicare HMO | Source: Ambulatory Visit | Attending: Family Medicine | Admitting: Family Medicine

## 2018-03-30 ENCOUNTER — Telehealth: Payer: Self-pay

## 2018-03-30 VITALS — BP 120/68 | HR 75 | Temp 98.2°F | Resp 16 | Wt 222.0 lb

## 2018-03-30 DIAGNOSIS — N400 Enlarged prostate without lower urinary tract symptoms: Secondary | ICD-10-CM

## 2018-03-30 DIAGNOSIS — R109 Unspecified abdominal pain: Secondary | ICD-10-CM

## 2018-03-30 DIAGNOSIS — R3129 Other microscopic hematuria: Secondary | ICD-10-CM

## 2018-03-30 DIAGNOSIS — K5730 Diverticulosis of large intestine without perforation or abscess without bleeding: Secondary | ICD-10-CM | POA: Diagnosis not present

## 2018-03-30 DIAGNOSIS — I700 Atherosclerosis of aorta: Secondary | ICD-10-CM | POA: Insufficient documentation

## 2018-03-30 LAB — POCT URINALYSIS DIPSTICK
APPEARANCE: NORMAL
Bilirubin, UA: NEGATIVE
Glucose, UA: NEGATIVE
Ketones, UA: NEGATIVE
LEUKOCYTES UA: NEGATIVE
NITRITE UA: NEGATIVE
ODOR: NORMAL
PH UA: 5 (ref 5.0–8.0)
PROTEIN UA: NEGATIVE
Spec Grav, UA: >=1.03 — AB (ref 1.010–1.025)
UROBILINOGEN UA: 0.2 U/dL

## 2018-03-30 MED ORDER — TAMSULOSIN HCL 0.4 MG PO CAPS: 0.4000 mg | ORAL_CAPSULE | Freq: Every day | ORAL | 0 refills | Status: DC

## 2018-03-30 MED ORDER — HYDROCODONE-ACETAMINOPHEN 5-325 MG PO TABS: 1.0000 | ORAL_TABLET | Freq: Four times a day (QID) | ORAL | 0 refills | Status: DC | PRN

## 2018-03-30 NOTE — Patient Instructions (Signed)

## 2018-03-30 NOTE — Progress Notes (Signed)
Patient: Juan Le. Male    DOB: 04-Nov-1945   72 y.o.   MRN: [id number] Visit Date: 03/30/2018  Today's Provider: [name], MD   I, Martha Clan, CMA, am acting as scribe for [name], MD.  Chief Complaint  Patient presents with  . Back Pain   Subjective:    Back Pain  This is a new problem. Episode onset: x 2 days, was intermittent and is now constant. The problem occurs constantly. The problem has been gradually worsening since onset. Pain location: right side. The pain does not radiate. The pain is severe. Exacerbated by: nothing. Associated symptoms include weakness. Pertinent negatives include no abdominal pain, bladder incontinence, bowel incontinence, chest pain, dysuria, fever, headaches, leg pain, numbness, paresthesias, pelvic pain or tingling. He has tried nothing for the symptoms.   Did have mild to moderate intermittent pain 2 days ago.  Pain woke him in the middle of the night last night.  Now severe and constant.  Does not change with position or eating.  No dysuria, urinary frequency/urgency.  Has no history of kidney stones.    Allergies  Allergen Reactions  . Ace Inhibitors Cough  . Diphenhydramine     stomach upset     Current Outpatient Medications:  .  aspirin 81 MG tablet, Take 1 tablet by mouth daily., Disp: , Rfl:  .  atorvastatin (LIPITOR) 40 MG tablet, Take 1 tablet (40 mg total) by mouth daily at 6 PM., Disp: 90 tablet, Rfl: 1 .  cloNIDine (CATAPRES) 0.1 MG tablet, Take 1 tablet (0.1 mg total) by mouth 2 (two) times daily., Disp: 60 tablet, Rfl: 0 .  gabapentin (NEURONTIN) 400 MG capsule, Take 2 capsules (800 mg total) by mouth 3 (three) times daily., Disp: 120 capsule, Rfl: 2 .  glucose blood test strip, Use as instructed, Disp: 50 each, Rfl: 12 .  metFORMIN (GLUCOPHAGE-XR) 500 MG 24 hr tablet, Take 1 tablet (500 mg total) by mouth 2 (two) times daily., Disp: 180 tablet, Rfl: 1 .  Omega-3 Fatty Acids (FISH OIL) 1000 MG  CAPS, Take 2 capsules by mouth daily., Disp: , Rfl:  .  omeprazole (PRILOSEC) 40 MG capsule, Take 1 capsule (40 mg total) by mouth daily., Disp: 90 capsule, Rfl: 1 .  telmisartan (MICARDIS) 40 MG tablet, Take 1 tablet (40 mg total) by mouth daily., Disp: 90 tablet, Rfl: 1 .  traZODone (DESYREL) 150 MG tablet, Take 1 tablet (150 mg total) by mouth at bedtime., Disp: 30 tablet, Rfl: 5 .  umeclidinium-vilanterol (ANORO ELLIPTA) 62.5-25 MCG/INH AEPB, Inhale 1 puff into the lungs daily., Disp: 1 each, Rfl: 0 .  albuterol (PROVENTIL HFA;VENTOLIN HFA) 108 (90 Base) MCG/ACT inhaler, Inhale 1-2 puffs into the lungs every 6 (six) hours as needed for wheezing or shortness of breath. (Patient not taking: Reported on 01/24/2018), Disp: 1 Inhaler, Rfl: 5  Review of Systems  Constitutional: Negative for fever.  Cardiovascular: Negative for chest pain.  Gastrointestinal: Negative for abdominal pain and bowel incontinence.  Genitourinary: Negative for bladder incontinence, dysuria and pelvic pain.  Musculoskeletal: Positive for back pain.  Neurological: Positive for weakness. Negative for tingling, numbness, headaches and paresthesias.    Social History   Tobacco Use  . Smoking status: Former Smoker    Types: Cigarettes  . Smokeless tobacco: Never Used  . Tobacco comment: former light smoker; quit in his teens  Substance Use Topics  . Alcohol use: No    Alcohol/week: 0.0 oz  Objective:   BP 120/68 (BP Location: Left Arm, Patient Position: Sitting, Cuff Size: Large)   Pulse 75   Temp 98.2 F (36.8 C) (Oral)   Resp 16   Wt 222 lb (100.7 kg)   SpO2 99%   BMI 31.85 kg/m  Vitals:   03/30/18 1335  BP: 120/68  Pulse: 75  Resp: 16  Temp: 98.2 F (36.8 C)  TempSrc: Oral  SpO2: 99%  Weight: 222 lb (100.7 kg)     Physical Exam  Constitutional: He is oriented to person, place, and time. He appears well-developed and well-nourished. He appears distressed (tearful, writhing in pain  intermittently).  HENT:  Head: Normocephalic and atraumatic.  Eyes: Conjunctivae are normal. No scleral icterus.  Cardiovascular: Normal rate, regular rhythm, normal heart sounds and intact distal pulses.  No murmur heard. Pulmonary/Chest: Effort normal and breath sounds normal. No respiratory distress. He has no wheezes. He has no rales.  Abdominal: Soft. He exhibits no distension. There is no tenderness. There is CVA tenderness (R side, severe). There is no guarding.  Musculoskeletal: He exhibits no edema.  Neurological: He is alert and oriented to person, place, and time.  Skin: Skin is warm and dry. Capillary refill takes less than 2 seconds.  Psychiatric: He has a normal mood and affect. His behavior is normal.  Vitals reviewed.       Results for orders placed or performed in visit on 03/30/18  POCT urinalysis dipstick  Result Value Ref Range   Color, UA Dark Yellow    Clarity, UA clear    Glucose, UA Negative Negative   Bilirubin, UA Negative    Ketones, UA Negative    Spec Grav, UA >=1.030 (A) 1.010 - 1.025   Blood, UA NH Trace    pH, UA 5.0 5.0 - 8.0   Protein, UA Negative Negative   Urobilinogen, UA 0.2 0.2 or 1.0 E.U./dL   Nitrite, UA Negative    Leukocytes, UA Negative Negative   Appearance Normal    Odor Normal     Assessment & Plan:   1. Right flank pain 2. Abdominal pain, unspecified abdominal location 3. Other microscopic hematuria - Patient distressed and appears to be in a lot of pain - hematuria, unilateral flank pain, and CVA tenderness most likely c/w nephrolithiasis - no infectious symptoms and UA non-infectious, so doubt pyelonephritis or infected stone - given level of pain, am concerned for possibility that stone could be large or obstructive - obtain STAT CT Renal Stone study - 5d supply of Norco given to take prn - also start flomax to help with passage of stone - stay hydrated - discussed precautions for emergent re-eval - pending size of  stone on CT, consider sending to ED for emergent Urology eval if obstructing - CT RENAL STONE STUDY; Future   Meds ordered this encounter  Medications  . HYDROcodone-acetaminophen (NORCO/VICODIN) 5-325 MG tablet    Sig: Take 1 tablet by mouth every 6 (six) hours as needed for moderate pain or severe pain.    Dispense:  20 tablet    Refill:  0  . tamsulosin (FLOMAX) 0.4 MG CAPS capsule    Sig: Take 1 capsule (0.4 mg total) by mouth daily.    Dispense:  10 capsule    Refill:  0     Return if symptoms worsen or fail to improve.   The entirety of the information documented in the History of Present Illness, Review of Systems and Physical Exam were personally obtained  by me. Portions of this information were initially documented by Raquel Sarna Ratchford, CMA and reviewed by me for thoroughness and accuracy.    [name], MD, MPH [facility] 03/30/2018 2:03 PM

## 2018-03-30 NOTE — Telephone Encounter (Signed)
Called patient myself to discuss.  See result note.  [name], MD, MPH [facility] 03/30/2018 3:54 PM

## 2018-03-30 NOTE — Telephone Encounter (Signed)
Megan from [facility] CT called with a call reports for patient CT results. Report is in patient chart. Please review. Patient has been advised to go home and we would call with results.

## 2018-03-31 ENCOUNTER — Ambulatory Visit: Payer: Medicare HMO | Admitting: Nurse Practitioner

## 2018-04-03 ENCOUNTER — Other Ambulatory Visit: Payer: Self-pay | Admitting: Family Medicine

## 2018-04-03 DIAGNOSIS — M792 Neuralgia and neuritis, unspecified: Secondary | ICD-10-CM

## 2018-04-03 MED ORDER — GABAPENTIN 400 MG PO CAPS: 800.0000 mg | ORAL_CAPSULE | Freq: Three times a day (TID) | ORAL | 2 refills | Status: DC

## 2018-04-03 NOTE — Telephone Encounter (Signed)
Pt contacted office for refill request on the following medications:  gabapentin (NEURONTIN) 400 MG capsule   [facility]  Please advise. Thanks TNP

## 2018-04-04 ENCOUNTER — Other Ambulatory Visit: Payer: Self-pay | Admitting: Family Medicine

## 2018-04-04 DIAGNOSIS — I10 Essential (primary) hypertension: Secondary | ICD-10-CM

## 2018-04-04 MED ORDER — CLONIDINE HCL 0.1 MG PO TABS: 0.1000 mg | ORAL_TABLET | Freq: Two times a day (BID) | ORAL | 5 refills | Status: DC

## 2018-04-04 NOTE — Telephone Encounter (Signed)
Pt contacted office for refill request on the following medications:  cloNIDine (CATAPRES) 0.1 MG tablet   [facility]  Please advise. Thanks TNP

## 2018-04-11 NOTE — Progress Notes (Signed)
04/12/2018 12:12 PM   [name] June 08, 1946 [id number]  Referring provider: [name], MD [address] [place], [place] [address]  Chief Complaint  Patient presents with  . Benign Prostatic Hypertrophy    HPI: Patient is a 72 -year-old Caucasian male who presents today as a referral from [name] prostatomegaly.      He is having right upper back pain.  He describes it as a sharp pain.  The pain has been occurring for 3 weeks.  6/10 pain.  Nothing makes it worse.  Nothing makes it better.    Patient was found to have positive dips for blood at PCP's office.  He denies any gross hematuria.      He does not have a prior history of recurrent urinary tract infections, nephrolithiasis, trauma to the genitourinary tract, BPH or malignancies of the genitourinary tract.   His brother had kidney stones.  He does not have a family history of malignancies of the genitourinary tract or hematuria.   Today, he is having symptoms of frequent urination x 8-10 and nocturia x 3.  His UA today is negative.  PVR is 71 mL.    Patient denies any gross hematuria, dysuria or suprapubic/flank pain.  Patient denies any fevers, chills, nausea or vomiting.    He smoked as a teenager.  He is exposured to second hand smoke at his home.  Wife is a smoker. He has worked with chemicals in the past.  He has a high BMI.    CT Renal stone study on 03/30/2018 noted no nephrolithiasis or obstructive uropathy. Prostatomegaly invading base of bladder.  Colonic diverticulosis without acute diverticulitis nor acute intra-abdominal/pelvic process. Aortic Atherosclerosis. Small bilateral fat containing inguinal hernias. Status post umbilical hernia repair with mesh. Severe L5-S1 spondylosis resulting in severe neural foraminal narrowing. Moderate to severe L4-5 neural foraminal narrowing   PMH: Past Medical History:  Diagnosis Date  . Anxiety   . Atelectasis of left lung  03/25/2017  . Chronic, continuous use of opioids   . COPD (chronic obstructive pulmonary disease) ([place])   . Degenerative disc disease, lumbar   . Diabetes ([place])   . DJD (degenerative joint disease)   . GERD (gastroesophageal reflux disease)   . Heart attack ([place])   . History of diverticulitis   . Hypertension   . Hypertension   . Hypertriglyceridemia   . Hypokalemia   . Insomnia   . Mitral regurgitation   . Sleep apnea   . Vitamin D deficiency   . Wears dentures    full upper    Surgical History: Past Surgical History:  Procedure Laterality Date  . COLONOSCOPY WITH PROPOFOL N/A 01/28/2017   Procedure: COLONOSCOPY WITH PROPOFOL;  Surgeon: [name], MD;  Location: [facility];  Service: Endoscopy;  Laterality: N/A;  diabetic - oral meds  . CORONARY ARTERY BYPASS GRAFT  1997  . CORONARY STENT PLACEMENT  11/2006  . FOOT SURGERY  2003  . HERNIA REPAIR    . POLYPECTOMY N/A 01/28/2017   Procedure: POLYPECTOMY;  Surgeon: [name], MD;  Location: [facility];  Service: Endoscopy;  Laterality: N/A;  . UMBILICAL HERNIA REPAIR  04/2012    Home Medications:  Allergies as of 04/12/2018      Reactions   Ace Inhibitors Cough   Diphenhydramine    stomach upset      Medication List        Accurate as of 04/12/18 12:12 PM.  Always use your most recent med list.          aspirin 81 MG tablet Take 1 tablet by mouth daily.   atorvastatin 40 MG tablet Commonly known as:  LIPITOR Take 1 tablet (40 mg total) by mouth daily at 6 PM.   cloNIDine 0.1 MG tablet Commonly known as:  CATAPRES Take 1 tablet (0.1 mg total) by mouth 2 (two) times daily.   Fish Oil 1000 MG Caps Take 2 capsules by mouth daily.   gabapentin 400 MG capsule Commonly known as:  NEURONTIN Take 2 capsules (800 mg total) by mouth 3 (three) times daily.   glucose blood test strip Use as instructed   HYDROcodone-acetaminophen 5-325 MG tablet Commonly known as:  NORCO/VICODIN Take 1 tablet  by mouth every 6 (six) hours as needed for moderate pain or severe pain.   metFORMIN 500 MG 24 hr tablet Commonly known as:  GLUCOPHAGE-XR Take 1 tablet (500 mg total) by mouth 2 (two) times daily.   omeprazole 40 MG capsule Commonly known as:  PRILOSEC Take 1 capsule (40 mg total) by mouth daily.   tamsulosin 0.4 MG Caps capsule Commonly known as:  FLOMAX Take 1 capsule (0.4 mg total) by mouth daily.   telmisartan 40 MG tablet Commonly known as:  MICARDIS Take 1 tablet (40 mg total) by mouth daily.   traZODone 150 MG tablet Commonly known as:  DESYREL Take 1 tablet (150 mg total) by mouth at bedtime.   umeclidinium-vilanterol 62.5-25 MCG/INH Aepb Commonly known as:  ANORO ELLIPTA Inhale 1 puff into the lungs daily.       Allergies:  Allergies  Allergen Reactions  . Ace Inhibitors Cough  . Diphenhydramine     stomach upset    Family History: Family History  Problem Relation Age of Onset  . Throat cancer Father   . Colon cancer Father   . Heart disease Father   . Cancer Father   . Depression Father   . Hypertension Father   . Parkinson's disease Father   . Arthritis Brother   . Stroke Mother   . Kidney disease Mother   . Heart disease Mother   . Kidney failure Mother   . Breast cancer Paternal Aunt   . Depression Sister   . Stroke Maternal Grandmother   . Bone cancer Brother   . Stroke Brother   . Prostate cancer Neg Hx   . Kidney cancer Neg Hx   . Bladder Cancer Neg Hx     Social History:  reports that he has quit smoking. His smoking use included cigarettes. He has never used smokeless tobacco. He reports that he does not drink alcohol or use drugs.  ROS: UROLOGY Frequent Urination?: Yes Hard to postpone urination?: No Burning/pain with urination?: No Get up at night to urinate?: Yes Leakage of urine?: No Urine stream starts and stops?: No Trouble starting stream?: No Do you have to strain to urinate?: No Blood in urine?: Yes Urinary tract  infection?: No Sexually transmitted disease?: No Injury to kidneys or bladder?: No Painful intercourse?: No Weak stream?: No Erection problems?: Yes Penile pain?: No  Gastrointestinal Nausea?: No Vomiting?: No Indigestion/heartburn?: No Diarrhea?: No Constipation?: No  Constitutional Fever: No Night sweats?: No Weight loss?: No Fatigue?: No  Skin Skin rash/lesions?: No Itching?: No  Eyes Blurred vision?: No Double vision?: No  Ears/Nose/Throat Sore throat?: No Sinus problems?: No  Hematologic/Lymphatic Swollen glands?: No Easy bruising?: No  Cardiovascular Leg swelling?: No Chest pain?: No  Respiratory Cough?: No Shortness of breath?: No  Endocrine Excessive thirst?: No  Musculoskeletal Back pain?: Yes Joint pain?: Yes  Neurological Headaches?: No Dizziness?: No  Psychologic Depression?: No Anxiety?: No  Physical Exam: BP 122/78 (BP Location: Right Arm, Patient Position: Sitting, Cuff Size: Large)   Pulse 67   Ht 5\' 10"  (1.778 m)   Wt 220 lb 6.4 oz (100 kg)   BMI 31.62 kg/m   Constitutional:  Well nourished. Alert and oriented, No acute distress. HEENT: [place] AT, moist mucus membranes.  Trachea midline, no masses. Cardiovascular: No clubbing, cyanosis, or edema. Respiratory: Normal respiratory effort, no increased work of breathing. GI: Abdomen is soft, non tender, non distended, no abdominal masses. Liver and spleen not palpable.  No hernias appreciated.  Stool sample for occult testing is not indicated.   GU: No CVA tenderness.  No bladder fullness or masses.  Patient with circumcised phallus. Urethral meatus is patent.  No penile discharge. No penile lesions or rashes. Scrotum without lesions, cysts, rashes and/or edema.  Testicles are located scrotally bilaterally. No masses are appreciated in the testicles. Left and right epididymis are normal. Rectal: Patient with  normal sphincter tone. Anus and perineum without scarring or rashes. No  rectal masses are appreciated. Prostate is approximately 70 + grams, could only palpate the apex and midsection of gland, no  nodules are appreciated. Seminal vesicles could not be palpated.   Skin: No rashes, bruises or suspicious lesions. Lymph: No cervical or inguinal adenopathy. Neurologic: Grossly intact, no focal deficits, moving all 4 extremities. Psychiatric: Normal mood and affect.  Laboratory Data: Lab Results  Component Value Date   WBC 7.1 03/24/2017   HGB 14.5 03/24/2017   HCT 42.3 03/24/2017   MCV 94.0 03/24/2017   PLT 159 03/24/2017    Lab Results  Component Value Date   CREATININE 1.03 11/29/2017    Lab Results  Component Value Date   PSA 3.4 01/17/2017    No results found for: TESTOSTERONE  Lab Results  Component Value Date   HGBA1C 5.4 11/29/2017    Lab Results  Component Value Date   TSH 2.60 01/17/2017       Component Value Date/Time   CHOL 126 11/29/2017 0936   CHOL 143 02/05/2016 1021   HDL 32 (L) 11/29/2017 0936   HDL 36 (L) 02/05/2016 1021   CHOLHDL 3.9 11/29/2017 0936   VLDL 42 (H) 04/21/2017 0852   LDLCALC 68 11/29/2017 0936    Lab Results  Component Value Date   AST 23 11/29/2017   Lab Results  Component Value Date   ALT 22 11/29/2017   No components found for: ALKALINEPHOPHATASE No components found for: BILIRUBINTOTAL  No results found for: ESTRADIOL   Urinalysis Negative.  See Epic.    Pertinent Imaging: CLINICAL DATA:  Intermittent RIGHT flank pain for several days, now constant. Microscopic hematuria. History of umbilical hernia repair, diverticulosis, hypertension and diabetes.  EXAM: CT ABDOMEN AND PELVIS WITHOUT CONTRAST  TECHNIQUE: Multidetector CT imaging of the abdomen and pelvis was performed following the standard protocol without IV contrast.  COMPARISON:  None.  FINDINGS: LOWER CHEST: Lung bases are clear. The visualized heart size is normal. Coronary artery calcification present. No  pericardial effusion.  HEPATOBILIARY: Normal.  PANCREAS: Nonacute. Pancreatic calcifications seen with chronic pancreatitis.  SPLEEN: Normal.  ADRENALS/URINARY TRACT: Kidneys are orthotopic, demonstrating normal size and morphology. No nephrolithiasis, hydronephrosis; limited assessment for renal masses by nonenhanced CT. Homogeneously hypodense 3.2 cm benign-appearing cyst lower pole RIGHT  kidney, 2.5 cm cyst lower pole RIGHT kidney. Punctate cortical calcification RIGHT lower pole. Too small to characterize exophytic hypodensities bilateral kidneys. The unopacified ureters are normal in course and caliber. Urinary bladder is partially distended, and no bladder calcifications. Normal adrenal glands.  STOMACH/BOWEL: The stomach, small and large bowel are normal in course and caliber without inflammatory changes, sensitivity decreased by lack of enteric contrast. Mild colonic diverticulosis. Normal appendix.  VASCULAR/LYMPHATIC: Aortoiliac vessels are normal in course and caliber. Moderate calcific atherosclerosis. No lymphadenopathy by CT size criteria.  REPRODUCTIVE: Prostatomegaly invading base of bladder. Central prostate subcentimeter hypodensity seen with TURP or cyst.  OTHER: No intraperitoneal free fluid or free air.  MUSCULOSKELETAL: Non-acute. Small bilateral fat containing inguinal hernias. Status post umbilical hernia repair with mesh. Severe L5-S1 spondylosis resulting in severe neural foraminal narrowing. Moderate to severe L4-5 neural foraminal narrowing.  IMPRESSION: 1. No nephrolithiasis or obstructive uropathy. Prostatomegaly invading base of bladder. 2. Colonic diverticulosis without acute diverticulitis nor acute intra-abdominal/pelvic process. 3. These results will be called to the ordering clinician or representative by the Radiology Department at the imaging location.  Aortic Atherosclerosis (ICD10-I70.0).   Electronically Signed    By: Elon Alas M.D.   On: 03/30/2018 15:08  I have independently reviewed the films  Assessment & Plan:    1. Pseudohematuria Explained to the patient that are many things that can cause a positive dip for blood on UA and that we need confirmatory microscopic hematuria which is 3 or greater red blood cells seen in the urine under the microscope His UA today was negative for hematuria He will return in 3 weeks for a follow-up UA and he will report any episode of gross hematuria in the interim  2. BPH with LUTS Continue conservative management, avoiding bladder irritants and timed voiding's Discussed starting a 5 alpha reductase inhibitor but when the side effects were mentioned the patient declined on starting this medication Explained to the patient that his prostate is not likely causing his right sided back pain RTC pending PSA results  3. Renal cysts Explained to the patient that the renal cysts are not likely causing his right-sided back pain RUS in three months for surveillance    Return in about 3 weeks (around 05/03/2018) for UA only .  These notes generated with voice recognition software. I apologize for typographical errors.  Zara Council, PA-C  [facility] [address] [place]  [place], [place] [address] [phone]

## 2018-04-12 ENCOUNTER — Ambulatory Visit: Payer: Medicare HMO | Admitting: Urology

## 2018-04-12 ENCOUNTER — Encounter

## 2018-04-12 ENCOUNTER — Encounter: Payer: Self-pay | Admitting: Urology

## 2018-04-12 VITALS — BP 122/78 | HR 67 | Ht 70.0 in | Wt 220.4 lb

## 2018-04-12 DIAGNOSIS — R8290 Unspecified abnormal findings in urine: Secondary | ICD-10-CM

## 2018-04-12 DIAGNOSIS — N138 Other obstructive and reflux uropathy: Secondary | ICD-10-CM

## 2018-04-12 DIAGNOSIS — N401 Enlarged prostate with lower urinary tract symptoms: Secondary | ICD-10-CM | POA: Diagnosis not present

## 2018-04-12 DIAGNOSIS — N281 Cyst of kidney, acquired: Secondary | ICD-10-CM

## 2018-04-12 LAB — BLADDER SCAN AMB NON-IMAGING: Scan Result: 71

## 2018-04-13 ENCOUNTER — Telehealth: Payer: Self-pay | Admitting: Urology

## 2018-04-13 ENCOUNTER — Telehealth: Payer: Self-pay

## 2018-04-13 LAB — URINALYSIS, COMPLETE
Bilirubin, UA: NEGATIVE
Glucose, UA: NEGATIVE
Ketones, UA: NEGATIVE
LEUKOCYTES UA: NEGATIVE
Nitrite, UA: NEGATIVE
Protein, UA: NEGATIVE
Specific Gravity, UA: 1.02 (ref 1.005–1.030)
Urobilinogen, Ur: 0.2 mg/dL (ref 0.2–1.0)
pH, UA: 5 (ref 5.0–7.5)

## 2018-04-13 LAB — MICROSCOPIC EXAMINATION
Bacteria, UA: NONE SEEN
Epithelial Cells (non renal): NONE SEEN /hpf (ref 0–10)
WBC, UA: NONE SEEN /hpf (ref 0–5)

## 2018-04-13 LAB — PSA: Prostate Specific Ag, Serum: 4.9 ng/mL — ABNORMAL HIGH (ref 0.0–4.0)

## 2018-04-13 NOTE — Telephone Encounter (Signed)
-----   Message from Nori Riis, PA-C sent at 04/13/2018  7:31 AM EDT ----- Please let Mr. Fettes know that his PSA returned at 4.9.  This is an elevated PSA and we need to repeat it next week to confirm that it is a true elevation.

## 2018-04-13 NOTE — Telephone Encounter (Signed)
Patient notified and lab app scheduled for next week   Juan Le

## 2018-04-13 NOTE — Telephone Encounter (Signed)
Left pt mess to call 

## 2018-04-19 ENCOUNTER — Other Ambulatory Visit: Payer: Medicare HMO

## 2018-04-19 DIAGNOSIS — R972 Elevated prostate specific antigen [PSA]: Secondary | ICD-10-CM

## 2018-04-19 DIAGNOSIS — R8290 Unspecified abnormal findings in urine: Secondary | ICD-10-CM | POA: Diagnosis not present

## 2018-04-19 LAB — URINALYSIS, COMPLETE
Bilirubin, UA: NEGATIVE
Glucose, UA: NEGATIVE
Ketones, UA: NEGATIVE
LEUKOCYTES UA: NEGATIVE
Nitrite, UA: NEGATIVE
PH UA: 5 (ref 5.0–7.5)
Protein, UA: NEGATIVE
Specific Gravity, UA: 1.02 (ref 1.005–1.030)
UUROB: 0.2 mg/dL (ref 0.2–1.0)

## 2018-04-19 LAB — MICROSCOPIC EXAMINATION
Epithelial Cells (non renal): NONE SEEN /hpf (ref 0–10)
WBC UA: NONE SEEN /HPF (ref 0–5)

## 2018-04-20 LAB — PSA: PROSTATE SPECIFIC AG, SERUM: 5.4 ng/mL — AB (ref 0.0–4.0)

## 2018-04-21 ENCOUNTER — Telehealth: Payer: Self-pay

## 2018-04-21 NOTE — Telephone Encounter (Signed)
Left detailed message, please schedule office visit

## 2018-04-21 NOTE — Telephone Encounter (Signed)
-----   Message from Nori Riis, PA-C sent at 04/19/2018  1:57 PM EDT ----- Please let Mr. Darnell know that his UA was negative for blood.

## 2018-04-21 NOTE — Telephone Encounter (Signed)
Left detailed message.   

## 2018-04-21 NOTE — Telephone Encounter (Signed)
-----   Message from Nori Riis, PA-C sent at 04/21/2018  8:11 AM EDT ----- Please let Juan Le know that his PSA has increased again.  He should have an office visit to discuss his next steps.

## 2018-04-25 ENCOUNTER — Telehealth: Payer: Self-pay | Admitting: Urology

## 2018-04-25 NOTE — Telephone Encounter (Signed)
Patient already has a follow up app on 05-15-18   [facility]

## 2018-05-01 ENCOUNTER — Other Ambulatory Visit: Payer: Self-pay | Admitting: Family Medicine

## 2018-05-01 DIAGNOSIS — K219 Gastro-esophageal reflux disease without esophagitis: Secondary | ICD-10-CM

## 2018-05-01 MED ORDER — OMEPRAZOLE 40 MG PO CPDR: 40.0000 mg | DELAYED_RELEASE_CAPSULE | Freq: Every day | ORAL | 1 refills | Status: DC

## 2018-05-01 NOTE — Telephone Encounter (Signed)
Pt contacted office for refill request on the following medications:  omeprazole (PRILOSEC) 40 MG capsule  90 day supply  [facility]  Last Rx: 11/02/17 90 day supply 1 refill by Cassell Smiles PA LOV: 03/30/18 with Dr. Jacinto Reap NOV: 06/12/18 Please advise. Thanks TNP

## 2018-05-03 ENCOUNTER — Telehealth: Payer: Self-pay | Admitting: Urology

## 2018-05-03 ENCOUNTER — Other Ambulatory Visit: Payer: Self-pay

## 2018-05-03 ENCOUNTER — Other Ambulatory Visit: Payer: Medicare HMO

## 2018-05-03 DIAGNOSIS — R3129 Other microscopic hematuria: Secondary | ICD-10-CM

## 2018-05-03 LAB — URINALYSIS, COMPLETE
Bilirubin, UA: NEGATIVE
GLUCOSE, UA: NEGATIVE
KETONES UA: NEGATIVE
Leukocytes, UA: NEGATIVE
NITRITE UA: NEGATIVE
UUROB: 0.2 mg/dL (ref 0.2–1.0)
pH, UA: 5 (ref 5.0–7.5)

## 2018-05-03 LAB — MICROSCOPIC EXAMINATION
EPITHELIAL CELLS (NON RENAL): NONE SEEN /HPF (ref 0–10)
WBC UA: NONE SEEN /HPF (ref 0–5)

## 2018-05-03 NOTE — Telephone Encounter (Signed)
Would you add an urine culture to his labs?

## 2018-05-03 NOTE — Telephone Encounter (Signed)
Culture order added.

## 2018-05-05 LAB — CULTURE, URINE COMPREHENSIVE

## 2018-05-13 NOTE — Progress Notes (Deleted)
05/15/2018 3:41 PM   Juan Le [birth year] [id number]  Referring provider: [name], MD [address] [place], [name] [address]  No chief complaint on file.  HPI: Patient is a 72 year old Caucasian male with an increasing PSA velocity, BPH with LUTS, microscopic hematuria and renal cysts who returns to discuss rising PSA results.  Increase in PSA velocity PSA Trend  3.4 in 04/18  4.9 in 06/19  5.4 in 04/2018  BPH WITH LUTS  (prostate and/or bladder) IPSS score: *** PVR: ***   Previous score: ***   Previous PVR: 71 mL  Major complaint(s):  x *** years. Denies any dysuria, hematuria or suprapubic pain.   Currently taking: tamsulosin 0.4 mg daily  His has had ***.   Denies any recent fevers, chills, nausea or vomiting.  He has a family history of PCa, with ***.   He does not have a family history of PCa.***    Score:  1-7 Mild 8-19 Moderate 20-35 Severe    Microscopic hematuria  UA in 04/2018 was positive for 3-10 RBC's with negative urine culture.  Former smoker.  Exposed to second hand smoke by wife.     Renal cysts Kidneys are orthotopic, demonstrating normal size and morphology. No nephrolithiasis, hydronephrosis; limited assessment for renal masses by nonenhanced CT. Homogeneously hypodense 3.2 cm benign-appearing cyst lower pole RIGHT kidney, 2.5 cm cyst lower pole RIGHT kidney. Punctate cortical calcification RIGHT lower pole. Too small to characterize exophytic hypodensities bilateral kidneys. The unopacified ureters are normal in course and caliber. Urinary bladder is partially distended, and no bladder calcifications. Normal adrenal glands on CT Renal stone study in 03/2018.      Patient is a 47 -year-old Caucasian male who presents today as a referral from Juan Le prostatomegaly.      He is having right upper back pain.  He describes it as a sharp pain.  The pain has been occurring for 3 weeks.  6/10 pain.   Nothing makes it worse.  Nothing makes it better.    Patient was found to have positive dips for blood at PCP's office.  He denies any gross hematuria.      He does not have a prior history of recurrent urinary tract infections, nephrolithiasis, trauma to the genitourinary tract, BPH or malignancies of the genitourinary tract.   His brother had kidney stones.  He does not have a family history of malignancies of the genitourinary tract or hematuria.   Today, he is having symptoms of frequent urination x 8-10 and nocturia x 3.  His UA today is negative.  PVR is 71 mL.    Patient denies any gross hematuria, dysuria or suprapubic/flank pain.  Patient denies any fevers, chills, nausea or vomiting.    He smoked as a teenager.  He is exposured to second hand smoke at his home.  Wife is a smoker. He has worked with chemicals in the past.  He has a high BMI.    CT Renal stone study on 03/30/2018 noted no nephrolithiasis or obstructive uropathy. Prostatomegaly invading base of bladder.  Colonic diverticulosis without acute diverticulitis nor acute intra-abdominal/pelvic process. Aortic Atherosclerosis. Small bilateral fat containing inguinal hernias. Status post umbilical hernia repair with mesh. Severe L5-S1 spondylosis resulting in severe neural foraminal narrowing. Moderate to severe L4-5 neural foraminal narrowing   PMH: Past Medical History:  Diagnosis Date  . Anxiety   . Atelectasis of left lung 03/25/2017  . Chronic, continuous  use of opioids   . COPD (chronic obstructive pulmonary disease) ([facility])   . Degenerative disc disease, lumbar   . Diabetes ([place])   . DJD (degenerative joint disease)   . GERD (gastroesophageal reflux disease)   . Heart attack ([facility] [facility])   . History of diverticulitis   . Hypertension   . Hypertension   . Hypertriglyceridemia   . Hypokalemia   . Insomnia   . Mitral regurgitation   . Sleep apnea   . Vitamin D deficiency   . Wears dentures    full upper    Surgical  History: Past Surgical History:  Procedure Laterality Date  . COLONOSCOPY WITH PROPOFOL N/A 01/28/2017   Procedure: COLONOSCOPY WITH PROPOFOL;  Surgeon: [name], MD;  Location: [facility];  Service: Endoscopy;  Laterality: N/A;  diabetic - oral meds  . CORONARY ARTERY BYPASS GRAFT  1997  . CORONARY STENT PLACEMENT  11/2006  . FOOT SURGERY  2003  . HERNIA REPAIR    . POLYPECTOMY N/A 01/28/2017   Procedure: POLYPECTOMY;  Surgeon: [name], MD;  Location: [facility];  Service: Endoscopy;  Laterality: N/A;  . UMBILICAL HERNIA REPAIR  04/2012    Home Medications:  Allergies as of 05/15/2018      Reactions   Ace Inhibitors Cough   Diphenhydramine    stomach upset      Medication List        Accurate as of 05/13/18  3:41 PM. Always use your most recent med list.          aspirin 81 MG tablet Take 1 tablet by mouth daily.   atorvastatin 40 MG tablet Commonly known as:  LIPITOR Take 1 tablet (40 mg total) by mouth daily at 6 PM.   cloNIDine 0.1 MG tablet Commonly known as:  CATAPRES Take 1 tablet (0.1 mg total) by mouth 2 (two) times daily.   Fish Oil 1000 MG Caps Take 2 capsules by mouth daily.   gabapentin 400 MG capsule Commonly known as:  NEURONTIN Take 2 capsules (800 mg total) by mouth 3 (three) times daily.   glucose blood test strip Use as instructed   HYDROcodone-acetaminophen 5-325 MG tablet Commonly known as:  NORCO/VICODIN Take 1 tablet by mouth every 6 (six) hours as needed for moderate pain or severe pain.   metFORMIN 500 MG 24 hr tablet Commonly known as:  GLUCOPHAGE-XR Take 1 tablet (500 mg total) by mouth 2 (two) times daily.   omeprazole 40 MG capsule Commonly known as:  PRILOSEC Take 1 capsule (40 mg total) by mouth daily.   tamsulosin 0.4 MG Caps capsule Commonly known as:  FLOMAX Take 1 capsule (0.4 mg total) by mouth daily.   telmisartan 40 MG tablet Commonly known as:  MICARDIS Take 1 tablet (40 mg total) by mouth  daily.   traZODone 150 MG tablet Commonly known as:  DESYREL Take 1 tablet (150 mg total) by mouth at bedtime.   umeclidinium-vilanterol 62.5-25 MCG/INH Aepb Commonly known as:  ANORO ELLIPTA Inhale 1 puff into the lungs daily.       Allergies:  Allergies  Allergen Reactions  . Ace Inhibitors Cough  . Diphenhydramine     stomach upset    Family History: Family History  Problem Relation Age of Onset  . Throat cancer Father   . Colon cancer Father   . Heart disease Father   . Cancer Father   . Depression Father   . Hypertension Father   . Parkinson's disease  Father   . Arthritis Brother   . Stroke Mother   . Kidney disease Mother   . Heart disease Mother   . Kidney failure Mother   . Breast cancer Paternal Aunt   . Depression Sister   . Stroke Maternal Grandmother   . Bone cancer Brother   . Stroke Brother   . Prostate cancer Neg Hx   . Kidney cancer Neg Hx   . Bladder Cancer Neg Hx     Social History:  reports that he has quit smoking. His smoking use included cigarettes. He has never used smokeless tobacco. He reports that he does not drink alcohol or use drugs.  ROS:                                        Physical Exam: There were no vitals taken for this visit.  Constitutional: Well nourished. Alert and oriented, No acute distress. HEENT: [place] AT, moist mucus membranes. Trachea midline, no masses. Cardiovascular: No clubbing, cyanosis, or edema. Respiratory: Normal respiratory effort, no increased work of breathing. Skin: No rashes, bruises or suspicious lesions. Lymph: No cervical or inguinal adenopathy. Neurologic: Grossly intact, no focal deficits, moving all 4 extremities. Psychiatric: Normal mood and affect.  Laboratory Data: Lab Results  Component Value Date   WBC 7.1 03/24/2017   HGB 14.5 03/24/2017   HCT 42.3 03/24/2017   MCV 94.0 03/24/2017   PLT 159 03/24/2017    Lab Results  Component Value Date    CREATININE 1.03 11/29/2017    Lab Results  Component Value Date   PSA 3.4 01/17/2017    No results found for: TESTOSTERONE  Lab Results  Component Value Date   HGBA1C 5.4 11/29/2017    Lab Results  Component Value Date   TSH 2.60 01/17/2017       Component Value Date/Time   CHOL 126 11/29/2017 0936   CHOL 143 02/05/2016 1021   HDL 32 (L) 11/29/2017 0936   HDL 36 (L) 02/05/2016 1021   CHOLHDL 3.9 11/29/2017 0936   VLDL 42 (H) 04/21/2017 0852   LDLCALC 68 11/29/2017 0936    Lab Results  Component Value Date   AST 23 11/29/2017   Lab Results  Component Value Date   ALT 22 11/29/2017   No components found for: ALKALINEPHOPHATASE No components found for: BILIRUBINTOTAL  No results found for: ESTRADIOL   I have reviewed the labs.  Pertinent Imaging: CLINICAL DATA:  Intermittent RIGHT flank pain for several days, now constant. Microscopic hematuria. History of umbilical hernia repair, diverticulosis, hypertension and diabetes.  EXAM: CT ABDOMEN AND PELVIS WITHOUT CONTRAST  TECHNIQUE: Multidetector CT imaging of the abdomen and pelvis was performed following the standard protocol without IV contrast.  COMPARISON:  None.  FINDINGS: LOWER CHEST: Lung bases are clear. The visualized heart size is normal. Coronary artery calcification present. No pericardial effusion.  HEPATOBILIARY: Normal.  PANCREAS: Nonacute. Pancreatic calcifications seen with chronic pancreatitis.  SPLEEN: Normal.  ADRENALS/URINARY TRACT: Kidneys are orthotopic, demonstrating normal size and morphology. No nephrolithiasis, hydronephrosis; limited assessment for renal masses by nonenhanced CT. Homogeneously hypodense 3.2 cm benign-appearing cyst lower pole RIGHT kidney, 2.5 cm cyst lower pole RIGHT kidney. Punctate cortical calcification RIGHT lower pole. Too small to characterize exophytic hypodensities bilateral kidneys. The unopacified ureters are normal in course  and caliber. Urinary bladder is partially distended, and no bladder calcifications. Normal adrenal glands.  STOMACH/BOWEL: The stomach, small and large bowel are normal in course and caliber without inflammatory changes, sensitivity decreased by lack of enteric contrast. Mild colonic diverticulosis. Normal appendix.  VASCULAR/LYMPHATIC: Aortoiliac vessels are normal in course and caliber. Moderate calcific atherosclerosis. No lymphadenopathy by CT size criteria.  REPRODUCTIVE: Prostatomegaly invading base of bladder. Central prostate subcentimeter hypodensity seen with TURP or cyst.  OTHER: No intraperitoneal free fluid or free air.  MUSCULOSKELETAL: Non-acute. Small bilateral fat containing inguinal hernias. Status post umbilical hernia repair with mesh. Severe L5-S1 spondylosis resulting in severe neural foraminal narrowing. Moderate to severe L4-5 neural foraminal narrowing.  IMPRESSION: 1. No nephrolithiasis or obstructive uropathy. Prostatomegaly invading base of bladder. 2. Colonic diverticulosis without acute diverticulitis nor acute intra-abdominal/pelvic process. 3. These results will be called to the ordering clinician or representative by the Radiology Department at the imaging location.  Aortic Atherosclerosis (ICD10-I70.0).   Electronically Signed   By: Elon Alas M.D.   On: 03/30/2018 15:08  Assessment & Plan:    1. Rising PSA velocity Explained to the patient that this is concerning for prostate cancer, but with his microscopic hematuria we also need to evaluate for urological cancers that may be in the kidneys or bladder.  Due to his age any treatment of a prostate cancer would be geared towards a palliative course not a curative so we will pursue the hematuria work up first as there may be findings to explain the increasing PSA - in the interim he may consider starting finasteride to see if it reduces the PSA   2. Microscopic hematuria  - I  explained to the patient that there are a number of causes that can be associated with blood in the urine, such as stones, BPH, UTI's, damage to the urinary tract and/or cancer.  - At this time, I felt that the patient warranted further urologic evaluation with 3 or greater RBC's/hpf on microscopic evaluation of the urine.  The AUA guidelines state that a CT urogram is the preferred imaging study to evaluate hematuria.  - I explained to the patient that a contrast material will be injected into a vein and that in rare instances, an allergic reaction can result and may even life threatening   The patient denies any allergies to contrast***, iodine and/or seafood*** and is not taking metformin.***  - Her reproductive status is hysterectomy, postmenopausal, tubal ligation are unknown at this time.  We will obtain a serum pregnancy test today. ***  - On occasion, we may need to resort to a non-contrast study such as a renal ultrasound or a non-contrast CT if the patient has a contrast allergy or renal insufficiency.  In the latter case,  I told him/her *** that an upper tract study without contrast will lack the detail of excluding some urologic tumors.  Because of this, he/she would need to undergo cystoscopy with bilateral retrogrades in the OR to complete the hematuria workup in addition to the imaging studies. ***  - Following the imaging study,  I've recommended a cystoscopy. I described how this is performed, typically in an office setting with a flexible cystoscope. We described the risks, benefits, and possible side effects, the most common of which is a minor amount of blood in the urine and/or burning which usually resolves in 24 to 48 hours.  ***  - The patient had the opportunity to ask questions which were answered. Based upon this discussion, the patient is willing to proceed. Therefore, I've ordered: a CT Urogram and  cystoscopy.  - The patient will return following all of the above for discussion  of the results.   - UA  - Urine culture  - BUN + creatinine    - if testing returns without finding an etiology for the hematuria, we will need to see the patient back yearly -if they do not have any recurrent gross hematuria or AMH they may be released from our care - if gross hematuria or AMH persist may consider referral to nephrology - if gross hematuria or AMH persists beyond two years - may consider to repeat studies based on patient's risk factors for cancer   3. BPH with LU TS I PSS score is *** Continue conservative management, avoiding bladder irritants and timed voiding's Discussed starting a 5 alpha reductase inhibitor but when the side effects were mentioned the patient declined on starting this medication Explained to the patient that his prostate is not likely causing his right sided back pain RTC pending PSA results  4. Renal cysts Explained to the patient that the renal cysts are not likely causing his right-sided back pain RUS in three months for surveillance    No follow-ups on file.  These notes generated with voice recognition software. I apologize for typographical errors.  Zara Council, PA-C  [facility] [address] [place]  [place], [place] [address] [phone]

## 2018-05-15 ENCOUNTER — Encounter: Payer: Self-pay | Admitting: Urology

## 2018-05-15 ENCOUNTER — Other Ambulatory Visit: Payer: Self-pay | Admitting: Family Medicine

## 2018-05-15 ENCOUNTER — Ambulatory Visit: Payer: Medicare HMO | Admitting: Urology

## 2018-05-15 DIAGNOSIS — E119 Type 2 diabetes mellitus without complications: Secondary | ICD-10-CM

## 2018-05-15 MED ORDER — METFORMIN HCL ER 500 MG PO TB24: 500.0000 mg | ORAL_TABLET | Freq: Two times a day (BID) | ORAL | 1 refills | Status: DC

## 2018-05-15 NOTE — Telephone Encounter (Signed)
Pt needs refill on metformin 500 mg  [employer]  Thanks teri

## 2018-05-16 ENCOUNTER — Encounter: Payer: Self-pay | Admitting: Urology

## 2018-05-16 NOTE — Progress Notes (Signed)
Certified letter sent on 05/16/2018.

## 2018-05-30 NOTE — Progress Notes (Signed)
05/31/2018 11:05 AM   Juan Le 1946/04/16 [id number]  Referring provider: [name], MD [address] [place], [place] [address]  Chief Complaint  Patient presents with  . Benign Prostatic Hypertrophy   HPI: Patient is a 72 year old Caucasian male with an increasing PSA velocity, BPH with LUTS, microscopic hematuria and renal cysts who returns to discuss rising PSA results.  Patient had a vasovagal response when his blood was drawn today.  He states that he usually lays down to have his blood drawn because of this.    Increase in PSA velocity PSA Trend  3.4 in 04/18  4.9 in 06/19  5.4 in 04/2018  BPH WITH LUTS  (prostate and/or bladder) IPSS score: 5/2.  Previous PVR: 71 mL  Major complaint(s):  Frequency and nocturia x  years. Denies any dysuria, hematuria or suprapubic pain.   Currently taking: tamsulosin 0.4 mg daily  Denies any recent fevers, chills, nausea or vomiting. IPSS    Row Name 05/31/18 1000         International Prostate Symptom Score   How often have you had the sensation of not emptying your bladder?  Not at All     How often have you had to urinate less than every two hours?  Less than half the time     How often have you found you stopped and started again several times when you urinated?  Not at All     How often have you found it difficult to postpone urination?  Less than 1 in 5 times     How often have you had a weak urinary stream?  Not at All     How often have you had to strain to start urination?  Not at All     How many times did you typically get up at night to urinate?  2 Times     Total IPSS Score  5       Quality of Life due to urinary symptoms   If you were to spend the rest of your life with your urinary condition just the way it is now how would you feel about that?  Mostly Satisfied        Score:  1-7 Mild 8-19 Moderate 20-35 Severe    Microscopic hematuria  UA in 04/2018 was positive  for 3-10 RBC's with negative urine culture.  Former smoker.  Exposed to second hand smoke by wife.    Renal cysts Kidneys are orthotopic, demonstrating normal size and morphology. No nephrolithiasis, hydronephrosis; limited assessment for renal masses by nonenhanced CT. Homogeneously hypodense 3.2 cm benign-appearing cyst lower pole RIGHT kidney, 2.5 cm cyst lower pole RIGHT kidney. Punctate cortical calcification RIGHT lower pole. Too small to characterize exophytic hypodensities bilateral kidneys. The unopacified ureters are normal in course and caliber. Urinary bladder is partially distended, and no bladder calcifications. Normal adrenal glands on CT Renal stone study in 03/2018.     PMH: Past Medical History:  Diagnosis Date  . Anxiety   . Atelectasis of left lung 03/25/2017  . Chronic, continuous use of opioids   . COPD (chronic obstructive pulmonary disease) ([facility])   . Degenerative disc disease, lumbar   . Diabetes ([facility])   . DJD (degenerative joint disease)   . GERD (gastroesophageal reflux disease)   . Heart attack ([place] [facility] [place])   . History of diverticulitis   . Hypertension   . Hypertension   . Hypertriglyceridemia   .  Hypokalemia   . Insomnia   . Mitral regurgitation   . Sleep apnea   . Vitamin D deficiency   . Wears dentures    full upper    Surgical History: Past Surgical History:  Procedure Laterality Date  . COLONOSCOPY WITH PROPOFOL N/A 01/28/2017   Procedure: COLONOSCOPY WITH PROPOFOL;  Surgeon: [name], MD;  Location: [facility];  Service: Endoscopy;  Laterality: N/A;  diabetic - oral meds  . CORONARY ARTERY BYPASS GRAFT  1997  . CORONARY STENT PLACEMENT  11/2006  . FOOT SURGERY  2003  . HERNIA REPAIR    . POLYPECTOMY N/A 01/28/2017   Procedure: POLYPECTOMY;  Surgeon: [name], MD;  Location: [facility];  Service: Endoscopy;  Laterality: N/A;  . UMBILICAL HERNIA REPAIR  04/2012    Home Medications:  Allergies as of 05/31/2018      Reactions    Ace Inhibitors Cough   Diphenhydramine    stomach upset      Medication List        Accurate as of 05/31/18 11:05 AM. Always use your most recent med list.          aspirin 81 MG tablet Take 1 tablet by mouth daily.   atorvastatin 40 MG tablet Commonly known as:  LIPITOR Take 1 tablet (40 mg total) by mouth daily at 6 PM.   cloNIDine 0.1 MG tablet Commonly known as:  CATAPRES Take 1 tablet (0.1 mg total) by mouth 2 (two) times daily.   Fish Oil 1000 MG Caps Take 2 capsules by mouth daily.   gabapentin 400 MG capsule Commonly known as:  NEURONTIN Take 2 capsules (800 mg total) by mouth 3 (three) times daily.   glucose blood test strip Use as instructed   HYDROcodone-acetaminophen 5-325 MG tablet Commonly known as:  NORCO/VICODIN Take 1 tablet by mouth every 6 (six) hours as needed for moderate pain or severe pain.   metFORMIN 500 MG 24 hr tablet Commonly known as:  GLUCOPHAGE-XR Take 1 tablet (500 mg total) by mouth 2 (two) times daily.   omeprazole 40 MG capsule Commonly known as:  PRILOSEC Take 1 capsule (40 mg total) by mouth daily.   tamsulosin 0.4 MG Caps capsule Commonly known as:  FLOMAX Take 1 capsule (0.4 mg total) by mouth daily.   telmisartan 40 MG tablet Commonly known as:  MICARDIS Take 1 tablet (40 mg total) by mouth daily.   traZODone 150 MG tablet Commonly known as:  DESYREL Take 1 tablet (150 mg total) by mouth at bedtime.   umeclidinium-vilanterol 62.5-25 MCG/INH Aepb Commonly known as:  ANORO ELLIPTA Inhale 1 puff into the lungs daily.       Allergies:  Allergies  Allergen Reactions  . Ace Inhibitors Cough  . Diphenhydramine     stomach upset    Family History: Family History  Problem Relation Age of Onset  . Throat cancer Father   . Colon cancer Father   . Heart disease Father   . Cancer Father   . Depression Father   . Hypertension Father   . Parkinson's disease Father   . Arthritis Brother   . Stroke Mother   .  Kidney disease Mother   . Heart disease Mother   . Kidney failure Mother   . Breast cancer Paternal Aunt   . Depression Sister   . Stroke Maternal Grandmother   . Bone cancer Brother   . Stroke Brother   . Prostate cancer Neg Hx   .  Kidney cancer Neg Hx   . Bladder Cancer Neg Hx     Social History:  reports that he has quit smoking. His smoking use included cigarettes. He has never used smokeless tobacco. He reports that he does not drink alcohol or use drugs.  ROS: UROLOGY Frequent Urination?: Yes Hard to postpone urination?: No Burning/pain with urination?: No Get up at night to urinate?: Yes Leakage of urine?: No Urine stream starts and stops?: No Trouble starting stream?: No Do you have to strain to urinate?: No Blood in urine?: No Urinary tract infection?: No Sexually transmitted disease?: No Injury to kidneys or bladder?: No Painful intercourse?: No Weak stream?: No Erection problems?: No Penile pain?: No  Gastrointestinal Nausea?: No Vomiting?: No Indigestion/heartburn?: No Diarrhea?: No Constipation?: No  Constitutional Fever: No Night sweats?: No Weight loss?: No Fatigue?: No  Skin Skin rash/lesions?: No Itching?: No  Eyes Blurred vision?: No Double vision?: No  Ears/Nose/Throat Sore throat?: No Sinus problems?: No  Hematologic/Lymphatic Swollen glands?: No Easy bruising?: No  Cardiovascular Leg swelling?: No Chest pain?: No  Respiratory Cough?: No Shortness of breath?: No  Endocrine Excessive thirst?: No  Musculoskeletal Back pain?: No Joint pain?: No  Neurological Headaches?: No Dizziness?: No  Psychologic Depression?: No Anxiety?: No  Physical Exam: Constitutional: Well nourished. Alert and oriented, No acute distress. HEENT: [place] AT, moist mucus membranes. Trachea midline, no masses. Cardiovascular: No clubbing, cyanosis, or edema. Respiratory: Normal respiratory effort, no increased work of breathing. Skin: No  rashes, bruises or suspicious lesions. Lymph: No cervical or inguinal adenopathy. Neurologic: Grossly intact, no focal deficits, moving all 4 extremities. Psychiatric: Normal mood and affect.  Laboratory Data: Lab Results  Component Value Date   WBC 7.1 03/24/2017   HGB 14.5 03/24/2017   HCT 42.3 03/24/2017   MCV 94.0 03/24/2017   PLT 159 03/24/2017    Lab Results  Component Value Date   CREATININE 1.03 11/29/2017    Lab Results  Component Value Date   PSA 3.4 01/17/2017    No results found for: TESTOSTERONE  Lab Results  Component Value Date   HGBA1C 5.4 11/29/2017    Lab Results  Component Value Date   TSH 2.60 01/17/2017       Component Value Date/Time   CHOL 126 11/29/2017 0936   CHOL 143 02/05/2016 1021   HDL 32 (L) 11/29/2017 0936   HDL 36 (L) 02/05/2016 1021   CHOLHDL 3.9 11/29/2017 0936   VLDL 42 (H) 04/21/2017 0852   LDLCALC 68 11/29/2017 0936    Lab Results  Component Value Date   AST 23 11/29/2017   Lab Results  Component Value Date   ALT 22 11/29/2017   No components found for: ALKALINEPHOPHATASE No components found for: BILIRUBINTOTAL  No results found for: ESTRADIOL   I have reviewed the labs.  Pertinent Imaging: CLINICAL DATA:  Intermittent RIGHT flank pain for several days, now constant. Microscopic hematuria. History of umbilical hernia repair, diverticulosis, hypertension and diabetes.  EXAM: CT ABDOMEN AND PELVIS WITHOUT CONTRAST  TECHNIQUE: Multidetector CT imaging of the abdomen and pelvis was performed following the standard protocol without IV contrast.  COMPARISON:  None.  FINDINGS: LOWER CHEST: Lung bases are clear. The visualized heart size is normal. Coronary artery calcification present. No pericardial effusion.  HEPATOBILIARY: Normal.  PANCREAS: Nonacute. Pancreatic calcifications seen with chronic pancreatitis.  SPLEEN: Normal.  ADRENALS/URINARY TRACT: Kidneys are orthotopic, demonstrating  normal size and morphology. No nephrolithiasis, hydronephrosis; limited assessment for renal masses by nonenhanced CT.  Homogeneously hypodense 3.2 cm benign-appearing cyst lower pole RIGHT kidney, 2.5 cm cyst lower pole RIGHT kidney. Punctate cortical calcification RIGHT lower pole. Too small to characterize exophytic hypodensities bilateral kidneys. The unopacified ureters are normal in course and caliber. Urinary bladder is partially distended, and no bladder calcifications. Normal adrenal glands.  STOMACH/BOWEL: The stomach, small and large bowel are normal in course and caliber without inflammatory changes, sensitivity decreased by lack of enteric contrast. Mild colonic diverticulosis. Normal appendix.  VASCULAR/LYMPHATIC: Aortoiliac vessels are normal in course and caliber. Moderate calcific atherosclerosis. No lymphadenopathy by CT size criteria.  REPRODUCTIVE: Prostatomegaly invading base of bladder. Central prostate subcentimeter hypodensity seen with TURP or cyst.  OTHER: No intraperitoneal free fluid or free air.  MUSCULOSKELETAL: Non-acute. Small bilateral fat containing inguinal hernias. Status post umbilical hernia repair with mesh. Severe L5-S1 spondylosis resulting in severe neural foraminal narrowing. Moderate to severe L4-5 neural foraminal narrowing.  IMPRESSION: 1. No nephrolithiasis or obstructive uropathy. Prostatomegaly invading base of bladder. 2. Colonic diverticulosis without acute diverticulitis nor acute intra-abdominal/pelvic process. 3. These results will be called to the ordering clinician or representative by the Radiology Department at the imaging location.  Aortic Atherosclerosis (ICD10-I70.0).   Electronically Signed   By: Elon Alas M.D.   On: 03/30/2018 15:08  Assessment & Plan:    1. Rising PSA velocity Explained to the patient that this is concerning for prostate cancer, but with his microscopic hematuria we also  need to evaluate for urological cancers that may be in the kidneys or bladder.  Due to his age any treatment of a prostate cancer would be geared towards a palliative course not a curative so we will pursue the hematuria work up first as there may be findings to explain the increasing PSA  2. Microscopic hematuria I explained to the patient that there are a number of causes that can be associated with blood in the urine, such as stones, BPH, UTI's, damage to the urinary tract and/or cancer. At this time, I felt that the patient warranted further urologic evaluation with 3 or greater RBC's/hpf on microscopic evaluation of the urine.  The AUA guidelines state that a CT urogram is the preferred imaging study to evaluate hematuria. I explained to the patient that a contrast material will be injected into a vein and that in rare instances, an allergic reaction can result and may even life threatening   The patient denies any allergies to contrast, iodine and/or seafood and is taking metformin. Following the imaging study,  I've recommended a cystoscopy. I described how this is performed, typically in an office setting with a flexible cystoscope. We described the risks, benefits, and possible side effects, the most common of which is a minor amount of blood in the urine and/or burning which usually resolves in 24 to 48 hours - he wants to have this in the OR as he has had one in the past and does not feel comfortable having it in the office The patient had the opportunity to ask questions which were answered. Based upon this discussion, the patient is willing to proceed. Therefore, I've ordered: a CT Urogram The patient will return following all of the above for discussion of the results.  BUN + creatinine     3. BPH with LU TS I PSS score is 5/2 Continue conservative management, avoiding bladder irritants and timed voiding's RTC pending PSA results and CTU results   4. Renal cysts CTU pending     Return for CTU  report .  These notes generated with voice recognition software. I apologize for typographical errors.  Zara Council, PA-C  [facility] [address] [place]  [place], [place] [address] [phone]

## 2018-05-31 ENCOUNTER — Ambulatory Visit (INDEPENDENT_AMBULATORY_CARE_PROVIDER_SITE_OTHER): Payer: Medicare HMO | Admitting: Urology

## 2018-05-31 ENCOUNTER — Encounter: Payer: Self-pay | Admitting: Urology

## 2018-05-31 DIAGNOSIS — N401 Enlarged prostate with lower urinary tract symptoms: Secondary | ICD-10-CM | POA: Diagnosis not present

## 2018-05-31 DIAGNOSIS — R3129 Other microscopic hematuria: Secondary | ICD-10-CM | POA: Diagnosis not present

## 2018-05-31 DIAGNOSIS — R972 Elevated prostate specific antigen [PSA]: Secondary | ICD-10-CM | POA: Diagnosis not present

## 2018-05-31 DIAGNOSIS — N138 Other obstructive and reflux uropathy: Secondary | ICD-10-CM

## 2018-06-01 LAB — PSA: PROSTATE SPECIFIC AG, SERUM: 7.8 ng/mL — AB (ref 0.0–4.0)

## 2018-06-05 ENCOUNTER — Telehealth: Payer: Self-pay | Admitting: Urology

## 2018-06-05 NOTE — Telephone Encounter (Signed)
I called and he did not want to move it up. I had an opening for the 3rd but he declined it. It's only a few days   Sharyn Lull

## 2018-06-05 NOTE — Telephone Encounter (Signed)
-----   Message from Nori Riis, PA-C sent at 06/04/2018  8:31 PM EDT ----- Patient's PSA is increasing rapidly.  He has a CTU appointment on 06/16/2018, but his return appointment is not until the 5th of September.  Would any of the physicians be able to see him sooner?

## 2018-06-05 NOTE — Telephone Encounter (Signed)
Thanks for trying

## 2018-06-08 NOTE — Progress Notes (Signed)
[facility] Consultation      Assessment and Plan:  The patient is a 72 year old male with OSA and dyspnea on exertion, due to emphysema.   Emphysema.  -Hyperinflation seen on recent chest x-ray imaging, possibly consistent with emphysema. The patient is a nonsmoker, but his wife is a smoker. -Continue Anoro. He declines PFT.  --He does not want to do pulmonary rehab.  --Will start rescue inhaler to use as needed.   Dyspnea on exertion.  -Multifactorial from possible underlying emphysema, as well as deconditioning.  Obstructive sleep apnea -Split night study 04/27/17; AHI equals 24. Recommended on AutoPap 8-14. -He declined to use PAP.    Return in about 6 months (around 12/10/2018).   Date: 06/08/2018  MRN# [id number] Juan Le. Jun 21, 1946   Juan Le. is a 72 y.o. old male seen in consultation for chief complaint of:    Chief Complaint  Patient presents with  . Shortness of Breath    only with exertion  . Cough    pt declined sleep study due to fear of mask.    HPI:   The patient is a 72 yo male with COPD and progressive dyspnea.  He had previously declined PFT and CPAP. Last visit he was asked to continue Anoro and rescue inhaler as needed. He has mild dyspnea on exertion, he has been using Anoro every day and feels that is it helping.  He does have a rescue inhaler, he tries to remain active vacuuming and cleaning.  He decided not to do a sleep study as he does not want cpap.  **Sat walk 04/13/17>> at rest on RA sat was 98% and HR 69; after 180 feet sat was 95% and HR 84 mild dyspnea at moderate pace. After 360 feet was 98% and HR 89, moderate dyspnea.  **chest x-ray 03/24/17>> Mild hyperinflation, cardiomegaly, otherwise unremarkable.  Medication:    Current Outpatient Medications:  .  aspirin 81 MG tablet, Take 1 tablet by mouth daily., Disp: , Rfl:  .  atorvastatin (LIPITOR) 40 MG tablet, Take 1 tablet (40 mg total) by  mouth daily at 6 PM., Disp: 90 tablet, Rfl: 1 .  cloNIDine (CATAPRES) 0.1 MG tablet, Take 1 tablet (0.1 mg total) by mouth 2 (two) times daily., Disp: 60 tablet, Rfl: 5 .  gabapentin (NEURONTIN) 400 MG capsule, Take 2 capsules (800 mg total) by mouth 3 (three) times daily., Disp: 120 capsule, Rfl: 2 .  glucose blood test strip, Use as instructed, Disp: 50 each, Rfl: 12 .  HYDROcodone-acetaminophen (NORCO/VICODIN) 5-325 MG tablet, Take 1 tablet by mouth every 6 (six) hours as needed for moderate pain or severe pain. (Patient not taking: Reported on 04/12/2018), Disp: 20 tablet, Rfl: 0 .  metFORMIN (GLUCOPHAGE-XR) 500 MG 24 hr tablet, Take 1 tablet (500 mg total) by mouth 2 (two) times daily., Disp: 180 tablet, Rfl: 1 .  Omega-3 Fatty Acids (FISH OIL) 1000 MG CAPS, Take 2 capsules by mouth daily., Disp: , Rfl:  .  omeprazole (PRILOSEC) 40 MG capsule, Take 1 capsule (40 mg total) by mouth daily., Disp: 90 capsule, Rfl: 1 .  tamsulosin (FLOMAX) 0.4 MG CAPS capsule, Take 1 capsule (0.4 mg total) by mouth daily. (Patient not taking: Reported on 04/12/2018), Disp: 10 capsule, Rfl: 0 .  telmisartan (MICARDIS) 40 MG tablet, Take 1 tablet (40 mg total) by mouth daily., Disp: 90 tablet, Rfl: 1 .  traZODone (DESYREL) 150 MG tablet, Take 1 tablet (150 mg total)  by mouth at bedtime., Disp: 30 tablet, Rfl: 5 .  umeclidinium-vilanterol (ANORO ELLIPTA) 62.5-25 MCG/INH AEPB, Inhale 1 puff into the lungs daily., Disp: 1 each, Rfl: 0   Allergies:  Ace inhibitors and Diphenhydramine  Review of Systems:  Constitutional: Feels well. Cardiovascular: No chest pain.  Pulmonary: Denies dyspnea.   The remainder of systems were reviewed and were found to be negative other than what is documented in the HPI.    Physical Examination:   VS: BP 112/62 (BP Location: Left Arm, Cuff Size: Normal)   Pulse (!) 59   Resp 16   Ht 5\' 10"  (1.778 m)   Wt 219 lb (99.3 kg)   SpO2 97%   BMI 31.42 kg/m   General Appearance: No  distress  Neuro:without focal findings, mental status, speech normal, alert and oriented HEENT: PERRLA, EOM intact Pulmonary: No wheezing, No rales  CardiovascularNormal S1,S2.  No m/r/g.  Abdomen: Benign, Soft, non-tender, No masses Renal:  No costovertebral tenderness  GU:  No performed at this time. Endoc: No evident thyromegaly, no signs of acromegaly or Cushing features Skin:   warm, no rashes, no ecchymosis  Extremities: normal, no cyanosis, clubbing.      LABORATORY PANEL:   CBC No results for input(s): WBC, HGB, HCT, PLT in the last 168 hours. ------------------------------------------------------------------------------------------------------------------  Chemistries  No results for input(s): NA, K, CL, CO2, GLUCOSE, BUN, CREATININE, CALCIUM, MG, AST, ALT, ALKPHOS, BILITOT in the last 168 hours.  Invalid input(s): GFRCGP ------------------------------------------------------------------------------------------------------------------  Cardiac Enzymes No results for input(s): TROPONINI in the last 168 hours. ------------------------------------------------------------  RADIOLOGY:  No results found.     Thank  you for the consultation and for allowing [facility] to assist in the care of your patient. Our recommendations are noted above.  Please contact us if we can be of further service.  Marda Stalker, M.D., F.C.C.P.  Board Certified in Internal Medicine, Pulmonary Medicine, [place], and Sleep Medicine.  [facility] Office Number: [phone]   06/08/2018

## 2018-06-09 ENCOUNTER — Encounter: Payer: Self-pay | Admitting: Internal Medicine

## 2018-06-09 ENCOUNTER — Ambulatory Visit (INDEPENDENT_AMBULATORY_CARE_PROVIDER_SITE_OTHER): Payer: Medicare HMO | Admitting: Internal Medicine

## 2018-06-09 VITALS — BP 112/62 | HR 59 | Resp 16 | Ht 70.0 in | Wt 219.0 lb

## 2018-06-09 DIAGNOSIS — J449 Chronic obstructive pulmonary disease, unspecified: Secondary | ICD-10-CM

## 2018-06-09 DIAGNOSIS — G4733 Obstructive sleep apnea (adult) (pediatric): Secondary | ICD-10-CM | POA: Diagnosis not present

## 2018-06-09 NOTE — Patient Instructions (Signed)
Continue using Anoro.

## 2018-06-12 ENCOUNTER — Ambulatory Visit: Payer: Self-pay | Admitting: Family Medicine

## 2018-06-12 ENCOUNTER — Encounter: Payer: Self-pay | Admitting: Family Medicine

## 2018-06-12 ENCOUNTER — Ambulatory Visit (INDEPENDENT_AMBULATORY_CARE_PROVIDER_SITE_OTHER): Payer: Medicare HMO | Admitting: Family Medicine

## 2018-06-12 VITALS — BP 118/64 | HR 57 | Temp 98.6°F | Wt 219.0 lb

## 2018-06-12 DIAGNOSIS — I10 Essential (primary) hypertension: Secondary | ICD-10-CM

## 2018-06-12 DIAGNOSIS — E119 Type 2 diabetes mellitus without complications: Secondary | ICD-10-CM

## 2018-06-12 DIAGNOSIS — E785 Hyperlipidemia, unspecified: Secondary | ICD-10-CM

## 2018-06-12 DIAGNOSIS — Z6831 Body mass index (BMI) 31.0-31.9, adult: Secondary | ICD-10-CM

## 2018-06-12 DIAGNOSIS — E669 Obesity, unspecified: Secondary | ICD-10-CM | POA: Diagnosis not present

## 2018-06-12 LAB — POCT GLYCOSYLATED HEMOGLOBIN (HGB A1C): HEMOGLOBIN A1C: 6 % — AB (ref 4.0–5.6)

## 2018-06-12 MED ORDER — TELMISARTAN 40 MG PO TABS: 40.0000 mg | ORAL_TABLET | Freq: Every day | ORAL | 1 refills | Status: DC

## 2018-06-12 NOTE — Assessment & Plan Note (Signed)
Discussed importance of healthy weight Discussed diet and exercise

## 2018-06-12 NOTE — Assessment & Plan Note (Signed)
Last LDL at goal Tolerating statin well - will continue Goal LDL <70 given h/o MI, CAD s/p CABG

## 2018-06-12 NOTE — Patient Instructions (Addendum)
No changes to medicines  Schedule an eye exam (need one once per year for diabetes)  Bring [facility] slip to Urologist to see if they'll draw blood for me as well when getting their's  Come fasting to Physical in February

## 2018-06-12 NOTE — Assessment & Plan Note (Signed)
Well controlled Continue metformin at current dose UTD on vaccinations, foot exam Advised to schedule eye exam On ARB

## 2018-06-12 NOTE — Assessment & Plan Note (Signed)
Well controlled Continue current meds Recheck BMP

## 2018-06-12 NOTE — Progress Notes (Signed)
Patient: Juan Le. Male    DOB: 1946/02/22   72 y.o.   MRN: [id number] Visit Date: 06/12/2018  Today's Provider: [name], MD   Chief Complaint  Patient presents with  . Diabetes  . Hypertension  . Hyperlipidemia   Subjective:    HPI I, Juan Le, CMA, am acting as a Education administrator for [name], MD.   Diabetes Mellitus Type II, Follow-up:   Lab Results  Component Value Date   HGBA1C 6.0 (A) 06/12/2018   HGBA1C 5.4 11/29/2017   HGBA1C 6.1 08/30/2017   Last seen for diabetes 3 months ago.  Management since then includes continue current medications. He reports good compliance with treatment. He is not having side effects.  Current symptoms include none  Home blood sugar records: fasting range: 140's  Episodes of hypoglycemia? no   Current Insulin Regimen: none Most Recent Eye Exam: not UTD Weight trend: stable Current diet: diabetic Current exercise: walking  ------------------------------------------------------------------------   Hypertension, follow-up:  BP Readings from Last 3 Encounters:  06/12/18 118/64  06/09/18 112/62  04/12/18 122/78    He was last seen for hypertension 3 months ago.  BP at that visit was 112/62. Management since that visit includes continue medications.He reports good compliance with treatment. He is not having side effects.  He is exercising. He is adherent to low salt diet.   Outside blood pressures are being checked. He is experiencing none.  Patient denies chest pain, chest pressure/discomfort, claudication, dyspnea, exertional chest pressure/discomfort, fatigue, irregular heart beat, lower extremity edema, near-syncope, orthopnea, palpitations, paroxysmal nocturnal dyspnea, syncope and tachypnea.   Cardiovascular risk factors include advanced age (older than 25 for men, 2 for women), diabetes mellitus, dyslipidemia and male gender.  Use of agents associated with hypertension: none.    ------------------------------------------------------------------------    Lipid/Cholesterol, Follow-up:   Last seen for this 3 months ago.  Management since that visit includes continue medication.  Last Lipid Panel:    Component Value Date/Time   CHOL 126 11/29/2017 0936   CHOL 143 02/05/2016 1021   TRIG 182 (H) 11/29/2017 0936   HDL 32 (L) 11/29/2017 0936   HDL 36 (L) 02/05/2016 1021   CHOLHDL 3.9 11/29/2017 0936   VLDL 42 (H) 04/21/2017 0852   LDLCALC 68 11/29/2017 0936    He reports good compliance with treatment. He is not having side effects.   Wt Readings from Last 3 Encounters:  06/12/18 219 lb (99.3 kg)  06/09/18 219 lb (99.3 kg)  04/12/18 220 lb 6.4 oz (100 kg)    ------------------------------------------------------------------------     Allergies  Allergen Reactions  . Ace Inhibitors Cough  . Diphenhydramine     stomach upset     Current Outpatient Medications:  .  aspirin 81 MG tablet, Take 1 tablet by mouth daily., Disp: , Rfl:  .  atorvastatin (LIPITOR) 40 MG tablet, Take 1 tablet (40 mg total) by mouth daily at 6 PM., Disp: 90 tablet, Rfl: 1 .  cloNIDine (CATAPRES) 0.1 MG tablet, Take 1 tablet (0.1 mg total) by mouth 2 (two) times daily., Disp: 60 tablet, Rfl: 5 .  gabapentin (NEURONTIN) 400 MG capsule, Take 2 capsules (800 mg total) by mouth 3 (three) times daily., Disp: 120 capsule, Rfl: 2 .  glucose blood test strip, Use as instructed, Disp: 50 each, Rfl: 12 .  metFORMIN (GLUCOPHAGE-XR) 500 MG 24 hr tablet, Take 1 tablet (500 mg total) by mouth 2 (two) times daily., Disp: 180 tablet,  Rfl: 1 .  Omega-3 Fatty Acids (FISH OIL) 1000 MG CAPS, Take 2 capsules by mouth daily., Disp: , Rfl:  .  omeprazole (PRILOSEC) 40 MG capsule, Take 1 capsule (40 mg total) by mouth daily., Disp: 90 capsule, Rfl: 1 .  tamsulosin (FLOMAX) 0.4 MG CAPS capsule, Take 1 capsule (0.4 mg total) by mouth daily., Disp: 10 capsule, Rfl: 0 .  telmisartan (MICARDIS) 40 MG  tablet, Take 1 tablet (40 mg total) by mouth daily., Disp: 90 tablet, Rfl: 1 .  traZODone (DESYREL) 150 MG tablet, Take 1 tablet (150 mg total) by mouth at bedtime., Disp: 30 tablet, Rfl: 5 .  umeclidinium-vilanterol (ANORO ELLIPTA) 62.5-25 MCG/INH AEPB, Inhale 1 puff into the lungs daily., Disp: 1 each, Rfl: 0  Review of Systems  Constitutional: Negative.   Respiratory: Negative.   Cardiovascular: Negative.   Endocrine: Negative.   Musculoskeletal: Negative.     Social History   Tobacco Use  . Smoking status: Former Smoker    Types: Cigarettes  . Smokeless tobacco: Never Used  . Tobacco comment: former light smoker; quit in his teens  Substance Use Topics  . Alcohol use: No    Alcohol/week: 0.0 standard drinks   Objective:   BP 118/64 (BP Location: Right Arm, Patient Position: Sitting, Cuff Size: Normal)   Pulse (!) 57   Temp 98.6 F (37 C) (Oral)   Wt 219 lb (99.3 kg)   SpO2 97%   BMI 31.42 kg/m  Vitals:   06/12/18 0810  BP: 118/64  Pulse: (!) 57  Temp: 98.6 F (37 C)  TempSrc: Oral  SpO2: 97%  Weight: 219 lb (99.3 kg)     Physical Exam  Constitutional: He is oriented to person, place, and time. He appears well-developed and well-nourished. No distress.  HENT:  Head: Normocephalic and atraumatic.  Eyes: Conjunctivae are normal. No scleral icterus.  Neck: Neck supple. No thyromegaly present.  Cardiovascular: Normal rate, regular rhythm, normal heart sounds and intact distal pulses.  No murmur heard. Pulmonary/Chest: Effort normal and breath sounds normal. No respiratory distress. He has no wheezes. He has no rales.  Musculoskeletal: He exhibits no edema.  Lymphadenopathy:    He has no cervical adenopathy.  Neurological: He is alert and oriented to person, place, and time.  Skin: Skin is warm and dry. Capillary refill takes less than 2 seconds. No rash noted.  Psychiatric: He has a normal mood and affect. His behavior is normal.  Vitals  reviewed.    Results for orders placed or performed in visit on 06/12/18  POCT HgB A1C  Result Value Ref Range   Hemoglobin A1C 6.0 (A) 4.0 - 5.6 %   HbA1c POC (<> result, manual entry)     HbA1c, POC (prediabetic range)     HbA1c, POC (controlled diabetic range)         Assessment & Plan:   Problem List Items Addressed This Visit      Cardiovascular and Mediastinum   Hypertension - Primary    Well controlled Continue current meds Recheck BMP      Relevant Medications   telmisartan (MICARDIS) 40 MG tablet   Other Relevant Orders   Basic Metabolic Panel (BMET)     Endocrine   Type 2 diabetes mellitus without complications ([place])    Well controlled Continue metformin at current dose UTD on vaccinations, foot exam Advised to schedule eye exam On ARB      Relevant Medications   telmisartan (MICARDIS) 40 MG tablet  Other Relevant Orders   POCT HgB A1C (Completed)     Other   Dyslipidemia    Last LDL at goal Tolerating statin well - will continue Goal LDL <70 given h/o MI, CAD s/p CABG      Class 1 obesity with serious comorbidity and body mass index (BMI) of 31.0 to 31.9 in adult    Discussed importance of healthy weight Discussed diet and exercise          Return in about 6 months (around 12/13/2018) for AWV/CPE.   The entirety of the information documented in the History of Present Illness, Review of Systems and Physical Exam were personally obtained by me. Portions of this information were initially documented by Juan Le, CMA and reviewed by me for thoroughness and accuracy.    [name], MD, MPH [facility] 06/12/2018 8:55 AM

## 2018-06-16 ENCOUNTER — Ambulatory Visit
Admission: RE | Admit: 2018-06-16 | Discharge: 2018-06-16 | Disposition: A | Discharge status: Home / Self Care | Payer: Medicare HMO | Source: Ambulatory Visit | Attending: Urology | Admitting: Urology

## 2018-06-16 DIAGNOSIS — R3129 Other microscopic hematuria: Secondary | ICD-10-CM | POA: Diagnosis not present

## 2018-06-16 DIAGNOSIS — N281 Cyst of kidney, acquired: Secondary | ICD-10-CM | POA: Diagnosis not present

## 2018-06-16 DIAGNOSIS — I700 Atherosclerosis of aorta: Secondary | ICD-10-CM | POA: Insufficient documentation

## 2018-06-16 LAB — POCT I-STAT CREATININE: Creatinine, Ser: 1.2 mg/dL (ref 0.61–1.24)

## 2018-06-16 MED ORDER — IOPAMIDOL (ISOVUE-300) INJECTION 61%
125.0000 mL | Freq: Once | INTRAVENOUS | Status: AC | PRN
  Administered 2018-06-16: 125 mL via INTRAVENOUS

## 2018-06-22 ENCOUNTER — Ambulatory Visit (INDEPENDENT_AMBULATORY_CARE_PROVIDER_SITE_OTHER): Payer: Medicare HMO | Admitting: Urology

## 2018-06-22 ENCOUNTER — Telehealth: Payer: Self-pay | Admitting: Urology

## 2018-06-22 ENCOUNTER — Encounter: Payer: Self-pay | Admitting: Urology

## 2018-06-22 VITALS — BP 156/85 | HR 59 | Temp 97.5°F | Resp 14 | Ht 70.0 in | Wt 219.0 lb

## 2018-06-22 DIAGNOSIS — Z87448 Personal history of other diseases of urinary system: Secondary | ICD-10-CM | POA: Diagnosis not present

## 2018-06-22 DIAGNOSIS — N281 Cyst of kidney, acquired: Secondary | ICD-10-CM | POA: Diagnosis not present

## 2018-06-22 DIAGNOSIS — N138 Other obstructive and reflux uropathy: Secondary | ICD-10-CM

## 2018-06-22 DIAGNOSIS — R972 Elevated prostate specific antigen [PSA]: Secondary | ICD-10-CM

## 2018-06-22 DIAGNOSIS — N401 Enlarged prostate with lower urinary tract symptoms: Secondary | ICD-10-CM

## 2018-06-22 LAB — URINALYSIS, COMPLETE
Bilirubin, UA: NEGATIVE
Glucose, UA: NEGATIVE
Ketones, UA: NEGATIVE
LEUKOCYTES UA: NEGATIVE
NITRITE UA: NEGATIVE
Protein, UA: NEGATIVE
Specific Gravity, UA: 1.025 (ref 1.005–1.030)
Urobilinogen, Ur: 0.2 mg/dL (ref 0.2–1.0)
pH, UA: 5 (ref 5.0–7.5)

## 2018-06-22 LAB — MICROSCOPIC EXAMINATION: EPITHELIAL CELLS (NON RENAL): NONE SEEN /HPF (ref 0–10)

## 2018-06-22 NOTE — Progress Notes (Signed)
06/22/2018 8:46 AM   [name] 1946-01-20 [id number]  Referring provider: [name], MD [address] [place], [place] [address]  Chief Complaint  Patient presents with  . Follow-up   HPI: Patient is a 72 year old Caucasian male with an increasing PSA velocity, BPH with LUTS, microscopic hematuria and renal cysts who returns to discuss CTU results.  Increase in PSA velocity PSA Trend  3.4 in 04/18  4.9 in 06/19  5.4 in 04/2018  7.8 in 05/2018  BPH WITH LUTS  (prostate and/or bladder) Previous IPSS score: 5/2.  Previous PVR: 71 mL  Major complaint(s):  Frequency and nocturia x  years. Denies any dysuria, hematuria or suprapubic pain.   Currently taking: tamsulosin 0.4 mg daily  Denies any recent fevers, chills, nausea or vomiting.   Score:  1-7 Mild 8-19 Moderate 20-35 Severe    Microscopic hematuria  CTU on 06/16/2018 revealed no explanation for hematuria. No nephrolithiasis, ureterolithiasis, enhancing renal cortical lesion, or filling defects within the collecting systems.  Bilateral Bosniak 1 renal cysts.  No bladder stones or filling defects in the bladder which does not excluded a bladder lesion.  Aortic Atherosclerosis.  Left ureter was not completely opacified on study.    Renal cysts Bilateral bosniak 1 renal cysts on 05/2018 CTU.   PMH: Past Medical History:  Diagnosis Date  . Anxiety   . Atelectasis of left lung 03/25/2017  . Chronic, continuous use of opioids   . COPD (chronic obstructive pulmonary disease) ([place])   . Degenerative disc disease, lumbar   . Diabetes ([place])   . DJD (degenerative joint disease)   . GERD (gastroesophageal reflux disease)   . Heart attack ([place])   . History of diverticulitis   . Hypertension   . Hypertension   . Hypertriglyceridemia   . Hypokalemia   . Insomnia   . Mitral regurgitation   . Sleep apnea   . Vitamin D deficiency   . Wears dentures    full upper    Surgical  History: Past Surgical History:  Procedure Laterality Date  . COLONOSCOPY WITH PROPOFOL N/A 01/28/2017   Procedure: COLONOSCOPY WITH PROPOFOL;  Surgeon: [name], MD;  Location: [facility];  Service: Endoscopy;  Laterality: N/A;  diabetic - oral meds  . CORONARY ARTERY BYPASS GRAFT  1997  . CORONARY STENT PLACEMENT  11/2006  . FOOT SURGERY  2003  . HERNIA REPAIR    . POLYPECTOMY N/A 01/28/2017   Procedure: POLYPECTOMY;  Surgeon: [name], MD;  Location: [facility];  Service: Endoscopy;  Laterality: N/A;  . UMBILICAL HERNIA REPAIR  04/2012    Home Medications:  Allergies as of 06/22/2018      Reactions   Ace Inhibitors Cough   Diphenhydramine    stomach upset      Medication List        Accurate as of 06/22/18 11:59 PM. Always use your most recent med list.          aspirin 81 MG tablet Take 1 tablet by mouth daily.   atorvastatin 40 MG tablet Commonly known as:  LIPITOR Take 1 tablet (40 mg total) by mouth daily at 6 PM.   cloNIDine 0.1 MG tablet Commonly known as:  CATAPRES Take 1 tablet (0.1 mg total) by mouth 2 (two) times daily.   Fish Oil 1000 MG Caps Take 2 capsules by mouth daily.   gabapentin 400 MG capsule Commonly known as:  NEURONTIN Take  2 capsules (800 mg total) by mouth 3 (three) times daily.   glucose blood test strip Use as instructed   metFORMIN 500 MG 24 hr tablet Commonly known as:  GLUCOPHAGE-XR Take 1 tablet (500 mg total) by mouth 2 (two) times daily.   omeprazole 40 MG capsule Commonly known as:  PRILOSEC Take 1 capsule (40 mg total) by mouth daily.   tamsulosin 0.4 MG Caps capsule Commonly known as:  FLOMAX Take 1 capsule (0.4 mg total) by mouth daily.   telmisartan 40 MG tablet Commonly known as:  MICARDIS Take 1 tablet (40 mg total) by mouth daily.   traZODone 150 MG tablet Commonly known as:  DESYREL Take 1 tablet (150 mg total) by mouth at bedtime.   umeclidinium-vilanterol 62.5-25 MCG/INH  Aepb Commonly known as:  ANORO ELLIPTA Inhale 1 puff into the lungs daily.       Allergies:  Allergies  Allergen Reactions  . Ace Inhibitors Cough  . Diphenhydramine     stomach upset    Family History: Family History  Problem Relation Age of Onset  . Throat cancer Father   . Colon cancer Father   . Heart disease Father   . Cancer Father   . Depression Father   . Hypertension Father   . Parkinson's disease Father   . Arthritis Brother   . Stroke Mother   . Kidney disease Mother   . Heart disease Mother   . Kidney failure Mother   . Breast cancer Paternal Aunt   . Depression Sister   . Stroke Maternal Grandmother   . Bone cancer Brother   . Stroke Brother   . Prostate cancer Neg Hx   . Kidney cancer Neg Hx   . Bladder Cancer Neg Hx     Social History:  reports that he has quit smoking. His smoking use included cigarettes. He has never used smokeless tobacco. He reports that he does not drink alcohol or use drugs.  ROS: UROLOGY Frequent Urination?: Yes Hard to postpone urination?: No Burning/pain with urination?: No Get up at night to urinate?: Yes Leakage of urine?: No Urine stream starts and stops?: No Trouble starting stream?: No Do you have to strain to urinate?: No Blood in urine?: No Urinary tract infection?: No Sexually transmitted disease?: No Injury to kidneys or bladder?: No Painful intercourse?: No Weak stream?: No Erection problems?: No Penile pain?: No  Gastrointestinal Nausea?: No Vomiting?: No Indigestion/heartburn?: No Diarrhea?: No Constipation?: No  Constitutional Fever: No Night sweats?: No Weight loss?: No  Skin Skin rash/lesions?: No Itching?: No  Eyes Blurred vision?: No Double vision?: No  Ears/Nose/Throat Sore throat?: No Sinus problems?: No  Hematologic/Lymphatic Swollen glands?: No Easy bruising?: No  Cardiovascular Leg swelling?: No Chest pain?: No  Respiratory Cough?: No Shortness of breath?:  No  Endocrine Excessive thirst?: No  Musculoskeletal Back pain?: No Joint pain?: No  Neurological Headaches?: No Dizziness?: No  Psychologic Depression?: No Anxiety?: No  Physical Exam: Blood pressure (!) 156/85, pulse (!) 59, temperature (!) 97.5 F (36.4 C), resp. rate 14, height 5\' 10"  (1.778 m), weight 219 lb (99.3 kg). Constitutional: Well nourished. Alert and oriented, No acute distress. HEENT: [place] AT, moist mucus membranes. Trachea midline, no masses. Cardiovascular: No clubbing, cyanosis, or edema. Respiratory: Normal respiratory effort, no increased work of breathing. Skin: No rashes, bruises or suspicious lesions. Lymph: No cervical or inguinal adenopathy. Neurologic: Grossly intact, no focal deficits, moving all 4 extremities. Psychiatric: Normal mood and affect.  Laboratory Data:  Lab Results  Component Value Date   WBC 7.1 03/24/2017   HGB 14.5 03/24/2017   HCT 42.3 03/24/2017   MCV 94.0 03/24/2017   PLT 159 03/24/2017    Lab Results  Component Value Date   CREATININE 1.20 06/16/2018    Lab Results  Component Value Date   PSA 3.4 01/17/2017    No results found for: TESTOSTERONE  Lab Results  Component Value Date   HGBA1C 6.0 (A) 06/12/2018    Lab Results  Component Value Date   TSH 2.60 01/17/2017       Component Value Date/Time   CHOL 126 11/29/2017 0936   CHOL 143 02/05/2016 1021   HDL 32 (L) 11/29/2017 0936   HDL 36 (L) 02/05/2016 1021   CHOLHDL 3.9 11/29/2017 0936   VLDL 42 (H) 04/21/2017 0852   LDLCALC 68 11/29/2017 0936    Lab Results  Component Value Date   AST 23 11/29/2017   Lab Results  Component Value Date   ALT 22 11/29/2017   No components found for: ALKALINEPHOPHATASE No components found for: BILIRUBINTOTAL  No results found for: ESTRADIOL   I have reviewed the labs.  Pertinent Imaging: CLINICAL DATA:  Urinary frequency. Hematuria. Microscopic hematuria.  EXAM: CT ABDOMEN AND PELVIS WITHOUT AND  WITH CONTRAST  TECHNIQUE: Multidetector CT imaging of the abdomen and pelvis was performed following the standard protocol before and following the bolus administration of intravenous contrast.  CONTRAST:  191mL ISOVUE-300 IOPAMIDOL (ISOVUE-300) INJECTION 61%  COMPARISON:  CT 03/30/2018  FINDINGS: Lower chest: Lung bases are clear.  Hepatobiliary: No focal hepatic lesion. No biliary duct dilatation. Gallbladder is normal. Common bile duct is normal.  Pancreas: Pancreas is normal. No ductal dilatation. No pancreatic inflammation.  Spleen: Normal spleen  Adrenals/urinary tract: Adrenal glands normal. No nephrolithiasis or ureterolithiasis.  Low-density cysts in the mid LEFT kidney measures 3.3 cm. Low-density nonenhancing cyst lower pole of the RIGHT kidney measures 12 mm. No enhancing renal cortical lesion. Delayed imaging demonstrates no filling defects within collecting systems ureters.  No bladder calculi, enhancing bladder lesions, or filling defect within the bladder.  Stomach/Bowel: Stomach, small bowel, appendix, and cecum are normal. Scattered diverticula throughout the colon without acute inflammation.  Vascular/Lymphatic: Abdominal aorta is normal caliber with atherosclerotic calcification. There is no retroperitoneal or periportal lymphadenopathy. No pelvic lymphadenopathy.  Reproductive: Prostate normal  Other: Small bilateral fat filled inguinal hernias.  Musculoskeletal: No aggressive osseous lesion.  IMPRESSION: 1. No explanation for hematuria. No nephrolithiasis, ureterolithiasis, enhancing renal cortical lesion, or filling defects within the collecting systems. 2. Bilateral Bosniak 1 renal cysts. 3. No bladder stones or filling defects in the bladder which does not excluded a bladder lesion. 4.  Aortic Atherosclerosis (ICD10-I70.0).   Electronically Signed   By: Suzy Bouchard M.D.   On: 06/16/2018 13:05 I have  independently reviewed the films  Assessment & Plan:    1. Rising PSA velocity Discussed the rising PSA with Dr. Diamantina Providence as this is an unusual finding We will initiate Cipro 250 mg twice daily for 3 weeks When patient presents for his cystoscopically we will obtain a free and total PSA prior to introducing the scope to see if the regimen of antibiotics has any effect on the PSA We will need to monitor this closely as this may be an unusual presentation of prostate cancer  2. Microscopic hematuria CTU did not identify a cause for hematuria I did discuss with the patient that the left ureter did not opacified  so there is a risk of missing a urological tumor, but I explained to him with his comorbidities it would not be safe to take him to the operating room at this time to perform a cystoscopy with bilateral retrogrades I advised him to have the cystoscopy in the office to evaluate the inside of his bladder and also the effect his prostate may be having on his bladder and if there are findings after the cystoscopy that make it necessary to take him to the operating room we can certainly perform retrogrades at that time - he is he is agreeable I have explained to the patient that they will  be scheduled for a cystoscopy in our office to evaluate their bladder.  The cystoscopy consists of passing a tube with a lens up through their urethra and into their urinary bladder.   We will inject the urethra with a lidocaine gel prior to introducing the cystoscope to help with any discomfort during the procedure.   After the procedure, they might experience blood in the urine and discomfort with urination.  This will abate after the first few voids.  I have  encouraged the patient to increase water intake  during this time.  Patient denies any allergies to lidocaine.  UA positive for 3-10 RBC's    3. BPH with LU TS I PSS score is 5/2 Continue conservative management, avoiding bladder irritants and timed  voiding's  4. Renal cysts Bosniak 1 cyst - no further monitoring required  Return for cystoscopy for hematuria .  These notes generated with voice recognition software. I apologize for typographical errors.  Zara Council, PA-C  [facility] [address] [place]  [place], [place] [address] [phone]

## 2018-06-22 NOTE — Telephone Encounter (Signed)
Yes ma'am I will have Manuela Schwartz take care of it.   Sharyn Lull

## 2018-06-22 NOTE — Telephone Encounter (Signed)
Wouldyou please cancel the patient's renal ultrasound on 19 September?  He just had a CT.

## 2018-06-26 LAB — CULTURE, URINE COMPREHENSIVE

## 2018-06-27 ENCOUNTER — Ambulatory Visit: Payer: Medicare HMO

## 2018-06-28 ENCOUNTER — Other Ambulatory Visit: Payer: Self-pay | Admitting: Family Medicine

## 2018-06-28 MED ORDER — UMECLIDINIUM-VILANTEROL 62.5-25 MCG/INH IN AEPB: 1.0000 | INHALATION_SPRAY | Freq: Every day | RESPIRATORY_TRACT | 3 refills | Status: DC

## 2018-06-28 NOTE — Telephone Encounter (Signed)
Pt calling to request a refill on the following medication. Pt uses's [facility]. Thanks CC.  umeclidinium-vilanterol (ANORO ELLIPTA) 62.5-25 MCG/INH AEPB

## 2018-06-29 ENCOUNTER — Other Ambulatory Visit: Payer: Self-pay | Admitting: Family Medicine

## 2018-06-29 ENCOUNTER — Telehealth: Payer: Self-pay | Admitting: Internal Medicine

## 2018-06-29 DIAGNOSIS — M792 Neuralgia and neuritis, unspecified: Secondary | ICD-10-CM

## 2018-06-29 MED ORDER — GABAPENTIN 400 MG PO CAPS: 800.0000 mg | ORAL_CAPSULE | Freq: Three times a day (TID) | ORAL | 2 refills | Status: DC

## 2018-06-29 MED ORDER — UMECLIDINIUM-VILANTEROL 62.5-25 MCG/INH IN AEPB: 1.0000 | INHALATION_SPRAY | Freq: Every day | RESPIRATORY_TRACT | 3 refills | Status: DC

## 2018-06-29 NOTE — Telephone Encounter (Signed)
Refill submitted to pharmacy.

## 2018-06-29 NOTE — Telephone Encounter (Signed)
°*  STAT* If patient is at the pharmacy, call can be transferred to refill team.   1. Which medications need to be refilled? (please list name of each medication and dose if known) Anoro   2. Which pharmacy/location (including street and city if local pharmacy) is medication to be sent to? [address] [facility] in [place]   3. Do they need a 30 day or 90 day supply?

## 2018-06-29 NOTE — Telephone Encounter (Signed)
Pt needs a refill on   Gabapentin 400mg   [place]  CB#  [phone]  thanks Con Memos

## 2018-07-05 ENCOUNTER — Ambulatory Visit: Payer: Self-pay | Admitting: Family Medicine

## 2018-07-13 ENCOUNTER — Telehealth: Payer: Self-pay | Admitting: Urology

## 2018-07-13 DIAGNOSIS — R972 Elevated prostate specific antigen [PSA]: Secondary | ICD-10-CM

## 2018-07-13 MED ORDER — CIPROFLOXACIN HCL 250 MG PO TABS: 250.0000 mg | ORAL_TABLET | Freq: Two times a day (BID) | ORAL | 0 refills | Status: DC

## 2018-07-13 NOTE — Telephone Encounter (Signed)
I left a detailed message on patient's voicemail regarding our plan to address his elevating PSA.  I have sent Cipro 250 mg twice daily for 3 weeks to his pharmacy.  He needs to start that as soon as possible, hopefully by today.  We will also need to draw a free and total PSA the day of his cystoscopy for a recheck.  I explained on the voicemail that the elevation PSA may be due to inflammatory processes and hopefully the antibiotic will return to baseline, but we could not be absolutely certain that this was not due to prostate cancer and we need to follow him closely.  I do want to be notified that the patient has returned my message and has picked up the Cipro and started the antibiotic.    I have placed the orders in for the free and total PSA for the patient.

## 2018-07-13 NOTE — Telephone Encounter (Signed)
Patient called back and confirmed he received the message and will start ABX.

## 2018-07-13 NOTE — Telephone Encounter (Signed)
See other note

## 2018-07-31 ENCOUNTER — Encounter: Payer: Self-pay | Admitting: Urology

## 2018-07-31 ENCOUNTER — Ambulatory Visit (INDEPENDENT_AMBULATORY_CARE_PROVIDER_SITE_OTHER): Payer: Medicare HMO | Admitting: Urology

## 2018-07-31 VITALS — BP 131/70 | HR 58 | Ht 70.0 in | Wt 220.4 lb

## 2018-07-31 DIAGNOSIS — R3129 Other microscopic hematuria: Secondary | ICD-10-CM

## 2018-07-31 DIAGNOSIS — R972 Elevated prostate specific antigen [PSA]: Secondary | ICD-10-CM

## 2018-07-31 DIAGNOSIS — Z87448 Personal history of other diseases of urinary system: Secondary | ICD-10-CM | POA: Diagnosis not present

## 2018-07-31 DIAGNOSIS — R8290 Unspecified abnormal findings in urine: Secondary | ICD-10-CM

## 2018-07-31 LAB — URINALYSIS, COMPLETE
Bilirubin, UA: NEGATIVE
Glucose, UA: NEGATIVE
Ketones, UA: NEGATIVE
LEUKOCYTES UA: NEGATIVE
NITRITE UA: NEGATIVE
PROTEIN UA: NEGATIVE
SPEC GRAV UA: 1.025 (ref 1.005–1.030)
Urobilinogen, Ur: 0.2 mg/dL (ref 0.2–1.0)
pH, UA: 5 (ref 5.0–7.5)

## 2018-07-31 MED ORDER — LIDOCAINE HCL URETHRAL/MUCOSAL 2 % EX GEL
1.0000 "application " | Freq: Once | CUTANEOUS | Status: AC
  Administered 2018-07-31: 1 via URETHRAL

## 2018-07-31 NOTE — Progress Notes (Signed)
Cystoscopy Procedure Note:  Indication: Microscopic hematuria  After informed consent and discussion of the procedure and its risks, Juan Le. was positioned and prepped in the standard fashion. Cystoscopy was performed with a flexible cystoscope. The urethra, bladder neck and entire bladder was visualized in a standard fashion. The prostate was moderate in size. The ureteral orifices were visualized in their normal location and orientation.  Bladder mucosa grossly normal, no concerning findings  Imaging: CT urogram personally reviewed, no renal masses, urolithiasis, or hydronephrosis  Findings: Normal cystoscopy  Assessment and Plan: [name] is a 72 year old male with microscopic hematuria, as well as rising PSA.  He did have a prior positive urine culture with staph.  He has very mild urinary symptoms, primarily nocturia 2-3 times per night.  He drinks quite a bit of diet Coke and Coke zero, and I suspect this is responsible for his frequency and nocturia.  We discussed behavioral strategies at length.  Regarding his PSA, we repeat a PSA today prior to cystoscopy, results pending.  Our PA Zara Council will call him with these results.  I did recommend biopsy if this remains persistently elevated despite treatment of his UTI.  Juan Madrid, MD 07/31/2018

## 2018-08-01 LAB — PSA: PROSTATE SPECIFIC AG, SERUM: 7.3 ng/mL — AB (ref 0.0–4.0)

## 2018-08-02 ENCOUNTER — Telehealth: Payer: Self-pay | Admitting: Urology

## 2018-08-02 NOTE — Telephone Encounter (Signed)
Pt has called lmom 2 since last message and just called again stated someone called him and he missed the call. Pt wants to speak to someone about his results. Please call pt back, states he will be waiting to answer the call.

## 2018-08-02 NOTE — Telephone Encounter (Signed)
Pt calling office requesting PSA results. Please advise pt at [phone].

## 2018-08-03 NOTE — Telephone Encounter (Signed)
Bartolo, thanks. I ordered a future PSA and will call him with the results

## 2018-08-03 NOTE — Telephone Encounter (Signed)
Patient notified of results and wants to complete his course of antibiotics and have a repeat PSA done before he decides to schedule the Biopsy. He states he will call to schedule the lab when he is finished with the ABX.

## 2018-08-03 NOTE — Addendum Note (Signed)
Addended by: Billey Co on: 08/03/2018 09:14 AM   Modules accepted: Orders

## 2018-08-03 NOTE — Telephone Encounter (Signed)
-----   Message from Billey Co, MD sent at 08/01/2018  9:32 AM EDT ----- PSA remains elevated at 7.3. I would recommend proceeding with biopsy as we discussed in clinic last week. Please review results and instructions with him, schedule for next prostate biopsy.  Thanks Nickolas Madrid, MD 08/01/2018

## 2018-08-15 ENCOUNTER — Telehealth: Payer: Self-pay | Admitting: Urology

## 2018-08-15 NOTE — Telephone Encounter (Signed)
Pt called office asking to speak to Dr. Versie Starks, Pt states for him to have Bx he wants to be put to sleep otherwise he doesn't want to have it done. Pt also states that if this is an option for him he would like to proceed asap. Please advise pt at [phone]. Thank you.

## 2018-08-16 ENCOUNTER — Telehealth: Payer: Self-pay | Admitting: Urology

## 2018-08-16 NOTE — Telephone Encounter (Signed)
Patient would like to talk to a clinical person about a prostate biopsy.  He can be reached at [phone].

## 2018-08-16 NOTE — Telephone Encounter (Signed)
Left pt mess to call 

## 2018-08-16 NOTE — Telephone Encounter (Signed)
Pt returned call and I told him we were waiting to hear from Dr Diamantina Providence concerning having a biopsy done in the OR and be put to sleep.

## 2018-08-16 NOTE — Telephone Encounter (Signed)
Pt called and left message stating no one is returining his calls concerning a biopsy.  [phone]

## 2018-08-17 ENCOUNTER — Telehealth: Payer: Self-pay | Admitting: Urology

## 2018-08-17 NOTE — Addendum Note (Signed)
Addended by: Billey Co on: 08/17/2018 09:17 AM   Modules accepted: Orders

## 2018-08-17 NOTE — Telephone Encounter (Signed)
apps made patient is aware  Sharyn Lull

## 2018-08-17 NOTE — Telephone Encounter (Signed)
[name] is a 72 year old male with rising PSA since June 2019 from 4.9 to a peak of 7.8.  He also had a possible staph epidermidis UTI in September 2019, this was treated with a 3-week course of Cipro by Juan Council, PA.  We discussed options at length including prostate biopsy versus recheck PSA now that he has been treated with antibiotics for possible UTI.  We discussed the controversy surrounding PSA, and additional factors including infection, trauma, ejaculations that can cause a false elevation.  He is in agreement to recheck PSA in mid November, with follow-up visit with me to discuss results.  If PSA normalizes, will discontinue screening per AUA guidelines, however if PSA remains significantly elevated we will likely proceed with prostate biopsy.  He is very resistant to undergoing prostate biopsy in clinic, and this may need to be scheduled in the OR.  Juan Madrid, MD 08/17/2018

## 2018-08-17 NOTE — Telephone Encounter (Signed)
-----   Message from Billey Co, MD sent at 08/17/2018  9:15 AM EDT ----- Regarding: clinic visit Please set up 15 minute clinic visit with me sometime between Nov 20-26. OK to overbook. Needs PSA prior (ordered).  Thanks Nickolas Madrid, MD 08/17/2018

## 2018-08-30 ENCOUNTER — Other Ambulatory Visit: Payer: Self-pay | Admitting: Family Medicine

## 2018-08-30 DIAGNOSIS — F99 Mental disorder, not otherwise specified: Principal | ICD-10-CM

## 2018-08-30 DIAGNOSIS — F5105 Insomnia due to other mental disorder: Secondary | ICD-10-CM

## 2018-08-30 MED ORDER — TRAZODONE HCL 150 MG PO TABS: 150.0000 mg | ORAL_TABLET | Freq: Every day | ORAL | 5 refills | Status: DC

## 2018-08-30 NOTE — Telephone Encounter (Signed)
Pt needing a refill on: traZODone (DESYREL) 150 MG tablet  Please fill at:  [facility], [place] - [place] [phone] (Phone) [phone] (Fax)    Thanks, [employer]

## 2018-09-04 ENCOUNTER — Other Ambulatory Visit: Payer: Medicare HMO

## 2018-09-04 DIAGNOSIS — R972 Elevated prostate specific antigen [PSA]: Secondary | ICD-10-CM | POA: Diagnosis not present

## 2018-09-05 LAB — FPSA% REFLEX
% FREE PSA: 33.7 %
PSA, FREE: 1.65 ng/mL

## 2018-09-05 LAB — PSA TOTAL (REFLEX TO FREE): Prostate Specific Ag, Serum: 4.9 ng/mL — ABNORMAL HIGH (ref 0.0–4.0)

## 2018-09-07 ENCOUNTER — Other Ambulatory Visit: Payer: Self-pay

## 2018-09-07 ENCOUNTER — Encounter: Payer: Self-pay | Admitting: Urology

## 2018-09-07 ENCOUNTER — Ambulatory Visit (INDEPENDENT_AMBULATORY_CARE_PROVIDER_SITE_OTHER): Payer: Medicare HMO | Admitting: Urology

## 2018-09-07 VITALS — BP 138/80 | HR 60 | Wt 225.0 lb

## 2018-09-07 DIAGNOSIS — Z125 Encounter for screening for malignant neoplasm of prostate: Secondary | ICD-10-CM

## 2018-09-07 NOTE — Progress Notes (Signed)
   09/07/2018 11:10 AM   [name] Nov 01, 1945 [id number]  Reason for visit: Follow up elevated PSA  HPI: I saw Juan Le back in clinic to discuss his recently elevated PSA.  He was previously being followed by our PA Zara Council was noted to have an increasing PSA from 3.4 in April 2018, 4.9 in June 2019, 5.4 and July 2019, and 7.8 in August 2019.  He also had a possible staph epidermidis UTI in September 2019.  This was treated with a course of antibiotics.  We previously discussed options including repeat PSA versus prostate biopsy, and he had elected for repeating the PSA.  He has no family history of prostate cancer.  Repeat PSA 09/04/2018 was 4.9, with 34% free PSA.    Assessment & Plan:   In summary, Juan Le is a 72 year old male who previously had a rising PSA which has since normalized.  This was likely secondary to a urinary tract infection.  His normal PSA of 4.9 for his age group, with 34% free is very reassuring.  We discussed at length the AUA guidelines recommending PSA screening for men between the ages of 50 and 20, and the reasons for discontinuing screening in his age group.  He is in agreement to stop PSA screening.  This is very reasonable.  He has minimally bothersome urinary urgency and frequency, and he drinks exclusively diet Coke and Coke 0.  We discussed behavioral modifications at length if his urinary symptoms are bothersome.  Follow-up in 1 year with PVR and to discuss urinary symptoms  10 minutes total were spent with the patient face-to-face, greater than 50% were spent in counseling and coordination of care regarding PSA screening and overactive urinary symptoms.  Billey Co, [facility] [address] [place], [place] [address] [phone]

## 2018-10-06 ENCOUNTER — Other Ambulatory Visit: Payer: Self-pay | Admitting: Family Medicine

## 2018-10-06 DIAGNOSIS — I10 Essential (primary) hypertension: Secondary | ICD-10-CM

## 2018-10-06 MED ORDER — CLONIDINE HCL 0.1 MG PO TABS: 0.1000 mg | ORAL_TABLET | Freq: Two times a day (BID) | ORAL | 5 refills | Status: DC

## 2018-10-06 NOTE — Telephone Encounter (Signed)
[facility] faxed refill request for the following medications:  cloNIDine (CATAPRES) 0.1 MG tablet   Date written: 04/04/2018  Last dispensed: 09/06/2018  Please advise.

## 2018-10-10 ENCOUNTER — Telehealth: Payer: Self-pay

## 2018-10-10 DIAGNOSIS — M792 Neuralgia and neuritis, unspecified: Secondary | ICD-10-CM

## 2018-10-10 MED ORDER — GABAPENTIN 400 MG PO CAPS: 800.0000 mg | ORAL_CAPSULE | Freq: Three times a day (TID) | ORAL | 0 refills | Status: DC

## 2018-10-10 NOTE — Telephone Encounter (Signed)
This is a Dr.B patient. Patient calling requesting refills for the following medication: gabapentin (NEURONTIN) 400 MG capsule   Pharmacy: [facility] in [place]

## 2018-10-10 NOTE — Telephone Encounter (Signed)
Sent in

## 2018-10-16 ENCOUNTER — Other Ambulatory Visit: Payer: Self-pay | Admitting: Family Medicine

## 2018-10-16 DIAGNOSIS — K219 Gastro-esophageal reflux disease without esophagitis: Secondary | ICD-10-CM

## 2018-10-16 DIAGNOSIS — E785 Hyperlipidemia, unspecified: Secondary | ICD-10-CM

## 2018-10-16 MED ORDER — ATORVASTATIN CALCIUM 40 MG PO TABS: 40.0000 mg | ORAL_TABLET | Freq: Every day | ORAL | 3 refills | Status: DC

## 2018-10-16 MED ORDER — OMEPRAZOLE 40 MG PO CPDR: 40.0000 mg | DELAYED_RELEASE_CAPSULE | Freq: Every day | ORAL | 3 refills | Status: DC

## 2018-10-16 NOTE — Telephone Encounter (Signed)
Pt needing refills on:  atorvastatin (LIPITOR) 40 MG tablet omeprazole (PRILOSEC) 40 MG capsule   Please fill at: [facility], [place] - [place] [phone] ([phone] (Fax)   Thanks, [employer]

## 2018-10-19 ENCOUNTER — Telehealth: Payer: Self-pay | Admitting: Family Medicine

## 2018-10-19 DIAGNOSIS — F99 Mental disorder, not otherwise specified: Principal | ICD-10-CM

## 2018-10-19 DIAGNOSIS — E119 Type 2 diabetes mellitus without complications: Secondary | ICD-10-CM

## 2018-10-19 DIAGNOSIS — F5105 Insomnia due to other mental disorder: Secondary | ICD-10-CM

## 2018-10-19 DIAGNOSIS — I10 Essential (primary) hypertension: Secondary | ICD-10-CM

## 2018-10-19 MED ORDER — TELMISARTAN 40 MG PO TABS: 40.0000 mg | ORAL_TABLET | Freq: Every day | ORAL | 0 refills | Status: DC

## 2018-10-19 MED ORDER — TRAZODONE HCL 150 MG PO TABS: 150.0000 mg | ORAL_TABLET | Freq: Every day | ORAL | 0 refills | Status: DC

## 2018-10-19 NOTE — Telephone Encounter (Signed)
[facility] faxed refill request for the following medications:  traZODone (DESYREL) 150 MG tablet  telmisartan (MICARDIS) 40 MG tablet   microlet next lancing device   Please advise.

## 2018-10-19 NOTE — Telephone Encounter (Addendum)
[facility] faxed refill request for the following medications:  gabapentin (NEURONTIN) 400 MG capsule  cloNIDine (CATAPRES) 0.1 MG tablet  T/S one touch verio strip  atorvastatin (LIPITOR) 40 MG tablet  metFORMIN (GLUCOPHAGE-XR) 500 MG 24 hr tablet   T/S one touch ultra blue    Please advise.

## 2018-10-19 NOTE — Telephone Encounter (Signed)
j

## 2018-10-20 NOTE — Telephone Encounter (Signed)
Please advise? Test strips are not in med list.

## 2018-10-20 NOTE — Telephone Encounter (Signed)
There are 7 other prescriptions on this request that were not responded to.  We received another fax requesting microlet next lancing device again today.  Please advise

## 2018-10-23 MED ORDER — TAMSULOSIN HCL 0.4 MG PO CAPS: 0.4000 mg | ORAL_CAPSULE | Freq: Every day | ORAL | 3 refills | Status: DC

## 2018-10-23 MED ORDER — METFORMIN HCL ER 500 MG PO TB24: 500.0000 mg | ORAL_TABLET | Freq: Two times a day (BID) | ORAL | 1 refills | Status: DC

## 2018-10-23 MED ORDER — MICROLET NEXT LANCING DEVICE MISC: 1.0000 | Freq: Every day | 0 refills | Status: DC

## 2018-10-23 MED ORDER — GLUCOSE BLOOD VI STRP: ORAL_STRIP | 12 refills | Status: DC

## 2018-10-23 NOTE — Addendum Note (Signed)
Addended by: [name] on: 10/23/2018 08:51 AM   Modules accepted: Orders

## 2018-10-24 ENCOUNTER — Encounter: Payer: Self-pay | Admitting: Family Medicine

## 2018-10-24 ENCOUNTER — Other Ambulatory Visit: Payer: Self-pay

## 2018-10-24 ENCOUNTER — Ambulatory Visit (INDEPENDENT_AMBULATORY_CARE_PROVIDER_SITE_OTHER): Payer: Medicare Other | Admitting: Family Medicine

## 2018-10-24 VITALS — BP 136/80 | HR 54 | Temp 98.1°F | Wt 224.8 lb

## 2018-10-24 DIAGNOSIS — E119 Type 2 diabetes mellitus without complications: Secondary | ICD-10-CM | POA: Diagnosis not present

## 2018-10-24 DIAGNOSIS — E1169 Type 2 diabetes mellitus with other specified complication: Secondary | ICD-10-CM | POA: Diagnosis not present

## 2018-10-24 DIAGNOSIS — E785 Hyperlipidemia, unspecified: Secondary | ICD-10-CM | POA: Diagnosis not present

## 2018-10-24 DIAGNOSIS — I10 Essential (primary) hypertension: Secondary | ICD-10-CM

## 2018-10-24 DIAGNOSIS — M792 Neuralgia and neuritis, unspecified: Secondary | ICD-10-CM

## 2018-10-24 LAB — POCT GLYCOSYLATED HEMOGLOBIN (HGB A1C): HEMOGLOBIN A1C: 7 % — AB (ref 4.0–5.6)

## 2018-10-24 MED ORDER — METFORMIN HCL ER 750 MG PO TB24: 750.0000 mg | ORAL_TABLET | Freq: Two times a day (BID) | ORAL | 3 refills | Status: DC

## 2018-10-24 NOTE — Patient Instructions (Addendum)
Schedule an eye exam appointment  Diabetes Mellitus and Nutrition, Adult When you have diabetes (diabetes mellitus), it is very important to have healthy eating habits because your blood sugar (glucose) levels are greatly affected by what you eat and drink. Eating healthy foods in the appropriate amounts, at about the same times every day, can help you:  Control your blood glucose.  Lower your risk of heart disease.  Improve your blood pressure.  Reach or maintain a healthy weight. Every person with diabetes is different, and each person has different needs for a meal plan. Your health care provider may recommend that you work with a diet and nutrition specialist (dietitian) to make a meal plan that is best for you. Your meal plan may vary depending on factors such as:  The calories you need.  The medicines you take.  Your weight.  Your blood glucose, blood pressure, and cholesterol levels.  Your activity level.  Other health conditions you have, such as heart or kidney disease. How do carbohydrates affect me? Carbohydrates, also called carbs, affect your blood glucose level more than any other type of food. Eating carbs naturally raises the amount of glucose in your blood. Carb counting is a method for keeping track of how many carbs you eat. Counting carbs is important to keep your blood glucose at a healthy level, especially if you use insulin or take certain oral diabetes medicines. It is important to know how many carbs you can safely have in each meal. This is different for every person. Your dietitian can help you calculate how many carbs you should have at each meal and for each snack. Foods that contain carbs include:  Bread, cereal, rice, pasta, and crackers.  Potatoes and corn.  Peas, beans, and lentils.  Milk and yogurt.  Fruit and juice.  Desserts, such as cakes, cookies, ice cream, and candy. How does alcohol affect me? Alcohol can cause a sudden decrease in  blood glucose (hypoglycemia), especially if you use insulin or take certain oral diabetes medicines. Hypoglycemia can be a life-threatening condition. Symptoms of hypoglycemia (sleepiness, dizziness, and confusion) are similar to symptoms of having too much alcohol. If your health care provider says that alcohol is safe for you, follow these guidelines:  Limit alcohol intake to no more than 1 drink per day for nonpregnant women and 2 drinks per day for men. One drink equals 12 oz of beer, 5 oz of wine, or 1 oz of hard liquor.  Do not drink on an empty stomach.  Keep yourself hydrated with water, diet soda, or unsweetened iced tea.  Keep in mind that regular soda, juice, and other mixers may contain a lot of sugar and must be counted as carbs. What are tips for following this plan?  Reading food labels  Start by checking the serving size on the "Nutrition Facts" label of packaged foods and drinks. The amount of calories, carbs, fats, and other nutrients listed on the label is based on one serving of the item. Many items contain more than one serving per package.  Check the total grams (g) of carbs in one serving. You can calculate the number of servings of carbs in one serving by dividing the total carbs by 15. For example, if a food has 30 g of total carbs, it would be equal to 2 servings of carbs.  Check the number of grams (g) of saturated and trans fats in one serving. Choose foods that have low or no amount of these  fats.  Check the number of milligrams (mg) of salt (sodium) in one serving. Most people should limit total sodium intake to less than 2,300 mg per day.  Always check the nutrition information of foods labeled as "low-fat" or "nonfat". These foods may be higher in added sugar or refined carbs and should be avoided.  Talk to your dietitian to identify your daily goals for nutrients listed on the label. Shopping  Avoid buying canned, premade, or processed foods. These foods  tend to be high in fat, sodium, and added sugar.  Shop around the outside edge of the grocery store. This includes fresh fruits and vegetables, bulk grains, fresh meats, and fresh dairy. Cooking  Use low-heat cooking methods, such as baking, instead of high-heat cooking methods like deep frying.  Cook using healthy oils, such as olive, canola, or sunflower oil.  Avoid cooking with butter, cream, or high-fat meats. Meal planning  Eat meals and snacks regularly, preferably at the same times every day. Avoid going long periods of time without eating.  Eat foods high in fiber, such as fresh fruits, vegetables, beans, and whole grains. Talk to your dietitian about how many servings of carbs you can eat at each meal.  Eat 4-6 ounces (oz) of lean protein each day, such as lean meat, chicken, fish, eggs, or tofu. One oz of lean protein is equal to: ? 1 oz of meat, chicken, or fish. ? 1 egg. ?  cup of tofu.  Eat some foods each day that contain healthy fats, such as avocado, nuts, seeds, and fish. Lifestyle  Check your blood glucose regularly.  Exercise regularly as told by your health care provider. This may include: ? 150 minutes of moderate-intensity or vigorous-intensity exercise each week. This could be brisk walking, biking, or water aerobics. ? Stretching and doing strength exercises, such as yoga or weightlifting, at least 2 times a week.  Take medicines as told by your health care provider.  Do not use any products that contain nicotine or tobacco, such as cigarettes and e-cigarettes. If you need help quitting, ask your health care provider.  Work with a Social worker or diabetes educator to identify strategies to manage stress and any emotional and social challenges. Questions to ask a health care provider  Do I need to meet with a diabetes educator?  Do I need to meet with a dietitian?  What number can I call if I have questions?  When are the best times to check my blood  glucose? Where to find more information:  American Diabetes Association: diabetes.org  Academy of Nutrition and Dietetics: www.eatright.[employer] of Diabetes and Digestive and Kidney Diseases (NIH): [employer] Summary  A healthy meal plan will help you control your blood glucose and maintain a healthy lifestyle.  Working with a diet and nutrition specialist (dietitian) can help you make a meal plan that is best for you.  Keep in mind that carbohydrates (carbs) and alcohol have immediate effects on your blood glucose levels. It is important to count carbs and to use alcohol carefully. This information is not intended to replace advice given to you by your health care provider. Make sure you discuss any questions you have with your health care provider. Document Released: 07/01/2005 Document Revised: 05/04/2017 Document Reviewed: 11/08/2016 Elsevier Interactive Patient Education  2019 [employer].

## 2018-10-24 NOTE — Progress Notes (Signed)
Patient: Juan Le. Male    DOB: 12/01/1945   73 y.o.   MRN: [id number] Visit Date: 10/24/2018  Today's Provider: [name], MD   Chief Complaint  Patient presents with  . Diabetes  . Hypertension  . Hyperlipidemia   Subjective:    I, Juan Le, CMA, am acting as a Education administrator for [name], MD.   HPI  Diabetes Mellitus Type II, Follow-up:   RecentLabs       Lab Results  Component Value Date   HGBA1C 6.0 (A) 06/12/2018   HGBA1C 5.4 11/29/2017   HGBA1C 6.1 08/30/2017     Last seen for diabetes 4 months ago.  Management since then includes continue current medications. He reports good compliance with treatment. He is not having side effects.  Current symptoms include none  Home blood sugar records: fasting range: 150's  Episodes of hypoglycemia? no              Current Insulin Regimen: none Most Recent Eye Exam: not UTD Weight trend: stable Current diet: diabetic Current exercise: walking  ------------------------------------------------------------------------   Hypertension, follow-up:     BP Readings from Last 3 Encounters:  06/12/18 118/64  06/09/18 112/62  04/12/18 122/78    He was last seen for hypertension 3 months ago.  BP at that visit was 118/64. Management since that visit includes continue medications. He reports good compliance with treatment. He is not having side effects.  He is exercising. He is adherent to low salt diet.   Outside blood pressures are being checked periodically. He is experiencing none.  Patient denies chest pain, chest pressure/discomfort, claudication, dyspnea, exertional chest pressure/discomfort, fatigue, irregular heart beat, lower extremity edema, near-syncope, orthopnea, palpitations, paroxysmal nocturnal dyspnea, syncope and tachypnea.   Cardiovascular risk factors include advanced age (older than 56 for men, 59 for women), diabetes mellitus, dyslipidemia and male  gender.  Use of agents associated with hypertension: none.   ------------------------------------------------------------------------   Lipid/Cholesterol, Follow-up:   Last seen for this 3 months ago.  Management since that visit includes continue medication.  Last Lipid Panel: Lab Results  Component Value Date   CHOL 126 11/29/2017   HDL 32 (L) 11/29/2017   LDLCALC 68 11/29/2017   TRIG 182 (H) 11/29/2017   CHOLHDL 3.9 11/29/2017   He reports good compliance with treatment. He is not having side effects.      Wt Readings from Last 3 Encounters:  06/12/18 219 lb (99.3 kg)  06/09/18 219 lb (99.3 kg)  04/12/18 220 lb 6.4 oz (100 kg)   ------------------------------------------------------------------------  Has been followed by Urology for elevated PSA.  Biopsy was considered as PSA continued to rise.  After treatment for Staph epidermidis UTI, PSA has started downtrending.  No biopsy planned for now.   Allergies  Allergen Reactions  . Ace Inhibitors Cough  . Diphenhydramine     stomach upset     Current Outpatient Medications:  .  aspirin 81 MG tablet, Take 1 tablet by mouth daily., Disp: , Rfl:  .  atorvastatin (LIPITOR) 40 MG tablet, Take 1 tablet (40 mg total) by mouth daily at 6 PM., Disp: 90 tablet, Rfl: 3 .  cloNIDine (CATAPRES) 0.1 MG tablet, Take 1 tablet (0.1 mg total) by mouth 2 (two) times daily., Disp: 60 tablet, Rfl: 5 .  gabapentin (NEURONTIN) 400 MG capsule, Take 2 capsules (800 mg total) by mouth 3 (three) times daily., Disp: 120 capsule, Rfl: 0 .  glucose blood  test strip, Use as instructed, Disp: 50 each, Rfl: 12 .  Lancet Devices (MICROLET NEXT LANCING DEVICE) MISC, 1 each by Does not apply route daily., Disp: 1 each, Rfl: 0 .  metFORMIN (GLUCOPHAGE-XR) 500 MG 24 hr tablet, Take 1 tablet (500 mg total) by mouth 2 (two) times daily., Disp: 180 tablet, Rfl: 1 .  Omega-3 Fatty Acids (FISH OIL) 1000 MG CAPS, Take 2 capsules by mouth daily., Disp:  , Rfl:  .  omeprazole (PRILOSEC) 40 MG capsule, Take 1 capsule (40 mg total) by mouth daily., Disp: 90 capsule, Rfl: 3 .  tamsulosin (FLOMAX) 0.4 MG CAPS capsule, Take 1 capsule (0.4 mg total) by mouth daily., Disp: 90 capsule, Rfl: 3 .  telmisartan (MICARDIS) 40 MG tablet, Take 1 tablet (40 mg total) by mouth daily., Disp: 90 tablet, Rfl: 0 .  traZODone (DESYREL) 150 MG tablet, Take 1 tablet (150 mg total) by mouth at bedtime., Disp: 90 tablet, Rfl: 0 .  umeclidinium-vilanterol (ANORO ELLIPTA) 62.5-25 MCG/INH AEPB, Inhale 1 puff into the lungs daily., Disp: 90 each, Rfl: 3  Review of Systems  Constitutional: Negative.   Respiratory: Negative.   Cardiovascular: Negative.   Endocrine: Negative.   Musculoskeletal: Negative.     Social History   Tobacco Use  . Smoking status: Former Smoker    Types: Cigarettes  . Smokeless tobacco: Never Used  . Tobacco comment: former light smoker; quit in his teens  Substance Use Topics  . Alcohol use: No    Alcohol/week: 0.0 standard drinks      Objective:   BP 136/80 (BP Location: Right Arm, Patient Position: Sitting, Cuff Size: Large)   Pulse (!) 54   Temp 98.1 F (36.7 C) (Oral)   Wt 224 lb 12.8 oz (102 kg)   SpO2 99%   BMI 32.26 kg/m  Vitals:   10/24/18 0850  BP: 136/80  Pulse: (!) 54  Temp: 98.1 F (36.7 C)  TempSrc: Oral  SpO2: 99%  Weight: 224 lb 12.8 oz (102 kg)     Physical Exam Vitals signs reviewed.  Constitutional:      General: He is not in acute distress.    Appearance: Normal appearance. He is well-developed. He is not diaphoretic.  HENT:     Head: Normocephalic and atraumatic.     Right Ear: External ear normal.     Left Ear: External ear normal.     Nose: Nose normal.     Mouth/Throat:     Mouth: Mucous membranes are moist.     Pharynx: Oropharynx is clear. No oropharyngeal exudate.  Eyes:     General: No scleral icterus.    Conjunctiva/sclera: Conjunctivae normal.     Pupils: Pupils are equal, round,  and reactive to light.  Neck:     Musculoskeletal: Neck supple.     Thyroid: No thyromegaly.  Cardiovascular:     Rate and Rhythm: Normal rate and regular rhythm.     Pulses: Normal pulses.     Heart sounds: Normal heart sounds. No murmur.  Pulmonary:     Effort: Pulmonary effort is normal. No respiratory distress.     Breath sounds: Normal breath sounds. No wheezing or rales.  Abdominal:     General: There is no distension.     Palpations: Abdomen is soft.     Tenderness: There is no abdominal tenderness.  Musculoskeletal:        General: No deformity.     Right lower leg: No edema.  Left lower leg: No edema.  Lymphadenopathy:     Cervical: No cervical adenopathy.  Skin:    General: Skin is warm and dry.     Capillary Refill: Capillary refill takes less than 2 seconds.     Findings: No rash.  Neurological:     Mental Status: He is alert and oriented to person, place, and time.  Psychiatric:        Mood and Affect: Mood normal.        Behavior: Behavior normal.        Thought Content: Thought content normal.     Results for orders placed or performed in visit on 10/24/18  POCT HgB A1C  Result Value Ref Range   Hemoglobin A1C 7.0 (A) 4.0 - 5.6 %   HbA1c POC (<> result, manual entry)     HbA1c, POC (prediabetic range)     HbA1c, POC (controlled diabetic range)         Assessment & Plan    Problem List Items Addressed This Visit      Cardiovascular and Mediastinum   Hypertension    Well controlled Continue current meds Reviewed last metabolic panel Recheck at upcoming CPE        Endocrine   Type 2 diabetes mellitus without complications ([place]) - Primary    Previously well controlled, but A1c elevated today Suspect dietary indiscretion over the holidays Increase metformin XR to '750mg'$  BID F/u in 3 months Advised on need for eye exam Advised on low carb diet and exercise UTD on foot exam and vaccines      Relevant Medications   metFORMIN  (GLUCOPHAGE-XR) 750 MG 24 hr tablet   Other Relevant Orders   POCT HgB A1C (Completed)     Other   Dyslipidemia    lst LDL at goal Taking lipitor without side effects - continue Recheck FLP and CMP at upcoming CPE          Return in about 3 months (around 01/23/2019) for diabetes f/u.   The entirety of the information documented in the History of Present Illness, Review of Systems and Physical Exam were personally obtained by me. Portions of this information were initially documented by Juan Le, CMA and reviewed by me for thoroughness and accuracy.    [name], MD, MPH [facility] 10/25/2018 8:24 AM

## 2018-10-25 MED ORDER — GABAPENTIN 400 MG PO CAPS: 800.0000 mg | ORAL_CAPSULE | Freq: Three times a day (TID) | ORAL | 3 refills | Status: DC

## 2018-10-25 MED ORDER — ATORVASTATIN CALCIUM 40 MG PO TABS: 40.0000 mg | ORAL_TABLET | Freq: Every day | ORAL | 3 refills | Status: DC

## 2018-10-25 MED ORDER — GLUCOSE BLOOD VI STRP: ORAL_STRIP | 3 refills | Status: DC

## 2018-10-25 MED ORDER — CLONIDINE HCL 0.1 MG PO TABS: 0.1000 mg | ORAL_TABLET | Freq: Two times a day (BID) | ORAL | 3 refills | Status: DC

## 2018-10-25 NOTE — Assessment & Plan Note (Signed)
Well controlled Continue current meds Reviewed last metabolic panel Recheck at upcoming CPE

## 2018-10-25 NOTE — Assessment & Plan Note (Signed)
Previously well controlled, but A1c elevated today Suspect dietary indiscretion over the holidays Increase metformin XR to 750mg  BID F/u in 3 months Advised on need for eye exam Advised on low carb diet and exercise UTD on foot exam and vaccines

## 2018-10-25 NOTE — Assessment & Plan Note (Signed)
lst LDL at goal Taking lipitor without side effects - continue Recheck FLP and CMP at upcoming CPE

## 2018-10-27 ENCOUNTER — Other Ambulatory Visit: Payer: Self-pay | Admitting: Family Medicine

## 2018-11-01 ENCOUNTER — Telehealth: Payer: Self-pay

## 2018-11-01 DIAGNOSIS — E119 Type 2 diabetes mellitus without complications: Secondary | ICD-10-CM

## 2018-11-01 NOTE — Telephone Encounter (Signed)
Patient needs one touch glucometer sent to [facility]

## 2018-11-01 NOTE — Telephone Encounter (Signed)
Please review. KW 

## 2018-11-02 MED ORDER — BLOOD GLUCOSE MONITOR KIT: PACK | 0 refills | Status: DC

## 2018-11-02 NOTE — Addendum Note (Signed)
Addended by: [name] on: 11/02/2018 11:17 AM   Modules accepted: Orders

## 2018-11-10 ENCOUNTER — Telehealth: Payer: Self-pay

## 2018-11-10 DIAGNOSIS — E119 Type 2 diabetes mellitus without complications: Secondary | ICD-10-CM

## 2018-11-10 MED ORDER — BLOOD GLUCOSE MONITOR KIT: PACK | 0 refills | Status: DC

## 2018-11-10 NOTE — Telephone Encounter (Signed)
Candice from [facility] had called because they had received prescription for glucometer for mail order. Candice states that they do not send this mail order and is requesting that physcian cancel order and send to patients local pharmacy.  Candice call back number is [phone] Ref# [id number]

## 2018-11-13 ENCOUNTER — Telehealth: Payer: Self-pay | Admitting: Family Medicine

## 2018-11-13 NOTE — Telephone Encounter (Signed)
Raney w/ [facility] [phone]   Ref # [id number]  One touch lancets and test strips. Has a question regarding Rx directions for up to 4 times a day  Pt testing 2 times a day.   Make sure the Rx directions are correct.  Please advise.  Thanks, [employer]

## 2018-11-13 NOTE — Telephone Encounter (Signed)
Pharmacist with [facility] advised.

## 2018-11-13 NOTE — Telephone Encounter (Signed)
As patient is not on insulin, he is only eligible for insurance to cover testing once daily.  He does not need to test more than this as far as I'm concerned also

## 2018-11-14 ENCOUNTER — Telehealth: Payer: Self-pay

## 2018-11-14 MED ORDER — ONETOUCH DELICA LANCETS 33G MISC: 3 refills | Status: DC

## 2018-11-14 NOTE — Telephone Encounter (Signed)
Patient calling that he needs Dr. B to okay the lancets for his 1 touch delica. Reports that someone from [facility] got in touch about it yesterday with the office.He said they are just waiting for the Dr. Juanda Crumble this.

## 2018-11-14 NOTE — Telephone Encounter (Signed)
I have not seen anything come through from his pharmacy to approve this, but I did send a prescription for it

## 2018-11-14 NOTE — Telephone Encounter (Signed)
Patient advised as below.  

## 2018-11-20 ENCOUNTER — Telehealth: Payer: Self-pay | Admitting: Family Medicine

## 2018-11-20 NOTE — Telephone Encounter (Signed)
Attempted to contact patient, no answer and voicemail is full. Contacted [facility] they are working on Research scientist (medical) for lancets. She states it is to early to fill the test strips. Patient should only be testing once daily. So insurance will not pay for RX until March.

## 2018-11-20 NOTE — Telephone Encounter (Signed)
Pt called stating he needs an order for new lancets (one touch delta plus 33 gage)  Order for twice a day  [facility] is his pharm.  He said they have requested this order but have not heard anything.  Call back is [phone]  Thanks Con Memos

## 2018-11-21 DIAGNOSIS — E119 Type 2 diabetes mellitus without complications: Secondary | ICD-10-CM | POA: Diagnosis not present

## 2018-11-21 DIAGNOSIS — H2513 Age-related nuclear cataract, bilateral: Secondary | ICD-10-CM | POA: Diagnosis not present

## 2018-11-21 LAB — HM DIABETES EYE EXAM

## 2018-12-13 ENCOUNTER — Ambulatory Visit (INDEPENDENT_AMBULATORY_CARE_PROVIDER_SITE_OTHER): Payer: Medicare Other

## 2018-12-13 ENCOUNTER — Ambulatory Visit (INDEPENDENT_AMBULATORY_CARE_PROVIDER_SITE_OTHER): Payer: Medicare Other | Admitting: Family Medicine

## 2018-12-13 ENCOUNTER — Encounter: Payer: Self-pay | Admitting: Family Medicine

## 2018-12-13 VITALS — BP 122/56 | HR 57 | Temp 99.1°F | Ht 70.0 in | Wt 223.8 lb

## 2018-12-13 DIAGNOSIS — Z6831 Body mass index (BMI) 31.0-31.9, adult: Secondary | ICD-10-CM

## 2018-12-13 DIAGNOSIS — E785 Hyperlipidemia, unspecified: Secondary | ICD-10-CM | POA: Diagnosis not present

## 2018-12-13 DIAGNOSIS — E1165 Type 2 diabetes mellitus with hyperglycemia: Secondary | ICD-10-CM

## 2018-12-13 DIAGNOSIS — Z0000 Encounter for general adult medical examination without abnormal findings: Secondary | ICD-10-CM | POA: Diagnosis not present

## 2018-12-13 DIAGNOSIS — G4733 Obstructive sleep apnea (adult) (pediatric): Secondary | ICD-10-CM | POA: Diagnosis not present

## 2018-12-13 DIAGNOSIS — E669 Obesity, unspecified: Secondary | ICD-10-CM

## 2018-12-13 DIAGNOSIS — G4701 Insomnia due to medical condition: Secondary | ICD-10-CM

## 2018-12-13 DIAGNOSIS — I10 Essential (primary) hypertension: Secondary | ICD-10-CM

## 2018-12-13 MED ORDER — GLUCOSE BLOOD VI STRP: ORAL_STRIP | 3 refills | Status: DC

## 2018-12-13 NOTE — Patient Instructions (Addendum)
[name] , Thank you for taking time to come for your Medicare Wellness Visit. I appreciate your ongoing commitment to your health goals. Please review the following plan we discussed and let me know if I can assist you in the future.   Screening recommendations/referrals: Colonoscopy: Up to date, due 01/2022 Recommended yearly ophthalmology/optometry visit for glaucoma screening and checkup Recommended yearly dental visit for hygiene and checkup  Vaccinations: Influenza vaccine: Up to date Pneumococcal vaccine: Completed series Tdap vaccine: Up to date, due 05/2024 Shingles vaccine: Pt declines today.     Advanced directives: Please bring a copy of your POA (Power of Attorney) and/or Living Will to your next appointment.   Conditions/risks identified: Obesity- recommend to cut back on soda intake to 1 glass a day and substitute with water.   Next appointment: 10:00 AM with Dr Juan Le  Preventive Care 27 Years and Older, Male Preventive care refers to lifestyle choices and visits with your health care provider that can promote health and wellness. What does preventive care include?  A yearly physical exam. This is also called an annual well check.  Dental exams once or twice a year.  Routine eye exams. Ask your health care provider how often you should have your eyes checked.  Personal lifestyle choices, including:  Daily care of your teeth and gums.  Regular physical activity.  Eating a healthy diet.  Avoiding tobacco and drug use.  Limiting alcohol use.  Practicing safe sex.  Taking low doses of aspirin every day.  Taking vitamin and mineral supplements as recommended by your health care provider. What happens during an annual well check? The services and screenings done by your health care provider during your annual well check will depend on your age, overall health, lifestyle risk factors, and family history of disease. Counseling  Your health care  provider may ask you questions about your:  Alcohol use.  Tobacco use.  Drug use.  Emotional well-being.  Home and relationship well-being.  Sexual activity.  Eating habits.  History of falls.  Memory and ability to understand (cognition).  Work and work Statistician. Screening  You may have the following tests or measurements:  Height, weight, and BMI.  Blood pressure.  Lipid and cholesterol levels. These may be checked every 5 years, or more frequently if you are over 35 years old.  Skin check.  Lung cancer screening. You may have this screening every year starting at age 59 if you have a 30-pack-year history of smoking and currently smoke or have quit within the past 15 years.  Fecal occult blood test (FOBT) of the stool. You may have this test every year starting at age 32.  Flexible sigmoidoscopy or colonoscopy. You may have a sigmoidoscopy every 5 years or a colonoscopy every 10 years starting at age 37.  Prostate cancer screening. Recommendations will vary depending on your family history and other risks.  Hepatitis C blood test.  Hepatitis B blood test.  Sexually transmitted disease (STD) testing.  Diabetes screening. This is done by checking your blood sugar (glucose) after you have not eaten for a while (fasting). You may have this done every 1-3 years.  Abdominal aortic aneurysm (AAA) screening. You may need this if you are a current or former smoker.  Osteoporosis. You may be screened starting at age 37 if you are at high risk. Talk with your health care provider about your test results, treatment options, and if necessary, the need for more tests. Vaccines  Your health  care provider may recommend certain vaccines, such as:  Influenza vaccine. This is recommended every year.  Tetanus, diphtheria, and acellular pertussis (Tdap, Td) vaccine. You may need a Td booster every 10 years.  Zoster vaccine. You may need this after age 66.  Pneumococcal  13-valent conjugate (PCV13) vaccine. One dose is recommended after age 28.  Pneumococcal polysaccharide (PPSV23) vaccine. One dose is recommended after age 60. Talk to your health care provider about which screenings and vaccines you need and how often you need them. This information is not intended to replace advice given to you by your health care provider. Make sure you discuss any questions you have with your health care provider. Document Released: 10/31/2015 Document Revised: 06/23/2016 Document Reviewed: 08/05/2015 Elsevier Interactive Patient Education  2017 [place] [employer] Prevention in the Home Falls can cause injuries. They can happen to people of all ages. There are many things you can do to make your home safe and to help prevent falls. What can I do on the outside of my home?  Regularly fix the edges of walkways and driveways and fix any cracks.  Remove anything that might make you trip as you walk through a door, such as a raised step or threshold.  Trim any bushes or trees on the path to your home.  Use bright outdoor lighting.  Clear any walking paths of anything that might make someone trip, such as rocks or tools.  Regularly check to see if handrails are loose or broken. Make sure that both sides of any steps have handrails.  Any raised decks and porches should have guardrails on the edges.  Have any leaves, snow, or ice cleared regularly.  Use sand or salt on walking paths during winter.  Clean up any spills in your garage right away. This includes oil or grease spills. What can I do in the bathroom?  Use night lights.  Install grab bars by the toilet and in the tub and shower. Do not use towel bars as grab bars.  Use non-skid mats or decals in the tub or shower.  If you need to sit down in the shower, use a plastic, non-slip stool.  Keep the floor dry. Clean up any water that spills on the floor as soon as it happens.  Remove soap buildup in the  tub or shower regularly.  Attach bath mats securely with double-sided non-slip rug tape.  Do not have throw rugs and other things on the floor that can make you trip. What can I do in the bedroom?  Use night lights.  Make sure that you have a light by your bed that is easy to reach.  Do not use any sheets or blankets that are too big for your bed. They should not hang down onto the floor.  Have a firm chair that has side arms. You can use this for support while you get dressed.  Do not have throw rugs and other things on the floor that can make you trip. What can I do in the kitchen?  Clean up any spills right away.  Avoid walking on wet floors.  Keep items that you use a lot in easy-to-reach places.  If you need to reach something above you, use a strong step stool that has a grab bar.  Keep electrical cords out of the way.  Do not use floor polish or wax that makes floors slippery. If you must use wax, use non-skid floor wax.  Do not have  throw rugs and other things on the floor that can make you trip. What can I do with my stairs?  Do not leave any items on the stairs.  Make sure that there are handrails on both sides of the stairs and use them. Fix handrails that are broken or loose. Make sure that handrails are as long as the stairways.  Check any carpeting to make sure that it is firmly attached to the stairs. Fix any carpet that is loose or worn.  Avoid having throw rugs at the top or bottom of the stairs. If you do have throw rugs, attach them to the floor with carpet tape.  Make sure that you have a light switch at the top of the stairs and the bottom of the stairs. If you do not have them, ask someone to add them for you. What else can I do to help prevent falls?  Wear shoes that:  Do not have high heels.  Have rubber bottoms.  Are comfortable and fit you well.  Are closed at the toe. Do not wear sandals.  If you use a stepladder:  Make sure that it  is fully opened. Do not climb a closed stepladder.  Make sure that both sides of the stepladder are locked into place.  Ask someone to hold it for you, if possible.  Clearly mark and make sure that you can see:  Any grab bars or handrails.  First and last steps.  Where the edge of each step is.  Use tools that help you move around (mobility aids) if they are needed. These include:  Canes.  Walkers.  Scooters.  Crutches.  Turn on the lights when you go into a dark area. Replace any light bulbs as soon as they burn out.  Set up your furniture so you have a clear path. Avoid moving your furniture around.  If any of your floors are uneven, fix them.  If there are any pets around you, be aware of where they are.  Review your medicines with your doctor. Some medicines can make you feel dizzy. This can increase your chance of falling. Ask your doctor what other things that you can do to help prevent falls. This information is not intended to replace advice given to you by your health care provider. Make sure you discuss any questions you have with your health care provider. Document Released: 07/31/2009 Document Revised: 03/11/2016 Document Reviewed: 11/08/2014 Elsevier Interactive Patient Education  2017 [employer].

## 2018-12-13 NOTE — Patient Instructions (Signed)
Preventive Care 2 Years and Older, Male Preventive care refers to lifestyle choices and visits with your health care provider that can promote health and wellness. What does preventive care include?   A yearly physical exam. This is also called an annual well check.  Dental exams once or twice a year.  Routine eye exams. Ask your health care provider how often you should have your eyes checked.  Personal lifestyle choices, including: ? Daily care of your teeth and gums. ? Regular physical activity. ? Eating a healthy diet. ? Avoiding tobacco and drug use. ? Limiting alcohol use. ? Practicing safe sex. ? Taking low doses of aspirin every day. ? Taking vitamin and mineral supplements as recommended by your health care provider. What happens during an annual well check? The services and screenings done by your health care provider during your annual well check will depend on your age, overall health, lifestyle risk factors, and family history of disease. Counseling Your health care provider may ask you questions about your:  Alcohol use.  Tobacco use.  Drug use.  Emotional well-being.  Home and relationship well-being.  Sexual activity.  Eating habits.  History of falls.  Memory and ability to understand (cognition).  Work and work Statistician. Screening You may have the following tests or measurements:  Height, weight, and BMI.  Blood pressure.  Lipid and cholesterol levels. These may be checked every 5 years, or more frequently if you are over 9 years old.  Skin check.  Lung cancer screening. You may have this screening every year starting at age 57 if you have a 30-pack-year history of smoking and currently smoke or have quit within the past 15 years.  Colorectal cancer screening. All adults should have this screening starting at age 90 and continuing until age 69. You will have tests every 1-10 years, depending on your results and the type of screening  test. People at increased risk should start screening at an earlier age. Screening tests may include: ? Guaiac-based fecal occult blood testing. ? Fecal immunochemical test (FIT). ? Stool DNA test. ? Virtual colonoscopy. ? Sigmoidoscopy. During this test, a flexible tube with a tiny camera (sigmoidoscope) is used to examine your rectum and lower colon. The sigmoidoscope is inserted through your anus into your rectum and lower colon. ? Colonoscopy. During this test, a long, thin, flexible tube with a tiny camera (colonoscope) is used to examine your entire colon and rectum.  Prostate cancer screening. Recommendations will vary depending on your family history and other risks.  Hepatitis C blood test.  Hepatitis B blood test.  Sexually transmitted disease (STD) testing.  Diabetes screening. This is done by checking your blood sugar (glucose) after you have not eaten for a while (fasting). You may have this done every 1-3 years.  Abdominal aortic aneurysm (AAA) screening. You may need this if you are a current or former smoker.  Osteoporosis. You may be screened starting at age 30 if you are at high risk. Talk with your health care provider about your test results, treatment options, and if necessary, the need for more tests. Vaccines Your health care provider may recommend certain vaccines, such as:  Influenza vaccine. This is recommended every year.  Tetanus, diphtheria, and acellular pertussis (Tdap, Td) vaccine. You may need a Td booster every 10 years.  Varicella vaccine. You may need this if you have not been vaccinated.  Zoster vaccine. You may need this after age 42.  Measles, mumps, and rubella (MMR) vaccine.  You may need at least one dose of MMR if you were born in 1957 or later. You may also need a second dose.  Pneumococcal 13-valent conjugate (PCV13) vaccine. One dose is recommended after age 65.  Pneumococcal polysaccharide (PPSV23) vaccine. One dose is recommended  after age 65.  Meningococcal vaccine. You may need this if you have certain conditions.  Hepatitis A vaccine. You may need this if you have certain conditions or if you travel or work in places where you may be exposed to hepatitis A.  Hepatitis B vaccine. You may need this if you have certain conditions or if you travel or work in places where you may be exposed to hepatitis B.  Haemophilus influenzae type b (Hib) vaccine. You may need this if you have certain risk factors. Talk to your health care provider about which screenings and vaccines you need and how often you need them. This information is not intended to replace advice given to you by your health care provider. Make sure you discuss any questions you have with your health care provider. Document Released: 10/31/2015 Document Revised: 11/24/2017 Document Reviewed: 08/05/2015 Elsevier Interactive Patient Education  2019 [employer]  

## 2018-12-13 NOTE — Progress Notes (Signed)
Subjective:   [name] is a 73 y.o. male who presents for Medicare Annual/Subsequent preventive examination.  Review of Systems:  N/A  Cardiac Risk Factors include: advanced age (>41mn, >>36women);male gender;hypertension;obesity (BMI >30kg/m2);diabetes mellitus;dyslipidemia     Objective:    Vitals: BP (!) 122/56 (BP Location: Right Arm)   Pulse (!) 57   Temp 99.1 F (37.3 C) (Oral)   Ht '5\' 10"'$  (1.778 m)   Wt 223 lb 12.8 oz (101.5 kg)   BMI 32.11 kg/m   Body mass index is 32.11 kg/m.  Advanced Directives 12/13/2018 01/24/2018 08/11/2017 06/16/2017 05/20/2017 03/24/2017 03/22/2017  Does Patient Have a Medical Advance Directive? Yes Yes Yes Yes Yes Yes Yes  Type of AParamedicof AHoldregeLiving will Living will;Healthcare Power of [place]Whe[place]Living will HMountain CityLiving will HFultonvilleLiving will HMun[place]Living will HHannawa FallsLiving will  Does patient want to make changes to medical advance directive? - - - - - - -  Copy of HMount G[place]adin Chart? No - copy requested No - copy requested - - - - -  Would patient like information on creating a medical advance directive? - - - - - - -    Tobacco Social History   Tobacco Use  Smoking Status Former Smoker  . Types: Cigarettes  Smokeless Tobacco Never Used  Tobacco Comment   former light smoker; quit in his teens     Counseling given: Not Answered Comment: former light smoker; quit in his teens   Clinical Intake:  Pre-visit preparation completed: Yes  Pain : No/denies pain Pain Score: 0-No pain    Diabetes:  Is the patient diabetic?  Yes type 2 If diabetic, was a CBG obtained today?  No  Did the patient bring in their glucometer from home?  No  How often do you monitor your CBG's? Twice a day.   Financial Strains and Diabetes Management:  Are you having any  financial strains with the device, your supplies or your medication? No .  Does the patient want to be seen by Chronic Care Management for management of their diabetes?  No  Would the patient like to be referred to a Nutritionist or for Diabetic Management?  No   Diabetic Exams:  Diabetic Eye Exam: Completed 10/2018 per pt. Requested records to be sent to office.   Diabetic Foot Exam: Completed 03/10/18.    Nutritional Status: BMI > 30  Obese Nutritional Risks: None   How often do you need to have someone help you when you read instructions, pamphlets, or other written materials from your doctor or pharmacy?: 1 - Never  Interpreter Needed?: No  Information entered by :: [facility] LPN  Past Medical History:  Diagnosis Date  . Anxiety   . Atelectasis of left lung 03/25/2017  . Chronic, continuous use of opioids   . COPD (chronic obstructive pulmonary disease) ([place]   . Degenerative disc disease, lumbar   . Diabetes ([place]   . DJD (degenerative joint disease)   . GERD (gastroesophageal reflux disease)   . Heart attack ([place]   . History of diverticulitis   . Hypertension   . Hypertension   . Hypertriglyceridemia   . Hypokalemia   . Insomnia   . Mitral regurgitation   . Sleep apnea   . Vitamin D deficiency   . Wears dentures    full upper   Past Surgical History:  Procedure Laterality Date  . COLONOSCOPY WITH PROPOFOL N/A 01/28/2017   Procedure: COLONOSCOPY WITH PROPOFOL;  Surgeon: [name], MD;  Location: [facility] [facility];  Service: Endoscopy;  Laterality: N/A;  diabetic - oral meds  . CORONARY ARTERY BYPASS GRAFT  1997  . CORONARY STENT PLACEMENT  11/2006  . FOOT SURGERY  2003  . HERNIA REPAIR    . POLYPECTOMY N/A 01/28/2017   Procedure: POLYPECTOMY;  Surgeon: [name], MD;  Location: [place];  Service: Endoscopy;  Laterality: N/A;  . UMBILICAL HERNIA REPAIR  04/2012   Family History  Problem Relation Age of Onset  . Throat cancer Father   .  Colon cancer Father   . Heart disease Father   . Cancer Father   . Depression Father   . Hypertension Father   . Parkinson's disease Father   . Arthritis Brother   . Stroke Mother   . Kidney disease Mother   . Heart disease Mother   . Kidney failure Mother   . Breast cancer Paternal Aunt   . Depression Sister   . Stroke Maternal Grandmother   . Bone cancer Brother   . Stroke Brother   . Prostate cancer Neg Hx   . Kidney cancer Neg Hx   . Bladder Cancer Neg Hx    Social History   Socioeconomic History  . Marital status: Married    Spouse name: Not on file  . Number of children: 2  . Years of education: 11  . Highest education level: High school graduate  Occupational History  . 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 Financial resource strain: Not hard at all  . Food insecurity:    Worry: Never true    Inability: Never true  . Transportation needs:    Medical: No    Non-medical: No  Tobacco Use  . Smoking status: Former Smoker    Types: Cigarettes  . Smokeless tobacco: Never Used  . Tobacco comment: former light smoker; quit in his teens  Substance and Sexual Activity  . Alcohol use: No    Alcohol/week: 0.0 standard drinks  . Drug use: No  . Sexual activity: Yes    Partners: Female  Lifestyle  . Physical activity:    Days per week: 3 days    Minutes per session: 10 min  . Stress: Not at all  Relationships  . Social connections:    Talks on phone: More than three times a week    Gets together: Once a week    Attends religious service: Never    Active member of club or organization: No    Attends meetings of clubs or organizations: Never    Relationship status: Married  Other Topics Concern  . Not on file  Social History Narrative  . Not on file    Outpatient Encounter Medications as of 12/13/2018  Medication Sig  . aspirin 81 MG tablet Take 1 tablet by mouth daily.  Marland Kitchen atorvastatin (LIPITOR) 40  MG tablet Take 1 tablet (40 mg total) by mouth daily at 6 PM.  . blood glucose meter kit and supplies KIT Dispense based on patient and insurance preference. Use up to four times daily as directed. (FOR ICD-9 250.00, 250.01).  . cloNIDine (CATAPRES) 0.1 MG tablet Take 1 tablet (0.1 mg total) by mouth 2 (two) times daily.  Marland Kitchen gabapentin (NEURONTIN) 400 MG capsule Take 2 capsules (800 mg total) by mouth 3 (  three) times daily.  Marland Kitchen glucose blood test strip Use as instructed  . metFORMIN (GLUCOPHAGE-XR) 750 MG 24 hr tablet Take 1 tablet (750 mg total) by mouth 2 (two) times daily.  . Omega-3 Fatty Acids (FISH OIL) 1000 MG CAPS Take 2 capsules by mouth daily.  Marland Kitchen omeprazole (PRILOSEC) 40 MG capsule Take 1 capsule (40 mg total) by mouth daily.  Glory Rosebush DELICA LANCETS 78L MISC Use as directed to check blood glucose once daily  . telmisartan (MICARDIS) 40 MG tablet Take 1 tablet (40 mg total) by mouth daily.  . traZODone (DESYREL) 150 MG tablet Take 1 tablet (150 mg total) by mouth at bedtime.  Marland Kitchen umeclidinium-vilanterol (ANORO ELLIPTA) 62.5-25 MCG/INH AEPB Inhale 1 puff into the lungs daily.  Elmore Guise Devices (MICROLET NEXT LANCING DEVICE) MISC 1 each by Does not apply route daily. (Patient not taking: Reported on 12/13/2018)  . tamsulosin (FLOMAX) 0.4 MG CAPS capsule Take 1 capsule (0.4 mg total) by mouth daily. (Patient not taking: Reported on 12/13/2018)   No facility-administered encounter medications on file as of 12/13/2018.     Activities of Daily Living In your present state of health, do you have any difficulty performing the following activities: 12/13/2018 01/24/2018  Hearing? N N  Vision? N N  Difficulty concentrating or making decisions? N Y  Walking or climbing stairs? N N  Dressing or bathing? N N  Doing errands, shopping? N N  Preparing Food and eating ? N N  Using the Toilet? N N  In the past six months, have you accidently leaked urine? Y N  Comment Occasional leakage. -  Do you have  problems with loss of bowel control? N N  Managing your Medications? N N  Managing your Finances? N N  Housekeeping or managing your Housekeeping? N N  Some recent data might be hidden    Patient Care Team: [name], MD as PCP - General (Family Medicine) Anell Barr, OD as Consulting Physician (Optometry) Dionisio David, MD as Consulting Physician (Cardiology) Laneta Simmers as Physician Assistant (Urology) Laverle Hobby, MD as Consulting Physician (Pulmonary Disease)   Assessment:   This is a routine wellness examination for [employer].  Exercise Activities and Dietary recommendations Current Exercise Habits: Home exercise routine, Type of exercise: treadmill;walking, Time (Minutes): 15, Frequency (Times/Week): 3, Weekly Exercise (Minutes/Week): 45, Intensity: Mild  Goals    . DIET - DECREASE SODA OR JUICE INTAKE     Recommend to cut back on soda intake to 1 glass a day and substitute with water.     Marland Kitchen DIET - INCREASE WATER INTAKE     Recommend drinking at least 6-8 glasses of water a day     . Increase water intake     Recommend increasing water intake to at least 3 glasses a day.       Fall Risk Fall Risk  12/13/2018 01/24/2018 08/30/2017 08/11/2017 06/16/2017  Falls in the past year? 0 No No No No   FALL RISK PREVENTION PERTAINING TO THE HOME: Any stairs in or around the home? No  If so, do they handrails? N/A  Home free of loose throw rugs in walkways, pet beds, electrical cords, etc? Yes  Adequate lighting in your home to reduce risk of falls? Yes   ASSISTIVE DEVICES UTILIZED TO PREVENT FALLS:  Life alert? No  Use of a cane, walker or w/c? No  Grab bars in the bathroom? Yes  Shower chair or bench in shower?  No  Elevated toilet seat or a handicapped toilet? Yes    TIMED UP AND GO:  Was the test performed? No .    Depression Screen PHQ 2/9 Scores 12/13/2018 01/24/2018 10/03/2017 10/03/2017  PHQ - 2 Score 0 0 4 0  PHQ- 9 Score - -  13 -    Cognitive Function     6CIT Screen 12/13/2018 01/24/2018 01/17/2017  What Year? 0 points 0 points 0 points  What month? 0 points 0 points 0 points  What time? 0 points 0 points 0 points  Count back from 20 0 points 0 points 0 points  Months in reverse 0 points 0 points 4 points  Repeat phrase 0 points 0 points 2 points  Total Score 0 0 6    Immunization History  Administered Date(s) Administered  . Influenza, High Dose Seasonal PF 07/17/2015, 07/29/2016, 07/15/2017  . Influenza, Seasonal, Injecte, Preservative Fre 08/14/2008  . Influenza,inj,Quad PF,6+ Mos 07/20/2013, 06/20/2014  . Influenza,inj,quad, With Preservative 08/18/2018  . Influenza-Unspecified 08/18/2018  . Pneumococcal Conjugate-13 08/15/2014  . Pneumococcal Polysaccharide-23 05/11/2012  . Tdap 05/29/2014    Qualifies for Shingles Vaccine? Yes . Due for Shingrix. Education has been provided regarding the importance of this vaccine. Pt has been advised to call insurance company to determine out of pocket expense. Advised may also receive vaccine at local pharmacy or Health Dept. Verbalized acceptance and understanding.  Tdap: Up to date  Flu Vaccine: Up to date  Pneumococcal Vaccine: Up to date  Screening Tests Health Maintenance  Topic Date Due  . OPHTHALMOLOGY EXAM  02/21/1956  . FOOT EXAM  03/11/2019  . HEMOGLOBIN A1C  04/24/2019  . COLONOSCOPY  01/28/2022  . TETANUS/TDAP  05/29/2024  . INFLUENZA VACCINE  Completed  . Hepatitis C Screening  Completed  . PNA vac Low Risk Adult  Completed   Cancer Screenings:  Colorectal Screening: Completed 01/28/17. Repeat every 5 years.  Lung Cancer Screening: (Low Dose CT Chest recommended if Age 75-80 years, 30 pack-year currently smoking OR have quit w/in 15years.) does not qualify.    Additional Screening:  Hepatitis C Screening: Up to date  Vision Screening: Recommended annual ophthalmology exams for early detection of glaucoma and other disorders of  the eye.  Dental Screening: Recommended annual dental exams for proper oral hygiene  Community Resource Referral:  CRR required this visit?  No        Plan:  I have personally reviewed and addressed the Medicare Annual Wellness questionnaire and have noted the following in the patient's chart:  A. Medical and social history B. Use of alcohol, tobacco or illicit drugs  C. Current medications and supplements D. Functional ability and status E.  Nutritional status F.  Physical activity G. Advance directives H. List of other physicians I.  Hospitalizations, surgeries, and ER visits in previous 12 months J.  [place] such as hearing and vision if needed, cognitive and depression L. Referrals and appointments - none  In addition, I have reviewed and discussed with patient certain preventive protocols, quality metrics, and best practice recommendations. A written personalized care plan for preventive services as well as general preventive health recommendations were provided to patient.  See attached scanned questionnaire for additional information.   Signed,  Fabio Neighbors, LPN Nurse Health Advisor   Nurse Recommendations: None. Will contact Dr Waynetta Sandy office to retrieve eye exam notes from this year.

## 2018-12-13 NOTE — Progress Notes (Signed)
Patient: Juan Le., Male    DOB: [birth year], 73 y.o.   MRN: [id number] Visit Date: 12/13/2018  Today's Provider: [name], MD   Chief Complaint  Patient presents with  . Annual Exam   Subjective:    I, Juan Le, CMA, am acting as a scribe for [name], MD.   Patient had a AWE today prior to appointment  Complete Physical Juan Le. is a 73 y.o. male. He feels well. He reports exercising 3 days per week. He reports he is sleeping poorly.  Patient continues to be concerned about his nonrestorative sleep.  He states he wakes up in the middle of the night for unknown reasons and then is unable to fall back to sleep.  His wife complains often about his snoring.  He has nocturia x1.  He was diagnosed in 2018 with obstructive sleep apnea after having an AHI of 23.8 events per hour on polysomnography.  Patient has never obtained a CPAP machine.  He is taking trazodone at bedtime which helps him fall asleep somewhat. -----------------------------------------------------------   Review of Systems  Constitutional: Negative.   HENT: Negative.   Eyes: Negative.   Respiratory: Negative.   Cardiovascular: Negative.   Gastrointestinal: Negative.   Endocrine: Negative.   Genitourinary: Negative.   Musculoskeletal: Negative.   Skin: Negative.   Allergic/Immunologic: Negative.   Neurological: Negative.   Hematological: Negative.   Psychiatric/Behavioral: Positive for sleep disturbance.    Social History   Socioeconomic History  . Marital status: Married    Spouse name: Not on file  . Number of children: 2  . Years of education: 13  . Highest education level: High school graduate  Occupational History  . 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 Financial resource strain: Not hard at all  . Food insecurity:    Worry: Never true    Inability: Never true  . Transportation needs:   Medical: No    Non-medical: No  Tobacco Use  . Smoking status: Former Smoker    Types: Cigarettes  . Smokeless tobacco: Never Used  . Tobacco comment: former light smoker; quit in his teens  Substance and Sexual Activity  . Alcohol use: No    Alcohol/week: 0.0 standard drinks  . Drug use: No  . Sexual activity: Yes    Partners: Female  Lifestyle  . Physical activity:    Days per week: 3 days    Minutes per session: 10 min  . Stress: Not at all  Relationships  . Social connections:    Talks on phone: More than three times a week    Gets together: Once a week    Attends religious service: Never    Active member of club or organization: No    Attends meetings of clubs or organizations: Never    Relationship status: Married  . Intimate partner violence:    Fear of current or ex partner: Patient refused    Emotionally abused: Patient refused    Physically abused: Patient refused    Forced sexual activity: Patient refused  Other Topics Concern  . Not on file  Social History Narrative  . Not on file    Past Medical History:  Diagnosis Date  . Anxiety   . Atelectasis of left lung 03/25/2017  . Chronic, continuous use of opioids   . COPD (chronic obstructive pulmonary disease) ([place])   . Degenerative disc disease,  lumbar   . Diabetes (HCC)   . DJD (degenerative joint disease)   . GERD (gastroesophageal reflux disease)   . Heart attack (HCC)   . History of diverticulitis   . Hypertension   . Hypertension   . Hypertriglyceridemia   . Hypokalemia   . Insomnia   . Mitral regurgitation   . Sleep apnea   . Vitamin D deficiency   . Wears dentures    full upper     Patient Active Problem List   Diagnosis Date Noted  . Class 1 obesity with serious comorbidity and body mass index (BMI) of 31.0 to 31.9 in adult 06/12/2018  . History of acute myocardial infarction 03/10/2018  . Obstructive sleep apnea 03/10/2018  . Hx of colonic polyps   . Benign neoplasm of ascending colon    . Benign neoplasm of descending colon   . Atherosclerosis of coronary artery 01/20/2017  . DDD (degenerative disc disease), lumbar 01/20/2017  . Dermatitis, eczematoid 01/20/2017  . Cervical spinal stenosis 01/20/2017  . MI (mitral incompetence) 01/20/2017  . Osteoarthritis of right foot 04/16/2016  . Chronic neck and back pain 08/13/2015  . GERD (gastroesophageal reflux disease) 06/12/2015  . Hypertension 06/12/2015  . Numbness in feet 06/12/2015  . Nocturnal leg cramps 05/12/2015  . Type 2 diabetes mellitus without complications (HCC) 04/16/2015  . Dyslipidemia 04/16/2015  . Insomnia 04/16/2015  . H/O coronary artery bypass surgery 04/16/2015  . Anxiety 04/11/2015  . DDD (degenerative disc disease), cervical 04/11/2015  . H/O adenomatous polyp of colon 06/10/2011    Past Surgical History:  Procedure Laterality Date  . COLONOSCOPY WITH PROPOFOL N/A 01/28/2017   Procedure: COLONOSCOPY WITH PROPOFOL;  Surgeon: Darren Wohl, MD;  Location: [facility];  Service: Endoscopy;  Laterality: N/A;  diabetic - oral meds  . CORONARY ARTERY BYPASS GRAFT  1997  . CORONARY STENT PLACEMENT  11/2006  . FOOT SURGERY  2003  . HERNIA REPAIR    . POLYPECTOMY N/A 01/28/2017   Procedure: POLYPECTOMY;  Surgeon: Darren Wohl, MD;  Location: [facility];  Service: Endoscopy;  Laterality: N/A;  . UMBILICAL HERNIA REPAIR  04/2012    His family history includes Arthritis in his brother; Bone cancer in his brother; Breast cancer in his paternal aunt; Cancer in his father; Colon cancer in his father; Depression in his father and sister; Heart disease in his father and mother; Hypertension in his father; Kidney disease in his mother; Kidney failure in his mother; Parkinson's disease in his father; Stroke in his brother, maternal grandmother, and mother; Throat cancer in his father. There is no history of Prostate cancer, Kidney cancer, or Bladder Cancer.   Current Outpatient Medications:  .   aspirin 81 MG tablet, Take 1 tablet by mouth daily., Disp: , Rfl:  .  atorvastatin (LIPITOR) 40 MG tablet, Take 1 tablet (40 mg total) by mouth daily at 6 PM., Disp: 90 tablet, Rfl: 3 .  blood glucose meter kit and supplies KIT, Dispense based on patient and insurance preference. Use up to four times daily as directed. (FOR ICD-9 250.00, 250.01)., Disp: 1 each, Rfl: 0 .  cloNIDine (CATAPRES) 0.1 MG tablet, Take 1 tablet (0.1 mg total) by mouth 2 (two) times daily., Disp: 180 tablet, Rfl: 3 .  gabapentin (NEURONTIN) 400 MG capsule, Take 2 capsules (800 mg total) by mouth 3 (three) times daily., Disp: 540 capsule, Rfl: 3 .  glucose blood test strip, Use as instructed, Disp: 300 each, Rfl: 3 .    metFORMIN (GLUCOPHAGE-XR) 750 MG 24 hr tablet, Take 1 tablet (750 mg total) by mouth 2 (two) times daily., Disp: 180 tablet, Rfl: 3 .  Omega-3 Fatty Acids (FISH OIL) 1000 MG CAPS, Take 2 capsules by mouth daily., Disp: , Rfl:  .  omeprazole (PRILOSEC) 40 MG capsule, Take 1 capsule (40 mg total) by mouth daily., Disp: 90 capsule, Rfl: 3 .  ONETOUCH DELICA LANCETS 27O MISC, Use as directed to check blood glucose once daily, Disp: 100 each, Rfl: 3 .  telmisartan (MICARDIS) 40 MG tablet, Take 1 tablet (40 mg total) by mouth daily., Disp: 90 tablet, Rfl: 0 .  traZODone (DESYREL) 150 MG tablet, Take 1 tablet (150 mg total) by mouth at bedtime., Disp: 90 tablet, Rfl: 0 .  umeclidinium-vilanterol (ANORO ELLIPTA) 62.5-25 MCG/INH AEPB, Inhale 1 puff into the lungs daily., Disp: 90 each, Rfl: 3 .  Lancet Devices (MICROLET NEXT LANCING DEVICE) MISC, 1 each by Does not apply route daily. (Patient not taking: Reported on 12/13/2018), Disp: 1 each, Rfl: 0 .  tamsulosin (FLOMAX) 0.4 MG CAPS capsule, Take 1 capsule (0.4 mg total) by mouth daily. (Patient not taking: Reported on 12/13/2018), Disp: 90 capsule, Rfl: 3  Patient Care Team: [name], MD as PCP - General (Family Medicine) Anell Barr, [place] as Consulting  Physician (Optometry) Dionisio David, MD as Consulting Physician (Cardiology) Nori Riis, PA-C as Physician Assistant (Urology) Laverle Hobby, MD as Consulting Physician (Pulmonary Disease)     Objective:    Vitals: BP (!) 122/56 (BP Location: Right Arm, Patient Position: Sitting, Cuff Size: Large)   Pulse (!) 57   Temp 99.1 F (37.3 C) (Oral)   Ht 5' 10" (1.778 m)   Wt 223 lb 12.8 oz (101.5 kg)   BMI 32.11 kg/m   Physical Exam Vitals signs reviewed.  Constitutional:      General: He is not in acute distress.    Appearance: Normal appearance. He is well-developed. He is not diaphoretic.  HENT:     Head: Normocephalic and atraumatic.     Right Ear: Tympanic membrane, ear canal and external ear normal.     Left Ear: Tympanic membrane, ear canal and external ear normal.     Nose: Nose normal. No congestion.     Mouth/Throat:     Mouth: Mucous membranes are moist.     Pharynx: Oropharynx is clear. No oropharyngeal exudate.  Eyes:     General: No scleral icterus.    Conjunctiva/sclera: Conjunctivae normal.     Pupils: Pupils are equal, round, and reactive to light.  Neck:     Musculoskeletal: Neck supple.     Thyroid: No thyromegaly.  Cardiovascular:     Rate and Rhythm: Normal rate and regular rhythm.     Pulses: Normal pulses.     Heart sounds: Normal heart sounds. No murmur.  Pulmonary:     Effort: Pulmonary effort is normal. No respiratory distress.     Breath sounds: Normal breath sounds. No wheezing or rales.  Abdominal:     General: Bowel sounds are normal. There is no distension.     Palpations: Abdomen is soft.     Tenderness: There is no abdominal tenderness. There is no guarding or rebound.  Musculoskeletal:        General: No deformity.     Right lower leg: No edema.     Left lower leg: No edema.  Lymphadenopathy:     Cervical: No cervical adenopathy.  Skin:  General: Skin is warm and dry.     Capillary Refill: Capillary refill takes  less than 2 seconds.     Findings: No rash.  Neurological:     Mental Status: He is alert and oriented to person, place, and time. Mental status is at baseline.     Gait: Gait normal.  Psychiatric:        Mood and Affect: Mood normal.        Behavior: Behavior normal.        Thought Content: Thought content normal.     Activities of Daily Living In your present state of health, do you have any difficulty performing the following activities: 12/13/2018 01/24/2018  Hearing? N N  Vision? N N  Difficulty concentrating or making decisions? N Y  Walking or climbing stairs? N N  Dressing or bathing? N N  Doing errands, shopping? N N  Preparing Food and eating ? N N  Using the Toilet? N N  In the past six months, have you accidently leaked urine? Y N  Comment Occasional leakage. -  Do you have problems with loss of bowel control? N N  Managing your Medications? N N  Managing your Finances? N N  Housekeeping or managing your Housekeeping? N N  Some recent data might be hidden    Fall Risk Assessment Fall Risk  12/13/2018 12/13/2018 01/24/2018 08/30/2017 08/11/2017  Falls in the past year? 0 0 No No No     Depression Screen PHQ 2/9 Scores 12/13/2018 01/24/2018 10/03/2017 10/03/2017  PHQ - 2 Score 0 0 4 0  PHQ- 9 Score - - 13 -    6CIT Screen 12/13/2018  What Year? 0 points  What month? 0 points  What time? 0 points  Count back from 20 0 points  Months in reverse 0 points  Repeat phrase 0 points  Total Score 0       Assessment & Plan:    Annual Physical Reviewed patient's Family Medical History Reviewed and updated list of patient's medical providers Assessment of cognitive impairment was done Assessed patient's functional ability Established a written schedule for health screening services Health Risk Assessent Completed and Reviewed  Exercise Activities and Dietary recommendations Goals    . DIET - DECREASE SODA OR JUICE INTAKE     Recommend to cut back on soda intake  to 1 glass a day and substitute with water.     . DIET - INCREASE WATER INTAKE     Recommend drinking at least 6-8 glasses of water a day     . Increase water intake     Recommend increasing water intake to at least 3 glasses a day.       Immunization History  Administered Date(s) Administered  . Influenza, High Dose Seasonal PF 07/17/2015, 07/29/2016, 07/15/2017  . Influenza, Seasonal, Injecte, Preservative Fre 08/14/2008  . Influenza,inj,Quad PF,6+ Mos 07/20/2013, 06/20/2014  . Influenza,inj,quad, With Preservative 08/18/2018  . Influenza-Unspecified 08/18/2018  . Pneumococcal Conjugate-13 08/15/2014  . Pneumococcal Polysaccharide-23 05/11/2012  . Tdap 05/29/2014    Health Maintenance  Topic Date Due  . OPHTHALMOLOGY EXAM  02/21/1956  . FOOT EXAM  03/11/2019  . HEMOGLOBIN A1C  04/24/2019  . COLONOSCOPY  01/28/2022  . TETANUS/TDAP  05/29/2024  . INFLUENZA VACCINE  Completed  . Hepatitis C Screening  Completed  . PNA vac Low Risk Adult  Completed     Discussed health benefits of physical activity, and encouraged him to engage in regular exercise appropriate for his   age and condition.    ------------------------------------------------------------------------------------------------------------  Problem List Items Addressed This Visit      Cardiovascular and Mediastinum   Hypertension    Well-controlled Continue current medications Recheck metabolic panel Follow-up in 6 months        Respiratory   Obstructive sleep apnea    Uncontrolled With nonrestorative sleep and nighttime awakenings as well as snoring Discussed with the patient again that his insomnia is related to his untreated sleep apnea We will attempt to obtain CPAP and necessary supplies for the patient to start using this Discussed potential health risks of uncontrolled sleep apnea        Endocrine   Type 2 diabetes mellitus with hyperglycemia, without long-term current use of insulin (HCC)     Has upcoming follow-up for diabetes care Reordered his test strips for twice daily testing today Will obtain last eye exam with ROI completed today      Relevant Medications   glucose blood test strip     Other   Dyslipidemia    Previously well controlled Continue Lipitor Recheck fasting lipid panel and CMP      Relevant Orders   Comprehensive metabolic panel (Completed)   Lipid panel (Completed)   Insomnia    As I discussed with the patient, this is most likely related to his lack of CPAP use and uncontrolled sleep apnea He can continue trazodone 150 mg nightly Avoid benzos and other controlled substances Encourage good sleep hygiene practices      Class 1 obesity with serious comorbidity and body mass index (BMI) of 31.0 to 31.9 in adult    Discussed importance of healthy weight management, diet, exercise      Relevant Orders   Comprehensive metabolic panel (Completed)    Other Visit Diagnoses    Encounter for annual physical exam    -  Primary       Return in about 2 months (around 02/11/2019) for diabetes f/u.   The entirety of the information documented in the History of Present Illness, Review of Systems and Physical Exam were personally obtained by me. Portions of this information were initially documented by Nikki Walston, CMA and reviewed by me for thoroughness and accuracy.    ,  M, MD, MPH [facility] 12/14/2018 10:04 AM  

## 2018-12-14 ENCOUNTER — Telehealth: Payer: Self-pay

## 2018-12-14 LAB — COMPREHENSIVE METABOLIC PANEL
A/G RATIO: 1.9 (ref 1.2–2.2)
ALT: 37 IU/L (ref 0–44)
AST: 22 IU/L (ref 0–40)
Albumin: 4.6 g/dL (ref 3.7–4.7)
Alkaline Phosphatase: 63 IU/L (ref 39–117)
BILIRUBIN TOTAL: 0.7 mg/dL (ref 0.0–1.2)
BUN/Creatinine Ratio: 21 (ref 10–24)
BUN: 19 mg/dL (ref 8–27)
CALCIUM: 9.5 mg/dL (ref 8.6–10.2)
CHLORIDE: 101 mmol/L (ref 96–106)
CO2: 20 mmol/L (ref 20–29)
Creatinine, Ser: 0.92 mg/dL (ref 0.76–1.27)
GFR calc Af Amer: 96 mL/min/{1.73_m2} (ref >59)
GFR calc non Af Amer: 83 mL/min/{1.73_m2} (ref >59)
Globulin, Total: 2.4 g/dL (ref 1.5–4.5)
Glucose: 116 mg/dL — ABNORMAL HIGH (ref 65–99)
POTASSIUM: 4.4 mmol/L (ref 3.5–5.2)
Sodium: 141 mmol/L (ref 134–144)
Total Protein: 7 g/dL (ref 6.0–8.5)

## 2018-12-14 LAB — LIPID PANEL
CHOLESTEROL TOTAL: 95 mg/dL — AB (ref 100–199)
Chol/HDL Ratio: 3.7 ratio (ref 0.0–5.0)
HDL: 26 mg/dL — AB (ref >39)
LDL Calculated: 41 mg/dL (ref 0–99)
TRIGLYCERIDES: 140 mg/dL (ref 0–149)
VLDL Cholesterol Cal: 28 mg/dL (ref 5–40)

## 2018-12-14 NOTE — Assessment & Plan Note (Signed)
As I discussed with the patient, this is most likely related to his lack of CPAP use and uncontrolled sleep apnea He can continue trazodone 150 mg nightly Avoid benzos and other controlled substances Encourage good sleep hygiene practices

## 2018-12-14 NOTE — Telephone Encounter (Signed)
Patient was advised.  

## 2018-12-14 NOTE — Assessment & Plan Note (Signed)
Previously well controlled Continue Lipitor Recheck fasting lipid panel and CMP

## 2018-12-14 NOTE — Assessment & Plan Note (Signed)
Well-controlled Continue current medications Recheck metabolic panel Follow-up in 6 months

## 2018-12-14 NOTE — Assessment & Plan Note (Signed)
Uncontrolled With nonrestorative sleep and nighttime awakenings as well as snoring Discussed with the patient again that his insomnia is related to his untreated sleep apnea We will attempt to obtain CPAP and necessary supplies for the patient to start using this Discussed potential health risks of uncontrolled sleep apnea

## 2018-12-14 NOTE — Assessment & Plan Note (Signed)
Has upcoming follow-up for diabetes care Reordered his test strips for twice daily testing today Will obtain last eye exam with ROI completed today

## 2018-12-14 NOTE — Telephone Encounter (Signed)
Pt returned call ° °teri °

## 2018-12-14 NOTE — Assessment & Plan Note (Signed)
Discussed importance of healthy weight management, diet, exercise

## 2018-12-14 NOTE — Telephone Encounter (Signed)
Attempted to contact patient, no answer and voicemail has not been set up.

## 2018-12-14 NOTE — Telephone Encounter (Signed)
-----   Message from [name], MD sent at 12/14/2018 10:13 AM EST ----- Normal labs

## 2018-12-15 ENCOUNTER — Telehealth: Payer: Self-pay | Admitting: Family Medicine

## 2018-12-15 NOTE — Telephone Encounter (Signed)
Tonya w/ [facility] [phone]  Faxed order is blurry or not readable.  Needing: Demographics info Face to Face notes regarding sleep study.  Refax order to  [phone].  Thanks, [employer]

## 2018-12-15 NOTE — Telephone Encounter (Signed)
My note from 2/26 and Dr [name] from 11/2017 will count as face to face notes.  Maybe we can reprint the sleep study?  It was in the procedures tab I think

## 2018-12-15 NOTE — Telephone Encounter (Signed)
Order, Demographics, and OV notes re faxed to [facility].

## 2018-12-18 ENCOUNTER — Telehealth: Payer: Self-pay

## 2018-12-18 NOTE — Telephone Encounter (Signed)
Patient has been advised. KW 

## 2018-12-18 NOTE — Telephone Encounter (Signed)
-----   Message from [name], MD sent at 12/16/2018  9:13 AM EST ----- Please let patient know that [facility] will call him to set up CPAP and supplies once it is approved through insurance ----- Message ----- From: Truitt Merle Sent: 12/15/2018   5:43 PM EST To: [name], MD  Review incoming fax

## 2018-12-19 ENCOUNTER — Other Ambulatory Visit: Payer: Self-pay

## 2018-12-19 DIAGNOSIS — F99 Mental disorder, not otherwise specified: Principal | ICD-10-CM

## 2018-12-19 DIAGNOSIS — F5105 Insomnia due to other mental disorder: Secondary | ICD-10-CM

## 2018-12-19 MED ORDER — TRAZODONE HCL 150 MG PO TABS: 150.0000 mg | ORAL_TABLET | Freq: Every day | ORAL | 3 refills | Status: DC

## 2018-12-19 NOTE — Telephone Encounter (Signed)
Patient not on Tramadol.  Suspect he means Trazodone. Rx sent

## 2018-12-19 NOTE — Telephone Encounter (Signed)
Patient request a refill for Tramadol to be sent into mail order pharmacy.

## 2018-12-26 DIAGNOSIS — G4733 Obstructive sleep apnea (adult) (pediatric): Secondary | ICD-10-CM | POA: Diagnosis not present

## 2019-01-09 ENCOUNTER — Other Ambulatory Visit: Payer: Self-pay

## 2019-01-09 DIAGNOSIS — K219 Gastro-esophageal reflux disease without esophagitis: Secondary | ICD-10-CM

## 2019-01-09 MED ORDER — OMEPRAZOLE 40 MG PO CPDR: 40.0000 mg | DELAYED_RELEASE_CAPSULE | Freq: Every day | ORAL | 3 refills | Status: DC

## 2019-01-09 NOTE — Telephone Encounter (Signed)
Patient called requesting refills on medication. Last OV was 11/2018. Med sent into mail order pharmacy.

## 2019-01-18 ENCOUNTER — Telehealth: Payer: Self-pay

## 2019-01-18 NOTE — Telephone Encounter (Signed)
But we just discussed at his visit that he does need it and needs to be using it

## 2019-01-18 NOTE — Telephone Encounter (Signed)
Patient called office requesting prescription from doctor so his Cpap machine can be removed from his home. Patient states he no longer needs it and [facility] told him they would pick up machine if he had a order from doctor. Please advise. KW

## 2019-01-19 NOTE — Telephone Encounter (Signed)
Patient returned phone call and states he can not sleep with the C-Pap machine on and not going to use it.

## 2019-01-19 NOTE — Telephone Encounter (Signed)
LVMTRC 

## 2019-01-22 NOTE — Telephone Encounter (Signed)
Ok. I guess we can call [facility] and tell them it's ok to pick it up

## 2019-01-24 ENCOUNTER — Ambulatory Visit: Payer: Medicare Other | Admitting: Family Medicine

## 2019-01-24 ENCOUNTER — Telehealth: Payer: Self-pay

## 2019-01-24 NOTE — Telephone Encounter (Signed)
D/C order faxed to [facility]. Patient advised.

## 2019-01-24 NOTE — Telephone Encounter (Signed)
Patient called office requesting Dr. B put in order to [facility] to come pick up his CPap machine at home. Patient states that he no longer uses machine. KW

## 2019-01-24 NOTE — Telephone Encounter (Signed)
D/C order faxed to [facility] at [phone]. Patient advised.

## 2019-01-30 ENCOUNTER — Ambulatory Visit: Payer: Self-pay

## 2019-02-08 NOTE — Telephone Encounter (Signed)
error 

## 2019-02-09 ENCOUNTER — Other Ambulatory Visit: Payer: Self-pay | Admitting: Family Medicine

## 2019-02-09 DIAGNOSIS — E119 Type 2 diabetes mellitus without complications: Secondary | ICD-10-CM

## 2019-02-13 ENCOUNTER — Telehealth: Payer: Self-pay

## 2019-02-13 NOTE — Telephone Encounter (Addendum)
Juan Le with [facility] is requesting that insurance claim be resubmitted, and also provider line need updated information. Patient is being billed half in network and half out of network. Claim # [id number]

## 2019-02-14 NOTE — Telephone Encounter (Signed)
What insurance claim? For a visit?

## 2019-02-14 NOTE — Telephone Encounter (Signed)
Ladies, can you get this claim resubmitted for last OV?  I don't know what she means by provider line needs to be updated??

## 2019-02-14 NOTE — Telephone Encounter (Signed)
Yes for patient's last visit is what Morey Hummingbird said. I was asking her questions to clarify exactly what she needed and she said "she will know what I am talking about"

## 2019-02-20 ENCOUNTER — Other Ambulatory Visit: Payer: Self-pay

## 2019-02-20 NOTE — Patient Outreach (Signed)
[facility]) Care Management  02/20/2019  [name] 02-25-1946 [id number]   Medication Adherence call to [name] HIPPA Compliant Voice message left with a call back number. [name] is showing past due on Telmisartan 40 mg under [place].   [facility] Management Direct Dial [phone]  Fax [phone] [name]

## 2019-02-21 NOTE — Telephone Encounter (Signed)
Per Arbie Cookey and Judson Roch, we are unable to resubmit claims from our end.  Patient has to call insurance company and ask for it to be resubmitted.

## 2019-02-21 NOTE — Telephone Encounter (Signed)
LMTCB

## 2019-02-21 NOTE — Telephone Encounter (Signed)
Does anyone know what this is talking about?  I think we need to resubmit last claim.  Resending, because I didn't hear anything from last message. Thanks!

## 2019-02-22 NOTE — Telephone Encounter (Signed)
Patient advised. He states the message below was meant for Dr. Ellin Mayhew. [employer] called the wrong office. He states he has contacted [facility] to get this straightened out.

## 2019-03-05 ENCOUNTER — Telehealth: Payer: Self-pay | Admitting: Family Medicine

## 2019-03-05 DIAGNOSIS — I10 Essential (primary) hypertension: Secondary | ICD-10-CM

## 2019-03-05 MED ORDER — TELMISARTAN 40 MG PO TABS: 40.0000 mg | ORAL_TABLET | Freq: Every day | ORAL | 1 refills | Status: DC

## 2019-03-05 NOTE — Telephone Encounter (Signed)
Pt called needing a prescription   Telmisartan 40 [facility] Rx  CB#  [phone]  Thanks Con Memos

## 2019-03-08 ENCOUNTER — Ambulatory Visit (INDEPENDENT_AMBULATORY_CARE_PROVIDER_SITE_OTHER): Payer: Medicare Other | Admitting: Family Medicine

## 2019-03-08 ENCOUNTER — Encounter: Payer: Self-pay | Admitting: Family Medicine

## 2019-03-08 ENCOUNTER — Telehealth: Payer: Self-pay

## 2019-03-08 VITALS — BP 147/84 | HR 92

## 2019-03-08 DIAGNOSIS — E1165 Type 2 diabetes mellitus with hyperglycemia: Secondary | ICD-10-CM

## 2019-03-08 DIAGNOSIS — J449 Chronic obstructive pulmonary disease, unspecified: Secondary | ICD-10-CM

## 2019-03-08 DIAGNOSIS — G63 Polyneuropathy in diseases classified elsewhere: Secondary | ICD-10-CM | POA: Diagnosis not present

## 2019-03-08 DIAGNOSIS — I252 Old myocardial infarction: Secondary | ICD-10-CM

## 2019-03-08 DIAGNOSIS — E785 Hyperlipidemia, unspecified: Secondary | ICD-10-CM | POA: Diagnosis not present

## 2019-03-08 DIAGNOSIS — I10 Essential (primary) hypertension: Secondary | ICD-10-CM | POA: Diagnosis not present

## 2019-03-08 NOTE — Telephone Encounter (Signed)
Patient's wife Butch Penny called stating that patient was supposed to come by the office this afternoon for a A1C and blood pressure check. Patient is not able to make it today, and would like to come in the morning. Please call patient to let him know if he can come by the office in the morning for this.

## 2019-03-08 NOTE — Assessment & Plan Note (Signed)
Previously well controlled Continue current meds Will get BP check this afternoon when he gets A1c from his car Next metabolic panel in ~3 months

## 2019-03-08 NOTE — Assessment & Plan Note (Signed)
Chronic, well controlled Continue current meds

## 2019-03-08 NOTE — Assessment & Plan Note (Signed)
Continue lipitor Next FLP in 11/2019

## 2019-03-08 NOTE — Assessment & Plan Note (Signed)
Patient with history of MI status post CABG and CAD status post stenting He is asymptomatic Followed by cardiology Discussed importance of good risk factor control Continue aspirin, statin, and antihypertensives

## 2019-03-08 NOTE — Assessment & Plan Note (Signed)
Chronic and well controlled Related to DM Continue gabapentin

## 2019-03-08 NOTE — Assessment & Plan Note (Signed)
Previously near goal with last A1c 7 Will drive in for E1Y check this afternoon uptodate on screenings and vaccines On statin and ARB F/u in 3 months

## 2019-03-08 NOTE — Progress Notes (Signed)
Patient: Juan Le. Male    DOB: [birth year]   73 y.o.   MRN: [id number] Visit Date: 03/08/2019  Today's Provider: [name], MD   Chief Complaint  Patient presents with  . Diabetes   Subjective:    Virtual Visit via Telephone Note  I connected with Juan Le. on 03/08/19 at  9:40 AM EDT by a audio-only enabled telemedicine application and verified that I am speaking with the correct person using two identifiers.  Patient unable to do video visit due to lack of connectivity.  Patient location: home Provider location: [place] involved in the visit: patient, provider    I discussed the limitations of evaluation and management by telemedicine and the availability of in person appointments. The patient expressed understanding and agreed to proceed.  HPI  Diabetes Mellitus Type II, Follow-up:   Lab Results  Component Value Date   HGBA1C 7.0 (A) 10/24/2018   HGBA1C 6.0 (A) 06/12/2018   HGBA1C 5.4 11/29/2017    Last seen for diabetes 2 months ago.  Management since then includes no changes He reports good compliance with treatment. He is not having side effects.  Current symptoms include none  Home blood sugar records: fasting range: 137 this morning  Episodes of hypoglycemia? no   Current Insulin Regimen: none Most Recent Eye Exam: 11/21/2018 Weight trend: stable Current exercise: walking Current diet habits: in general, a "healthy" diet    Pertinent Labs:    Component Value Date/Time   CHOL 95 (L) 12/13/2018 1036   TRIG 140 12/13/2018 1036   HDL 26 (L) 12/13/2018 1036   LDLCALC 41 12/13/2018 1036   LDLCALC 68 11/29/2017 0936   CREATININE 0.92 12/13/2018 1036   CREATININE 1.03 11/29/2017 0936    Wt Readings from Last 3 Encounters:  12/13/18 223 lb 12.8 oz (101.5 kg)  12/13/18 223 lb 12.8 oz (101.5 kg)  10/24/18 224 lb 12.8 oz (102 kg)    ------------------------------------------------------------------------ Thinks BP has been well controlled Taking statin with good compliance   Allergies  Allergen Reactions  . Ace Inhibitors Cough  . Diphenhydramine     stomach upset     Current Outpatient Medications:  .  aspirin 81 MG tablet, Take 1 tablet by mouth daily., Disp: , Rfl:  .  atorvastatin (LIPITOR) 40 MG tablet, Take 1 tablet (40 mg total) by mouth daily at 6 PM., Disp: 90 tablet, Rfl: 3 .  Blood Glucose Monitoring Suppl (ONETOUCH VERIO) w/Device KIT, USE UP TO 4 TIMES DAILY AS  DIRECTED, Disp: 1 kit, Rfl: 0 .  cloNIDine (CATAPRES) 0.1 MG tablet, Take 1 tablet (0.1 mg total) by mouth 2 (two) times daily., Disp: 180 tablet, Rfl: 3 .  gabapentin (NEURONTIN) 400 MG capsule, Take 2 capsules (800 mg total) by mouth 3 (three) times daily., Disp: 540 capsule, Rfl: 3 .  glucose blood test strip, Use as instructed to test blood sugar 2 times daily, Disp: 300 each, Rfl: 3 .  Lancet Devices (MICROLET NEXT LANCING DEVICE) MISC, 1 each by Does not apply route daily., Disp: 1 each, Rfl: 0 .  metFORMIN (GLUCOPHAGE-XR) 750 MG 24 hr tablet, Take 1 tablet (750 mg total) by mouth 2 (two) times daily., Disp: 180 tablet, Rfl: 3 .  Omega-3 Fatty Acids (FISH OIL) 1000 MG CAPS, Take 2 capsules by mouth daily., Disp: , Rfl:  .  omeprazole (PRILOSEC) 40 MG capsule, Take 1 capsule (40 mg total) by mouth daily., Disp:  90 capsule, Rfl: 3 .  ONETOUCH DELICA LANCETS 53M MISC, Use as directed to check blood glucose once daily, Disp: 100 each, Rfl: 3 .  telmisartan (MICARDIS) 40 MG tablet, Take 1 tablet (40 mg total) by mouth daily., Disp: 90 tablet, Rfl: 1 .  traZODone (DESYREL) 150 MG tablet, Take 1 tablet (150 mg total) by mouth at bedtime., Disp: 90 tablet, Rfl: 3 .  umeclidinium-vilanterol (ANORO ELLIPTA) 62.5-25 MCG/INH AEPB, Inhale 1 puff into the lungs daily., Disp: 90 each, Rfl: 3 .  tamsulosin (FLOMAX) 0.4 MG CAPS capsule, Take 1 capsule (0.4  mg total) by mouth daily. (Patient not taking: Reported on 03/08/2019), Disp: 90 capsule, Rfl: 3  Review of Systems  Constitutional: Negative.   Respiratory: Negative.   Cardiovascular: Negative.   Endocrine: Negative.   Musculoskeletal: Negative.     Social History   Tobacco Use  . Smoking status: Former Smoker    Types: Cigarettes  . Smokeless tobacco: Never Used  . Tobacco comment: former light smoker; quit in his teens  Substance Use Topics  . Alcohol use: No    Alcohol/week: 0.0 standard drinks      Objective:   There were no vitals taken for this visit. There were no vitals filed for this visit.   Physical Exam      Assessment & Plan    I discussed the assessment and treatment plan with the patient. The patient was provided an opportunity to ask questions and all were answered. The patient agreed with the plan and demonstrated an understanding of the instructions.   The patient was advised to call back or seek an in-person evaluation if the symptoms worsen or if the condition fails to improve as anticipated.  Problem List Items Addressed This Visit      Cardiovascular and Mediastinum   Hypertension    Previously well controlled Continue current meds Will get BP check this afternoon when he gets A1c from his car Next metabolic panel in ~3 months        Respiratory   COPD with chronic bronchitis and emphysema (HCC)    Chronic, well controlled Continue current meds        Endocrine   Type 2 diabetes mellitus with hyperglycemia, without long-term current use of insulin ([place]) - Primary    Previously near goal with last A1c 7 Will drive in for I6O check this afternoon uptodate on screenings and vaccines On statin and ARB F/u in 3 months        Nervous and Auditory   Peripheral neuropathy    Chronic and well controlled Related to DM Continue gabapentin        Other   Dyslipidemia    Continue lipitor Next FLP in 11/2019      History of acute  myocardial infarction    Patient with history of MI status post CABG and CAD status post stenting He is asymptomatic Followed by cardiology Discussed importance of good risk factor control Continue aspirin, statin, and antihypertensives          Return in about 3 months (around 06/08/2019) for chronic disease f/u.   The entirety of the information documented in the History of Present Illness, Review of Systems and Physical Exam were personally obtained by me. Portions of this information were initially documented by [name], CMA and reviewed by me for thoroughness and accuracy.    , Dionne Bucy, MD MPH [facility]

## 2019-03-08 NOTE — Telephone Encounter (Signed)
Patient is coming tomorrow at 1:45pm for A1C and BP check.

## 2019-03-09 LAB — POCT GLYCOSYLATED HEMOGLOBIN (HGB A1C): Hemoglobin A1C: 6.2 % — AB (ref 4.0–5.6)

## 2019-03-09 NOTE — Addendum Note (Signed)
Addended by: Jules Schick on: 03/09/2019 01:22 PM   Modules accepted: Orders

## 2019-05-15 ENCOUNTER — Ambulatory Visit (INDEPENDENT_AMBULATORY_CARE_PROVIDER_SITE_OTHER): Payer: Medicare Other | Admitting: Family Medicine

## 2019-05-15 ENCOUNTER — Ambulatory Visit
Admission: RE | Admit: 2019-05-15 | Discharge: 2019-05-15 | Disposition: A | Discharge status: Home / Self Care | Payer: Medicare Other | Source: Ambulatory Visit | Attending: Family Medicine | Admitting: Family Medicine

## 2019-05-15 ENCOUNTER — Telehealth: Payer: Self-pay

## 2019-05-15 ENCOUNTER — Other Ambulatory Visit: Payer: Self-pay

## 2019-05-15 ENCOUNTER — Encounter: Payer: Self-pay | Admitting: Family Medicine

## 2019-05-15 VITALS — BP 154/75 | HR 66

## 2019-05-15 DIAGNOSIS — R42 Dizziness and giddiness: Secondary | ICD-10-CM | POA: Diagnosis not present

## 2019-05-15 DIAGNOSIS — R29898 Other symptoms and signs involving the musculoskeletal system: Secondary | ICD-10-CM | POA: Diagnosis not present

## 2019-05-15 DIAGNOSIS — S0990XA Unspecified injury of head, initial encounter: Secondary | ICD-10-CM | POA: Diagnosis not present

## 2019-05-15 DIAGNOSIS — G3281 Cerebellar ataxia in diseases classified elsewhere: Secondary | ICD-10-CM

## 2019-05-15 DIAGNOSIS — R29818 Other symptoms and signs involving the nervous system: Secondary | ICD-10-CM

## 2019-05-15 DIAGNOSIS — W19XXXA Unspecified fall, initial encounter: Secondary | ICD-10-CM

## 2019-05-15 NOTE — Telephone Encounter (Signed)
Received a telephone advice record from Wayne Sever, RN @ access nurse that Juan Le with [facility] [phone] called with stat labs stating patient had "Head CT showed no acute intracranial pathology. Small vessel white matter disease. Consider MRI for further radiologic testing." Also [name] had stated that she notified Dr. Ancil Boozer of the stat radiologist Head CT results and Dr. Ancil Boozer stated that she would send a message to patient's doctor. FYI

## 2019-05-15 NOTE — Telephone Encounter (Signed)
-----   Message from [name], MD sent at 05/15/2019 12:58 PM EDT ----- Please let patient know that no acute stroke or bleeding noted on CT. Given the time frame, should get an MRI brain to further assess. I can order if ok with patient. If any new neurologic symptoms, needs to go to the emergency room

## 2019-05-15 NOTE — Progress Notes (Signed)
Patient: Juan Le. Male    DOB: 31-Dec-1945   73 y.o.   MRN: [id number] Visit Date: 05/15/2019  Today's Provider: [name], MD   Chief Complaint  Patient presents with  . Dizziness   Subjective:     HPI    Virtual Visit via Telephone Note  I connected with Juan Le. on 05/15/19 at 10:20 AM EDT by telephone and verified that I am speaking with the correct person using two identifiers.   Patient location: home Provider location: home office Persons involved in the visit: patient, provider   I discussed the limitations, risks, security and privacy concerns of performing an evaluation and management service by telephone and the availability of in person appointments. I also discussed with the patient that there may be a patient responsible charge related to this service. The patient expressed understanding and agreed to proceed.   Patient has complaints of dizziness for the last 3 to 4 days. He states that he lost his balance, fell, and hit his head on the bathroom counter yesterday. His blood sugars have been more elevated than usual. They are ranging between 166-186 fasting. His blood pressure was slightly elevated this morning. He also states that if he gets up too fast he feels very dizzy.    Feels like L leg wants "to shift over" and it causes him to lose his balance. Feels weak.  New problem within the last 2-3 days and worse last night.  Blurry vision after falling and hitting his head last night.  Vision feels back to normal now. Unable o walk in straight line.  All happened abruptly ~3 days ago.     Allergies  Allergen Reactions  . Ace Inhibitors Cough  . Diphenhydramine     stomach upset     Current Outpatient Medications:  .  aspirin 81 MG tablet, Take 1 tablet by mouth daily., Disp: , Rfl:  .  atorvastatin (LIPITOR) 40 MG tablet, Take 1 tablet (40 mg total) by mouth daily at 6 PM., Disp: 90 tablet, Rfl: 3 .  Blood Glucose  Monitoring Suppl (ONETOUCH VERIO) w/Device KIT, USE UP TO 4 TIMES DAILY AS  DIRECTED, Disp: 1 kit, Rfl: 0 .  cloNIDine (CATAPRES) 0.1 MG tablet, Take 1 tablet (0.1 mg total) by mouth 2 (two) times daily., Disp: 180 tablet, Rfl: 3 .  gabapentin (NEURONTIN) 400 MG capsule, Take 2 capsules (800 mg total) by mouth 3 (three) times daily., Disp: 540 capsule, Rfl: 3 .  glucose blood test strip, Use as instructed to test blood sugar 2 times daily, Disp: 300 each, Rfl: 3 .  Lancet Devices (MICROLET NEXT LANCING DEVICE) MISC, 1 each by Does not apply route daily., Disp: 1 each, Rfl: 0 .  metFORMIN (GLUCOPHAGE-XR) 750 MG 24 hr tablet, Take 1 tablet (750 mg total) by mouth 2 (two) times daily., Disp: 180 tablet, Rfl: 3 .  Omega-3 Fatty Acids (FISH OIL) 1000 MG CAPS, Take 2 capsules by mouth daily., Disp: , Rfl:  .  omeprazole (PRILOSEC) 40 MG capsule, Take 1 capsule (40 mg total) by mouth daily., Disp: 90 capsule, Rfl: 3 .  telmisartan (MICARDIS) 40 MG tablet, Take 1 tablet (40 mg total) by mouth daily., Disp: 90 tablet, Rfl: 1 .  traZODone (DESYREL) 150 MG tablet, Take 1 tablet (150 mg total) by mouth at bedtime., Disp: 90 tablet, Rfl: 3 .  umeclidinium-vilanterol (ANORO ELLIPTA) 62.5-25 MCG/INH AEPB, Inhale 1 puff into the lungs  daily., Disp: 90 each, Rfl: 3 .  ONETOUCH DELICA LANCETS 42J MISC, Use as directed to check blood glucose once daily, Disp: 100 each, Rfl: 3 .  tamsulosin (FLOMAX) 0.4 MG CAPS capsule, Take 1 capsule (0.4 mg total) by mouth daily. (Patient not taking: Reported on 03/08/2019), Disp: 90 capsule, Rfl: 3  Review of Systems  Neurological: Positive for dizziness.  All other systems reviewed and are negative.   Social History   Tobacco Use  . Smoking status: Former Smoker    Types: Cigarettes  . Smokeless tobacco: Never Used  . Tobacco comment: former light smoker; quit in his teens  Substance Use Topics  . Alcohol use: No    Alcohol/week: 0.0 standard drinks      Objective:    BP (!) 154/75 (BP Location: Right Arm, Patient Position: Sitting, Cuff Size: Normal)   Pulse 66  Vitals:   05/15/19 0921  BP: (!) 154/75  Pulse: 66     Physical Exam Speaks in full sentences with no dysarthria.  No results found for any visits on 05/15/19.     Assessment & Plan  I discussed the assessment and treatment plan with the patient. The patient was provided an opportunity to ask questions and all were answered. The patient agreed with the plan and demonstrated an understanding of the instructions.   The patient was advised to call back or seek an in-person evaluation if the symptoms worsen or if the condition fails to improve as anticipated.  I provided 25 minutes of non-face-to-face time during this encounter.  1. Dizziness 2. Left leg weakness 3. Fall, initial encounter 4. Cerebellar ataxia in diseases classified elsewhere [facility]) - patient with new, abrupt onset of neuro symptoms including ataxia, L leg weakness, and dizziness 3-4 days ago - concern for acute CVA given abrupt onset - discussed that slightly elevated blood sugars would not give these symptoms - as he is outside of 48 hour window, will not send to ED, but discussed that if he develops any new or worsening neuro symptoms, he needs to go to ED immediately - will allow permissive HTN given possibility of recent stroke - will need strict risk factor modification - STAT CT head to also rule out any bleed from fall yesterday - CT Head Wo Contrast; Future     The entirety of the information documented in the History of Present Illness, Review of Systems and Physical Exam were personally obtained by me. Portions of this information were initially documented by [facility], CMA and reviewed by me for thoroughness and accuracy.    Lonia Roane, Dionne Bucy, MD MPH [facility]

## 2019-05-15 NOTE — Telephone Encounter (Signed)
Patient notified of results. He would like to proceed with the MRI.

## 2019-05-16 ENCOUNTER — Telehealth: Payer: Self-pay | Admitting: Family Medicine

## 2019-05-16 DIAGNOSIS — F99 Mental disorder, not otherwise specified: Secondary | ICD-10-CM

## 2019-05-16 DIAGNOSIS — F5105 Insomnia due to other mental disorder: Secondary | ICD-10-CM

## 2019-05-16 MED ORDER — TRAZODONE HCL 100 MG PO TABS: 200.0000 mg | ORAL_TABLET | Freq: Every day | ORAL | 1 refills | Status: DC

## 2019-05-16 NOTE — Telephone Encounter (Signed)
MRI was ordered.  Someone will call to schedule this.  I know that he has a coronary stent - does he have any other implanted devices?  Does he have the card from the coronary stent? They will need to make sure this is MRI safe.

## 2019-05-16 NOTE — Telephone Encounter (Signed)
Pt called saying he talked to Dr. B about getting a prescription for Trazodone sent to [facility].  He said an order was supposed to be faxed yesterday.  He said he called them but they have not seen anything yet  CB#  [phone]  [place]

## 2019-05-16 NOTE — Telephone Encounter (Signed)
rx sent

## 2019-05-16 NOTE — Telephone Encounter (Signed)
Patient states that he does not have any other implanted devices and he did have a card from the coronary stent but have misplaced it due to changing his wallet.FYI

## 2019-05-16 NOTE — Telephone Encounter (Signed)
Patient was advised.  

## 2019-05-17 NOTE — Patient Instructions (Signed)

## 2019-05-23 ENCOUNTER — Telehealth: Payer: Self-pay | Admitting: Family Medicine

## 2019-05-23 DIAGNOSIS — I10 Essential (primary) hypertension: Secondary | ICD-10-CM

## 2019-05-23 NOTE — Telephone Encounter (Signed)
Per [facility] pt will need to get labs to check kidney function before MRI is done. Can you place order ?

## 2019-05-23 NOTE — Telephone Encounter (Signed)
Patient advised. He states he will come tomorrow for labs.

## 2019-05-23 NOTE — Telephone Encounter (Signed)
Labs ordered. Patient can come by the office to have them drawn. Does not need to be fasting. Please let patient know.

## 2019-05-24 DIAGNOSIS — I10 Essential (primary) hypertension: Secondary | ICD-10-CM | POA: Diagnosis not present

## 2019-05-25 ENCOUNTER — Telehealth: Payer: Self-pay

## 2019-05-25 LAB — BASIC METABOLIC PANEL
BUN/Creatinine Ratio: 17 (ref 10–24)
BUN: 18 mg/dL (ref 8–27)
CO2: 19 mmol/L — ABNORMAL LOW (ref 20–29)
Calcium: 9.6 mg/dL (ref 8.6–10.2)
Chloride: 102 mmol/L (ref 96–106)
Creatinine, Ser: 1.04 mg/dL (ref 0.76–1.27)
GFR calc Af Amer: 82 mL/min/{1.73_m2} (ref >59)
GFR calc non Af Amer: 71 mL/min/{1.73_m2} (ref >59)
Glucose: 139 mg/dL — ABNORMAL HIGH (ref 65–99)
Potassium: 4 mmol/L (ref 3.5–5.2)
Sodium: 141 mmol/L (ref 134–144)

## 2019-05-25 NOTE — Telephone Encounter (Signed)
-----   Message from Margo Common, [place] sent at 05/25/2019  8:55 AM EDT ----- Kidney function tests normal. May proceed with MRI scan. Let radiology know results.

## 2019-05-25 NOTE — Telephone Encounter (Signed)
Patient advised.

## 2019-05-29 ENCOUNTER — Ambulatory Visit
Admission: RE | Admit: 2019-05-29 | Discharge: 2019-05-29 | Disposition: A | Discharge status: Home / Self Care | Payer: Medicare Other | Source: Ambulatory Visit | Attending: Family Medicine | Admitting: Family Medicine

## 2019-05-29 ENCOUNTER — Other Ambulatory Visit: Payer: Self-pay

## 2019-05-29 DIAGNOSIS — R29818 Other symptoms and signs involving the nervous system: Secondary | ICD-10-CM | POA: Diagnosis not present

## 2019-05-29 DIAGNOSIS — R2681 Unsteadiness on feet: Secondary | ICD-10-CM | POA: Diagnosis not present

## 2019-05-29 DIAGNOSIS — G3281 Cerebellar ataxia in diseases classified elsewhere: Secondary | ICD-10-CM | POA: Diagnosis not present

## 2019-05-29 DIAGNOSIS — I6782 Cerebral ischemia: Secondary | ICD-10-CM | POA: Diagnosis not present

## 2019-05-29 DIAGNOSIS — R270 Ataxia, unspecified: Secondary | ICD-10-CM | POA: Diagnosis not present

## 2019-05-29 MED ORDER — GADOBUTROL 1 MMOL/ML IV SOLN
10.0000 mL | Freq: Once | INTRAVENOUS | Status: AC | PRN
  Administered 2019-05-29: 10 mL via INTRAVENOUS

## 2019-05-30 ENCOUNTER — Telehealth: Payer: Self-pay

## 2019-05-30 ENCOUNTER — Telehealth: Payer: Self-pay | Admitting: Family Medicine

## 2019-05-30 DIAGNOSIS — R42 Dizziness and giddiness: Secondary | ICD-10-CM

## 2019-05-30 NOTE — Telephone Encounter (Signed)
See other phone note. Referral placed.

## 2019-05-30 NOTE — Telephone Encounter (Signed)
-----   Message from [name], MD sent at 05/30/2019 11:57 AM EDT ----- No stroke on MRI.  Does have mastoid effusion that could contribute to balance issues. Would recommend ENT referral.

## 2019-05-30 NOTE — Telephone Encounter (Signed)
Referral placed.

## 2019-05-30 NOTE — Telephone Encounter (Signed)
Patient would like to be referred to Dr. Pryor Ochoa

## 2019-05-30 NOTE — Telephone Encounter (Signed)
Patient was advised and states that he is fine with the ENT referral.

## 2019-05-30 NOTE — Chronic Care Management (AMB) (Signed)
Chronic Care Management   Note  05/30/2019 Name: Juan Le. MRN: [id number] DOB: 1946/01/05  Juan Le. is a 73 y.o. year old male who is a primary care patient of Bacigalupo, Dionne Bucy, MD. I reached out to [employer]. by phone today in response to a referral sent by Mr. Juan SETO Jr.'s health plan.    Juan Le was given information about Chronic Care Management services today including:  1. CCM service includes personalized support from designated clinical staff supervised by his physician, including individualized plan of care and coordination with other care providers 2. 24/7 contact phone numbers for assistance for urgent and routine care needs. 3. Service will only be billed when office clinical staff spend 20 minutes or more in a month to coordinate care. 4. Only one practitioner may furnish and bill the service in a calendar month. 5. The patient may stop CCM services at any time (effective at the end of the month) by phone call to the office staff. 6. The patient will be responsible for cost sharing (co-pay) of up to 20% of the service fee (after annual deductible is met).  Patient agreed to services and verbal consent obtained.   Follow up plan: Telephone appointment with CCM team member scheduled for: 06/07/2019  [place]  ??[email or web address]   ??[phone]

## 2019-06-05 ENCOUNTER — Other Ambulatory Visit: Payer: Self-pay | Admitting: Family Medicine

## 2019-06-05 DIAGNOSIS — E119 Type 2 diabetes mellitus without complications: Secondary | ICD-10-CM

## 2019-06-06 ENCOUNTER — Other Ambulatory Visit: Payer: Self-pay

## 2019-06-06 DIAGNOSIS — R42 Dizziness and giddiness: Secondary | ICD-10-CM | POA: Diagnosis not present

## 2019-06-06 DIAGNOSIS — H6123 Impacted cerumen, bilateral: Secondary | ICD-10-CM | POA: Diagnosis not present

## 2019-06-06 NOTE — Patient Outreach (Signed)
[facility]) Care Management  06/06/2019  Juan Le. 1946/02/05 [id number]   Medication Adherence call to [name] HIPPA Compliant Voice message left with a call back number. Juan Le is showing past due on Telmisartan 40 mg under [place].   [place] Management Direct Dial [phone]  Fax [phone] [email or web address]

## 2019-06-07 ENCOUNTER — Ambulatory Visit (INDEPENDENT_AMBULATORY_CARE_PROVIDER_SITE_OTHER): Payer: Medicare Other | Admitting: Pharmacist

## 2019-06-07 DIAGNOSIS — J449 Chronic obstructive pulmonary disease, unspecified: Secondary | ICD-10-CM

## 2019-06-07 DIAGNOSIS — I10 Essential (primary) hypertension: Secondary | ICD-10-CM

## 2019-06-07 DIAGNOSIS — I252 Old myocardial infarction: Secondary | ICD-10-CM | POA: Diagnosis not present

## 2019-06-07 NOTE — Chronic Care Management (AMB) (Signed)
  Chronic Care Management   Follow Up Note   06/07/2019 Name: Juan Le. MRN: [id number] DOB: 1946-02-14  Subjective Juan Le Juan Le. is a 73 y.o. year old male who is a primary care patient of Bacigalupo, Dionne Bucy, MD. The CCM pharmacist was consulted for assistance with chronic disease management and care coordination needs by patient's health plan  Review of patient status, including review of consultants reports, relevant laboratory and other test results, and collaboration with appropriate care team members and the patient's provider was performed as part of comprehensive patient evaluation and provision of chronic care management services.  .   Outpatient Encounter Medications as of 06/07/2019  Medication Sig Note  . aspirin 81 MG tablet Take 1 tablet by mouth daily.   Marland Kitchen atorvastatin (LIPITOR) 40 MG tablet Take 1 tablet (40 mg total) by mouth daily at 6 PM.   . Blood Glucose Monitoring Suppl (ONETOUCH VERIO FLEX SYSTEM) w/Device KIT USE UP TO 4 TIMES DAILY AS  DIRECTED   . cloNIDine (CATAPRES) 0.1 MG tablet Take 1 tablet (0.1 mg total) by mouth 2 (two) times daily.   Marland Kitchen gabapentin (NEURONTIN) 400 MG capsule Take 2 capsules (800 mg total) by mouth 3 (three) times daily. 06/07/2019: Sometimes BID  . glucose blood test strip Use as instructed to test blood sugar 2 times daily   . Lancet Devices (MICROLET NEXT LANCING DEVICE) MISC USE DAILY   . metFORMIN (GLUCOPHAGE-XR) 750 MG 24 hr tablet Take 1 tablet (750 mg total) by mouth 2 (two) times daily.   . Omega-3 Fatty Acids (FISH OIL) 500 MG CAPS Take 2 capsules by mouth daily.    Marland Kitchen omeprazole (PRILOSEC) 40 MG capsule Take 1 capsule (40 mg total) by mouth daily.   Glory Rosebush DELICA LANCETS 81E MISC Use as directed to check blood glucose once daily   . telmisartan (MICARDIS) 40 MG tablet Take 1 tablet (40 mg total) by mouth daily.   . traZODone (DESYREL) 100 MG tablet Take 2 tablets (200 mg total) by mouth at bedtime. 06/07/2019:  Still having problems staying asleep  . umeclidinium-vilanterol (ANORO ELLIPTA) 62.5-25 MCG/INH AEPB Inhale 1 puff into the lungs daily. (Patient not taking: Reported on 06/07/2019)   . [DISCONTINUED] tamsulosin (FLOMAX) 0.4 MG CAPS capsule Take 1 capsule (0.4 mg total) by mouth daily. (Patient not taking: Reported on 03/08/2019)    No facility-administered encounter medications on file as of 06/07/2019.      Goals Addressed            This Visit's Progress   . Medication Adherence (pt-stated)        Current Barriers:  . Sometimes forgets to take medications  Pharmacist Clinical Goal(s): Over the next 90 days, Mr. Cecchi will be adherent to all medications as evidenced by pharmacy fill history.   Interventions: . Patient educated on purpose, proper use and potential adverse effects of telmisartan and clonidine . Counseled on the importance of taking clonidine and telmisartan daily, especially clonidine due to rebound high blood pressure . Education on signs and symptoms of high blood pressure, stroke (FAST)  Patient Self Care Activities:  . Take all medication as prescribed  Initial goal documentation         Telephone follow up appointment with care management team member scheduled for: 90 days   Ruben Reason, PharmD Clinical Pharmacist [place] [phone]

## 2019-06-07 NOTE — Patient Instructions (Signed)
  Thank you allowing the Chronic Care Management Team to be a part of your care!   Please call a member of the CCM (Chronic Care Management) Team with any questions or case management needs:   Vanetta Mulders, BSN Nurse Care Coordinator  [phone]  Ruben Reason, PharmD  Clinical Pharmacist  [phone]  Elliot Gurney, LCSW Clinical Social Worker [phone]  Goals Addressed            This Visit's Progress   . Medication Adherence (pt-stated)        Current Barriers:  . Sometimes forgets to take medications  Pharmacist Clinical Goal(s): Over the next 90 days, Mr. Reisig will be adherent to all medications as evidenced by pharmacy fill history.   Interventions: . Patient educated on purpose, proper use and potential adverse effects of telmisartan and clonidine . Counseled on the importance of taking clonidine and telmisartan daily, especially clonidine due to rebound high blood pressure . Education on signs and symptoms of high blood pressure, stroke (FAST)  Patient Self Care Activities:  . Take all medication as prescribed  Initial goal documentation        The patient verbalized understanding of instructions provided today and declined a print copy of patient instruction materials.

## 2019-06-11 ENCOUNTER — Encounter: Payer: Self-pay | Admitting: Family Medicine

## 2019-06-11 ENCOUNTER — Ambulatory Visit (INDEPENDENT_AMBULATORY_CARE_PROVIDER_SITE_OTHER): Payer: Medicare Other | Admitting: Family Medicine

## 2019-06-11 ENCOUNTER — Other Ambulatory Visit: Payer: Self-pay

## 2019-06-11 VITALS — BP 138/72 | HR 71 | Temp 97.3°F | Wt 220.4 lb

## 2019-06-11 DIAGNOSIS — I10 Essential (primary) hypertension: Secondary | ICD-10-CM

## 2019-06-11 DIAGNOSIS — J449 Chronic obstructive pulmonary disease, unspecified: Secondary | ICD-10-CM | POA: Diagnosis not present

## 2019-06-11 DIAGNOSIS — Z6831 Body mass index (BMI) 31.0-31.9, adult: Secondary | ICD-10-CM

## 2019-06-11 DIAGNOSIS — E1169 Type 2 diabetes mellitus with other specified complication: Secondary | ICD-10-CM

## 2019-06-11 DIAGNOSIS — E669 Obesity, unspecified: Secondary | ICD-10-CM

## 2019-06-11 DIAGNOSIS — Z23 Encounter for immunization: Secondary | ICD-10-CM | POA: Diagnosis not present

## 2019-06-11 DIAGNOSIS — G4733 Obstructive sleep apnea (adult) (pediatric): Secondary | ICD-10-CM | POA: Diagnosis not present

## 2019-06-11 LAB — POCT GLYCOSYLATED HEMOGLOBIN (HGB A1C)
Est. average glucose Bld gHb Est-mCnc: 128
Hemoglobin A1C: 6.1 % — AB (ref 4.0–5.6)

## 2019-06-11 NOTE — Assessment & Plan Note (Signed)
Chronic and well-controlled Continue Anoro

## 2019-06-11 NOTE — Assessment & Plan Note (Signed)
Well controlled Continue current medications UTD on vaccines, eye exam foot exam completed today On ARB On Statin Discussed diet and exercise F/u in 6 months

## 2019-06-11 NOTE — Assessment & Plan Note (Signed)
Uncontrolled With nonrestorative sleep and nighttime awakenings as well as snoring He was not able to tolerate CPAP and does not want to try BiPAP Discussed potential health risks of uncontrolled sleep apnea

## 2019-06-11 NOTE — Progress Notes (Signed)
Patient: Juan Le. Male    DOB: 1945-12-31   73 y.o.   MRN: [id number] Visit Date: 06/11/2019  Today's Provider: [name], MD   Chief Complaint  Patient presents with  . Diabetes   Subjective:    I, Porsha McClurkin CMA, am acting as a scribe for [name], MD.   HPI  Diabetes Mellitus Type II, Follow-up:   Lab Results  Component Value Date   HGBA1C 6.2 (A) 03/09/2019   HGBA1C 7.0 (A) 10/24/2018   HGBA1C 6.0 (A) 06/12/2018    Last seen for diabetes 3 months ago.  Management since then includes no change. He reports good compliance with treatment. He is not having side effects.  Current symptoms include none and have been stable. Home blood sugar records: Fasting BS was 122 today 06/11/2019  Episodes of hypoglycemia? no   Current insulin regiment: Is not on insulin Most Recent Eye Exam: 11/2018 Weight trend: stable Prior visit with dietician: No Current exercise: none Current diet habits: well balanced  Pertinent Labs:    Component Value Date/Time   CHOL 95 (L) 12/13/2018 1036   TRIG 140 12/13/2018 1036   HDL 26 (L) 12/13/2018 1036   LDLCALC 41 12/13/2018 1036   LDLCALC 68 11/29/2017 0936   CREATININE 1.04 05/24/2019 0823   CREATININE 1.03 11/29/2017 0936    Wt Readings from Last 3 Encounters:  06/11/19 220 lb 6.4 oz (100 kg)  12/13/18 223 lb 12.8 oz (101.5 kg)  12/13/18 223 lb 12.8 oz (101.5 kg)    ------------------------------------------------------------------------ Dizziness has resolved. Thought to be inner ear. Followed by Dr Pryor Ochoa  COPD well controlled. No rescue inhaler use     Allergies  Allergen Reactions  . Ace Inhibitors Cough  . Diphenhydramine     stomach upset     Current Outpatient Medications:  .  aspirin 81 MG tablet, Take 1 tablet by mouth daily., Disp: , Rfl:  .  atorvastatin (LIPITOR) 40 MG tablet, Take 1 tablet (40 mg total) by mouth daily at 6 PM., Disp: 90 tablet, Rfl: 3 .  Blood  Glucose Monitoring Suppl (ONETOUCH VERIO FLEX SYSTEM) w/Device KIT, USE UP TO 4 TIMES DAILY AS  DIRECTED, Disp: 1 kit, Rfl: 0 .  cloNIDine (CATAPRES) 0.1 MG tablet, Take 1 tablet (0.1 mg total) by mouth 2 (two) times daily., Disp: 180 tablet, Rfl: 3 .  gabapentin (NEURONTIN) 400 MG capsule, Take 2 capsules (800 mg total) by mouth 3 (three) times daily., Disp: 540 capsule, Rfl: 3 .  glucose blood test strip, Use as instructed to test blood sugar 2 times daily, Disp: 300 each, Rfl: 3 .  Lancet Devices (MICROLET NEXT LANCING DEVICE) MISC, USE DAILY, Disp: 1 each, Rfl: 0 .  metFORMIN (GLUCOPHAGE-XR) 750 MG 24 hr tablet, Take 1 tablet (750 mg total) by mouth 2 (two) times daily., Disp: 180 tablet, Rfl: 3 .  Omega-3 Fatty Acids (FISH OIL) 500 MG CAPS, Take 2 capsules by mouth daily. , Disp: , Rfl:  .  omeprazole (PRILOSEC) 40 MG capsule, Take 1 capsule (40 mg total) by mouth daily., Disp: 90 capsule, Rfl: 3 .  ONETOUCH DELICA LANCETS 53Z MISC, Use as directed to check blood glucose once daily, Disp: 100 each, Rfl: 3 .  telmisartan (MICARDIS) 40 MG tablet, Take 1 tablet (40 mg total) by mouth daily., Disp: 90 tablet, Rfl: 1 .  traZODone (DESYREL) 100 MG tablet, Take 2 tablets (200 mg total) by mouth at bedtime., Disp: 180  tablet, Rfl: 1 .  umeclidinium-vilanterol (ANORO ELLIPTA) 62.5-25 MCG/INH AEPB, Inhale 1 puff into the lungs daily., Disp: 90 each, Rfl: 3  Review of Systems  Constitutional: Negative.   Respiratory: Negative.   Cardiovascular: Negative.   Neurological: Negative.     Social History   Tobacco Use  . Smoking status: Former Smoker    Types: Cigarettes  . Smokeless tobacco: Never Used  . Tobacco comment: former light smoker; quit in his teens  Substance Use Topics  . Alcohol use: No    Alcohol/week: 0.0 standard drinks      Objective:   BP 138/72 (BP Location: Left Arm, Patient Position: Sitting, Cuff Size: Normal)   Pulse 71   Temp (!) 97.3 F (36.3 C) (Temporal)   Wt  220 lb 6.4 oz (100 kg)   SpO2 96%   BMI 31.62 kg/m  Vitals:   06/11/19 0943  BP: 138/72  Pulse: 71  Temp: (!) 97.3 F (36.3 C)  TempSrc: Temporal  SpO2: 96%  Weight: 220 lb 6.4 oz (100 kg)     Physical Exam Vitals signs reviewed.  Constitutional:      General: He is not in acute distress.    Appearance: Normal appearance. He is not diaphoretic.  HENT:     Head: Normocephalic and atraumatic.  Eyes:     General: No scleral icterus.    Conjunctiva/sclera: Conjunctivae normal.  Neck:     Musculoskeletal: Neck supple.  Cardiovascular:     Rate and Rhythm: Normal rate and regular rhythm.     Pulses: Normal pulses.     Heart sounds: Normal heart sounds. No murmur.  Pulmonary:     Effort: Pulmonary effort is normal. No respiratory distress.     Breath sounds: Normal breath sounds. No wheezing or rhonchi.  Abdominal:     General: There is no distension.     Palpations: Abdomen is soft.     Tenderness: There is no abdominal tenderness.  Musculoskeletal:     Right lower leg: No edema.     Left lower leg: No edema.  Lymphadenopathy:     Cervical: No cervical adenopathy.  Skin:    General: Skin is warm and dry.     Capillary Refill: Capillary refill takes less than 2 seconds.     Findings: No rash.  Neurological:     Mental Status: He is alert and oriented to person, place, and time. Mental status is at baseline.     Cranial Nerves: No cranial nerve deficit.  Psychiatric:        Mood and Affect: Mood normal.        Behavior: Behavior normal.     Diabetic Foot Exam - Simple   Simple Foot Form Diabetic Foot exam was performed with the following findings: Yes 06/11/2019  9:54 AM  Visual Inspection No deformities, no ulcerations, no other skin breakdown bilaterally: Yes Sensation Testing Intact to touch and monofilament testing bilaterally: Yes Pulse Check Posterior Tibialis and Dorsalis pulse intact bilaterally: Yes Comments      No results found for any visits on  06/11/19.     Assessment & Plan    Problem List Items Addressed This Visit      Cardiovascular and Mediastinum   Hypertension    Well controlled Continue current medications Reviewed recent metabolic panel F/u in 6 months         Respiratory   Obstructive sleep apnea    Uncontrolled With nonrestorative sleep and nighttime awakenings as well  as snoring He was not able to tolerate CPAP and does not want to try BiPAP Discussed potential health risks of uncontrolled sleep apnea      COPD with chronic bronchitis and emphysema (HCC)    Chronic and well-controlled Continue Anoro        Endocrine   T2DM (type 2 diabetes mellitus) ([place]) - Primary    Well controlled Continue current medications UTD on vaccines, eye exam foot exam completed today On ARB On Statin Discussed diet and exercise F/u in 6 months       Relevant Orders   POCT glycosylated hemoglobin (Hb A1C) (Completed)     Other   Class 1 obesity with serious comorbidity and body mass index (BMI) of 31.0 to 31.9 in adult    Discussed importance of healthy weight management Discussed diet and exercise           Return in about 6 months (around 12/12/2019) for CPE/AWV.   The entirety of the information documented in the History of Present Illness, Review of Systems and Physical Exam were personally obtained by me. Portions of this information were initially documented by [facility], CMA and reviewed by me for thoroughness and accuracy.    Bacigalupo, Dionne Bucy, MD MPH [facility]

## 2019-06-11 NOTE — Assessment & Plan Note (Signed)
Well controlled Continue current medications Reviewed recent metabolic panel F/u in 6 months  

## 2019-06-11 NOTE — Assessment & Plan Note (Signed)
Discussed importance of healthy weight management Discussed diet and exercise  

## 2019-06-26 ENCOUNTER — Other Ambulatory Visit: Payer: Self-pay | Admitting: Family Medicine

## 2019-06-26 DIAGNOSIS — E119 Type 2 diabetes mellitus without complications: Secondary | ICD-10-CM

## 2019-06-26 DIAGNOSIS — I10 Essential (primary) hypertension: Secondary | ICD-10-CM

## 2019-08-01 ENCOUNTER — Other Ambulatory Visit: Payer: Self-pay

## 2019-08-01 ENCOUNTER — Encounter: Payer: Self-pay | Admitting: Family Medicine

## 2019-08-01 ENCOUNTER — Ambulatory Visit (INDEPENDENT_AMBULATORY_CARE_PROVIDER_SITE_OTHER): Payer: Medicare Other | Admitting: Family Medicine

## 2019-08-01 DIAGNOSIS — E1169 Type 2 diabetes mellitus with other specified complication: Secondary | ICD-10-CM

## 2019-08-01 NOTE — Progress Notes (Signed)
Patient: Juan Le. Male    DOB: 08/30/1946   73 y.o.   MRN: [id number] Visit Date: 08/01/2019  Today's Provider: [name], MD   Chief Complaint  Patient presents with  . Diabetes   Subjective:     HPI Patient here today to discuss metformin being recalled. Patient reports that he is still taking medication. Patient reports good tolerance and compliance.   He was told by a nurse that comes to his house for an annual wellness visit from his insurance company yesterday that metformin XR had been recalled.  He has not been notified by his pharmacy as far as he knows.   Allergies  Allergen Reactions  . Ace Inhibitors Cough  . Diphenhydramine     stomach upset     Current Outpatient Medications:  .  aspirin 81 MG tablet, Take 1 tablet by mouth daily., Disp: , Rfl:  .  atorvastatin (LIPITOR) 40 MG tablet, Take 1 tablet (40 mg total) by mouth daily at 6 PM., Disp: 90 tablet, Rfl: 3 .  Blood Glucose Monitoring Suppl (ONETOUCH VERIO FLEX SYSTEM) w/Device KIT, USE UP TO 4 TIMES DAILY AS  DIRECTED, Disp: 1 kit, Rfl: 0 .  cloNIDine (CATAPRES) 0.1 MG tablet, TAKE 1 TABLET BY MOUTH TWO  TIMES DAILY, Disp: 180 tablet, Rfl: 3 .  gabapentin (NEURONTIN) 400 MG capsule, Take 2 capsules (800 mg total) by mouth 3 (three) times daily., Disp: 540 capsule, Rfl: 3 .  glucose blood test strip, Use as instructed to test blood sugar 2 times daily, Disp: 300 each, Rfl: 3 .  Lancet Devices (MICROLET NEXT LANCING DEVICE) MISC, USE DAILY, Disp: 1 each, Rfl: 0 .  metFORMIN (GLUCOPHAGE-XR) 750 MG 24 hr tablet, Take 1 tablet (750 mg total) by mouth 2 (two) times daily., Disp: 180 tablet, Rfl: 3 .  Omega-3 Fatty Acids (FISH OIL) 500 MG CAPS, Take 2 capsules by mouth daily. , Disp: , Rfl:  .  omeprazole (PRILOSEC) 40 MG capsule, Take 1 capsule (40 mg total) by mouth daily., Disp: 90 capsule, Rfl: 3 .  ONETOUCH DELICA LANCETS 48O MISC, Use as directed to check blood glucose once daily, Disp:  100 each, Rfl: 3 .  telmisartan (MICARDIS) 40 MG tablet, TAKE 1 TABLET BY MOUTH  DAILY, Disp: 90 tablet, Rfl: 3 .  traZODone (DESYREL) 100 MG tablet, Take 2 tablets (200 mg total) by mouth at bedtime., Disp: 180 tablet, Rfl: 1 .  umeclidinium-vilanterol (ANORO ELLIPTA) 62.5-25 MCG/INH AEPB, Inhale 1 puff into the lungs daily., Disp: 90 each, Rfl: 3  Review of Systems  Constitutional: Negative.   Eyes: Negative.   Cardiovascular: Negative.   Endocrine: Negative.     Social History   Tobacco Use  . Smoking status: Former Smoker    Types: Cigarettes  . Smokeless tobacco: Never Used  . Tobacco comment: former light smoker; quit in his teens  Substance Use Topics  . Alcohol use: No    Alcohol/week: 0.0 standard drinks      Objective:   BP 129/81 (BP Location: Left Arm, Patient Position: Sitting, Cuff Size: Large)   Pulse (!) 56   Temp (!) 97.3 F (36.3 C) (Temporal)   Resp 16   Ht _0  (1.753 m)   Wt 221 lb 3.2 oz (100.3 kg)   SpO2 97%   BMI 32.67 kg/m  Vitals:   08/01/19 1557  BP: 129/81  Pulse: (!) 56  Resp: 16  Temp: (!) 97.3 F (36.3  C)  TempSrc: Temporal  SpO2: 97%  Weight: 221 lb 3.2 oz (100.3 kg)  Height: _0  (1.753 m)  Body mass index is 32.67 kg/m.   Physical Exam Vitals signs reviewed.  Constitutional:      General: He is not in acute distress.    Appearance: Normal appearance.  HENT:     Head: Normocephalic and atraumatic.  Cardiovascular:     Rate and Rhythm: Normal rate and regular rhythm.  Pulmonary:     Effort: Pulmonary effort is normal. No respiratory distress.  Neurological:     Mental Status: He is alert and oriented to person, place, and time. Mental status is at baseline.  Psychiatric:        Mood and Affect: Mood normal.        Behavior: Behavior normal.      No results found for any visits on 08/01/19.     Assessment & Plan   Problem List Items Addressed This Visit      Endocrine   T2DM (type 2 diabetes mellitus)  ([place])    Previously well controlled Complicated by hypertension and hyperlipidemia Is scheduled to follow-up in about 4 months No changes to medications today I spent more than 25 minutes discussing the recall with the patient and with his pharmacy in the room with him It appears that the recent prescription that was mailed out to him has been recalled The pharmacy states that they will rush a new batch to him that has not been recalled and he can return the recalled medication to them Patient expresses understanding of this plan and will await the refill in the mail from [facility]         Approximately 25 minutes was spent in discussion of which greater than 50% was consultation.     The entirety of the information documented in the History of Present Illness, Review of Systems and Physical Exam were personally obtained by me. Portions of this information were initially documented by Lynford Humphrey, CMA and reviewed by me for thoroughness and accuracy.    Bricia Taher, Dionne Bucy, MD MPH [facility]

## 2019-08-01 NOTE — Assessment & Plan Note (Addendum)
Previously well controlled Complicated by hypertension and hyperlipidemia Is scheduled to follow-up in about 4 months No changes to medications today I spent more than 25 minutes discussing the recall with the patient and with his pharmacy in the room with him It appears that the recent prescription that was mailed out to him has been recalled The pharmacy states that they will rush a new batch to him that has not been recalled and he can return the recalled medication to them Patient expresses understanding of this plan and will await the refill in the mail from [facility]

## 2019-08-03 ENCOUNTER — Other Ambulatory Visit: Payer: Self-pay | Admitting: Family Medicine

## 2019-08-03 DIAGNOSIS — E119 Type 2 diabetes mellitus without complications: Secondary | ICD-10-CM

## 2019-08-03 DIAGNOSIS — E785 Hyperlipidemia, unspecified: Secondary | ICD-10-CM

## 2019-08-03 DIAGNOSIS — M792 Neuralgia and neuritis, unspecified: Secondary | ICD-10-CM

## 2019-08-03 DIAGNOSIS — F5105 Insomnia due to other mental disorder: Secondary | ICD-10-CM

## 2019-09-06 ENCOUNTER — Telehealth: Payer: Self-pay

## 2019-09-10 ENCOUNTER — Other Ambulatory Visit: Payer: Self-pay | Admitting: Family Medicine

## 2019-09-10 ENCOUNTER — Telehealth: Payer: Self-pay

## 2019-09-10 ENCOUNTER — Ambulatory Visit: Payer: Medicare Other | Admitting: Urology

## 2019-09-10 DIAGNOSIS — K219 Gastro-esophageal reflux disease without esophagitis: Secondary | ICD-10-CM

## 2019-09-11 ENCOUNTER — Ambulatory Visit: Payer: Medicare HMO | Admitting: Urology

## 2019-09-17 ENCOUNTER — Telehealth: Payer: Self-pay

## 2019-10-03 IMAGING — CT CT ABD-PEL WO/W CM
3 of 12 series · 12 of 46 positions shown, 18 images · IV contrast (APPLIED)
Comparison: CT 03/30/2018

CLINICAL DATA: Urinary frequency. Hematuria. Microscopic hematuria.

EXAM:
CT ABDOMEN AND PELVIS WITHOUT AND WITH CONTRAST
TECHNIQUE: Multidetector CT imaging of the abdomen and pelvis was performed
following the standard protocol before and following the bolus
administration of intravenous contrast.
CONTRAST:  125mL NRL88N-HJJ IOPAMIDOL (NRL88N-HJJ) INJECTION 61%

[Series 2: axial pre · axial · non-contrast · 0.84mm/px · z∈[-550,-165]mm · 7 of 103 slices shown, 12 images]
[im 13/103  soft-tissue]
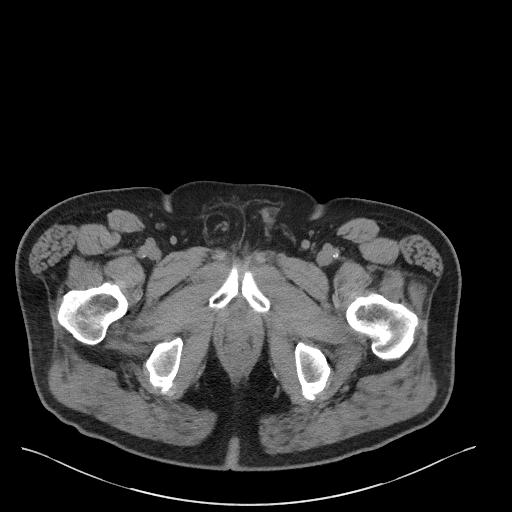
[im 13/103  bone]
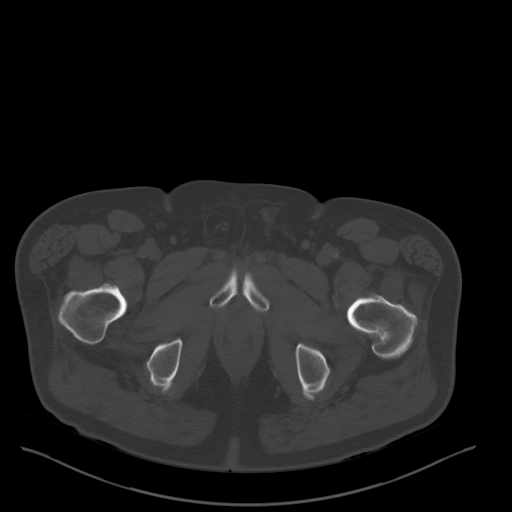
[im 26/103  soft-tissue]
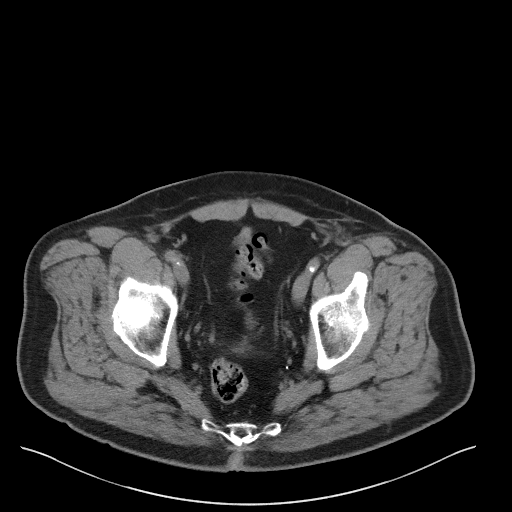
[im 39/103  soft-tissue]
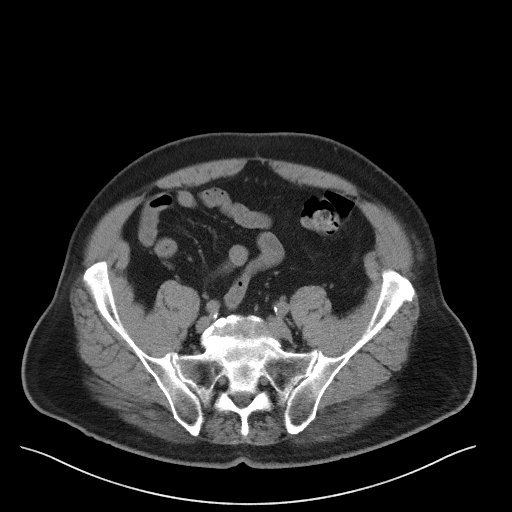
[im 52/103  soft-tissue]
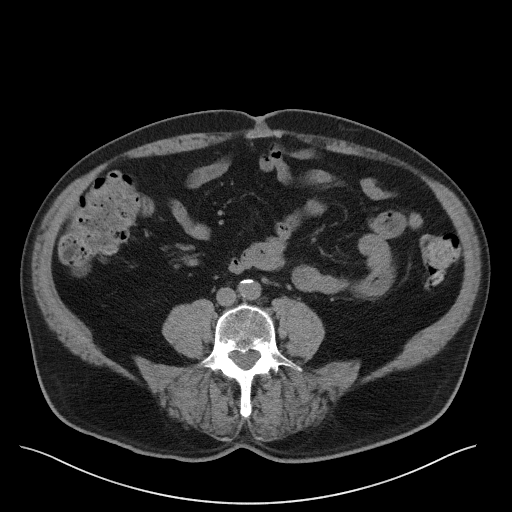
[im 52/103  lung]
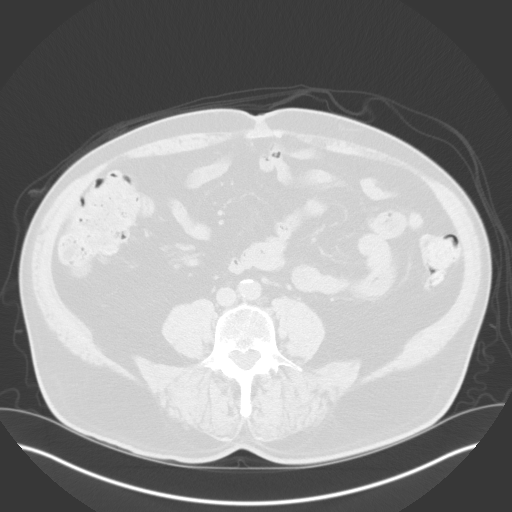
[im 64/103  soft-tissue]
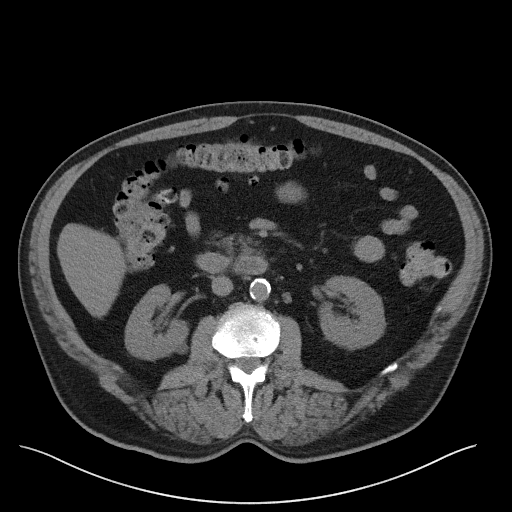
[im 64/103  lung]
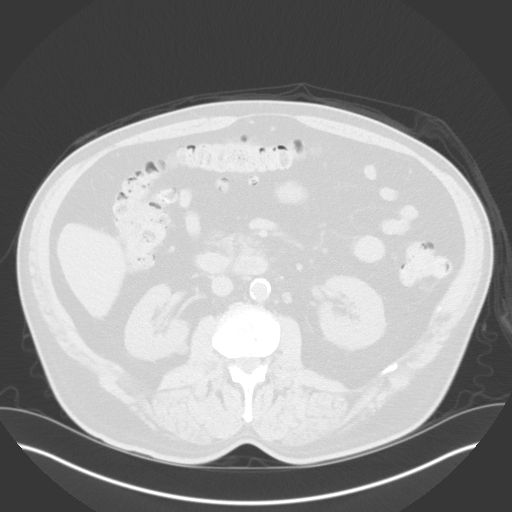
[im 77/103  soft-tissue]
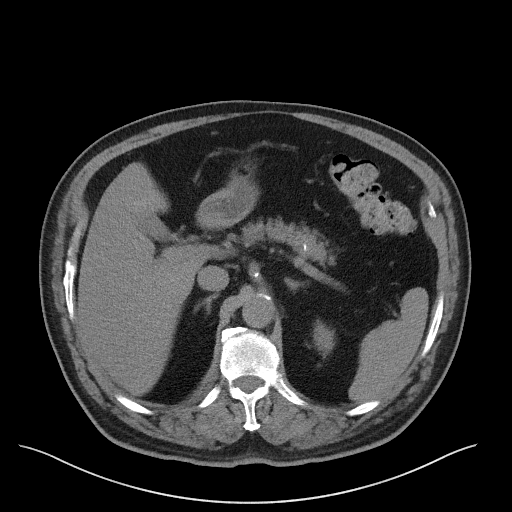
[im 77/103  lung]
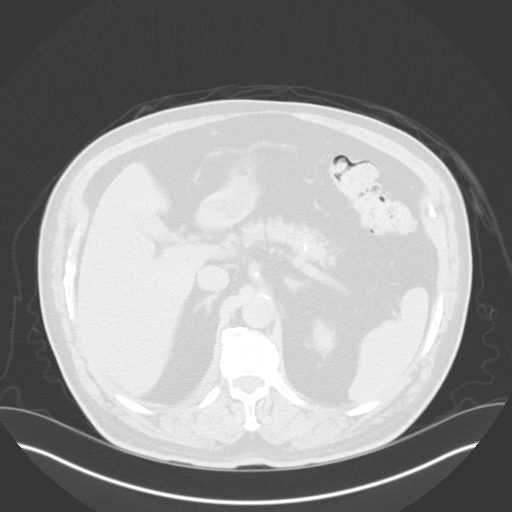
[im 90/103  soft-tissue]
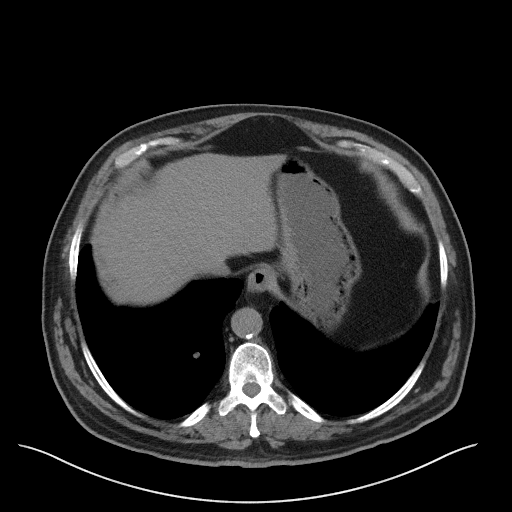
[im 90/103  lung]
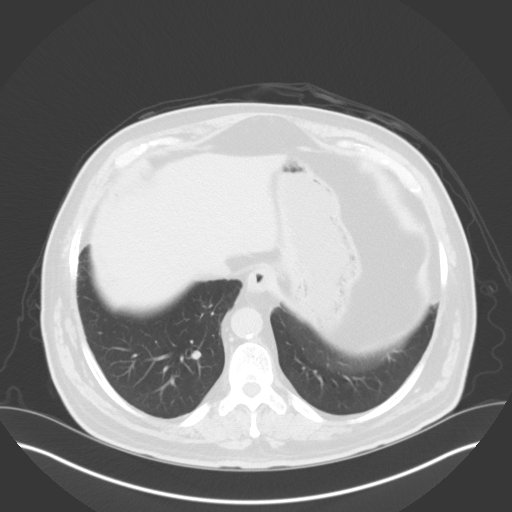

[Series 4: coronal pre · coronal · non-contrast · 0.79mm/px · 2 of 107 slices shown, 3 images]
[im 36/107  soft-tissue]
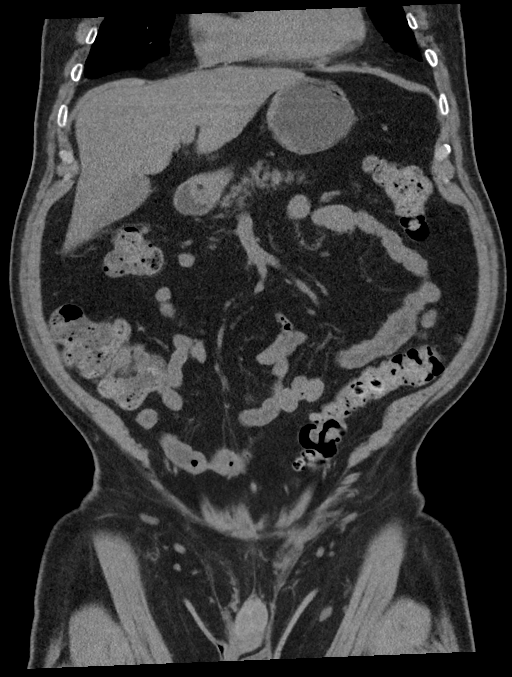
[im 36/107  bone]
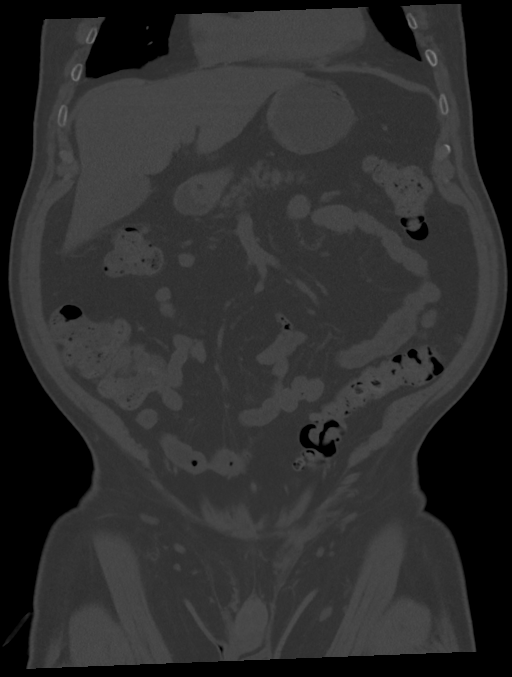
[im 71/107  soft-tissue]
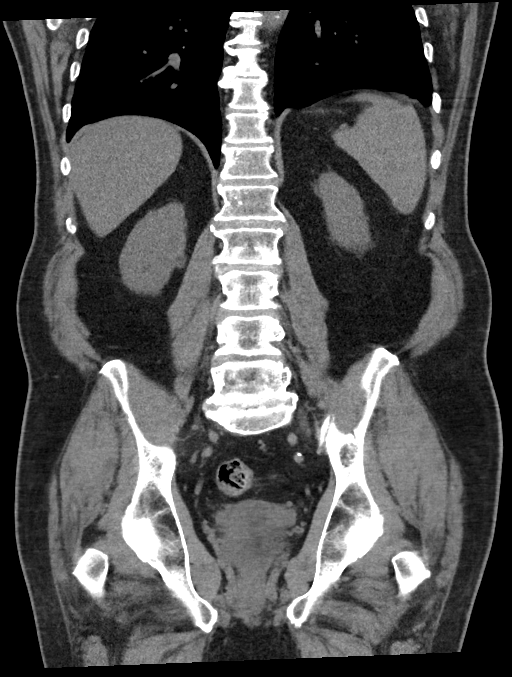

[Series 13: axial delay · axial · delayed · 0.93mm/px · z∈[-565,-415]mm · 3 of 106 slices shown]
[im 16/106  soft-tissue]
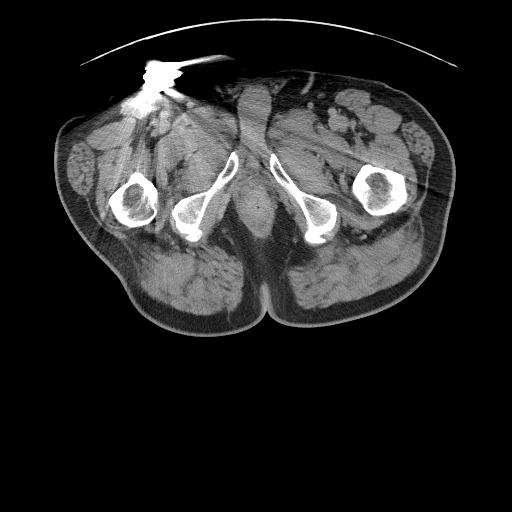
[im 31/106  soft-tissue]
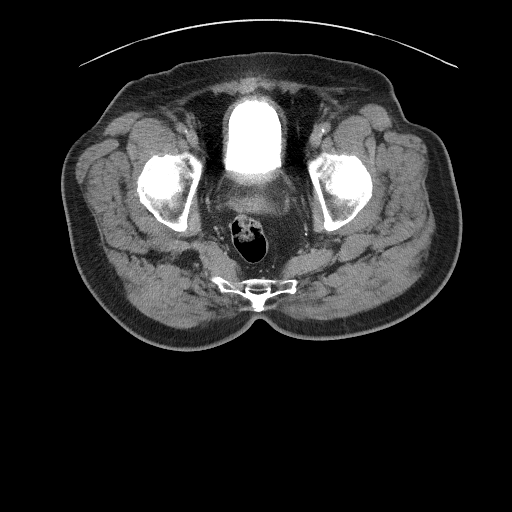
[im 46/106  soft-tissue]
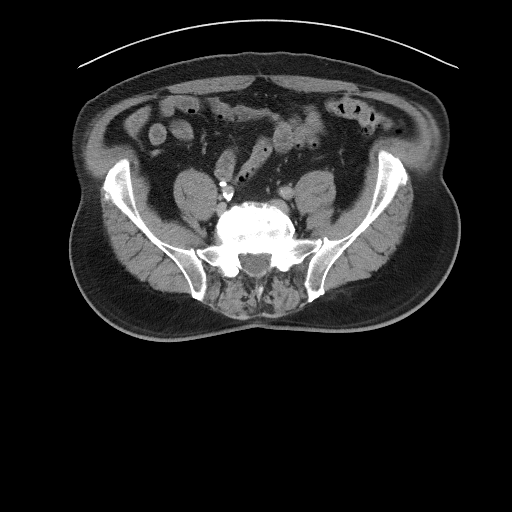

[12 of 46 positions shown; findings below may reference images not displayed]

FINDINGS: Lower chest: Lung bases are clear.

Hepatobiliary: No focal hepatic lesion. No biliary duct dilatation.
Gallbladder is normal. Common bile duct is normal.

Pancreas: Pancreas is normal. No ductal dilatation. No pancreatic
inflammation.

Spleen: Normal spleen

Adrenals/urinary tract: Adrenal glands normal. No nephrolithiasis or
ureterolithiasis.

Low-density cysts in the mid LEFT kidney measures 3.3 cm.
Low-density nonenhancing cyst lower pole of the RIGHT kidney
measures 12 mm. No enhancing renal cortical lesion. Delayed imaging
demonstrates no filling defects within collecting systems ureters.

No bladder calculi, enhancing bladder lesions, or filling defect
within the bladder.

Stomach/Bowel: Stomach, small bowel, appendix, and cecum are normal.
Scattered diverticula throughout the colon without acute
inflammation.

Vascular/Lymphatic: Abdominal aorta is normal caliber with
atherosclerotic calcification. There is no retroperitoneal or
periportal lymphadenopathy. No pelvic lymphadenopathy.

Reproductive: Prostate normal

Other: Small bilateral fat filled inguinal hernias.

Musculoskeletal: No aggressive osseous lesion.
IMPRESSION: 1. No explanation for hematuria. No nephrolithiasis,
ureterolithiasis, enhancing renal cortical lesion, or filling
defects within the collecting systems.
2. Bilateral Bosniak 1 renal cysts.
3. No bladder stones or filling defects in the bladder which does
not excluded a bladder lesion.
4.  Aortic Atherosclerosis ([id number]).

## 2019-10-27 ENCOUNTER — Other Ambulatory Visit: Payer: Self-pay | Admitting: Family Medicine

## 2019-11-25 ENCOUNTER — Other Ambulatory Visit: Payer: Self-pay | Admitting: Family Medicine

## 2019-11-25 DIAGNOSIS — E119 Type 2 diabetes mellitus without complications: Secondary | ICD-10-CM

## 2019-11-25 NOTE — Telephone Encounter (Signed)
Requested Prescriptions  Pending Prescriptions Disp Refills  . Lancet Devices ([place] NEXT LANCING DEVICE) MISC [Pharmacy Med Name: [place]   NEXT LANCING [place] 1 each 0    Sig: USE DAILY     Endocrinology: Diabetes - Testing Supplies Passed - 11/25/2019 11:46 AM      Passed - Valid encounter within last 12 months    Recent Outpatient Visits          3 months ago Type 2 diabetes mellitus with other specified complication, without long-term current use of insulin ([place])   [facility], Dionne Bucy, MD   5 months ago Type 2 diabetes mellitus with other specified complication, without long-term current use of insulin [facility])   [facility], Dionne Bucy, MD   6 months ago [place], Dionne Bucy, MD   8 months ago Type 2 diabetes mellitus with hyperglycemia, without long-term current use of insulin [facility])   [facility], Dionne Bucy, MD   11 months ago Encounter for annual physical exam   [facility] [place], Dionne Bucy, MD      Future Appointments            In 4 days Bacigalupo, Dionne Bucy, MD [facility], [place]   In 3 weeks  [facility], [place]   In 3 weeks Bacigalupo, Dionne Bucy, MD [facility], [facility]           . Blood Glucose Monitoring Suppl ([place]) w/Device KIT [Pharmacy Med Name: ONE KIT  TOUCH VERIO FLEX KIT] 1 kit 0    Sig: USE UP TO 4 TIMES DAILY AS  DIRECTED     Endocrinology: Diabetes - Testing Supplies Passed - 11/25/2019 11:46 AM      Passed - Valid encounter within last 12 months    Recent Outpatient Visits          3 months ago Type 2 diabetes mellitus with other specified complication, without long-term current use of insulin ([place])   [facility], Dionne Bucy, MD   5 months ago Type 2 diabetes mellitus with other specified complication, without long-term current use of  insulin [facility])   [facility], Dionne Bucy, MD   6 months ago [place], Dionne Bucy, MD   8 months ago Type 2 diabetes mellitus with hyperglycemia, without long-term current use of insulin [facility])   [facility], Dionne Bucy, MD   11 months ago Encounter for annual physical exam   [facility] [place], Dionne Bucy, MD      Future Appointments            In 4 days Bacigalupo, Dionne Bucy, MD [facility], [place]   In 3 weeks  [employer], [place]   In 3 weeks Bacigalupo, Dionne Bucy, MD [facility], [place]

## 2019-11-29 ENCOUNTER — Ambulatory Visit (INDEPENDENT_AMBULATORY_CARE_PROVIDER_SITE_OTHER): Payer: Medicare Other | Admitting: Family Medicine

## 2019-11-29 ENCOUNTER — Encounter: Payer: Self-pay | Admitting: Family Medicine

## 2019-11-29 ENCOUNTER — Other Ambulatory Visit: Payer: Self-pay

## 2019-11-29 VITALS — BP 134/68 | HR 56 | Temp 97.3°F | Wt 220.0 lb

## 2019-11-29 DIAGNOSIS — J449 Chronic obstructive pulmonary disease, unspecified: Secondary | ICD-10-CM | POA: Diagnosis not present

## 2019-11-29 DIAGNOSIS — E1169 Type 2 diabetes mellitus with other specified complication: Secondary | ICD-10-CM | POA: Diagnosis not present

## 2019-11-29 DIAGNOSIS — Z6832 Body mass index (BMI) 32.0-32.9, adult: Secondary | ICD-10-CM

## 2019-11-29 DIAGNOSIS — G4733 Obstructive sleep apnea (adult) (pediatric): Secondary | ICD-10-CM

## 2019-11-29 DIAGNOSIS — I10 Essential (primary) hypertension: Secondary | ICD-10-CM | POA: Diagnosis not present

## 2019-11-29 DIAGNOSIS — E785 Hyperlipidemia, unspecified: Secondary | ICD-10-CM

## 2019-11-29 DIAGNOSIS — E669 Obesity, unspecified: Secondary | ICD-10-CM | POA: Diagnosis not present

## 2019-11-29 DIAGNOSIS — E1142 Type 2 diabetes mellitus with diabetic polyneuropathy: Secondary | ICD-10-CM

## 2019-11-29 LAB — POCT GLYCOSYLATED HEMOGLOBIN (HGB A1C): Hemoglobin A1C: 5.8 % — AB (ref 4.0–5.6)

## 2019-11-29 NOTE — Assessment & Plan Note (Signed)
Well controlled Continue current medications Recheck metabolic panel F/u in 6 months  

## 2019-11-29 NOTE — Assessment & Plan Note (Signed)
Discussed importance of healthy weight management Discussed diet and exercise  

## 2019-11-29 NOTE — Assessment & Plan Note (Signed)
Previously well controlled Continue lipitor Recheck CMP and FLP

## 2019-11-29 NOTE — Assessment & Plan Note (Signed)
Chronic and well-controlled Continue gabapentin at current dose 

## 2019-11-29 NOTE — Progress Notes (Signed)
Patient: Juan Le. Male    DOB: 1945/11/07   74 y.o.   MRN: [id number] Visit Date: 11/29/2019  Today's Provider: [name], MD   Chief Complaint  Patient presents with  . Hypertension  . Hyperlipidemia  . Diabetes   Subjective:     HPI    Diabetes Mellitus Type II, Follow-up:   Lab Results  Component Value Date   HGBA1C 5.8 (A) 11/29/2019   HGBA1C 6.1 (A) 06/11/2019   HGBA1C 6.2 (A) 03/09/2019   Last seen for diabetes 4 months ago.  Management since then includes No changes. He reports excellent compliance with treatment. He is not having side effects.  Current symptoms include paresthesia of the feet and have been worsening. Home blood sugar records: fasting range: 120's  Episodes of hypoglycemia? no   Current Insulin Regimen:  Most Recent Eye Exam: 11/21/2018 Weight trend: stable Current diet: in general, a "healthy" diet   Current exercise: none  ------------------------------------------------------------------------   Hypertension, follow-up:  BP Readings from Last 3 Encounters:  11/29/19 134/68  08/01/19 129/81  06/11/19 138/72    He was last seen for hypertension 4 months ago.  BP at that visit was 129/81. Management since that visit includes no changes .He reports excellent compliance with treatment. He is not having side effects.  He is not exercising. He is adherent to low salt diet.   Outside blood pressures are normal at home. He is experiencing none.  Patient denies chest pain, lower extremity edema, palpitations and syncope.   Cardiovascular risk factors include advanced age (older than 20 for men, 68 for women), diabetes mellitus, dyslipidemia, hypertension and male gender.  Use of agents associated with hypertension: none.   ------------------------------------------------------------------------    Lipid/Cholesterol, Follow-up:   Last seen for this 6 months ago.  Management since that visit includes no  changes.  Last Lipid Panel:    Component Value Date/Time   CHOL 95 (L) 12/13/2018 1036   TRIG 140 12/13/2018 1036   HDL 26 (L) 12/13/2018 1036   CHOLHDL 3.7 12/13/2018 1036   CHOLHDL 3.9 11/29/2017 0936   VLDL 42 (H) 04/21/2017 0852   LDLCALC 41 12/13/2018 1036   LDLCALC 68 11/29/2017 0936    He reports excellent compliance with treatment. He is not having side effects.   Wt Readings from Last 3 Encounters:  11/29/19 220 lb (99.8 kg)  08/01/19 221 lb 3.2 oz (100.3 kg)  06/11/19 220 lb 6.4 oz (100 kg)    ------------------------------------------------------------------------    Allergies  Allergen Reactions  . Ace Inhibitors Cough  . Diphenhydramine     stomach upset     Current Outpatient Medications:  .  aspirin 81 MG tablet, Take 1 tablet by mouth daily., Disp: , Rfl:  .  atorvastatin (LIPITOR) 40 MG tablet, TAKE 1 TABLET BY MOUTH  DAILY AT 6 PM, Disp: 90 tablet, Rfl: 3 .  Blood Glucose Monitoring Suppl (ONETOUCH VERIO FLEX SYSTEM) w/Device KIT, USE UP TO 4 TIMES DAILY AS  DIRECTED, Disp: 1 kit, Rfl: 0 .  cloNIDine (CATAPRES) 0.1 MG tablet, TAKE 1 TABLET BY MOUTH TWO  TIMES DAILY, Disp: 180 tablet, Rfl: 3 .  gabapentin (NEURONTIN) 400 MG capsule, TAKE 2 CAPSULES BY MOUTH 3  TIMES DAILY, Disp: 540 capsule, Rfl: 3 .  glucose blood test strip, Use as instructed to test blood sugar 2 times daily, Disp: 300 each, Rfl: 3 .  Lancet Devices (MICROLET NEXT LANCING DEVICE) MISC, USE DAILY,  Disp: 1 each, Rfl: 0 .  Lancets (ONETOUCH DELICA PLUS [id number]) MISC, USE AS DIRECTED TO CHECK  BLOOD GLUCOSE ONCE DAILY, Disp: 100 each, Rfl: 3 .  Omega-3 Fatty Acids (FISH OIL) 500 MG CAPS, Take 2 capsules by mouth daily. , Disp: , Rfl:  .  omeprazole (PRILOSEC) 40 MG capsule, TAKE 1 CAPSULE BY MOUTH  DAILY, Disp: 90 capsule, Rfl: 3 .  telmisartan (MICARDIS) 40 MG tablet, TAKE 1 TABLET BY MOUTH  DAILY, Disp: 90 tablet, Rfl: 3 .  traZODone (DESYREL) 100 MG tablet, TAKE 2 TABLETS BY MOUTH  AT  BEDTIME, Disp: 180 tablet, Rfl: 3  Review of Systems  Constitutional: Negative.   Gastrointestinal: Negative.   Endocrine: Negative.   Skin: Negative for wound.  Neurological: Positive for numbness (In both feet). Negative for dizziness, light-headedness and headaches.    Social History   Tobacco Use  . Smoking status: Former Smoker    Types: Cigarettes  . Smokeless tobacco: Never Used  . Tobacco comment: former light smoker; quit in his teens  Substance Use Topics  . Alcohol use: No    Alcohol/week: 0.0 standard drinks      Objective:   BP 134/68 (BP Location: Left Arm, Patient Position: Sitting, Cuff Size: Large)   Pulse (!) 56   Temp (!) 97.3 F (36.3 C) (Temporal)   Wt 220 lb (99.8 kg)   BMI 32.49 kg/m  Vitals:   11/29/19 0846  BP: 134/68  Pulse: (!) 56  Temp: (!) 97.3 F (36.3 C)  TempSrc: Temporal  Weight: 220 lb (99.8 kg)  Body mass index is 32.49 kg/m.   Physical Exam Vitals reviewed.  Constitutional:      General: He is not in acute distress.    Appearance: Normal appearance. He is not diaphoretic.  HENT:     Head: Normocephalic and atraumatic.  Eyes:     General: No scleral icterus.    Conjunctiva/sclera: Conjunctivae normal.  Cardiovascular:     Rate and Rhythm: Normal rate and regular rhythm.     Pulses: Normal pulses.     Heart sounds: Normal heart sounds. No murmur.  Pulmonary:     Effort: Pulmonary effort is normal. No respiratory distress.     Breath sounds: Normal breath sounds. No wheezing or rhonchi.  Abdominal:     General: There is no distension.     Palpations: Abdomen is soft.     Tenderness: There is no abdominal tenderness.  Musculoskeletal:     Cervical back: Neck supple.     Right lower leg: No edema.     Left lower leg: No edema.  Lymphadenopathy:     Cervical: No cervical adenopathy.  Skin:    General: Skin is warm and dry.     Capillary Refill: Capillary refill takes less than 2 seconds.     Findings: No rash.    Neurological:     Mental Status: He is alert and oriented to person, place, and time. Mental status is at baseline.     Cranial Nerves: No cranial nerve deficit.  Psychiatric:        Mood and Affect: Mood normal.        Behavior: Behavior normal.      Results for orders placed or performed in visit on 11/29/19  POCT glycosylated hemoglobin (Hb A1C)  Result Value Ref Range   Hemoglobin A1C 5.8 (A) 4.0 - 5.6 %       Assessment & Plan    Problem List  Items Addressed This Visit      Cardiovascular and Mediastinum   Hypertension - Primary    Well controlled Continue current medications Recheck metabolic panel F/u in 6 months       Relevant Orders   Comprehensive metabolic panel     Respiratory   Obstructive sleep apnea    Uncontrolled With nonrestorative sleep and nighttime awakenings as well as snoring He was not able to tolerate CPAP and does not want to try BiPAP Discussed health risks of uncontrolled sleep apnea      COPD with chronic bronchitis and emphysema (HCC)    Chronic and well controlled No longer taking Anoro Not needing rescue inhaler        Endocrine   T2DM (type 2 diabetes mellitus) ([place])    Well controlled Discussed risks of hypoglycemia with A1c 5.7 He is asymptomatic Complicated by HTN and HLD D/c Metformin  Recheck A1c in 3-6 months UTD on screenings      Relevant Orders   POCT glycosylated hemoglobin (Hb A1C) (Completed)   Hyperlipidemia associated with type 2 diabetes mellitus (HCC)    Previously well controlled Continue lipitor Recheck CMP and FLP      Relevant Orders   Comprehensive metabolic panel   Lipid panel   Diabetic peripheral neuropathy (HCC)    Chronic and well controlled Continue gabapentin at current dose        Other   Obesity    Discussed importance of healthy weight management Discussed diet and exercise       Relevant Orders   Comprehensive metabolic panel   Lipid panel       Return for as  scheduled.   The entirety of the information documented in the History of Present Illness, Review of Systems and Physical Exam were personally obtained by me. Portions of this information were initially documented by Ashley Royalty, CMA and reviewed by me for thoroughness and accuracy.    Jewel Venditto, Dionne Bucy, MD MPH [facility]

## 2019-11-29 NOTE — Assessment & Plan Note (Signed)
Chronic and well controlled No longer taking Anoro Not needing rescue inhaler

## 2019-11-29 NOTE — Assessment & Plan Note (Signed)
Uncontrolled With nonrestorative sleep and nighttime awakenings as well as snoring He was not able to tolerate CPAP and does not want to try BiPAP Discussed health risks of uncontrolled sleep apnea

## 2019-11-29 NOTE — Assessment & Plan Note (Signed)
Well controlled Discussed risks of hypoglycemia with A1c 5.7 He is asymptomatic Complicated by HTN and HLD D/c Metformin  Recheck A1c in 3-6 months UTD on screenings

## 2019-11-30 LAB — COMPREHENSIVE METABOLIC PANEL
ALT: 44 IU/L (ref 0–44)
AST: 22 IU/L (ref 0–40)
Albumin/Globulin Ratio: 1.7 (ref 1.2–2.2)
Albumin: 4.2 g/dL (ref 3.7–4.7)
Alkaline Phosphatase: 75 IU/L (ref 39–117)
BUN/Creatinine Ratio: 19 (ref 10–24)
BUN: 17 mg/dL (ref 8–27)
Bilirubin Total: 0.3 mg/dL (ref 0.0–1.2)
CO2: 18 mmol/L — ABNORMAL LOW (ref 20–29)
Calcium: 9.3 mg/dL (ref 8.6–10.2)
Chloride: 99 mmol/L (ref 96–106)
Creatinine, Ser: 0.89 mg/dL (ref 0.76–1.27)
GFR calc Af Amer: 98 mL/min/{1.73_m2} (ref >59)
GFR calc non Af Amer: 85 mL/min/{1.73_m2} (ref >59)
Globulin, Total: 2.5 g/dL (ref 1.5–4.5)
Glucose: 113 mg/dL — ABNORMAL HIGH (ref 65–99)
Potassium: 3.8 mmol/L (ref 3.5–5.2)
Sodium: 140 mmol/L (ref 134–144)
Total Protein: 6.7 g/dL (ref 6.0–8.5)

## 2019-11-30 LAB — LIPID PANEL
Chol/HDL Ratio: 3.4 ratio (ref 0.0–5.0)
Cholesterol, Total: 108 mg/dL (ref 100–199)
HDL: 32 mg/dL — ABNORMAL LOW (ref >39)
LDL Chol Calc (NIH): 45 mg/dL (ref 0–99)
Triglycerides: 185 mg/dL — ABNORMAL HIGH (ref 0–149)
VLDL Cholesterol Cal: 31 mg/dL (ref 5–40)

## 2019-12-13 NOTE — Progress Notes (Signed)
Subjective:   Juan Le. is a 74 y.o. male who presents for Medicare Annual/Subsequent preventive examination.  Review of Systems:  N/A  Cardiac Risk Factors include: advanced age (>52mn, >>64women);diabetes mellitus;dyslipidemia;male gender;hypertension     Objective:    Vitals: BP 140/64 (BP Location: Right Arm)   Pulse (!) 59   Temp 98.1 F (36.7 C) (Oral)   Ht '5\' 9"'$  (1.753 m)   Wt 225 lb 6.4 oz (102.2 kg)   SpO2 98%   BMI 33.29 kg/m   Body mass index is 33.29 kg/m.  Advanced Directives 12/17/2019 12/13/2018 01/24/2018 08/11/2017 06/16/2017 05/20/2017 03/24/2017  Does Patient Have a Medical Advance Directive? Yes Yes Yes Yes Yes Yes Yes  Type of AParamedicof AEscalonLiving will HAvingerLiving will Living will;Healthcare Power of ALa MonteLiving will HSomersetLiving will H[place]waterLiving will HJeffersonLiving will  Does patient want to make changes to medical advance directive? - - - - - - -  Copy of HAromasin Chart? No - copy requested No - copy requested No - copy requested - - - -  Would patient like information on creating a medical advance directive? - - - - - - -    Tobacco Social History   Tobacco Use  Smoking Status Former Smoker  . Types: Cigarettes  Smokeless Tobacco Never Used  Tobacco Comment   former light smoker; quit in his teens     Counseling given: Not Answered Comment: former light smoker; quit in his teens   Clinical Intake:  Pre-visit preparation completed: Yes  Pain : No/denies pain Pain Score: 0-No pain     Nutritional Status: BMI > 30  Obese Nutritional Risks: None Diabetes: Yes  How often do you need to have someone help you when you read instructions, pamphlets, or other written materials from your doctor or pharmacy?: 1 - Never   Diabetes:  Is the patient diabetic?   Yes  If diabetic, was a CBG obtained today?  No  Did the patient bring in their glucometer from home?  No  How often do you monitor your CBG's? Once and twice daily.   Financial Strains and Diabetes Management:  Are you having any financial strains with the device, your supplies or your medication? No .  Does the patient want to be seen by Chronic Care Management for management of their diabetes?  No  Would the patient like to be referred to a Nutritionist or for Diabetic Management?  No   Diabetic Exams:  Diabetic Eye Exam: Completed 11/21/18. Overdue for diabetic eye exam. Pt has been advised about the importance in completing this exam. Pt plans to schedule apt this year.   Diabetic Foot Exam: Completed 05/22/19. Repeat yearly.  Interpreter Needed?: No  Information [facility] :: [facility]  Past Medical History:  Diagnosis Date  . Anxiety   . Atelectasis of left lung 03/25/2017  . Chronic, continuous use of opioids   . COPD (chronic obstructive pulmonary disease) ([place]   . Degenerative disc disease, lumbar   . Diabetes ([place]   . DJD (degenerative joint disease)   . GERD (gastroesophageal reflux disease)   . Heart attack ([place]   . History of diverticulitis   . Hypertension   . Hypertension   . Hypertriglyceridemia   . Hypokalemia   . Insomnia   . Mitral regurgitation   . Sleep apnea   .  Vitamin D deficiency   . Wears dentures    full upper   Past Surgical History:  Procedure Laterality Date  . COLONOSCOPY WITH PROPOFOL N/A 01/28/2017   Procedure: COLONOSCOPY WITH PROPOFOL;  Surgeon: [name], MD;  Location: [place];  Service: Endoscopy;  Laterality: N/A;  diabetic - oral meds  . CORONARY ARTERY BYPASS GRAFT  1997  . CORONARY STENT PLACEMENT  11/2006  . FOOT SURGERY  2003  . HERNIA REPAIR    . POLYPECTOMY N/A 01/28/2017   Procedure: POLYPECTOMY;  Surgeon: [name], MD;  Location: [place];  Service: Endoscopy;  Laterality: N/A;  .  UMBILICAL HERNIA REPAIR  04/2012   Family History  Problem Relation Age of Onset  . Throat cancer Father   . Colon cancer Father   . Heart disease Father   . Cancer Father   . Depression Father   . Hypertension Father   . Parkinson's disease Father   . Arthritis Brother   . Stroke Mother   . Kidney disease Mother   . Heart disease Mother   . Kidney failure Mother   . Breast cancer Paternal Aunt   . Depression Sister   . Stroke Maternal Grandmother   . Bone cancer Brother   . Stroke Brother   . Prostate cancer Neg Hx   . Kidney cancer Neg Hx   . Bladder Cancer Neg Hx    Social History   Socioeconomic History  . Marital status: Married    Spouse name: Not on file  . Number of children: 2  . Years of education: 78  . Highest education level: High school graduate  Occupational History  . 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 Smoking status: Former Smoker    Types: Cigarettes  . Smokeless tobacco: Never Used  . Tobacco comment: former light smoker; quit in his teens  Substance and Sexual Activity  . Alcohol use: No    Alcohol/week: 0.0 standard drinks  . Drug use: No  . Sexual activity: Yes    Partners: Female  Other Topics Concern  . Not on file  Social History Narrative  . Not on file   Social Determinants of Health   Financial Resource Strain: Low Risk   . Difficulty of Paying Living Expenses: Not hard at all  Food Insecurity: No Food Insecurity  . Worried About Charity fundraiser in the Last Year: Never true  . Ran Out of Food in the Last Year: Never true  Transportation Needs: No Transportation Needs  . Lack of Transportation (Medical): No  . Lack of Transportation (Non-Medical): No  Physical Activity: Inactive  . Days of Exercise per Week: 0 days  . Minutes of Exercise per Session: 0 min  Stress: No Stress Concern Present  . Feeling of Stress : Not at all  Social Connections: Somewhat Isolated   . Frequency of Communication with Friends and Family: More than three times a week  . Frequency of Social Gatherings with Friends and Family: Once a week  . Attends Religious Services: Never  . Active Member of Clubs or Organizations: No  . Attends Archivist Meetings: Never  . Marital Status: Married    Outpatient Encounter Medications as of 12/17/2019  Medication Sig  . aspirin 81 MG tablet Take 1 tablet by mouth daily.  Marland Kitchen atorvastatin (LIPITOR) 40 MG tablet TAKE 1 TABLET BY MOUTH  DAILY AT 6 PM  .  Blood Glucose Monitoring Suppl (ONETOUCH VERIO FLEX SYSTEM) w/Device KIT USE UP TO 4 TIMES DAILY AS  DIRECTED  . cloNIDine (CATAPRES) 0.1 MG tablet TAKE 1 TABLET BY MOUTH TWO  TIMES DAILY  . gabapentin (NEURONTIN) 400 MG capsule TAKE 2 CAPSULES BY MOUTH 3  TIMES DAILY  . glucose blood test strip Use as instructed to test blood sugar 2 times daily  . Lancet Devices (MICROLET NEXT LANCING DEVICE) MISC USE DAILY  . metFORMIN (GLUCOPHAGE-XR) 750 MG 24 hr tablet Take 750 mg by mouth 2 (two) times daily.  . Omega-3 Fatty Acids (FISH OIL) 500 MG CAPS Take 2 capsules by mouth daily.   Marland Kitchen omeprazole (PRILOSEC) 40 MG capsule TAKE 1 CAPSULE BY MOUTH  DAILY  . telmisartan (MICARDIS) 40 MG tablet TAKE 1 TABLET BY MOUTH  DAILY  . traZODone (DESYREL) 100 MG tablet TAKE 2 TABLETS BY MOUTH AT  BEDTIME  . Lancets (ONETOUCH DELICA PLUS [id number]) MISC USE AS DIRECTED TO CHECK  BLOOD GLUCOSE ONCE DAILY (Patient not taking: Reported on 12/17/2019)   No facility-administered encounter medications on file as of 12/17/2019.    Activities of Daily Living In your present state of health, do you have any difficulty performing the following activities: 12/17/2019  Hearing? N  Vision? N  Difficulty concentrating or making decisions? N  Walking or climbing stairs? N  Dressing or bathing? N  Doing errands, shopping? N  Preparing Food and eating ? N  Using the Toilet? N  In the past six months, have you  accidently leaked urine? N  Do you have problems with loss of bowel control? N  Managing your Medications? N  Managing your Finances? N  Housekeeping or managing your Housekeeping? N  Some recent data might be hidden    Patient Care Team: [name], MD as PCP - General (Family Medicine) Anell Barr, OD as Consulting Physician (Optometry) Ernestine Conrad, [employer] as Physician Assistant (Urology) Laverle Hobby, MD as Consulting Physician (Pulmonary Disease) Cathi Roan, [facility] (Pharmacist)   Assessment:   This is a routine wellness examination for [employer].  Exercise Activities and Dietary recommendations Current Exercise Habits: The patient does not participate in regular exercise at present, Exercise limited by: None identified  Goals      Patient Stated   . Medication Adherence (pt-stated)      Current Barriers:  . Sometimes forgets to take medications  Pharmacist Clinical Goal(s): Over the next 90 days, Mr. Kowal will be adherent to all medications as evidenced by pharmacy fill history.   Interventions: . Patient educated on purpose, proper use and potential adverse effects of telmisartan and clonidine . Counseled on the importance of taking clonidine and telmisartan daily, especially clonidine due to rebound high blood pressure . Education on signs and symptoms of high blood pressure, stroke (FAST)  Patient Self Care Activities:  . Take all medication as prescribed  Initial goal documentation       Other   . DIET - DECREASE SODA OR JUICE INTAKE     Recommend to cut back on soda intake to 1 glass a day and substitute with water.     Marland Kitchen DIET - INCREASE WATER INTAKE     Recommend drinking at least 6-8 glasses of water a day     . Exercise 3x per week (30 min per time)     Recommend to exercise for 3 days a week for at least 30 minutes at a time.  Fall Risk Fall Risk  12/17/2019 12/13/2018 12/13/2018 01/24/2018 08/30/2017  Falls in the  past year? 0 0 0 No No  Number falls in past yr: 0 - - - -  Injury with Fall? 0 - - - -   FALL RISK PREVENTION PERTAINING TO THE HOME:  Any stairs in or around the home? No  If so, are there any without handrails? N/A  Home free of loose throw rugs in walkways, pet beds, electrical cords, etc? Yes  Adequate lighting in your home to reduce risk of falls? Yes   ASSISTIVE DEVICES UTILIZED TO PREVENT FALLS:  Life alert? No  Use of a cane, walker or w/c? No  Grab bars in the bathroom? Yes  Shower chair or bench in shower? Yes  Elevated toilet seat or a handicapped toilet? Yes    TIMED UP AND GO:  Was the test performed? No .    Depression Screen PHQ 2/9 Scores 12/17/2019 12/13/2018 01/24/2018 10/03/2017  PHQ - 2 Score 0 0 0 4  PHQ- 9 Score - - - 13    Cognitive Function: Declined today.      6CIT Screen 12/13/2018 01/24/2018 01/17/2017  What Year? 0 points 0 points 0 points  What month? 0 points 0 points 0 points  What time? 0 points 0 points 0 points  Count back from 20 0 points 0 points 0 points  Months in reverse 0 points 0 points 4 points  Repeat phrase 0 points 0 points 2 points  Total Score 0 0 6    Immunization History  Administered Date(s) Administered  . Fluad Quad(high Dose 65+) 06/11/2019  . Influenza, High Dose Seasonal PF 07/17/2015, 07/29/2016, 07/15/2017  . Influenza, Seasonal, Injecte, Preservative Fre 08/14/2008  . Influenza,inj,Quad PF,6+ Mos 07/20/2013, 06/20/2014  . Influenza,inj,quad, With Preservative 08/18/2018  . Influenza-Unspecified 08/18/2018  . Pneumococcal Conjugate-13 08/15/2014  . Pneumococcal Polysaccharide-23 05/11/2012  . Tdap 05/29/2014    Qualifies for Shingles Vaccine? Yes . Due for Shingrix. Pt has been advised to call insurance company to determine out of pocket expense. Advised may also receive vaccine at local pharmacy or Health Dept. Verbalized acceptance and understanding.  Tdap: Up to date  Flu Vaccine: Up to  date  Pneumococcal Vaccine: Completed series  Screening Tests Health Maintenance  Topic Date Due  . OPHTHALMOLOGY EXAM  11/22/2019  . HEMOGLOBIN A1C  05/28/2020  . FOOT EXAM  06/10/2020  . COLONOSCOPY  01/28/2022  . TETANUS/TDAP  05/29/2024  . INFLUENZA VACCINE  Completed  . Hepatitis C Screening  Completed  . PNA vac Low Risk Adult  Completed   Cancer Screenings:  Colorectal Screening: Completed 01/28/17. Repeat every 5 years.   Lung Cancer Screening: (Low Dose CT Chest recommended if Age 70-80 years, 30 pack-year currently smoking OR have quit w/in 15years.) does not qualify.   Additional Screening:  Hepatitis C Screening: Up to date  Dental Screening: Recommended annual dental exams for proper oral hygiene  Community Resource Referral:  CRR required this visit?  No        Plan:  I have personally reviewed and addressed the Medicare Annual Wellness questionnaire and have noted the following in the patient's chart:  A. Medical and social history B. Use of alcohol, tobacco or illicit drugs  C. Current medications and supplements D. Functional ability and status E.  Nutritional status F.  Physical activity G. Advance directives H. List of other physicians I.  Hospitalizations, surgeries, and ER visits in previous 12 months J.  Vitals K. Screenings such as hearing and vision if needed, cognitive and depression L. Referrals and appointments   In addition, I have reviewed and discussed with patient certain preventive protocols, quality metrics, and best practice recommendations. A written personalized care plan for preventive services as well as general preventive health recommendations were provided to patient.   Glendora Score, [place]  05/24/5796 Nurse Health Advisor  Nurse Notes: Pt plans to schedule an eye exam in the near future.

## 2019-12-17 ENCOUNTER — Ambulatory Visit (INDEPENDENT_AMBULATORY_CARE_PROVIDER_SITE_OTHER): Payer: Medicare Other | Admitting: Family Medicine

## 2019-12-17 ENCOUNTER — Encounter: Payer: Self-pay | Admitting: Family Medicine

## 2019-12-17 ENCOUNTER — Other Ambulatory Visit: Payer: Self-pay

## 2019-12-17 ENCOUNTER — Ambulatory Visit (INDEPENDENT_AMBULATORY_CARE_PROVIDER_SITE_OTHER): Payer: Medicare Other

## 2019-12-17 VITALS — BP 125/68 | HR 54 | Temp 98.1°F | Resp 16 | Ht 69.0 in | Wt 225.0 lb

## 2019-12-17 VITALS — BP 140/64 | HR 59 | Temp 98.1°F | Ht 69.0 in | Wt 225.4 lb

## 2019-12-17 DIAGNOSIS — R6889 Other general symptoms and signs: Secondary | ICD-10-CM | POA: Insufficient documentation

## 2019-12-17 DIAGNOSIS — Z0000 Encounter for general adult medical examination without abnormal findings: Secondary | ICD-10-CM

## 2019-12-17 DIAGNOSIS — E669 Obesity, unspecified: Secondary | ICD-10-CM

## 2019-12-17 DIAGNOSIS — Z6832 Body mass index (BMI) 32.0-32.9, adult: Secondary | ICD-10-CM

## 2019-12-17 DIAGNOSIS — E1169 Type 2 diabetes mellitus with other specified complication: Secondary | ICD-10-CM | POA: Diagnosis not present

## 2019-12-17 NOTE — Patient Instructions (Signed)
The CDC recommends two doses of Shingrix (the shingles vaccine) separated by 2 to 6 months for adults age 74 years and older. I recommend checking with your insurance plan regarding coverage for this vaccine.     Preventive Care 9 Years and Older, Male Preventive care refers to lifestyle choices and visits with your health care provider that can promote health and wellness. This includes:  A yearly physical exam. This is also called an annual well check.  Regular dental and eye exams.  Immunizations.  Screening for certain conditions.  Healthy lifestyle choices, such as diet and exercise. What can I expect for my preventive care visit? Physical exam Your health care provider will check:  Height and weight. These may be used to calculate body mass index (BMI), which is a measurement that tells if you are at a healthy weight.  Heart rate and blood pressure.  Your skin for abnormal spots. Counseling Your health care provider may ask you questions about:  Alcohol, tobacco, and drug use.  Emotional well-being.  Home and relationship well-being.  Sexual activity.  Eating habits.  History of falls.  Memory and ability to understand (cognition).  Work and work Statistician. What immunizations do I need?  Influenza (flu) vaccine  This is recommended every year. Tetanus, diphtheria, and pertussis (Tdap) vaccine  You may need a Td booster every 10 years. Varicella (chickenpox) vaccine  You may need this vaccine if you have not already been vaccinated. Zoster (shingles) vaccine  You may need this after age 28. Pneumococcal conjugate (PCV13) vaccine  One dose is recommended after age 76. Pneumococcal polysaccharide (PPSV23) vaccine  One dose is recommended after age 43. Measles, mumps, and rubella (MMR) vaccine  You may need at least one dose of MMR if you were born in 1957 or later. You may also need a second dose. Meningococcal conjugate (MenACWY) vaccine   You may need this if you have certain conditions. Hepatitis A vaccine  You may need this if you have certain conditions or if you travel or work in places where you may be exposed to hepatitis A. Hepatitis B vaccine  You may need this if you have certain conditions or if you travel or work in places where you may be exposed to hepatitis B. Haemophilus influenzae type b (Hib) vaccine  You may need this if you have certain conditions. You may receive vaccines as individual doses or as more than one vaccine together in one shot (combination vaccines). Talk with your health care provider about the risks and benefits of combination vaccines. What tests do I need? Blood tests  Lipid and cholesterol levels. These may be checked every 5 years, or more frequently depending on your overall health.  Hepatitis C test.  Hepatitis B test. Screening  Lung cancer screening. You may have this screening every year starting at age 24 if you have a 30-pack-year history of smoking and currently smoke or have quit within the past 15 years.  Colorectal cancer screening. All adults should have this screening starting at age 46 and continuing until age 61. Your health care provider may recommend screening at age 59 if you are at increased risk. You will have tests every 1-10 years, depending on your results and the type of screening test.  Prostate cancer screening. Recommendations will vary depending on your family history and other risks.  Diabetes screening. This is done by checking your blood sugar (glucose) after you have not eaten for a while (fasting). You may  have this done every 1-3 years.  Abdominal aortic aneurysm (AAA) screening. You may need this if you are a current or former smoker.  Sexually transmitted disease (STD) testing. Follow these instructions at home: Eating and drinking  Eat a diet that includes fresh fruits and vegetables, whole grains, lean protein, and low-fat dairy products.  Limit your intake of foods with high amounts of sugar, saturated fats, and salt.  Take vitamin and mineral supplements as recommended by your health care provider.  Do not drink alcohol if your health care provider tells you not to drink.  If you drink alcohol: ? Limit how much you have to 0-2 drinks a day. ? Be aware of how much alcohol is in your drink. In the [place] one drink equals one 12 oz bottle of beer (355 mL), one 5 oz glass of wine (148 mL), or one 1 oz glass of hard liquor (44 mL). Lifestyle  Take daily care of your teeth and gums.  Stay active. Exercise for at least 30 minutes on 5 or more days each week.  Do not use any products that contain nicotine or tobacco, such as cigarettes, e-cigarettes, and chewing tobacco. If you need help quitting, ask your health care provider.  If you are sexually active, practice safe sex. Use a condom or other form of protection to prevent STIs (sexually transmitted infections).  Talk with your health care provider about taking a low-dose aspirin or statin. What's next?  Visit your health care provider once a year for a well check visit.  Ask your health care provider how often you should have your eyes and teeth checked.  Stay up to date on all vaccines. This information is not intended to replace advice given to you by your health care provider. Make sure you discuss any questions you have with your health care provider. Document Revised: 09/28/2018 Document Reviewed: 09/28/2018 Elsevier Patient Education  [place]. Zoster Vaccine, Recombinant injection What is this medicine? ZOSTER VACCINE (ZOS ter vak SEEN) is used to prevent shingles in adults 74 years old and over. This vaccine is not used to treat shingles or nerve pain from shingles. This medicine may be used for other purposes; ask your health care provider or pharmacist if you have questions. COMMON BRAND NAME(S): [facility] What should I tell my health care provider  before I take this medicine? They need to know if you have any of these conditions:  blood disorders or disease  cancer like leukemia or lymphoma  immune system problems or therapy  an unusual or allergic reaction to vaccines, other medications, foods, dyes, or preservatives  pregnant or trying to get pregnant  breast-feeding How should I use this medicine? This vaccine is for injection in a muscle. It is given by a health care professional. Talk to your pediatrician regarding the use of this medicine in children. This medicine is not approved for use in children. Overdosage: If you think you have taken too much of this medicine contact a poison control center or emergency room at once. NOTE: This medicine is only for you. Do not share this medicine with others. What if I miss a dose? Keep appointments for follow-up (booster) doses as directed. It is important not to miss your dose. Call your doctor or health care professional if you are unable to keep an appointment. What may interact with this medicine?  medicines that suppress your immune system  medicines to treat cancer  steroid medicines like prednisone or cortisone This  list may not describe all possible interactions. Give your health care provider a list of all the medicines, herbs, non-prescription drugs, or dietary supplements you use. Also tell them if you smoke, drink alcohol, or use illegal drugs. Some items may interact with your medicine. What should I watch for while using this medicine? Visit your doctor for regular check ups. This vaccine, like all vaccines, may not fully protect everyone. What side effects may I notice from receiving this medicine? Side effects that you should report to your doctor or health care professional as soon as possible:  allergic reactions like skin rash, itching or hives, swelling of the face, lips, or tongue  breathing problems Side effects that usually do not require medical  attention (report these to your doctor or health care professional if they continue or are bothersome):  chills  headache  fever  nausea, vomiting  redness, warmth, pain, swelling or itching at site where injected  tiredness This list may not describe all possible side effects. Call your doctor for medical advice about side effects. You may report side effects to FDA at 1-800-FDA-1088. Where should I keep my medicine? This vaccine is only given in a clinic, pharmacy, doctor's office, or other health care setting and will not be stored at home. NOTE: This sheet is a summary. It may not cover all possible information. If you have questions about this medicine, talk to your doctor, pharmacist, or health care provider.  2020 Elsevier/Gold Standard (2017-05-16 13:20:30)

## 2019-12-17 NOTE — Progress Notes (Signed)
Patient: Juan Gist., Male    DOB: May 03, 1946, 74 y.o.   MRN: [id number] Visit Date: 12/17/2019  Today's Provider: [name], MD   Chief Complaint  Patient presents with  . Annual Exam   Subjective:    I Juan Le, CMA, am acting as scribe for [name], MD.   Complete Physical Juan Le. is a 74 y.o. male. He feels well. He reports exercising no. He reports he is sleeping fairly well. Patient reports waking up every two hours. 09/04/2018 1.65 01/28/2017 Colonoscopy-Polyps, diverticulosis. Pathology-Tubular adenomas   Report came in from [place] [facility] home visit which showed normal left, but moderate severity of obstruction on the PS that was used to screen for peripheral artery disease.  Patient denies any claudication symptoms.  He does occasionally have neuropathy symptoms in his right foot. -----------------------------------------------------------   Review of Systems  Constitutional: Negative.   HENT: Negative.   Eyes: Negative.   Respiratory: Negative.   Cardiovascular: Negative.   Gastrointestinal: Negative.   Endocrine: Negative.   Genitourinary: Negative.   Musculoskeletal: Negative.   Skin: Negative.   Allergic/Immunologic: Negative.   Neurological: Negative.   Hematological: Negative.   Psychiatric/Behavioral: Positive for sleep disturbance.    Social History   Socioeconomic History  . Marital status: Married    Spouse name: Not on file  . Number of children: 2  . Years of education: 66  . Highest education level: High school graduate  Occupational History  . 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 Smoking status: Former Smoker    Types: Cigarettes  . Smokeless tobacco: Never Used  . Tobacco comment: former light smoker; quit in his teens  Substance and Sexual Activity  . Alcohol use: No    Alcohol/week: 0.0 standard drinks  . Drug use: No    . Sexual activity: Yes    Partners: Female  Other Topics Concern  . Not on file  Social History Narrative  . Not on file   Social Determinants of Health   Financial Resource Strain: Low Risk   . Difficulty of Paying Living Expenses: Not hard at all  Food Insecurity: No Food Insecurity  . Worried About Charity fundraiser in the Last Year: Never true  . Ran Out of Food in the Last Year: Never true  Transportation Needs: No Transportation Needs  . Lack of Transportation (Medical): No  . Lack of Transportation (Non-Medical): No  Physical Activity: Inactive  . Days of Exercise per Week: 0 days  . Minutes of Exercise per Session: 0 min  Stress: No Stress Concern Present  . Feeling of Stress : Not at all  Social Connections: Somewhat Isolated  . Frequency of Communication with Friends and Family: More than three times a week  . Frequency of Social Gatherings with Friends and Family: Once a week  . Attends Religious Services: Never  . Active Member of Clubs or Organizations: No  . Attends Archivist Meetings: Never  . Marital Status: Married  Human resources officer Violence: Not At Risk  . Fear of Current or Ex-Partner: No  . Emotionally Abused: No  . Physically Abused: No  . Sexually Abused: No    Past Medical History:  Diagnosis Date  . Anxiety   . Atelectasis of left lung 03/25/2017  . Chronic, continuous use of opioids   . COPD (chronic obstructive pulmonary disease) ([place])   .  Degenerative disc disease, lumbar   . Diabetes ([place])   . DJD (degenerative joint disease)   . GERD (gastroesophageal reflux disease)   . Heart attack ([place])   . History of diverticulitis   . Hypertension   . Hypertension   . Hypertriglyceridemia   . Hypokalemia   . Insomnia   . Mitral regurgitation   . Sleep apnea   . Vitamin D deficiency   . Wears dentures    full upper     Patient Active Problem List   Diagnosis Date Noted  . COPD with chronic bronchitis and emphysema ([place])  03/08/2019  . Obesity 06/12/2018  . History of acute myocardial infarction 03/10/2018  . Obstructive sleep apnea 03/10/2018  . Hx of colonic polyps   . Benign neoplasm of ascending colon   . Benign neoplasm of descending colon   . Atherosclerosis of coronary artery 01/20/2017  . DDD (degenerative disc disease), lumbar 01/20/2017  . Dermatitis, eczematoid 01/20/2017  . Cervical spinal stenosis 01/20/2017  . MI (mitral incompetence) 01/20/2017  . Osteoarthritis of right foot 04/16/2016  . Chronic neck and back pain 08/13/2015  . GERD (gastroesophageal reflux disease) 06/12/2015  . Hypertension 06/12/2015  . Diabetic peripheral neuropathy ([place]) 06/12/2015  . Nocturnal leg cramps 05/12/2015  . T2DM (type 2 diabetes mellitus) ([place]) 04/16/2015  . Hyperlipidemia associated with type 2 diabetes mellitus ([place]) 04/16/2015  . Insomnia 04/16/2015  . H/O coronary artery bypass surgery 04/16/2015  . Anxiety 04/11/2015  . DDD (degenerative disc disease), cervical 04/11/2015  . H/O adenomatous polyp of colon 06/10/2011    Past Surgical History:  Procedure Laterality Date  . COLONOSCOPY WITH PROPOFOL N/A 01/28/2017   Procedure: COLONOSCOPY WITH PROPOFOL;  Surgeon: [name], MD;  Location: [place];  Service: Endoscopy;  Laterality: N/A;  diabetic - oral meds  . CORONARY ARTERY BYPASS GRAFT  1997  . CORONARY STENT PLACEMENT  11/2006  . FOOT SURGERY  2003  . HERNIA REPAIR    . POLYPECTOMY N/A 01/28/2017   Procedure: POLYPECTOMY;  Surgeon: [name], MD;  Location: [place];  Service: Endoscopy;  Laterality: N/A;  . UMBILICAL HERNIA REPAIR  04/2012    His family history includes Arthritis in his brother; Bone cancer in his brother; Breast cancer in his paternal aunt; Cancer in his father; Colon cancer in his father; Depression in his father and sister; Heart disease in his father and mother; Hypertension in his father; Kidney disease in his mother; Kidney failure in his  mother; Parkinson's disease in his father; Stroke in his brother, maternal grandmother, and mother; Throat cancer in his father. There is no history of Prostate cancer, Kidney cancer, or Bladder Cancer.   Current Outpatient Medications:  .  aspirin 81 MG tablet, Take 1 tablet by mouth daily., Disp: , Rfl:  .  atorvastatin (LIPITOR) 40 MG tablet, TAKE 1 TABLET BY MOUTH  DAILY AT 6 PM, Disp: 90 tablet, Rfl: 3 .  Blood Glucose Monitoring Suppl (ONETOUCH VERIO FLEX SYSTEM) w/Device KIT, USE UP TO 4 TIMES DAILY AS  DIRECTED, Disp: 1 kit, Rfl: 0 .  cloNIDine (CATAPRES) 0.1 MG tablet, TAKE 1 TABLET BY MOUTH TWO  TIMES DAILY, Disp: 180 tablet, Rfl: 3 .  gabapentin (NEURONTIN) 400 MG capsule, TAKE 2 CAPSULES BY MOUTH 3  TIMES DAILY, Disp: 540 capsule, Rfl: 3 .  glucose blood test strip, Use as instructed to test blood sugar 2 times daily, Disp: 300 each, Rfl: 3 .  Lancet Devices ([place] NEXT  LANCING DEVICE) MISC, USE DAILY, Disp: 1 each, Rfl: 0 .  metFORMIN (GLUCOPHAGE-XR) 750 MG 24 hr tablet, Take 750 mg by mouth 2 (two) times daily., Disp: , Rfl:  .  Omega-3 Fatty Acids (FISH OIL) 500 MG CAPS, Take 2 capsules by mouth daily. , Disp: , Rfl:  .  omeprazole (PRILOSEC) 40 MG capsule, TAKE 1 CAPSULE BY MOUTH  DAILY, Disp: 90 capsule, Rfl: 3 .  telmisartan (MICARDIS) 40 MG tablet, TAKE 1 TABLET BY MOUTH  DAILY, Disp: 90 tablet, Rfl: 3 .  traZODone (DESYREL) 100 MG tablet, TAKE 2 TABLETS BY MOUTH AT  BEDTIME, Disp: 180 tablet, Rfl: 3 .  Lancets (ONETOUCH DELICA PLUS [id number]) MISC, USE AS DIRECTED TO CHECK  BLOOD GLUCOSE ONCE DAILY (Patient not taking: Reported on 12/17/2019), Disp: 100 each, Rfl: 3  Patient Care Team: [name], MD as PCP - General (Family Medicine) Anell Barr, OD as Consulting Physician (Optometry) Ernestine Conrad, [employer] [employer] as Physician Assistant (Urology) Laverle Hobby, MD as Consulting Physician (Pulmonary Disease) Cathi Roan, [facility] (Pharmacist)       Objective:    Vitals: BP 140/64 (BP Location: Right Arm, Patient Position: Sitting, Cuff Size: Large)   Pulse (!) 59   Temp 98.1 F (36.7 C) (Oral)   Resp 16   Ht '5\' 9"'$  (1.753 m)   Wt 225 lb (102.1 kg)   BMI 33.23 kg/m   Physical Exam Vitals reviewed.  Constitutional:      General: He is not in acute distress.    Appearance: Normal appearance. He is well-developed. He is not diaphoretic.  HENT:     Head: Normocephalic and atraumatic.     Right Ear: Tympanic membrane, ear canal and external ear normal.     Left Ear: Tympanic membrane, ear canal and external ear normal.  Neck:     Thyroid: No thyromegaly.  Cardiovascular:     Rate and Rhythm: Normal rate and regular rhythm.     Pulses: Normal pulses.     Heart sounds: Normal heart sounds. No murmur.  Pulmonary:     Effort: Pulmonary effort is normal. No respiratory distress.     Breath sounds: Normal breath sounds. No wheezing or rales.  Abdominal:     General: There is no distension.     Palpations: Abdomen is soft.     Tenderness: There is no abdominal tenderness. There is no guarding or rebound.  Musculoskeletal:        General: No deformity.     Cervical back: Neck supple.     Right lower leg: No edema.     Left lower leg: No edema.  Lymphadenopathy:     Cervical: No cervical adenopathy.  Skin:    General: Skin is warm and dry.     Capillary Refill: Capillary refill takes less than 2 seconds.     Findings: No rash.  Neurological:     Mental Status: He is alert and oriented to person, place, and time. Mental status is at baseline.  Psychiatric:        Mood and Affect: Mood normal.        Behavior: Behavior normal.        Thought Content: Thought content normal.     Activities of Daily Living In your present state of health, do you have any difficulty performing the following activities: 12/17/2019  Hearing? N  Vision? N  Difficulty concentrating or making decisions? N  Walking or climbing stairs? N   Dressing  or bathing? N  Doing errands, shopping? N  Preparing Food and eating ? N  Using the Toilet? N  In the past six months, have you accidently leaked urine? N  Do you have problems with loss of bowel control? N  Managing your Medications? N  Managing your Finances? N  Housekeeping or managing your Housekeeping? N  Some recent data might be hidden    Fall Risk Assessment Fall Risk  12/17/2019 12/13/2018 12/13/2018 01/24/2018 08/30/2017  Falls in the past year? 0 0 0 No No  Number falls in past yr: 0 - - - -  Injury with Fall? 0 - - - -     Depression Screen PHQ 2/9 Scores 12/17/2019 12/13/2018 01/24/2018 10/03/2017  PHQ - 2 Score 0 0 0 4  PHQ- 9 Score - - - 13    6CIT Screen 12/13/2018  What Year? 0 points  What month? 0 points  What time? 0 points  Count back from 20 0 points  Months in reverse 0 points  Repeat phrase 0 points  Total Score 0       Assessment & Plan:    Annual Physical Reviewed patient's Family Medical History Reviewed and updated list of patient's medical providers Assessment of cognitive impairment was done Assessed patient's functional ability Established a written schedule for health screening [place] Completed and Reviewed  Exercise Activities and Dietary recommendations Goals    . DIET - DECREASE SODA OR JUICE INTAKE     Recommend to cut back on soda intake to 1 glass a day and substitute with water.     Marland Kitchen DIET - INCREASE WATER INTAKE     Recommend drinking at least 6-8 glasses of water a day     . Exercise 3x per week (30 min per time)     Recommend to exercise for 3 days a week for at least 30 minutes at a time.     . Medication Adherence (pt-stated)      Current Barriers:  . Sometimes forgets to take medications  Pharmacist Clinical Goal(s): Over the next 90 days, Juan Le will be adherent to all medications as evidenced by pharmacy fill history.   Interventions: . Patient educated on purpose, proper use  and potential adverse effects of telmisartan and clonidine . Counseled on the importance of taking clonidine and telmisartan daily, especially clonidine due to rebound high blood pressure . Education on signs and symptoms of high blood pressure, stroke (FAST)  Patient Self Care Activities:  . Take all medication as prescribed  Initial goal documentation        Immunization History  Administered Date(s) Administered  . Fluad Quad(high Dose 65+) 06/11/2019  . Influenza, High Dose Seasonal PF 07/17/2015, 07/29/2016, 07/15/2017  . Influenza, Seasonal, Injecte, Preservative Fre 08/14/2008  . Influenza,inj,Quad PF,6+ Mos 07/20/2013, 06/20/2014  . Influenza,inj,quad, With Preservative 08/18/2018  . Influenza-Unspecified 08/18/2018  . Pneumococcal Conjugate-13 08/15/2014  . Pneumococcal Polysaccharide-23 05/11/2012  . Tdap 05/29/2014    Health Maintenance  Topic Date Due  . OPHTHALMOLOGY EXAM  11/22/2019  . HEMOGLOBIN A1C  05/28/2020  . FOOT EXAM  06/10/2020  . COLONOSCOPY  01/28/2022  . TETANUS/TDAP  05/29/2024  . INFLUENZA VACCINE  Completed  . Hepatitis C Screening  Completed  . PNA vac Low Risk Adult  Completed     Discussed health benefits of physical activity, and encouraged him to engage in regular exercise appropriate for his age and condition.    ------------------------------------------------------------------------------------------------------------  Problem  List Items Addressed This Visit      Endocrine   T2DM (type 2 diabetes mellitus) ([place])    Recently resumed metformin at previous dose due to elevated blood glucoses in high 200s after stopping it Next chronic disease f/u in ~31m       Other   Obesity    Discussed importance of healthy weight management Discussed diet and exercise       Abnormal ankle brachial index (ABI)    Noted to have abnormal R screening for PAD per home health Pulse feels normal and he is asymptomatic Recommend referral to  vascular to further eval and rule out as he is at risk for vascular disease      Relevant Orders   Ambulatory referral to Vascular Surgery    Other Visit Diagnoses    Encounter for annual physical exam    -  Primary       Return in about 3 months (around 03/18/2020) for chronic disease f/u.   The entirety of the information documented in the History of Present Illness, Review of Systems and Physical Exam were personally obtained by me. Portions of this information were initially documented by SLynford Humphrey CMA and reviewed by me for thoroughness and accuracy.    Juan Le, ADionne Bucy MD MPH [facility]

## 2019-12-17 NOTE — Assessment & Plan Note (Signed)
Discussed importance of healthy weight management Discussed diet and exercise  

## 2019-12-17 NOTE — Assessment & Plan Note (Signed)
Noted to have abnormal R screening for PAD per home health Pulse feels normal and he is asymptomatic Recommend referral to vascular to further eval and rule out as he is at risk for vascular disease

## 2019-12-17 NOTE — Assessment & Plan Note (Signed)
Recently resumed metformin at previous dose due to elevated blood glucoses in high 200s after stopping it Next chronic disease f/u in ~11m

## 2019-12-17 NOTE — Patient Instructions (Signed)
Juan Le , Thank you for taking time to come for your Medicare Wellness Visit. I appreciate your ongoing commitment to your health goals. Please review the following plan we discussed and let me know if I can assist you in the future.   Screening recommendations/referrals: Colonoscopy: Up to date, due 01/2022 Recommended yearly ophthalmology/optometry visit for glaucoma screening and checkup Recommended yearly dental visit for hygiene and checkup  Vaccinations: Influenza vaccine: Up to date Pneumococcal vaccine: Completed series Tdap vaccine: Up to date, due 05/2024 Shingles vaccine: Pt declines today.     Advanced directives: Please bring a copy of your POA (Power of Attorney) and/or Living Will to your next appointment.   Conditions/risks identified: Recommend to continue to cut back on soda intake to no more than 1 a day and increase water intake. Recommend to start walking 3 days a week for at least 30 minutes at a time.   Next appointment: 9:20 AM with Dr Brita Romp   Preventive Care 74 Years and Older, Male Preventive care refers to lifestyle choices and visits with your health care provider that can promote health and wellness. What does preventive care include?  A yearly physical exam. This is also called an annual well check.  Dental exams once or twice a year.  Routine eye exams. Ask your health care provider how often you should have your eyes checked.  Personal lifestyle choices, including:  Daily care of your teeth and gums.  Regular physical activity.  Eating a healthy diet.  Avoiding tobacco and drug use.  Limiting alcohol use.  Practicing safe sex.  Taking low doses of aspirin every day.  Taking vitamin and mineral supplements as recommended by your health care provider. What happens during an annual well check? The services and screenings done by your health care provider during your annual well check will depend on your age, overall health,  lifestyle risk factors, and family history of disease. Counseling  Your health care provider may ask you questions about your:  Alcohol use.  Tobacco use.  Drug use.  Emotional well-being.  Home and relationship well-being.  Sexual activity.  Eating habits.  History of falls.  Memory and ability to understand (cognition).  Work and work Statistician. Screening  You may have the following tests or measurements:  Height, weight, and BMI.  Blood pressure.  Lipid and cholesterol levels. These may be checked every 5 years, or more frequently if you are over 68 years old.  Skin check.  Lung cancer screening. You may have this screening every year starting at age 26 if you have a 30-pack-year history of smoking and currently smoke or have quit within the past 15 years.  Fecal occult blood test (FOBT) of the stool. You may have this test every year starting at age 45.  Flexible sigmoidoscopy or colonoscopy. You may have a sigmoidoscopy every 5 years or a colonoscopy every 10 years starting at age 24.  Prostate cancer screening. Recommendations will vary depending on your family history and other risks.  Hepatitis C blood test.  Hepatitis B blood test.  Sexually transmitted disease (STD) testing.  Diabetes screening. This is done by checking your blood sugar (glucose) after you have not eaten for a while (fasting). You may have this done every 1-3 years.  Abdominal aortic aneurysm (AAA) screening. You may need this if you are a current or former smoker.  Osteoporosis. You may be screened starting at age 18 if you are at high risk. Talk with your health  care provider about your test results, treatment options, and if necessary, the need for more tests. Vaccines  Your health care provider may recommend certain vaccines, such as:  Influenza vaccine. This is recommended every year.  Tetanus, diphtheria, and acellular pertussis (Tdap, Td) vaccine. You may need a Td booster  every 10 years.  Zoster vaccine. You may need this after age 70.  Pneumococcal 13-valent conjugate (PCV13) vaccine. One dose is recommended after age 3.  Pneumococcal polysaccharide (PPSV23) vaccine. One dose is recommended after age 62. Talk to your health care provider about which screenings and vaccines you need and how often you need them. This information is not intended to replace advice given to you by your health care provider. Make sure you discuss any questions you have with your health care provider. Document Released: 10/31/2015 Document Revised: 06/23/2016 Document Reviewed: 08/05/2015 Elsevier Interactive Patient Education  2017 [place] Prevention in the Home Falls can cause injuries. They can happen to people of all ages. There are many things you can do to make your home safe and to help prevent falls. What can I do on the outside of my home?  Regularly fix the edges of walkways and driveways and fix any cracks.  Remove anything that might make you trip as you walk through a door, such as a raised step or threshold.  Trim any bushes or trees on the path to your home.  Use bright outdoor lighting.  Clear any walking paths of anything that might make someone trip, such as rocks or tools.  Regularly check to see if handrails are loose or broken. Make sure that both sides of any steps have handrails.  Any raised decks and porches should have guardrails on the edges.  Have any leaves, snow, or ice cleared regularly.  Use sand or salt on walking paths during winter.  Clean up any spills in your garage right away. This includes oil or grease spills. What can I do in the bathroom?  Use night lights.  Install grab bars by the toilet and in the tub and shower. Do not use towel bars as grab bars.  Use non-skid mats or decals in the tub or shower.  If you need to sit down in the shower, use a plastic, non-slip stool.  Keep the floor dry. Clean up any  water that spills on the floor as soon as it happens.  Remove soap buildup in the tub or shower regularly.  Attach bath mats securely with double-sided non-slip rug tape.  Do not have throw rugs and other things on the floor that can make you trip. What can I do in the bedroom?  Use night lights.  Make sure that you have a light by your bed that is easy to reach.  Do not use any sheets or blankets that are too big for your bed. They should not hang down onto the floor.  Have a firm chair that has side arms. You can use this for support while you get dressed.  Do not have throw rugs and other things on the floor that can make you trip. What can I do in the kitchen?  Clean up any spills right away.  Avoid walking on wet floors.  Keep items that you use a lot in easy-to-reach places.  If you need to reach something above you, use a strong step stool that has a grab bar.  Keep electrical cords out of the way.  Do not use floor  polish or wax that makes floors slippery. If you must use wax, use non-skid floor wax.  Do not have throw rugs and other things on the floor that can make you trip. What can I do with my stairs?  Do not leave any items on the stairs.  Make sure that there are handrails on both sides of the stairs and use them. Fix handrails that are broken or loose. Make sure that handrails are as long as the stairways.  Check any carpeting to make sure that it is firmly attached to the stairs. Fix any carpet that is loose or worn.  Avoid having throw rugs at the top or bottom of the stairs. If you do have throw rugs, attach them to the floor with carpet tape.  Make sure that you have a light switch at the top of the stairs and the bottom of the stairs. If you do not have them, ask someone to add them for you. What else can I do to help prevent falls?  Wear shoes that:  Do not have high heels.  Have rubber bottoms.  Are comfortable and fit you well.  Are closed  at the toe. Do not wear sandals.  If you use a stepladder:  Make sure that it is fully opened. Do not climb a closed stepladder.  Make sure that both sides of the stepladder are locked into place.  Ask someone to hold it for you, if possible.  Clearly mark and make sure that you can see:  Any grab bars or handrails.  First and last steps.  Where the edge of each step is.  Use tools that help you move around (mobility aids) if they are needed. These include:  Canes.  Walkers.  Scooters.  Crutches.  Turn on the lights when you go into a dark area. Replace any light bulbs as soon as they burn out.  Set up your furniture so you have a clear path. Avoid moving your furniture around.  If any of your floors are uneven, fix them.  If there are any pets around you, be aware of where they are.  Review your medicines with your doctor. Some medicines can make you feel dizzy. This can increase your chance of falling. Ask your doctor what other things that you can do to help prevent falls. This information is not intended to replace advice given to you by your health care provider. Make sure you discuss any questions you have with your health care provider. Document Released: 07/31/2009 Document Revised: 03/11/2016 Document Reviewed: 11/08/2014 Elsevier Interactive Patient Education  2017 [employer].

## 2019-12-25 ENCOUNTER — Other Ambulatory Visit (INDEPENDENT_AMBULATORY_CARE_PROVIDER_SITE_OTHER): Payer: Self-pay | Admitting: Vascular Surgery

## 2019-12-25 ENCOUNTER — Encounter (INDEPENDENT_AMBULATORY_CARE_PROVIDER_SITE_OTHER): Payer: Self-pay | Admitting: Vascular Surgery

## 2019-12-25 ENCOUNTER — Other Ambulatory Visit: Payer: Self-pay

## 2019-12-25 ENCOUNTER — Other Ambulatory Visit (INDEPENDENT_AMBULATORY_CARE_PROVIDER_SITE_OTHER): Payer: Medicare Other

## 2019-12-25 ENCOUNTER — Ambulatory Visit (INDEPENDENT_AMBULATORY_CARE_PROVIDER_SITE_OTHER): Payer: Medicare Other | Admitting: Vascular Surgery

## 2019-12-25 VITALS — BP 127/80 | HR 55 | Resp 14 | Ht 70.0 in | Wt 220.0 lb

## 2019-12-25 DIAGNOSIS — R0989 Other specified symptoms and signs involving the circulatory and respiratory systems: Secondary | ICD-10-CM | POA: Diagnosis not present

## 2019-12-25 DIAGNOSIS — I10 Essential (primary) hypertension: Secondary | ICD-10-CM | POA: Diagnosis not present

## 2019-12-25 DIAGNOSIS — E1169 Type 2 diabetes mellitus with other specified complication: Secondary | ICD-10-CM | POA: Diagnosis not present

## 2019-12-25 DIAGNOSIS — R202 Paresthesia of skin: Secondary | ICD-10-CM | POA: Diagnosis not present

## 2019-12-25 DIAGNOSIS — R200 Anesthesia of skin: Secondary | ICD-10-CM | POA: Insufficient documentation

## 2019-12-25 NOTE — Assessment & Plan Note (Signed)
We performed noninvasive studies today to evaluate his arterial perfusion.  He had normal triphasic waveforms and ABIs of 1.2 bilaterally.  Digit pressures and waveforms were normal throughout all 5 digits in both lower extremities.  No significant arterial insufficiency is present.   It would appear as if the screening study was incorrect as a more formal study shows this to be completely normal.  No evidence of arterial insufficiency but I discussed that it is important to have any abnormal findings more thoroughly assessed such as this but was still worth doing the procedure.  He voices his understanding.  He can return to clinic as needed.

## 2019-12-25 NOTE — Progress Notes (Signed)
Patient ID: Juan Gist., male   DOB: 1946/07/27, 74 y.o.   MRN: [id number]  Chief Complaint  Patient presents with  . New Patient (Initial Visit)    NP bacigalupo abnormal ABI    HPI Juan Kuehl. is a 74 y.o. male.  I am asked to see the patient by Dr. Brita Romp for evaluation of an abnormal home health screening study that suggested significant right lower extremity PAD.  The patient has had some numbness and tingling in his feet and toes bilaterally.  No nonhealing ulceration.  No lifestyle limiting claudication symptoms.  He denies any ischemic rest pain.  We performed noninvasive studies today to evaluate his arterial perfusion.  He had normal triphasic waveforms and ABIs of 1.2 bilaterally.  Digit pressures and waveforms were normal throughout all 5 digits in both lower extremities.  No significant arterial insufficiency is present.     Past Medical History:  Diagnosis Date  . Anxiety   . Atelectasis of left lung 03/25/2017  . Chronic, continuous use of opioids   . COPD (chronic obstructive pulmonary disease) ([place])   . Degenerative disc disease, lumbar   . Diabetes ([place])   . DJD (degenerative joint disease)   . GERD (gastroesophageal reflux disease)   . Heart attack ([place])   . History of diverticulitis   . Hypertension   . Hypertension   . Hypertriglyceridemia   . Hypokalemia   . Insomnia   . Mitral regurgitation   . Sleep apnea   . Vitamin D deficiency   . Wears dentures    full upper    Past Surgical History:  Procedure Laterality Date  . COLONOSCOPY WITH PROPOFOL N/A 01/28/2017   Procedure: COLONOSCOPY WITH PROPOFOL;  Surgeon: [name], MD;  Location: [facility];  Service: Endoscopy;  Laterality: N/A;  diabetic - oral meds  . CORONARY ARTERY BYPASS GRAFT  1997  . CORONARY STENT PLACEMENT  11/2006  . FOOT SURGERY  2003  . HERNIA REPAIR    . POLYPECTOMY N/A 01/28/2017   Procedure: POLYPECTOMY;  Surgeon: [name], MD;  Location:  [facility];  Service: Endoscopy;  Laterality: N/A;  . UMBILICAL HERNIA REPAIR  04/2012     Family History  Problem Relation Age of Onset  . Throat cancer Father   . Colon cancer Father   . Heart disease Father   . Cancer Father   . Depression Father   . Hypertension Father   . Parkinson's disease Father   . Arthritis Brother   . Stroke Mother   . Kidney disease Mother   . Heart disease Mother   . Kidney failure Mother   . Breast cancer Paternal Aunt   . Depression Sister   . Stroke Maternal Grandmother   . Bone cancer Brother   . Stroke Brother   . Prostate cancer Neg Hx   . Kidney cancer Neg Hx   . Bladder Cancer Neg Hx     Social History   Tobacco Use  . Smoking status: Former Smoker    Types: Cigarettes  . Smokeless tobacco: Never Used  . Tobacco comment: former light smoker; quit in his teens  Substance Use Topics  . Alcohol use: No    Alcohol/week: 0.0 standard drinks  . Drug use: No     Allergies  Allergen Reactions  . Ace Inhibitors Cough  . Diphenhydramine     stomach upset    Current Outpatient Medications  Medication Sig Dispense Refill  .  aspirin 81 MG tablet Take 1 tablet by mouth daily.    Marland Kitchen atorvastatin (LIPITOR) 40 MG tablet TAKE 1 TABLET BY MOUTH  DAILY AT 6 PM 90 tablet 3  . Blood Glucose Monitoring Suppl (ONETOUCH VERIO FLEX SYSTEM) w/Device KIT USE UP TO 4 TIMES DAILY AS  DIRECTED 1 kit 0  . cloNIDine (CATAPRES) 0.1 MG tablet TAKE 1 TABLET BY MOUTH TWO  TIMES DAILY 180 tablet 3  . gabapentin (NEURONTIN) 400 MG capsule TAKE 2 CAPSULES BY MOUTH 3  TIMES DAILY 540 capsule 3  . glucose blood test strip Use as instructed to test blood sugar 2 times daily 300 each 3  . Lancet Devices (MICROLET NEXT LANCING DEVICE) MISC USE DAILY 1 each 0  . metFORMIN (GLUCOPHAGE-XR) 750 MG 24 hr tablet Take 750 mg by mouth 2 (two) times daily.    . Omega-3 Fatty Acids (FISH OIL) 500 MG CAPS Take 2 capsules by mouth daily.     Marland Kitchen omeprazole (PRILOSEC)  40 MG capsule TAKE 1 CAPSULE BY MOUTH  DAILY 90 capsule 3  . telmisartan (MICARDIS) 40 MG tablet TAKE 1 TABLET BY MOUTH  DAILY 90 tablet 3  . traZODone (DESYREL) 100 MG tablet TAKE 2 TABLETS BY MOUTH AT  BEDTIME 180 tablet 3  . Lancets (ONETOUCH DELICA PLUS [id number]) MISC USE AS DIRECTED TO CHECK  BLOOD GLUCOSE ONCE DAILY (Patient not taking: Reported on 12/17/2019) 100 each 3   No current facility-administered medications for this visit.      REVIEW OF SYSTEMS (Negative unless checked)  Constitutional: []Weight loss  []Fever  []Chills Cardiac: []Chest pain   []Chest pressure   []Palpitations   []Shortness of breath when laying flat   []Shortness of breath at rest   []Shortness of breath with exertion. Vascular:  []Pain in legs with walking   []Pain in legs at rest   []Pain in legs when laying flat   []Claudication   []Pain in feet when walking  []Pain in feet at rest  []Pain in feet when laying flat   []History of DVT   []Phlebitis   []Swelling in legs   []Varicose veins   []Non-healing ulcers Pulmonary:   []Uses home oxygen   []Productive cough   []Hemoptysis   []Wheeze  []COPD   []Asthma Neurologic:  []Dizziness  []Blackouts   []Seizures   []History of stroke   []History of TIA  []Aphasia   []Temporary blindness   []Dysphagia   []Weakness or numbness in arms   []Weakness or numbness in legs Musculoskeletal:  []Arthritis   []Joint swelling   []Joint pain   []Low back pain Hematologic:  []Easy bruising  []Easy bleeding   []Hypercoagulable state   []Anemic  []Hepatitis Gastrointestinal:  []Blood in stool   []Vomiting blood  []Gastroesophageal reflux/heartburn   []Abdominal pain Genitourinary:  []Chronic kidney disease   []Difficult urination  []Frequent urination  []Burning with urination   []Hematuria Skin:  []Rashes   []Ulcers   []Wounds Psychological:  []History of anxiety   [] History of major depression.    Physical Exam BP 127/80 (BP Location: Left Arm)   Pulse (!) 55   Resp 14    Ht 5' 10" (1.778 m)   Wt 220 lb (99.8 kg)   BMI 31.57 kg/m  Gen:  WD/WN, NAD.  Appears younger than stated age Head: [place]/AT, No temporalis wasting. Ear/Nose/Throat: Hearing grossly intact, nares w/o erythema or drainage, oropharynx w/o Erythema/Exudate Eyes: Conjunctiva clear, sclera non-icteric  Neck: trachea midline.  No JVD.  Pulmonary:  Good air movement, respirations not labored, no use of accessory muscles  Cardiac: RRR, no JVD Vascular:  Vessel Right Left  Radial Palpable Palpable                          DP  palpable  palpable  PT  palpable  palpable   Gastrointestinal:. No masses, surgical incisions, or scars. Musculoskeletal: M/S 5/5 throughout.  Extremities without ischemic changes.  No deformity or atrophy.  No significant lower extremity edema. Neurologic: Sensation grossly intact in extremities.  Symmetrical.  Speech is fluent. Motor exam as listed above. Psychiatric: Judgment intact, Mood & affect appropriate for pt's clinical situation. Dermatologic: No rashes or ulcers noted.  No cellulitis or open wounds.    Radiology No results found.  Labs Recent Results (from the past 2160 hour(s))  POCT glycosylated hemoglobin (Hb A1C)     Status: Abnormal   Collection Time: 11/29/19  9:08 AM  Result Value Ref Range   Hemoglobin A1C 5.8 (A) 4.0 - 5.6 %  Comprehensive metabolic panel     Status: Abnormal   Collection Time: 11/29/19  9:43 AM  Result Value Ref Range   Glucose 113 (H) 65 - 99 mg/dL   BUN 17 8 - 27 mg/dL   Creatinine, Ser 0.89 0.76 - 1.27 mg/dL   GFR calc non Af Amer 85 >59 mL/min/1.73   GFR calc Af Amer 98 >59 mL/min/1.73   BUN/Creatinine Ratio 19 10 - 24   Sodium 140 134 - 144 mmol/L   Potassium 3.8 3.5 - 5.2 mmol/L   Chloride 99 96 - 106 mmol/L   CO2 18 (L) 20 - 29 mmol/L   Calcium 9.3 8.6 - 10.2 mg/dL   Total Protein 6.7 6.0 - 8.5 g/dL   Albumin 4.2 3.7 - 4.7 g/dL   Globulin, Total 2.5 1.5 - 4.5 g/dL   Albumin/Globulin Ratio 1.7 1.2 - 2.2     Bilirubin Total 0.3 0.0 - 1.2 mg/dL   Alkaline Phosphatase 75 39 - 117 IU/L   AST 22 0 - 40 IU/L   ALT 44 0 - 44 IU/L  Lipid panel     Status: Abnormal   Collection Time: 11/29/19  9:43 AM  Result Value Ref Range   Cholesterol, Total 108 100 - 199 mg/dL   Triglycerides 185 (H) 0 - 149 mg/dL   HDL 32 (L) >39 mg/dL   VLDL Cholesterol Cal 31 5 - 40 mg/dL   LDL Chol Calc (NIH) 45 0 - 99 mg/dL   Chol/HDL Ratio 3.4 0.0 - 5.0 ratio    Comment:                                   T. Chol/HDL Ratio                                             Men  Women                               1/2 Avg.Risk  3.4    3.3  Avg.Risk  5.0    4.4                                2X Avg.Risk  9.6    7.1                                3X Avg.Risk 23.4   11.0     Assessment/Plan:  Hypertension  blood pressure control important in reducing the progression of atherosclerotic disease. On appropriate oral medications.   T2DM (type 2 diabetes mellitus) (HCC) blood glucose control important in reducing the progression of atherosclerotic disease. Also, involved in wound healing. On appropriate medications.   Numbness and tingling of both feet We performed noninvasive studies today to evaluate his arterial perfusion.  He had normal triphasic waveforms and ABIs of 1.2 bilaterally.  Digit pressures and waveforms were normal throughout all 5 digits in both lower extremities.  No significant arterial insufficiency is present.   It would appear as if the screening study was incorrect as a more formal study shows this to be completely normal.  No evidence of arterial insufficiency but I discussed that it is important to have any abnormal findings more thoroughly assessed such as this but was still worth doing the procedure.  He voices his understanding.  He can return to clinic as needed.      Leotis Pain 12/25/2019, 10:02 AM   This note was created with Dragon medical transcription  system.  Any errors from dictation are unintentional.

## 2019-12-25 NOTE — Patient Instructions (Signed)

## 2019-12-25 NOTE — Assessment & Plan Note (Signed)
blood pressure control important in reducing the progression of atherosclerotic disease. On appropriate oral medications.  

## 2019-12-25 NOTE — Assessment & Plan Note (Signed)
blood glucose control important in reducing the progression of atherosclerotic disease. Also, involved in wound healing. On appropriate medications.  

## 2020-01-24 DIAGNOSIS — H2513 Age-related nuclear cataract, bilateral: Secondary | ICD-10-CM | POA: Diagnosis not present

## 2020-01-24 DIAGNOSIS — E119 Type 2 diabetes mellitus without complications: Secondary | ICD-10-CM | POA: Diagnosis not present

## 2020-01-24 LAB — HM DIABETES EYE EXAM

## 2020-01-26 ENCOUNTER — Other Ambulatory Visit: Payer: Self-pay | Admitting: Family Medicine

## 2020-01-26 DIAGNOSIS — E1165 Type 2 diabetes mellitus with hyperglycemia: Secondary | ICD-10-CM

## 2020-01-30 ENCOUNTER — Encounter: Payer: Self-pay | Admitting: Family Medicine

## 2020-02-07 ENCOUNTER — Other Ambulatory Visit: Payer: Self-pay | Admitting: Family Medicine

## 2020-02-07 DIAGNOSIS — E119 Type 2 diabetes mellitus without complications: Secondary | ICD-10-CM

## 2020-03-10 ENCOUNTER — Other Ambulatory Visit: Payer: Self-pay

## 2020-03-10 ENCOUNTER — Encounter: Payer: Self-pay | Admitting: Family Medicine

## 2020-03-10 ENCOUNTER — Ambulatory Visit (INDEPENDENT_AMBULATORY_CARE_PROVIDER_SITE_OTHER): Payer: Medicare Other | Admitting: Family Medicine

## 2020-03-10 VITALS — BP 126/76 | HR 63 | Temp 98.6°F | Ht 69.5 in | Wt 228.0 lb

## 2020-03-10 DIAGNOSIS — Z7689 Persons encountering health services in other specified circumstances: Secondary | ICD-10-CM

## 2020-03-10 DIAGNOSIS — I2510 Atherosclerotic heart disease of native coronary artery without angina pectoris: Secondary | ICD-10-CM | POA: Diagnosis not present

## 2020-03-10 DIAGNOSIS — K219 Gastro-esophageal reflux disease without esophagitis: Secondary | ICD-10-CM

## 2020-03-10 DIAGNOSIS — J449 Chronic obstructive pulmonary disease, unspecified: Secondary | ICD-10-CM

## 2020-03-10 DIAGNOSIS — I10 Essential (primary) hypertension: Secondary | ICD-10-CM

## 2020-03-10 DIAGNOSIS — E1169 Type 2 diabetes mellitus with other specified complication: Secondary | ICD-10-CM | POA: Diagnosis not present

## 2020-03-10 DIAGNOSIS — G4733 Obstructive sleep apnea (adult) (pediatric): Secondary | ICD-10-CM | POA: Diagnosis not present

## 2020-03-10 DIAGNOSIS — E785 Hyperlipidemia, unspecified: Secondary | ICD-10-CM

## 2020-03-10 DIAGNOSIS — J4489 Other specified chronic obstructive pulmonary disease: Secondary | ICD-10-CM

## 2020-03-10 DIAGNOSIS — G4701 Insomnia due to medical condition: Secondary | ICD-10-CM

## 2020-03-10 DIAGNOSIS — E1142 Type 2 diabetes mellitus with diabetic polyneuropathy: Secondary | ICD-10-CM

## 2020-03-10 LAB — BAYER DCA HB A1C WAIVED: HB A1C (BAYER DCA - WAIVED): 6.4 % (ref <7.0)

## 2020-03-10 MED ORDER — METFORMIN HCL 1000 MG PO TABS
1000.0000 mg | ORAL_TABLET | Freq: Two times a day (BID) | ORAL | 1 refills | Status: DC
Start: 2020-03-10 — End: 2020-04-07

## 2020-03-10 MED ORDER — QUETIAPINE FUMARATE 50 MG PO TABS: 50.0000 mg | ORAL_TABLET | Freq: Every day | ORAL | 0 refills | Status: DC

## 2020-03-10 NOTE — Assessment & Plan Note (Signed)
Stable and well controlled on lipitor, continue current regimen

## 2020-03-10 NOTE — Assessment & Plan Note (Signed)
BPs stable and under good control, continue current regimen 

## 2020-03-10 NOTE — Progress Notes (Signed)
BP 126/76   Pulse 63   Temp 98.6 F (37 C) (Oral)   Ht 5' 9.5" (1.765 m)   Wt 228 lb (103.4 kg)   SpO2 98%   BMI 33.19 kg/m    Subjective:    Patient ID: Juan Gist., male    DOB: 1946-03-19, 74 y.o.   MRN: [id number]  HPI: Juan Wayner. is a 74 y.o. male  Chief Complaint  Patient presents with  . Establish Care  . Diabetes    pt states that his sugar checks has been reading high lately. pt states that he took an extra metformin pill yesterday.   Here today to establish care.   DM - Notes home BSs have been running higher than usual the past week or so. 180s-low 200s fasting in the mornings. Does not follow a strict diet and does not exercise. Denies side effects, low blood sugar spells.   OSA - can't tolerate CPAP device, was put on trazodone to help him stay asleep but not benefiting with this. Used to be on [place] which did seem to help.   HTN - on clonidine, telmisartan and notes BPs seem to run 120s/70s consistently. Denies CP, SOB, HAs, dizziness.   Taking 3 gabapentin daily for neuropathy/foot numbness which does help but still has occasional pains, particularly in left foot.   Relevant past medical, surgical, family and social history reviewed and updated as indicated. Interim medical history since our last visit reviewed. Allergies and medications reviewed and updated.  Review of Systems  Per HPI unless specifically indicated above     Objective:    BP 126/76   Pulse 63   Temp 98.6 F (37 C) (Oral)   Ht 5' 9.5" (1.765 m)   Wt 228 lb (103.4 kg)   SpO2 98%   BMI 33.19 kg/m   Wt Readings from Last 3 Encounters:  03/10/20 228 lb (103.4 kg)  12/25/19 220 lb (99.8 kg)  12/17/19 225 lb (102.1 kg)    Physical Exam Vitals and nursing note reviewed.  Constitutional:      Appearance: Normal appearance.  HENT:     Head: Atraumatic.  Eyes:     Extraocular Movements: Extraocular movements intact.     Conjunctiva/sclera: Conjunctivae  normal.  Cardiovascular:     Rate and Rhythm: Normal rate and regular rhythm.  Pulmonary:     Effort: Pulmonary effort is normal.     Breath sounds: Normal breath sounds.  Musculoskeletal:        General: Normal range of motion.     Cervical back: Normal range of motion and neck supple.  Skin:    General: Skin is warm and dry.  Neurological:     General: No focal deficit present.     Mental Status: He is oriented to person, place, and time.  Psychiatric:        Mood and Affect: Mood normal.        Thought Content: Thought content normal.        Judgment: Judgment normal.     Results for orders placed or performed in visit on 01/30/20  HM DIABETES EYE EXAM  Result Value Ref Range   HM Diabetic Eye Exam No Retinopathy No Retinopathy      Assessment & Plan:   Problem List Items Addressed This Visit      Cardiovascular and Mediastinum   Hypertension    BPs stable and under good control, continue current regimen  Atherosclerosis of coronary artery    Stable lipids, continue aggressive med and lifestyle management        Respiratory   Obstructive sleep apnea    Intolerant to CPAP, work on maintaining health weight, d/c trazodone and trial seroquel to help with sleep      COPD with chronic bronchitis and emphysema (HCC)    Stable off inhalers, continue to monitor        Digestive   GERD (gastroesophageal reflux disease)    Stable on prilosec, continue current regimen        Endocrine   T2DM (type 2 diabetes mellitus) ([place]) - Primary    A1C today up a bit to 6.3, but still at goal. Will increase metformin to 1000 mg BID, switch to standard release as the XR tabs are too big for him to swallow well (notes his wifes tabs are smaller). Work on diet and exercise additionally.       Relevant Medications   metFORMIN (GLUCOPHAGE) 1000 MG tablet   Other Relevant Orders   HgB A1c   Bayer DCA Hb A1c Waived   Hyperlipidemia associated with type 2 diabetes mellitus  ([place])    Stable and well controlled on lipitor, continue current regimen      Relevant Medications   metFORMIN (GLUCOPHAGE) 1000 MG tablet   Diabetic peripheral neuropathy (HCC)    Stable on gabapentin, continue current regimen      Relevant Medications   QUEtiapine (SEROQUEL) 50 MG tablet   metFORMIN (GLUCOPHAGE) 1000 MG tablet     Other   Insomnia    D/c trazodone, trial seroquel and good sleep hygiene reviewed       Other Visit Diagnoses    Encounter to establish care           Follow up plan: Return in about 4 weeks (around 04/07/2020) for Sleep f/u.

## 2020-03-10 NOTE — Assessment & Plan Note (Signed)
Stable on gabapentin, continue current regimen

## 2020-03-10 NOTE — Assessment & Plan Note (Signed)
Stable on prilosec, continue current regimen 

## 2020-03-10 NOTE — Assessment & Plan Note (Signed)
A1C today up a bit to 6.3, but still at goal. Will increase metformin to 1000 mg BID, switch to standard release as the XR tabs are too big for him to swallow well (notes his wifes tabs are smaller). Work on diet and exercise additionally.

## 2020-03-10 NOTE — Assessment & Plan Note (Signed)
Stable off inhalers, continue to monitor 

## 2020-03-10 NOTE — Assessment & Plan Note (Signed)
Stable lipids, continue aggressive med and lifestyle management

## 2020-03-10 NOTE — Assessment & Plan Note (Signed)
D/c trazodone, trial seroquel and good sleep hygiene reviewed

## 2020-03-10 NOTE — Assessment & Plan Note (Signed)
Intolerant to CPAP, work on maintaining health weight, d/c trazodone and trial seroquel to help with sleep

## 2020-03-18 ENCOUNTER — Ambulatory Visit: Payer: Self-pay | Admitting: Family Medicine

## 2020-04-03 ENCOUNTER — Other Ambulatory Visit: Payer: Self-pay | Admitting: Family Medicine

## 2020-04-03 DIAGNOSIS — E119 Type 2 diabetes mellitus without complications: Secondary | ICD-10-CM

## 2020-04-03 NOTE — Telephone Encounter (Signed)
Requested medication (s) are due for refill today: yes  Requested medication (s) are on the active medication list: yes    Future visit scheduled: yes  Notes to clinic:  looks like patient has changed pcp office  Please review to send meter in    Requested Prescriptions  Pending Prescriptions Disp Refills   Blood Glucose Monitoring Suppl ([place]) w/Device KIT [Pharmacy Med Name: ONE KIT  TOUCH VERIO FLEX KIT]      Sig: USE UP TO 4 TIMES DAILY AS  DIRECTED      Endocrinology: Diabetes - Testing Supplies Passed - 04/03/2020  2:00 PM      Passed - Valid encounter within last 12 months    Recent Outpatient Visits           3 weeks ago Type 2 diabetes mellitus with other specified complication, without long-term current use of insulin ([place])   [place], Lilia Argue, [place]   3 months ago Encounter for annual physical exam   [facility] [facility], Dionne Bucy, [place]   4 months ago Essential hypertension   [facility] [place], Dionne Bucy, [place]   8 months ago Type 2 diabetes mellitus with other specified complication, without long-term current use of insulin [facility])   [place], Dionne Bucy, [place]   9 months ago Type 2 diabetes mellitus with other specified complication, without long-term current use of insulin [facility])   [facility], Dionne Bucy, [place]       Future Appointments             In 4 days Orene Desanctis, Lilia Argue, PA-C [facility]

## 2020-04-04 NOTE — Telephone Encounter (Signed)
Patient last seen 03/10/20 and has appointment 04/07/20

## 2020-04-07 ENCOUNTER — Encounter: Payer: Self-pay | Admitting: Family Medicine

## 2020-04-07 ENCOUNTER — Ambulatory Visit (INDEPENDENT_AMBULATORY_CARE_PROVIDER_SITE_OTHER): Payer: Medicare Other | Admitting: Family Medicine

## 2020-04-07 ENCOUNTER — Other Ambulatory Visit: Payer: Self-pay

## 2020-04-07 VITALS — BP 132/83 | HR 59 | Temp 97.8°F | Wt 226.0 lb

## 2020-04-07 DIAGNOSIS — I10 Essential (primary) hypertension: Secondary | ICD-10-CM | POA: Diagnosis not present

## 2020-04-07 DIAGNOSIS — G4701 Insomnia due to medical condition: Secondary | ICD-10-CM

## 2020-04-07 DIAGNOSIS — E785 Hyperlipidemia, unspecified: Secondary | ICD-10-CM

## 2020-04-07 DIAGNOSIS — M792 Neuralgia and neuritis, unspecified: Secondary | ICD-10-CM

## 2020-04-07 MED ORDER — TELMISARTAN 40 MG PO TABS: 40.0000 mg | ORAL_TABLET | Freq: Every day | ORAL | 3 refills | Status: DC

## 2020-04-07 MED ORDER — QUETIAPINE FUMARATE 100 MG PO TABS
100.0000 mg | ORAL_TABLET | Freq: Every day | ORAL | 0 refills | Status: DC
Start: 2020-04-07 — End: 2020-04-07

## 2020-04-07 MED ORDER — GABAPENTIN 400 MG PO CAPS: ORAL_CAPSULE | ORAL | 3 refills | Status: DC

## 2020-04-07 MED ORDER — METFORMIN HCL 1000 MG PO TABS: 1000.0000 mg | ORAL_TABLET | Freq: Two times a day (BID) | ORAL | 1 refills | Status: DC

## 2020-04-07 MED ORDER — CLONIDINE HCL 0.1 MG PO TABS: 0.1000 mg | ORAL_TABLET | Freq: Two times a day (BID) | ORAL | 3 refills | Status: DC

## 2020-04-07 MED ORDER — QUETIAPINE FUMARATE 100 MG PO TABS: 100.0000 mg | ORAL_TABLET | Freq: Every day | ORAL | 1 refills | Status: DC

## 2020-04-07 MED ORDER — ATORVASTATIN CALCIUM 40 MG PO TABS: ORAL_TABLET | ORAL | 3 refills | Status: DC

## 2020-04-07 NOTE — Progress Notes (Signed)
BP 132/83   Pulse (!) 59   Temp 97.8 F (36.6 C) (Oral)   Wt 226 lb (102.5 kg)   SpO2 99%   BMI 32.90 kg/m    Subjective:    Patient ID: Juan Gist., male    DOB: 08/31/1946, 74 y.o.   MRN: [id number]  HPI: Juan Bacci. is a 74 y.o. male  Chief Complaint  Patient presents with  . Insomnia   Presenting today for 1 month sleep f/u after being transitioned from trazodone to seroquel for insomnia. Notes improvement, but still waking about twice at night. No morning grogginess or other side effects noted. Denies any new concerns today.   Relevant past medical, surgical, family and social history reviewed and updated as indicated. Interim medical history since our last visit reviewed. Allergies and medications reviewed and updated.  Review of Systems  Per HPI unless specifically indicated above     Objective:    BP 132/83   Pulse (!) 59   Temp 97.8 F (36.6 C) (Oral)   Wt 226 lb (102.5 kg)   SpO2 99%   BMI 32.90 kg/m   Wt Readings from Last 3 Encounters:  04/07/20 226 lb (102.5 kg)  03/10/20 228 lb (103.4 kg)  12/25/19 220 lb (99.8 kg)    Physical Exam Vitals and nursing note reviewed.  Constitutional:      Appearance: Normal appearance.  HENT:     Head: Atraumatic.  Eyes:     Extraocular Movements: Extraocular movements intact.     Conjunctiva/sclera: Conjunctivae normal.  Cardiovascular:     Rate and Rhythm: Normal rate and regular rhythm.  Pulmonary:     Effort: Pulmonary effort is normal.     Breath sounds: Normal breath sounds.  Musculoskeletal:        General: Normal range of motion.     Cervical back: Normal range of motion and neck supple.  Skin:    General: Skin is warm and dry.  Neurological:     General: No focal deficit present.     Mental Status: He is oriented to person, place, and time.  Psychiatric:        Mood and Affect: Mood normal.        Thought Content: Thought content normal.        Judgment: Judgment normal.       Results for orders placed or performed in visit on 03/10/20  Bayer DCA Hb A1c Waived  Result Value Ref Range   HB A1C (BAYER DCA - WAIVED) 6.4 <7.0 %      Assessment & Plan:   Problem List Items Addressed This Visit      Cardiovascular and Mediastinum   Hypertension   Relevant Medications   telmisartan (MICARDIS) 40 MG tablet   cloNIDine (CATAPRES) 0.1 MG tablet   atorvastatin (LIPITOR) 40 MG tablet     Other   Insomnia - Primary    Increase seroquel to 100 mg nightly as needed and continue to monitor. F/u if this is not a satisfactory dose prior to next scheduled f/u       Other Visit Diagnoses    Neuropathic pain       Relevant Medications   gabapentin (NEURONTIN) 400 MG capsule   Dyslipidemia       Relevant Medications   atorvastatin (LIPITOR) 40 MG tablet       Follow up plan: Return in about 6 months (around 10/07/2020) for 6 month f/u.

## 2020-04-07 NOTE — Assessment & Plan Note (Signed)
Increase seroquel to 100 mg nightly as needed and continue to monitor. F/u if this is not a satisfactory dose prior to next scheduled f/u

## 2020-06-06 ENCOUNTER — Encounter: Payer: Self-pay | Admitting: Nurse Practitioner

## 2020-06-11 ENCOUNTER — Ambulatory Visit (INDEPENDENT_AMBULATORY_CARE_PROVIDER_SITE_OTHER): Payer: Medicare Other | Admitting: Nurse Practitioner

## 2020-06-11 ENCOUNTER — Encounter: Payer: Self-pay | Admitting: Nurse Practitioner

## 2020-06-11 ENCOUNTER — Other Ambulatory Visit: Payer: Self-pay

## 2020-06-11 VITALS — BP 130/82 | HR 77 | Temp 98.6°F | Wt 233.0 lb

## 2020-06-11 DIAGNOSIS — E1159 Type 2 diabetes mellitus with other circulatory complications: Secondary | ICD-10-CM | POA: Diagnosis not present

## 2020-06-11 DIAGNOSIS — G4733 Obstructive sleep apnea (adult) (pediatric): Secondary | ICD-10-CM | POA: Diagnosis not present

## 2020-06-11 DIAGNOSIS — E785 Hyperlipidemia, unspecified: Secondary | ICD-10-CM | POA: Diagnosis not present

## 2020-06-11 DIAGNOSIS — E1169 Type 2 diabetes mellitus with other specified complication: Secondary | ICD-10-CM

## 2020-06-11 DIAGNOSIS — Z6833 Body mass index (BMI) 33.0-33.9, adult: Secondary | ICD-10-CM

## 2020-06-11 DIAGNOSIS — E669 Obesity, unspecified: Secondary | ICD-10-CM

## 2020-06-11 DIAGNOSIS — E1142 Type 2 diabetes mellitus with diabetic polyneuropathy: Secondary | ICD-10-CM

## 2020-06-11 DIAGNOSIS — I152 Hypertension secondary to endocrine disorders: Secondary | ICD-10-CM

## 2020-06-11 DIAGNOSIS — G4701 Insomnia due to medical condition: Secondary | ICD-10-CM | POA: Diagnosis not present

## 2020-06-11 DIAGNOSIS — I10 Essential (primary) hypertension: Secondary | ICD-10-CM | POA: Diagnosis not present

## 2020-06-11 LAB — MICROALBUMIN, URINE WAIVED
Creatinine, Urine Waived: 200 mg/dL (ref 10–300)
Microalb, Ur Waived: 30 mg/L — ABNORMAL HIGH (ref 0–19)
Microalb/Creat Ratio: <30 mg/g (ref <30)

## 2020-06-11 LAB — BAYER DCA HB A1C WAIVED: HB A1C (BAYER DCA - WAIVED): 6.4 % (ref <7.0)

## 2020-06-11 MED ORDER — QUETIAPINE FUMARATE 100 MG PO TABS: 150.0000 mg | ORAL_TABLET | Freq: Every day | ORAL | 4 refills | Status: DC

## 2020-06-11 NOTE — Assessment & Plan Note (Signed)
Chronic, ongoing.  Recommend he trial a new CPAP or refer to pulmonary, does not wish to pursue at this time.  Discussed at length OSA with him and risks. 

## 2020-06-11 NOTE — Assessment & Plan Note (Signed)
Chronic, ongoing with BP at goal today.  Continue current medication with Telmisartan as kidney protection in diabetes.  Recommend he monitor BP at least 3 days a week at home and document for provider + focus on DASH diet.  BMP today.  Return in 3 months.

## 2020-06-11 NOTE — Patient Instructions (Signed)

## 2020-06-11 NOTE — Assessment & Plan Note (Signed)
Chronic, ongoing.  Recommend he trial a new CPAP or refer to pulmonary, does not wish to pursue at this time.  Discussed at length OSA with him and risks.  Will increase Seroquel to 150 MG at this time, educated on risks/benefits medication.  Recommend he trial Melatonin.  Return in 3 months.

## 2020-06-11 NOTE — Assessment & Plan Note (Signed)
Chronic, ongoing with A1C today 6.4%.  Urine ALB 30 and A:C <30 today.  Continue current regimen and adjust as needed.  Continue Telmisartan for kidney protection.  Recommend to continue to check BS at home daily and document, bring to visits.  Continue Gabapentin and monitoring feet at home.  Foot exam done today.  Check B12 next visit.  Return in 3 months.

## 2020-06-11 NOTE — Assessment & Plan Note (Signed)
Recommended eating smaller high protein, low fat meals more frequently and exercising 30 mins a day 5 times a week with a goal of 10-15lb weight loss in the next 3 months. Patient voiced their understanding and motivation to adhere to these recommendations.  

## 2020-06-11 NOTE — Assessment & Plan Note (Signed)
Chronic, ongoing.  Continue current medication regimen and adjust as needed. Lipid panel today. 

## 2020-06-11 NOTE — Progress Notes (Signed)
BP 130/82   Pulse 77   Temp 98.6 F (37 C) (Oral)   Wt 233 lb (105.7 kg)   SpO2 96%   BMI 33.91 kg/m    Subjective:    Patient ID: Juan Gist., male    DOB: [birth year], 74 y.o.   MRN: [id number]  HPI: Juan Canal. is a 74 y.o. male  Chief Complaint  Patient presents with  . Diabetes  . Hypertension  . Insomnia    pt states the seroquesl is not working well, would like to increase it   DIABETES Last A1C in May was 6.4%.  Continues on Metformin 1000 MG BID and Gabapentin for neuropathy discomfort.   Hypoglycemic episodes:no Polydipsia/polyuria: no Visual disturbance: no Chest pain: no Paresthesias: no Glucose Monitoring: yes  Accucheck frequency: Daily  Fasting glucose: 140-150  Post prandial:  Evening:  Before meals: Taking Insulin?: no  Long acting insulin:  Short acting insulin: Blood Pressure Monitoring: rarely Retinal Examination: Up to Date Foot Exam: Up to Date Pneumovax: Up to Date Influenza: Up to Date Aspirin: yes   HYPERTENSION / HYPERLIPIDEMIA Currently taking Telmisartan 40 MG daily, Clonidine 0.1 MG BID, ASA, and Atorvastatin 40 MG daily.  Last LDL 45 in February.  Never a smoker.   Satisfied with current treatment? yes Duration of hypertension: chronic BP monitoring frequency: rarely BP range:  BP medication side effects: no Duration of hyperlipidemia: chronic Cholesterol medication side effects: no Cholesterol supplements: none Medication compliance: good compliance Aspirin: yes Recent stressors: no Recurrent headaches: no Visual changes: no Palpitations: no Dyspnea: no Chest pain: no Lower extremity edema: no Dizzy/lightheaded: no  INSOMNIA Currently on Seroquel 100 MG at bedtime -- started this months back.  Tried Ambien in past, but was taken off this.  Discuss with him due to age >66 this is not recommended.  Tried Trazodone in past with no benefit.  Has not tried Melatonin.  Reports having "bad sleep apnea" --  does not tolerate CPAP -- has not used one in 4-5 months -- does not like air blowing up in nose.    Pt is aware of risks of psychoactive medication use to include increased sedation, respiratory suppression, falls, extrapyramidal movements,  dependence and cardiovascular events.  Pt would like to continue treatment as benefit determined to outweigh risk.   Duration: chronic Satisfied with sleep quality: no Difficulty falling asleep: no Difficulty staying asleep: yes Waking a few hours after sleep onset: yes Early morning awakenings: no Daytime hypersomnolence: no Wakes feeling refreshed: no Good sleep hygiene: yes Apnea: yes Snoring: yes Depressed/anxious mood: no Recent stress: no Restless legs/nocturnal leg cramps: no Chronic pain/arthritis: no History of sleep study: yes -- 2 sleep studies Treatments attempted: [place]   Relevant past medical, surgical, family and social history reviewed and updated as indicated. Interim medical history since our last visit reviewed. Allergies and medications reviewed and updated.  Review of Systems  Constitutional: Negative for activity change, diaphoresis, fatigue and fever.  Respiratory: Negative for cough, chest tightness, shortness of breath and wheezing.   Cardiovascular: Negative for chest pain, palpitations and leg swelling.  Gastrointestinal: Negative.   Endocrine: Negative for polydipsia, polyphagia and polyuria.  Neurological: Negative.   Psychiatric/Behavioral: Positive for sleep disturbance. Negative for decreased concentration, self-injury and suicidal ideas. The patient is not nervous/anxious.     Per HPI unless specifically indicated above     Objective:    BP 130/82   Pulse 77   Temp 98.6 F (37  C) (Oral)   Wt 233 lb (105.7 kg)   SpO2 96%   BMI 33.91 kg/m   Wt Readings from Last 3 Encounters:  06/11/20 233 lb (105.7 kg)  04/07/20 226 lb (102.5 kg)  03/10/20 228 lb (103.4 kg)    Physical Exam Vitals and nursing  note reviewed.  Constitutional:      General: He is awake. He is not in acute distress.    Appearance: He is well-developed and well-groomed. He is obese. He is not ill-appearing.  HENT:     Head: Normocephalic and atraumatic.     Right Ear: Hearing normal. No drainage.     Left Ear: Hearing normal. No drainage.  Eyes:     General: Lids are normal.        Right eye: No discharge.        Left eye: No discharge.     Conjunctiva/sclera: Conjunctivae normal.     Pupils: Pupils are equal, round, and reactive to light.  Neck:     Thyroid: No thyromegaly.     Vascular: No carotid bruit.     Trachea: Trachea normal.  Cardiovascular:     Rate and Rhythm: Normal rate and regular rhythm.     Pulses:          Dorsalis pedis pulses are 1+ on the right side and 1+ on the left side.       Posterior tibial pulses are 1+ on the right side and 1+ on the left side.     Heart sounds: Normal heart sounds, S1 normal and S2 normal. No murmur heard.  No gallop.   Pulmonary:     Effort: Pulmonary effort is normal. No accessory muscle usage or respiratory distress.     Breath sounds: Normal breath sounds.  Abdominal:     General: Bowel sounds are normal.     Palpations: Abdomen is soft.  Musculoskeletal:        General: Normal range of motion.     Cervical back: Normal range of motion and neck supple.     Right lower leg: No edema.     Left lower leg: No edema.  Feet:     Right foot:     Protective Sensation: 6 sites tested. 10 sites sensed.     Skin integrity: Dry skin present.     Toenail Condition: Right toenails are abnormally thick.     Left foot:     Protective Sensation: 6 sites tested. 10 sites sensed.     Skin integrity: Dry skin present.     Toenail Condition: Left toenails are abnormally thick.  Skin:    General: Skin is warm and dry.     Capillary Refill: Capillary refill takes less than 2 seconds.  Neurological:     Mental Status: He is alert and oriented to person, place, and  time.     Deep Tendon Reflexes: Reflexes are normal and symmetric.  Psychiatric:        Attention and Perception: Attention normal.        Mood and Affect: Mood normal.        Speech: Speech normal.        Behavior: Behavior normal. Behavior is cooperative.        Thought Content: Thought content normal.    Results for orders placed or performed in visit on 03/10/20  Bayer DCA Hb A1c Waived  Result Value Ref Range   HB A1C (BAYER DCA - WAIVED) 6.4 <7.0 %  Assessment & Plan:   Problem List Items Addressed This Visit      Cardiovascular and Mediastinum   Hypertension associated with diabetes ([place])    Chronic, ongoing with BP at goal today.  Continue current medication with Telmisartan as kidney protection in diabetes.  Recommend he monitor BP at least 3 days a week at home and document for provider + focus on DASH diet.  BMP today.  Return in 3 months.      Relevant Orders   Bayer DCA Hb A1c Waived   Basic metabolic panel   Microalbumin, Urine Waived     Respiratory   Obstructive sleep apnea    Chronic, ongoing.  Recommend he trial a new CPAP or refer to pulmonary, does not wish to pursue at this time.  Discussed at length OSA with him and risks.        Endocrine   Type 2 diabetes mellitus with peripheral neuropathy (HCC) - Primary    Chronic, ongoing with A1C today 6.4%.  Urine ALB 30 and A:C <30 today.  Continue current regimen and adjust as needed.  Continue Telmisartan for kidney protection.  Recommend to continue to check BS at home daily and document, bring to visits.  Continue Gabapentin and monitoring feet at home.  Foot exam done today.  Check B12 next visit.  Return in 3 months.      Relevant Medications   QUEtiapine (SEROQUEL) 100 MG tablet   Other Relevant Orders   Bayer DCA Hb A1c Waived   Hyperlipidemia associated with type 2 diabetes mellitus (HCC)    Chronic, ongoing.  Continue current medication regimen and adjust as needed.  Lipid panel today.           Relevant Orders   Bayer DCA Hb A1c Waived   Lipid Panel w/o Chol/HDL Ratio     Other   Insomnia    Chronic, ongoing.  Recommend he trial a new CPAP or refer to pulmonary, does not wish to pursue at this time.  Discussed at length OSA with him and risks.  Will increase Seroquel to 150 MG at this time, educated on risks/benefits medication.  Recommend he trial Melatonin.  Return in 3 months.      Obesity    Recommended eating smaller high protein, low fat meals more frequently and exercising 30 mins a day 5 times a week with a goal of 10-15lb weight loss in the next 3 months. Patient voiced their understanding and motivation to adhere to these recommendations.           Follow up plan: Return in about 3 months (around 09/11/2020) for T2DM, HTN/HLD, INSOMNIA.

## 2020-06-12 LAB — BASIC METABOLIC PANEL
BUN/Creatinine Ratio: 20 (ref 10–24)
BUN: 20 mg/dL (ref 8–27)
CO2: 22 mmol/L (ref 20–29)
Calcium: 9.1 mg/dL (ref 8.6–10.2)
Chloride: 99 mmol/L (ref 96–106)
Creatinine, Ser: 1.02 mg/dL (ref 0.76–1.27)
GFR calc Af Amer: 83 mL/min/{1.73_m2} (ref >59)
GFR calc non Af Amer: 72 mL/min/{1.73_m2} (ref >59)
Glucose: 243 mg/dL — ABNORMAL HIGH (ref 65–99)
Potassium: 4.4 mmol/L (ref 3.5–5.2)
Sodium: 137 mmol/L (ref 134–144)

## 2020-06-12 LAB — LIPID PANEL W/O CHOL/HDL RATIO
Cholesterol, Total: 139 mg/dL (ref 100–199)
HDL: 28 mg/dL — ABNORMAL LOW (ref >39)
LDL Chol Calc (NIH): 53 mg/dL (ref 0–99)
Triglycerides: 383 mg/dL — ABNORMAL HIGH (ref 0–149)
VLDL Cholesterol Cal: 58 mg/dL — ABNORMAL HIGH (ref 5–40)

## 2020-06-12 NOTE — Progress Notes (Signed)
Contacted via MyChart  Good afternoon Juan Le, your labs have returned.  Glucose was a bit elevated on these labs, continue medications and monitoring diet.  Cholesterol levels, LDL is at goal, but triglycerides are elevated.  Did you eat before visit?  If so would like to recheck these fasting next visit and if still high may add on medication that focused specifically on these.  Have a great day!! Keep being awesome!!  Thank you for allowing me to participate in your care. Kindest regards, [place] Mikes

## 2020-07-05 ENCOUNTER — Other Ambulatory Visit: Payer: Self-pay | Admitting: Family Medicine

## 2020-07-05 NOTE — Telephone Encounter (Signed)
Requested Prescriptions  Pending Prescriptions Disp Refills  . metFORMIN (GLUCOPHAGE) 1000 MG tablet [Pharmacy Med Name: metFORMIN HCl 1000 MG Oral Tablet] 180 tablet 3    Sig: TAKE 1 TABLET BY MOUTH  TWICE DAILY WITH MEALS     Endocrinology:  Diabetes - Biguanides Passed - 07/05/2020  3:48 PM      Passed - Cr in normal range and within 360 days    Creat  Date Value Ref Range Status  11/29/2017 1.03 0.70 - 1.18 mg/dL Final    Comment:    For patients >31 years of age, the reference limit for Creatinine is approximately 13% higher for people identified as African-American. .    Creatinine, Ser  Date Value Ref Range Status  06/11/2020 1.02 0.76 - 1.27 mg/dL Final   Creatinine, Urine  Date Value Ref Range Status  04/21/2017 206 20 - 370 mg/dL Final         Passed - HBA1C is between 0 and 7.9 and within 180 days    HB A1C (BAYER DCA - WAIVED)  Date Value Ref Range Status  06/11/2020 6.4 <7.0 % Final    Comment:                                          Diabetic Adult            <7.0                                       Healthy Adult        4.3 - 5.7                                                           (DCCT/NGSP) American Diabetes Association's Summary of Glycemic Recommendations for Adults with Diabetes: Hemoglobin A1c <7.0%. More stringent glycemic goals (A1c <6.0%) may further reduce complications at the cost of increased risk of hypoglycemia.          Passed - eGFR in normal range and within 360 days    GFR, Est African American  Date Value Ref Range Status  04/21/2017 86 >=60 mL/min Final   GFR calc Af Amer  Date Value Ref Range Status  06/11/2020 83 >59 mL/min/1.73 Final    Comment:    **[facility] currently reports eGFR in compliance with the current**   recommendations of the [employer]. [facility] will   update reporting as new guidelines are published from the NKF-ASN   Task force.    GFR, Est Non African American  Date Value Ref Range  Status  04/21/2017 74 >=60 mL/min Final   GFR calc non Af Amer  Date Value Ref Range Status  06/11/2020 72 >59 mL/min/1.73 Final         Passed - Valid encounter within last 6 months    Recent Outpatient Visits          3 weeks ago Type 2 diabetes mellitus with peripheral neuropathy ([place])   Juan Le, Juan Le, Juan Le   2 months ago Insomnia due to medical condition   [facility]  Benjamine Mola, PA-C   3 months ago Type 2 diabetes mellitus with other specified complication, without long-term current use of insulin [facility])   [place], [place], [place]   6 months ago Encounter for annual physical exam   [facility] [place], Dionne Bucy, MD   7 months ago Essential hypertension   [facility] [place], Dionne Bucy, MD      Future Appointments            In 2 months [place], Barbaraann Faster, Juan Le [employer], [place]

## 2020-08-31 ENCOUNTER — Encounter: Payer: Self-pay | Admitting: Nurse Practitioner

## 2020-08-31 DIAGNOSIS — I700 Atherosclerosis of aorta: Secondary | ICD-10-CM | POA: Insufficient documentation

## 2020-09-04 ENCOUNTER — Other Ambulatory Visit: Payer: Self-pay

## 2020-09-04 ENCOUNTER — Encounter: Payer: Self-pay | Admitting: Nurse Practitioner

## 2020-09-04 ENCOUNTER — Ambulatory Visit (INDEPENDENT_AMBULATORY_CARE_PROVIDER_SITE_OTHER): Payer: Medicare Other | Admitting: Nurse Practitioner

## 2020-09-04 VITALS — BP 120/68 | HR 71 | Wt 231.0 lb

## 2020-09-04 DIAGNOSIS — E1159 Type 2 diabetes mellitus with other circulatory complications: Secondary | ICD-10-CM

## 2020-09-04 DIAGNOSIS — E1142 Type 2 diabetes mellitus with diabetic polyneuropathy: Secondary | ICD-10-CM | POA: Diagnosis not present

## 2020-09-04 DIAGNOSIS — E1169 Type 2 diabetes mellitus with other specified complication: Secondary | ICD-10-CM | POA: Diagnosis not present

## 2020-09-04 DIAGNOSIS — I152 Hypertension secondary to endocrine disorders: Secondary | ICD-10-CM

## 2020-09-04 DIAGNOSIS — G4733 Obstructive sleep apnea (adult) (pediatric): Secondary | ICD-10-CM

## 2020-09-04 DIAGNOSIS — E669 Obesity, unspecified: Secondary | ICD-10-CM

## 2020-09-04 DIAGNOSIS — J449 Chronic obstructive pulmonary disease, unspecified: Secondary | ICD-10-CM | POA: Diagnosis not present

## 2020-09-04 DIAGNOSIS — I700 Atherosclerosis of aorta: Secondary | ICD-10-CM

## 2020-09-04 DIAGNOSIS — J4489 Other specified chronic obstructive pulmonary disease: Secondary | ICD-10-CM

## 2020-09-04 DIAGNOSIS — E785 Hyperlipidemia, unspecified: Secondary | ICD-10-CM | POA: Diagnosis not present

## 2020-09-04 DIAGNOSIS — Z6833 Body mass index (BMI) 33.0-33.9, adult: Secondary | ICD-10-CM

## 2020-09-04 DIAGNOSIS — G4701 Insomnia due to medical condition: Secondary | ICD-10-CM

## 2020-09-04 DIAGNOSIS — E538 Deficiency of other specified B group vitamins: Secondary | ICD-10-CM

## 2020-09-04 LAB — BAYER DCA HB A1C WAIVED: HB A1C (BAYER DCA - WAIVED): 7 % — ABNORMAL HIGH (ref <7.0)

## 2020-09-04 MED ORDER — QUETIAPINE FUMARATE 200 MG PO TABS: 200.0000 mg | ORAL_TABLET | Freq: Every day | ORAL | 4 refills | Status: DC

## 2020-09-04 NOTE — Assessment & Plan Note (Signed)
Chronic, ongoing with BP at goal today.  Continue current medication with Telmisartan as kidney protection in diabetes.  Recommend he monitor BP at least 3 days a week at home and document for provider + focus on DASH diet.  TSH check today.  Return in 3 months to meet new PCP.

## 2020-09-04 NOTE — Progress Notes (Signed)
BP 120/68    Pulse 71    Wt 231 lb (104.8 kg)    SpO2 97%    BMI 33.62 kg/m    Subjective:    Patient ID: Juan Le., male    DOB: 12-25-1945, 74 y.o.   MRN: [id number]  HPI: Juan Le. is a 74 y.o. male  Chief Complaint  Patient presents with   Diabetes   Insomnia   DIABETES Last A1C in August was 6.4%.  Continues on Metformin 1000 MG BID and Gabapentin for neuropathy discomfort.   Hypoglycemic episodes:no Polydipsia/polyuria: no Visual disturbance: no Chest pain: no Paresthesias: no Glucose Monitoring: yes  Accucheck frequency: Daily  Fasting glucose: 130-150  Post prandial:  Evening:  Before meals: Taking Insulin?: no  Long acting insulin:  Short acting insulin: Blood Pressure Monitoring: rarely Retinal Examination: Up to Date Foot Exam: Up to Date Pneumovax: Up to Date Influenza: Up to Date -- received at [place] Aspirin: yes   HYPERTENSION / HYPERLIPIDEMIA Currently taking Telmisartan 40 MG daily, Clonidine 0.1 MG BID, ASA, and Atorvastatin 40 MG daily.  Last LDL 53 in August.  Never a smoker.   Satisfied with current treatment? yes Duration of hypertension: chronic BP monitoring frequency: rarely BP range:  BP medication side effects: no Duration of hyperlipidemia: chronic Cholesterol medication side effects: no Cholesterol supplements: none Medication compliance: good compliance Aspirin: yes Recent stressors: no Recurrent headaches: no Visual changes: no Palpitations: no Dyspnea: no Chest pain: no Lower extremity edema: no Dizzy/lightheaded: no   COPD Has not smoked since teen years.  Does not use any inhalers at his time, was at one time but stopped.  Did have a CT scan in 2019 noting aortic atherosclerosis, educated him on this. COPD status: stable Satisfied with current treatment?: yes Oxygen use: no Dyspnea frequency: none Cough frequency: none Rescue inhaler frequency:  none Limitation of activity:  no Productive cough:  none Last Spirometry: unknown Pneumovax: Up to Date Influenza: Up to Date  INSOMNIA Currently on Seroquel 150 MG at bedtime -- started this months back -- he would like to increase this as still waking up several times.  Tried Ambien in past, but was taken off this.  Discuss with him due to age >31 this is not recommended.  Tried Trazodone in past with no benefit.  Has not tried Melatonin.  Reports having "bad sleep apnea" -- does not tolerate CPAP -- has not used one in 4-5 months.   Pt is aware of risks of psychoactive medication use to include increased sedation, respiratory suppression, falls, extrapyramidal movements,  dependence and cardiovascular events.  Pt would like to continue treatment as benefit determined to outweigh risk.   Duration: chronic Satisfied with sleep quality: no Difficulty falling asleep: no Difficulty staying asleep: yes Waking a few hours after sleep onset: yes Early morning awakenings: no Daytime hypersomnolence: no Wakes feeling refreshed: no Good sleep hygiene: yes Apnea: yes Snoring: yes Depressed/anxious mood: no Recent stress: no Restless legs/nocturnal leg cramps: no Chronic pain/arthritis: no History of sleep study: yes -- 2 sleep studies Treatments attempted: [place]   Relevant past medical, surgical, family and social history reviewed and updated as indicated. Interim medical history since our last visit reviewed. Allergies and medications reviewed and updated.  Review of Systems  Constitutional: Negative for activity change, diaphoresis, fatigue and fever.  Respiratory: Negative for cough, chest tightness, shortness of breath and wheezing.   Cardiovascular: Negative for chest pain, palpitations and leg swelling.  Gastrointestinal: Negative.   Endocrine: Negative for polydipsia, polyphagia and polyuria.  Neurological: Negative.   Psychiatric/Behavioral: Positive for sleep disturbance. Negative for decreased  concentration, self-injury and suicidal ideas. The patient is not nervous/anxious.     Per HPI unless specifically indicated above     Objective:    BP 120/68    Pulse 71    Wt 231 lb (104.8 kg)    SpO2 97%    BMI 33.62 kg/m   Wt Readings from Last 3 Encounters:  09/04/20 231 lb (104.8 kg)  06/11/20 233 lb (105.7 kg)  04/07/20 226 lb (102.5 kg)    Physical Exam Vitals and nursing note reviewed.  Constitutional:      General: He is awake. He is not in acute distress.    Appearance: He is well-developed and well-groomed. He is obese. He is not ill-appearing.  HENT:     Head: Normocephalic and atraumatic.     Right Ear: Hearing normal. No drainage.     Left Ear: Hearing normal. No drainage.  Eyes:     General: Lids are normal.        Right eye: No discharge.        Left eye: No discharge.     Conjunctiva/sclera: Conjunctivae normal.     Pupils: Pupils are equal, round, and reactive to light.  Neck:     Thyroid: No thyromegaly.     Vascular: No carotid bruit.     Trachea: Trachea normal.  Cardiovascular:     Rate and Rhythm: Normal rate and regular rhythm.     Heart sounds: Normal heart sounds, S1 normal and S2 normal. No murmur heard.  No gallop.   Pulmonary:     Effort: Pulmonary effort is normal. No accessory muscle usage or respiratory distress.     Breath sounds: Normal breath sounds.  Abdominal:     General: Bowel sounds are normal.     Palpations: Abdomen is soft.  Musculoskeletal:        General: Normal range of motion.     Cervical back: Normal range of motion and neck supple.     Right lower leg: No edema.     Left lower leg: No edema.  Skin:    General: Skin is warm and dry.     Capillary Refill: Capillary refill takes less than 2 seconds.  Neurological:     Mental Status: He is alert and oriented to person, place, and time.     Deep Tendon Reflexes: Reflexes are normal and symmetric.  Psychiatric:        Attention and Perception: Attention normal.         Mood and Affect: Mood normal.        Speech: Speech normal.        Behavior: Behavior normal. Behavior is cooperative.        Thought Content: Thought content normal.    Results for orders placed or performed in visit on 06/11/20  Bayer DCA Hb A1c Waived  Result Value Ref Range   HB A1C (BAYER DCA - WAIVED) 6.4 <8.2 %  Basic metabolic panel  Result Value Ref Range   Glucose 243 (H) 65 - 99 mg/dL   BUN 20 8 - 27 mg/dL   Creatinine, Ser 1.02 0.76 - 1.27 mg/dL   GFR calc non Af Amer 72 >59 mL/min/1.73   GFR calc Af Amer 83 >59 mL/min/1.73   BUN/Creatinine Ratio 20 10 - 24   Sodium 137 134 - 144  mmol/L   Potassium 4.4 3.5 - 5.2 mmol/L   Chloride 99 96 - 106 mmol/L   CO2 22 20 - 29 mmol/L   Calcium 9.1 8.6 - 10.2 mg/dL  Lipid Panel w/o Chol/HDL Ratio  Result Value Ref Range   Cholesterol, Total 139 100 - 199 mg/dL   Triglycerides 383 (H) 0 - 149 mg/dL   HDL 28 (L) >39 mg/dL   VLDL Cholesterol Cal 58 (H) 5 - 40 mg/dL   LDL Chol Calc (NIH) 53 0 - 99 mg/dL  Microalbumin, Urine Waived  Result Value Ref Range   Microalb, Ur Waived 30 (H) 0 - 19 mg/L   Creatinine, Urine Waived 200 10 - 300 mg/dL   Microalb/Creat Ratio <30 <30 mg/g      Assessment & Plan:   Problem List Items Addressed This Visit      Cardiovascular and Mediastinum   Hypertension associated with diabetes ([place]) - Primary    Chronic, ongoing with BP at goal today.  Continue current medication with Telmisartan as kidney protection in diabetes.  Recommend he monitor BP at least 3 days a week at home and document for provider + focus on DASH diet.  TSH check today.  Return in 3 months to meet new PCP.      Relevant Orders   Bayer DCA Hb A1c Waived   TSH   Aortic atherosclerosis ([place])    Noted on CT 03/30/2018.  Recommend continue ASA and Atorvastatin daily + continue complete cessation of smoking for prevention.  Educated him on findings.        Respiratory   Obstructive sleep apnea    Chronic, ongoing.   Recommend he trial a new CPAP or refer to pulmonary, does not wish to pursue at this time.  Discussed at length OSA with him and risks.      COPD with chronic bronchitis and emphysema (HCC)    Chronic, stable without inhalers.  History of smoking.  Plan on spirometry next visit for baseline.  Recommend he utilize CPAP at home.        Relevant Orders   CBC with Differential/Platelet     Endocrine   Type 2 diabetes mellitus with peripheral neuropathy (HCC)    Chronic, ongoing with A1C today 7%, slight trend upwards.  Urine ALB 30 and A:C <30 last visit.  Continue current regimen and adjust as needed, recommend heavy focus on diet and regular activity to avoid elevation >7%.  Continue Telmisartan for kidney protection.  Recommend to continue to check BS at home daily and document, bring to visits.  Continue Gabapentin and monitoring feet at home.  Foot exam last visit. Check B12 today.  Return in 3 months.      Relevant Medications   QUEtiapine (SEROQUEL) 200 MG tablet   Other Relevant Orders   Bayer DCA Hb A1c Waived   Hyperlipidemia associated with type 2 diabetes mellitus (HCC)    Chronic, ongoing.  Continue current medication regimen and adjust as needed.  Lipid panel today to recheck triglycerides -- may benefit from fish oil daily to help lower levels. If >500 consider addition of Vascepa.       Relevant Orders   Bayer DCA Hb A1c Waived   Lipid Panel w/o Chol/HDL Ratio     Other   Insomnia    Chronic, ongoing.  Recommend he trial a new CPAP or refer to pulmonary, does not wish to pursue at this time.  Discussed at length OSA with him and risks.  Will increase Seroquel to 200 MG at this time, educated on risks/benefits medication.  Recommend he trial Melatonin.  Return in 3 months.      Obesity    Recommended eating smaller high protein, low fat meals more frequently and exercising 30 mins a day 5 times a week with a goal of 10-15lb weight loss in the next 3 months. Patient voiced  their understanding and motivation to adhere to these recommendations.        Other Visit Diagnoses    B12 deficiency       History of low levels reported and on long term Metformin, check today and add supplement as needed.   Relevant Orders   Vitamin B12       Follow up plan: Return in about 3 months (around 12/05/2020) for T2DM, HTN/HLD, INSOMNIA -- to meet new PCP -- needs spirometry.

## 2020-09-04 NOTE — Assessment & Plan Note (Signed)
Noted on CT 03/30/2018.  Recommend continue ASA and Atorvastatin daily + continue complete cessation of smoking for prevention.  Educated him on findings. 

## 2020-09-04 NOTE — Assessment & Plan Note (Signed)
Chronic, ongoing.  Recommend he trial a new CPAP or refer to pulmonary, does not wish to pursue at this time.  Discussed at length OSA with him and risks.  Will increase Seroquel to 200 MG at this time, educated on risks/benefits medication.  Recommend he trial Melatonin.  Return in 3 months.

## 2020-09-04 NOTE — Patient Instructions (Signed)

## 2020-09-04 NOTE — Assessment & Plan Note (Addendum)
Chronic, ongoing with A1C today 7%, slight trend upwards.  Urine ALB 30 and A:C <30 last visit.  Continue current regimen and adjust as needed, recommend heavy focus on diet and regular activity to avoid elevation >7%.  Continue Telmisartan for kidney protection.  Recommend to continue to check BS at home daily and document, bring to visits.  Continue Gabapentin and monitoring feet at home.  Foot exam last visit. Check B12 today.  Return in 3 months.

## 2020-09-04 NOTE — Assessment & Plan Note (Signed)
Chronic, ongoing.  Recommend he trial a new CPAP or refer to pulmonary, does not wish to pursue at this time.  Discussed at length OSA with him and risks.

## 2020-09-04 NOTE — Assessment & Plan Note (Signed)
Chronic, stable without inhalers.  History of smoking.  Plan on spirometry next visit for baseline.  Recommend he utilize CPAP at home.

## 2020-09-04 NOTE — Assessment & Plan Note (Signed)
Recommended eating smaller high protein, low fat meals more frequently and exercising 30 mins a day 5 times a week with a goal of 10-15lb weight loss in the next 3 months. Patient voiced their understanding and motivation to adhere to these recommendations.  

## 2020-09-04 NOTE — Assessment & Plan Note (Signed)
Chronic, ongoing.  Continue current medication regimen and adjust as needed.  Lipid panel today to recheck triglycerides -- may benefit from fish oil daily to help lower levels. If >500 consider addition of Vascepa.  

## 2020-09-05 LAB — CBC WITH DIFFERENTIAL/PLATELET
Basophils Absolute: 0 10*3/uL (ref 0.0–0.2)
Basos: 0 %
EOS (ABSOLUTE): 0.1 10*3/uL (ref 0.0–0.4)
Eos: 2 %
Hematocrit: 42.9 % (ref 37.5–51.0)
Hemoglobin: 14.4 g/dL (ref 13.0–17.7)
Immature Grans (Abs): 0 10*3/uL (ref 0.0–0.1)
Immature Granulocytes: 1 %
Lymphocytes Absolute: 2.8 10*3/uL (ref 0.7–3.1)
Lymphs: 38 %
MCH: 31.6 pg (ref 26.6–33.0)
MCHC: 33.6 g/dL (ref 31.5–35.7)
MCV: 94 fL (ref 79–97)
Monocytes Absolute: 0.7 10*3/uL (ref 0.1–0.9)
Monocytes: 9 %
Neutrophils Absolute: 3.9 10*3/uL (ref 1.4–7.0)
Neutrophils: 50 %
Platelets: 160 10*3/uL (ref 150–450)
RBC: 4.55 x10E6/uL (ref 4.14–5.80)
RDW: 13.4 % (ref 11.6–15.4)
WBC: 7.5 10*3/uL (ref 3.4–10.8)

## 2020-09-05 LAB — LIPID PANEL W/O CHOL/HDL RATIO
Cholesterol, Total: 125 mg/dL (ref 100–199)
HDL: 27 mg/dL — ABNORMAL LOW (ref >39)
LDL Chol Calc (NIH): 63 mg/dL (ref 0–99)
Triglycerides: 214 mg/dL — ABNORMAL HIGH (ref 0–149)
VLDL Cholesterol Cal: 35 mg/dL (ref 5–40)

## 2020-09-05 LAB — TSH: TSH: 2.67 u[IU]/mL (ref 0.450–4.500)

## 2020-09-05 LAB — VITAMIN B12

## 2020-09-05 NOTE — Progress Notes (Signed)
Contacted via MyChart  Good morning Juan Le, your labs have returned and overall remain stable.  No changes needed.  They were unable to obtain B12 level, will check this next visit.  Any questions? Keep being awesome!!  Thank you for allowing me to participate in your care. Kindest regards, Tanay Massiah

## 2020-09-22 ENCOUNTER — Other Ambulatory Visit: Payer: Self-pay | Admitting: Family Medicine

## 2020-10-26 ENCOUNTER — Other Ambulatory Visit: Payer: Self-pay | Admitting: Family Medicine

## 2020-10-26 DIAGNOSIS — K219 Gastro-esophageal reflux disease without esophagitis: Secondary | ICD-10-CM

## 2020-10-26 DIAGNOSIS — E119 Type 2 diabetes mellitus without complications: Secondary | ICD-10-CM

## 2020-10-30 ENCOUNTER — Other Ambulatory Visit: Payer: Self-pay | Admitting: Nurse Practitioner

## 2020-10-30 DIAGNOSIS — K219 Gastro-esophageal reflux disease without esophagitis: Secondary | ICD-10-CM

## 2020-12-05 ENCOUNTER — Other Ambulatory Visit: Payer: Self-pay

## 2020-12-05 ENCOUNTER — Encounter: Payer: Self-pay | Admitting: Nurse Practitioner

## 2020-12-05 ENCOUNTER — Ambulatory Visit (INDEPENDENT_AMBULATORY_CARE_PROVIDER_SITE_OTHER): Payer: Medicare Other | Admitting: Nurse Practitioner

## 2020-12-05 VITALS — BP 121/79 | HR 62 | Temp 97.9°F | Wt 230.8 lb

## 2020-12-05 DIAGNOSIS — E538 Deficiency of other specified B group vitamins: Secondary | ICD-10-CM

## 2020-12-05 DIAGNOSIS — G4701 Insomnia due to medical condition: Secondary | ICD-10-CM | POA: Diagnosis not present

## 2020-12-05 DIAGNOSIS — E1142 Type 2 diabetes mellitus with diabetic polyneuropathy: Secondary | ICD-10-CM | POA: Diagnosis not present

## 2020-12-05 DIAGNOSIS — D519 Vitamin B12 deficiency anemia, unspecified: Secondary | ICD-10-CM | POA: Diagnosis not present

## 2020-12-05 DIAGNOSIS — E785 Hyperlipidemia, unspecified: Secondary | ICD-10-CM

## 2020-12-05 DIAGNOSIS — K219 Gastro-esophageal reflux disease without esophagitis: Secondary | ICD-10-CM

## 2020-12-05 DIAGNOSIS — Z6833 Body mass index (BMI) 33.0-33.9, adult: Secondary | ICD-10-CM

## 2020-12-05 DIAGNOSIS — E1159 Type 2 diabetes mellitus with other circulatory complications: Secondary | ICD-10-CM

## 2020-12-05 DIAGNOSIS — E1169 Type 2 diabetes mellitus with other specified complication: Secondary | ICD-10-CM

## 2020-12-05 DIAGNOSIS — G4733 Obstructive sleep apnea (adult) (pediatric): Secondary | ICD-10-CM

## 2020-12-05 DIAGNOSIS — I700 Atherosclerosis of aorta: Secondary | ICD-10-CM

## 2020-12-05 DIAGNOSIS — E669 Obesity, unspecified: Secondary | ICD-10-CM

## 2020-12-05 DIAGNOSIS — J449 Chronic obstructive pulmonary disease, unspecified: Secondary | ICD-10-CM | POA: Diagnosis not present

## 2020-12-05 DIAGNOSIS — I152 Hypertension secondary to endocrine disorders: Secondary | ICD-10-CM

## 2020-12-05 LAB — BAYER DCA HB A1C WAIVED: HB A1C (BAYER DCA - WAIVED): 6.9 % (ref <7.0)

## 2020-12-05 LAB — MICROALBUMIN, URINE WAIVED
Creatinine, Urine Waived: 300 mg/dL (ref 10–300)
Microalb, Ur Waived: 30 mg/L — ABNORMAL HIGH (ref 0–19)
Microalb/Creat Ratio: <30 mg/g (ref <30)

## 2020-12-05 MED ORDER — TELMISARTAN 40 MG PO TABS: 40.0000 mg | ORAL_TABLET | Freq: Every day | ORAL | 4 refills | Status: DC

## 2020-12-05 MED ORDER — METFORMIN HCL 1000 MG PO TABS
1000.0000 mg | ORAL_TABLET | Freq: Two times a day (BID) | ORAL | 4 refills | Status: DC
Start: 2020-12-05 — End: 2021-12-01

## 2020-12-05 MED ORDER — GABAPENTIN 400 MG PO CAPS
ORAL_CAPSULE | ORAL | 4 refills | Status: DC
Start: 2020-12-05 — End: 2021-11-20

## 2020-12-05 MED ORDER — CLONIDINE HCL 0.1 MG PO TABS
0.1000 mg | ORAL_TABLET | Freq: Two times a day (BID) | ORAL | 4 refills | Status: DC
Start: 2020-12-05 — End: 2021-11-20

## 2020-12-05 MED ORDER — ATORVASTATIN CALCIUM 40 MG PO TABS
ORAL_TABLET | ORAL | 4 refills | Status: DC
Start: 2020-12-05 — End: 2021-11-20

## 2020-12-05 MED ORDER — OMEPRAZOLE 40 MG PO CPDR
40.0000 mg | DELAYED_RELEASE_CAPSULE | Freq: Every day | ORAL | 4 refills | Status: DC
Start: 2020-12-05 — End: 2021-12-01

## 2020-12-05 NOTE — Progress Notes (Signed)
BP 121/79   Pulse 62   Temp 97.9 F (36.6 C) (Oral)   Wt 230 lb 12.8 oz (104.7 kg)   SpO2 97%   BMI 33.59 kg/m    Subjective:    Patient ID: Juan Le., male    DOB: 1945-10-21, 75 y.o.   MRN: [id number]  HPI: Juan Le. is a 75 y.o. male  Chief Complaint  Patient presents with  . Follow-up    Pt states would like to discuss with provider his current medications and refills.   DIABETES Last A1C in November 7%.  Continues on Metformin 1000 MG BID and Gabapentin 400 MG TID (1200 MG total) for neuropathy discomfort.   Hypoglycemic episodes:no Polydipsia/polyuria: no Visual disturbance: no Chest pain: no Paresthesias: no Glucose Monitoring: yes  Accucheck frequency: last checked one week ago  Fasting glucose: 130-150  Post prandial:  Evening:  Before meals: Taking Insulin?: no  Long acting insulin:  Short acting insulin: Blood Pressure Monitoring: rarely Retinal Examination: Up to Date Foot Exam: Up to Date Pneumovax: Up to Date Influenza: Up to Date -- received at [place] Aspirin: yes   HYPERTENSION / HYPERLIPIDEMIA Currently taking Telmisartan 40 MG daily, Clonidine 0.1 MG BID, ASA, and Atorvastatin 40 MG daily.  Last LDL 63 in November.   Satisfied with current treatment? yes Duration of hypertension: chronic BP monitoring frequency: rarely BP range:  BP medication side effects: no Duration of hyperlipidemia: chronic Cholesterol medication side effects: no Cholesterol supplements: none Medication compliance: good compliance Aspirin: yes Recent stressors: no Recurrent headaches: no Visual changes: no Palpitations: no Dyspnea: no Chest pain: no Lower extremity edema: no Dizzy/lightheaded: no   COPD Has not smoked since teen years.  Does not use any inhalers at his time, was at one time but stopped by pulmonary.  Did have a CT scan in 2019 noting aortic atherosclerosis, educated him on this.  Last saw pulmonary 12/12/2017. COPD  status: stable Satisfied with current treatment?: yes Oxygen use: no Dyspnea frequency: none Cough frequency: none Rescue inhaler frequency:  none Limitation of activity: no Productive cough:  none Last Spirometry: unknown Pneumovax: Up to Date Influenza: Up to Date  INSOMNIA Currently on Seroquel 200 MG at bedtime.  Tried Ambien in past, but was taken off this.  Discuss with him due to age >32 this is not recommended.  Tried Trazodone in past with no benefit.  Has not tried Melatonin.  Reports having "bad sleep apnea" -- does not tolerate CPAP -- has not used in several months.  Pt is aware of risks of psychoactive medication use to include increased sedation, respiratory suppression, falls, extrapyramidal movements,  dependence and cardiovascular events.  Pt would like to continue treatment as benefit determined to outweigh risk.   Duration: chronic Satisfied with sleep quality: no Difficulty falling asleep: no Difficulty staying asleep: yes Waking a few hours after sleep onset: yes Early morning awakenings: no Daytime hypersomnolence: no Wakes feeling refreshed: no Good sleep hygiene: yes Apnea: yes Snoring: yes Depressed/anxious mood: no Recent stress: no Restless legs/nocturnal leg cramps: no Chronic pain/arthritis: no History of sleep study: yes -- 2 sleep studies Treatments attempted: [place]   Relevant past medical, surgical, family and social history reviewed and updated as indicated. Interim medical history since our last visit reviewed. Allergies and medications reviewed and updated.  Review of Systems  Constitutional: Negative for activity change, diaphoresis, fatigue and fever.  Respiratory: Negative for cough, chest tightness, shortness of breath and wheezing.  Cardiovascular: Negative for chest pain, palpitations and leg swelling.  Gastrointestinal: Negative.   Endocrine: Negative for polydipsia, polyphagia and polyuria.  Neurological: Negative.    Psychiatric/Behavioral: Positive for sleep disturbance. Negative for decreased concentration, self-injury and suicidal ideas. The patient is not nervous/anxious.     Per HPI unless specifically indicated above     Objective:    BP 121/79   Pulse 62   Temp 97.9 F (36.6 C) (Oral)   Wt 230 lb 12.8 oz (104.7 kg)   SpO2 97%   BMI 33.59 kg/m   Wt Readings from Last 3 Encounters:  12/05/20 230 lb 12.8 oz (104.7 kg)  09/04/20 231 lb (104.8 kg)  06/11/20 233 lb (105.7 kg)    Physical Exam Vitals and nursing note reviewed.  Constitutional:      General: He is awake. He is not in acute distress.    Appearance: He is well-developed and well-groomed. He is obese. He is not ill-appearing.  HENT:     Head: Normocephalic and atraumatic.     Right Ear: Hearing normal. No drainage.     Left Ear: Hearing normal. No drainage.  Eyes:     General: Lids are normal.        Right eye: No discharge.        Left eye: No discharge.     Conjunctiva/sclera: Conjunctivae normal.     Pupils: Pupils are equal, round, and reactive to light.  Neck:     Thyroid: No thyromegaly.     Vascular: No carotid bruit.     Trachea: Trachea normal.  Cardiovascular:     Rate and Rhythm: Normal rate and regular rhythm.     Heart sounds: Normal heart sounds, S1 normal and S2 normal. No murmur heard. No gallop.   Pulmonary:     Effort: Pulmonary effort is normal. No accessory muscle usage or respiratory distress.     Breath sounds: Normal breath sounds.  Abdominal:     General: Bowel sounds are normal.     Palpations: Abdomen is soft.  Musculoskeletal:        General: Normal range of motion.     Cervical back: Normal range of motion and neck supple.     Right lower leg: No edema.     Left lower leg: No edema.  Skin:    General: Skin is warm and dry.     Capillary Refill: Capillary refill takes less than 2 seconds.  Neurological:     Mental Status: He is alert and oriented to person, place, and time.      Deep Tendon Reflexes: Reflexes are normal and symmetric.  Psychiatric:        Attention and Perception: Attention normal.        Mood and Affect: Mood normal.        Speech: Speech normal.        Behavior: Behavior normal. Behavior is cooperative.        Thought Content: Thought content normal.    Results for orders placed or performed in visit on 09/04/20  Bayer DCA Hb A1c Waived  Result Value Ref Range   HB A1C (BAYER DCA - WAIVED) 7.0 (H) <7.0 %  TSH  Result Value Ref Range   TSH 2.670 0.450 - 4.500 uIU/mL  Lipid Panel w/o Chol/HDL Ratio  Result Value Ref Range   Cholesterol, Total 125 100 - 199 mg/dL   Triglycerides 214 (H) 0 - 149 mg/dL   HDL 27 (L) >39 mg/dL  VLDL Cholesterol Cal 35 5 - 40 mg/dL   LDL Chol Calc (NIH) 63 0 - 99 mg/dL  CBC with Differential/Platelet  Result Value Ref Range   WBC 7.5 3.4 - 10.8 x10E3/uL   RBC 4.55 4.14 - 5.80 x10E6/uL   Hemoglobin 14.4 13.0 - 17.7 g/dL   Hematocrit 42.9 37.5 - 51.0 %   MCV 94 79 - 97 fL   MCH 31.6 26.6 - 33.0 pg   MCHC 33.6 31.5 - 35.7 g/dL   RDW 13.4 11.6 - 15.4 %   Platelets 160 150 - 450 x10E3/uL   Neutrophils 50 Not Estab. %   Lymphs 38 Not Estab. %   Monocytes 9 Not Estab. %   Eos 2 Not Estab. %   Basos 0 Not Estab. %   Neutrophils Absolute 3.9 1.4 - 7.0 x10E3/uL   Lymphocytes Absolute 2.8 0.7 - 3.1 x10E3/uL   Monocytes Absolute 0.7 0.1 - 0.9 x10E3/uL   EOS (ABSOLUTE) 0.1 0.0 - 0.4 x10E3/uL   Basophils Absolute 0.0 0.0 - 0.2 x10E3/uL   Immature Granulocytes 1 Not Estab. %   Immature Grans (Abs) 0.0 0.0 - 0.1 x10E3/uL  Vitamin B12  Result Value Ref Range   Vitamin B-12 CANCELED pg/mL      Assessment & Plan:   Problem List Items Addressed This Visit      Cardiovascular and Mediastinum   Hypertension associated with diabetes ([place])    Chronic, ongoing with BP at goal today.  Continue current medication with Telmisartan as kidney protection in diabetes.  Recommend he monitor BP at least 3 days a week at  home and document for provider + focus on DASH diet.  BMP today.  Return in 3 months to meet new PCP.      Relevant Medications   atorvastatin (LIPITOR) 40 MG tablet   cloNIDine (CATAPRES) 0.1 MG tablet   metFORMIN (GLUCOPHAGE) 1000 MG tablet   telmisartan (MICARDIS) 40 MG tablet   Other Relevant Orders   Bayer DCA Hb A1c Waived   Basic metabolic panel   Microalbumin, Urine Waived   Aortic atherosclerosis ([place])    Noted on CT 03/30/2018.  Recommend continue ASA and Atorvastatin daily + continue complete cessation of smoking for prevention.  Educated him on findings.      Relevant Medications   atorvastatin (LIPITOR) 40 MG tablet   cloNIDine (CATAPRES) 0.1 MG tablet   telmisartan (MICARDIS) 40 MG tablet     Respiratory   Obstructive sleep apnea    Chronic, ongoing.  Recommend he trial a new CPAP or refer to pulmonary, does not wish to pursue at this time.  Discussed at length OSA with him and risks for long term health.      COPD with chronic bronchitis and emphysema (HCC)    Chronic, stable without inhalers.  History of smoking.  Plan on spirometry next visit for baseline.  Recommend he utilize CPAP at home.  Return to pulmonary as needed based on symptoms.        Digestive   GERD (gastroesophageal reflux disease)    Chronic, ongoing.  Continue current regimen and trial reduction to discontinuation in future, if poor tolerance continue regimen.  Plan on Mag level annually, next visit.      Relevant Medications   omeprazole (PRILOSEC) 40 MG capsule     Endocrine   Type 2 diabetes mellitus with peripheral neuropathy (HCC) - Primary    Chronic, ongoing with A1C today 6.9%, slight trend downwards.  Urine ALB 30  and A:C <30 today.  Continue current regimen and adjust as needed, recommend heavy focus on diet and regular activity to avoid elevation >7%.  Continue Telmisartan for kidney protection.  Recommend to continue to check BS at home daily and document, bring to visits.   Continue Gabapentin and monitoring feet at home.  Foot exam up to date.  Check B12 today.  Return in 3 months.      Relevant Medications   atorvastatin (LIPITOR) 40 MG tablet   gabapentin (NEURONTIN) 400 MG capsule   metFORMIN (GLUCOPHAGE) 1000 MG tablet   telmisartan (MICARDIS) 40 MG tablet   Other Relevant Orders   Bayer DCA Hb A1c Waived   Basic metabolic panel   Microalbumin, Urine Waived   Hyperlipidemia associated with type 2 diabetes mellitus (HCC)    Chronic, ongoing.  Continue current medication regimen and adjust as needed.  Lipid panel today to recheck triglycerides -- may benefit from fish oil daily to help lower levels. If >500 consider addition of Vascepa.       Relevant Medications   atorvastatin (LIPITOR) 40 MG tablet   metFORMIN (GLUCOPHAGE) 1000 MG tablet   telmisartan (MICARDIS) 40 MG tablet   Other Relevant Orders   Bayer DCA Hb A1c Waived   Lipid Panel w/o Chol/HDL Ratio     Other   Insomnia    Chronic, ongoing with underlying OSA.  Recommend he trial a new CPAP or refer to pulmonary, does not wish to pursue at this time.  Discussed at length OSA with him and risks.  Will continue Seroquel 200 MG at this time, educated on risks/benefits medication + black box warnings -- he wishes to continue this medication and is aware of all risks.  Recommend he trial Melatonin.  Return in 3 months.      Obesity    BMI 33.59.  Recommended eating smaller high protein, low fat meals more frequently and exercising 30 mins a day 5 times a week with a goal of 10-15lb weight loss in the next 3 months. Patient voiced their understanding and motivation to adhere to these recommendations.       Relevant Medications   metFORMIN (GLUCOPHAGE) 1000 MG tablet    Other Visit Diagnoses    B12 deficiency       History of low levels reported, will check this today due to long term Metformin use and neuropathy -- if low start supplement.   Relevant Orders   Vitamin B12       Follow  up plan: Return in about 3 months (around 03/04/2021) for T2DM, HTN/HLD, INSOMNIA, COPD -- spirometry needed.

## 2020-12-05 NOTE — Assessment & Plan Note (Signed)
Chronic, ongoing.  Continue current medication regimen and adjust as needed.  Lipid panel today to recheck triglycerides -- may benefit from fish oil daily to help lower levels. If >500 consider addition of Vascepa.

## 2020-12-05 NOTE — Assessment & Plan Note (Signed)
Noted on CT 03/30/2018.  Recommend continue ASA and Atorvastatin daily + continue complete cessation of smoking for prevention.  Educated him on findings.

## 2020-12-05 NOTE — Assessment & Plan Note (Signed)
Chronic, ongoing with underlying OSA.  Recommend he trial a new CPAP or refer to pulmonary, does not wish to pursue at this time.  Discussed at length OSA with him and risks.  Will continue Seroquel 200 MG at this time, educated on risks/benefits medication + black box warnings -- he wishes to continue this medication and is aware of all risks.  Recommend he trial Melatonin.  Return in 3 months.

## 2020-12-05 NOTE — Assessment & Plan Note (Signed)
Chronic, stable without inhalers.  History of smoking.  Plan on spirometry next visit for baseline.  Recommend he utilize CPAP at home.  Return to pulmonary as needed based on symptoms.

## 2020-12-05 NOTE — Assessment & Plan Note (Signed)
Chronic, ongoing.  Recommend he trial a new CPAP or refer to pulmonary, does not wish to pursue at this time.  Discussed at length OSA with him and risks for long term health.

## 2020-12-05 NOTE — Assessment & Plan Note (Signed)
Chronic, ongoing with A1C today 6.9%, slight trend downwards.  Urine ALB 30 and A:C <30 today.  Continue current regimen and adjust as needed, recommend heavy focus on diet and regular activity to avoid elevation >7%.  Continue Telmisartan for kidney protection.  Recommend to continue to check BS at home daily and document, bring to visits.  Continue Gabapentin and monitoring feet at home.  Foot exam up to date.  Check B12 today.  Return in 3 months.

## 2020-12-05 NOTE — Assessment & Plan Note (Signed)
Chronic, ongoing.  Continue current regimen and trial reduction to discontinuation in future, if poor tolerance continue regimen.  Plan on Mag level annually, next visit.

## 2020-12-05 NOTE — Patient Instructions (Signed)
Diabetes Mellitus and Nutrition, Adult When you have diabetes, or diabetes mellitus, it is very important to have healthy eating habits because your blood sugar (glucose) levels are greatly affected by what you eat and drink. Eating healthy foods in the right amounts, at about the same times every day, can help you:  Control your blood glucose.  Lower your risk of heart disease.  Improve your blood pressure.  Reach or maintain a healthy weight. What can affect my meal plan? Every person with diabetes is different, and each person has different needs for a meal plan. Your health care provider may recommend that you work with a dietitian to make a meal plan that is best for you. Your meal plan may vary depending on factors such as:  The calories you need.  The medicines you take.  Your weight.  Your blood glucose, blood pressure, and cholesterol levels.  Your activity level.  Other health conditions you have, such as heart or kidney disease. How do carbohydrates affect me? Carbohydrates, also called carbs, affect your blood glucose level more than any other type of food. Eating carbs naturally raises the amount of glucose in your blood. Carb counting is a method for keeping track of how many carbs you eat. Counting carbs is important to keep your blood glucose at a healthy level, especially if you use insulin or take certain oral diabetes medicines. It is important to know how many carbs you can safely have in each meal. This is different for every person. Your dietitian can help you calculate how many carbs you should have at each meal and for each snack. How does alcohol affect me? Alcohol can cause a sudden decrease in blood glucose (hypoglycemia), especially if you use insulin or take certain oral diabetes medicines. Hypoglycemia can be a life-threatening condition. Symptoms of hypoglycemia, such as sleepiness, dizziness, and confusion, are similar to symptoms of having too much  alcohol.  Do not drink alcohol if: ? Your health care provider tells you not to drink. ? You are pregnant, may be pregnant, or are planning to become pregnant.  If you drink alcohol: ? Do not drink on an empty stomach. ? Limit how much you use to:  0-1 drink a day for women.  0-2 drinks a day for men. ? Be aware of how much alcohol is in your drink. In the [place] one drink equals one 12 oz bottle of beer (355 mL), one 5 oz glass of wine (148 mL), or one 1 oz glass of hard liquor (44 mL). ? Keep yourself hydrated with water, diet soda, or unsweetened iced tea.  Keep in mind that regular soda, juice, and other mixers may contain a lot of sugar and must be counted as carbs. What are tips for following this plan? Reading food labels  Start by checking the serving size on the "Nutrition Facts" label of packaged foods and drinks. The amount of calories, carbs, fats, and other nutrients listed on the label is based on one serving of the item. Many items contain more than one serving per package.  Check the total grams (g) of carbs in one serving. You can calculate the number of servings of carbs in one serving by dividing the total carbs by 15. For example, if a food has 30 g of total carbs per serving, it would be equal to 2 servings of carbs.  Check the number of grams (g) of saturated fats and trans fats in one serving. Choose foods that have   a low amount or none of these fats.  Check the number of milligrams (mg) of salt (sodium) in one serving. Most people should limit total sodium intake to less than 2,300 mg per day.  Always check the nutrition information of foods labeled as "low-fat" or "nonfat." These foods may be higher in added sugar or refined carbs and should be avoided.  Talk to your dietitian to identify your daily goals for nutrients listed on the label. Shopping  Avoid buying canned, pre-made, or processed foods. These foods tend to be high in fat, sodium, and added  sugar.  Shop around the outside edge of the grocery store. This is where you will most often find fresh fruits and vegetables, bulk grains, fresh meats, and fresh dairy. Cooking  Use low-heat cooking methods, such as baking, instead of high-heat cooking methods like deep frying.  Cook using healthy oils, such as olive, canola, or sunflower oil.  Avoid cooking with butter, cream, or high-fat meats. Meal planning  Eat meals and snacks regularly, preferably at the same times every day. Avoid going long periods of time without eating.  Eat foods that are high in fiber, such as fresh fruits, vegetables, beans, and whole grains. Talk with your dietitian about how many servings of carbs you can eat at each meal.  Eat 4-6 oz (112-168 g) of lean protein each day, such as lean meat, chicken, fish, eggs, or tofu. One ounce (oz) of lean protein is equal to: ? 1 oz (28 g) of meat, chicken, or fish. ? 1 egg. ?  cup (62 g) of tofu.  Eat some foods each day that contain healthy fats, such as avocado, nuts, seeds, and fish.   What foods should I eat? Fruits Berries. Apples. Oranges. Peaches. Apricots. Plums. Grapes. Mango. Papaya. Pomegranate. Kiwi. Cherries. Vegetables Lettuce. Spinach. Leafy greens, including kale, chard, collard greens, and mustard greens. Beets. Cauliflower. Cabbage. Broccoli. Carrots. Green beans. Tomatoes. Peppers. Onions. Cucumbers. Brussels sprouts. Grains Whole grains, such as whole-wheat or whole-grain bread, crackers, tortillas, cereal, and pasta. Unsweetened oatmeal. Quinoa. Brown or wild rice. Meats and other proteins Seafood. Poultry without skin. Lean cuts of poultry and beef. Tofu. Nuts. Seeds. Dairy Low-fat or fat-free dairy products such as milk, yogurt, and cheese. The items listed above may not be a complete list of foods and beverages you can eat. Contact a dietitian for more information. What foods should I avoid? Fruits Fruits canned with  syrup. Vegetables Canned vegetables. Frozen vegetables with butter or cream sauce. Grains Refined white flour and flour products such as bread, pasta, snack foods, and cereals. Avoid all processed foods. Meats and other proteins Fatty cuts of meat. Poultry with skin. Breaded or fried meats. Processed meat. Avoid saturated fats. Dairy Full-fat yogurt, cheese, or milk. Beverages Sweetened drinks, such as soda or iced tea. The items listed above may not be a complete list of foods and beverages you should avoid. Contact a dietitian for more information. Questions to ask a health care provider  Do I need to meet with a diabetes educator?  Do I need to meet with a dietitian?  What number can I call if I have questions?  When are the best times to check my blood glucose? Where to find more information:  American Diabetes Association: diabetes.org  Academy of Nutrition and Dietetics: www.eatright.org  National Institute of Diabetes and Digestive and Kidney Diseases: www.niddk.nih.gov  Association of Diabetes Care and Education Specialists: www.diabeteseducator.org Summary  It is important to have healthy eating   habits because your blood sugar (glucose) levels are greatly affected by what you eat and drink.  A healthy meal plan will help you control your blood glucose and maintain a healthy lifestyle.  Your health care provider may recommend that you work with a dietitian to make a meal plan that is best for you.  Keep in mind that carbohydrates (carbs) and alcohol have immediate effects on your blood glucose levels. It is important to count carbs and to use alcohol carefully. This information is not intended to replace advice given to you by your health care provider. Make sure you discuss any questions you have with your health care provider. Document Revised: 09/11/2019 Document Reviewed: 09/11/2019 Elsevier Patient Education  2021 Elsevier Inc.  

## 2020-12-05 NOTE — Assessment & Plan Note (Signed)
BMI 33.59. Recommended eating smaller high protein, low fat meals more frequently and exercising 30 mins a day 5 times a week with a goal of 10-15lb weight loss in the next 3 months. Patient voiced their understanding and motivation to adhere to these recommendations.  

## 2020-12-05 NOTE — Assessment & Plan Note (Signed)
Chronic, ongoing with BP at goal today.  Continue current medication with Telmisartan as kidney protection in diabetes.  Recommend he monitor BP at least 3 days a week at home and document for provider + focus on DASH diet.  BMP today.  Return in 3 months to meet new PCP.

## 2020-12-06 ENCOUNTER — Encounter: Payer: Self-pay | Admitting: Nurse Practitioner

## 2020-12-06 DIAGNOSIS — E538 Deficiency of other specified B group vitamins: Secondary | ICD-10-CM | POA: Insufficient documentation

## 2020-12-06 LAB — BASIC METABOLIC PANEL
BUN/Creatinine Ratio: 14 (ref 10–24)
BUN: 13 mg/dL (ref 8–27)
CO2: 20 mmol/L (ref 20–29)
Calcium: 8.8 mg/dL (ref 8.6–10.2)
Chloride: 101 mmol/L (ref 96–106)
Creatinine, Ser: 0.95 mg/dL (ref 0.76–1.27)
GFR calc Af Amer: 91 mL/min/{1.73_m2} (ref >59)
GFR calc non Af Amer: 79 mL/min/{1.73_m2} (ref >59)
Glucose: 184 mg/dL — ABNORMAL HIGH (ref 65–99)
Potassium: 4.2 mmol/L (ref 3.5–5.2)
Sodium: 137 mmol/L (ref 134–144)

## 2020-12-06 LAB — LIPID PANEL W/O CHOL/HDL RATIO
Cholesterol, Total: 121 mg/dL (ref 100–199)
HDL: 28 mg/dL — ABNORMAL LOW (ref >39)
LDL Chol Calc (NIH): 64 mg/dL (ref 0–99)
Triglycerides: 172 mg/dL — ABNORMAL HIGH (ref 0–149)
VLDL Cholesterol Cal: 29 mg/dL (ref 5–40)

## 2020-12-06 LAB — VITAMIN B12: Vitamin B-12: 265 pg/mL (ref 232–1245)

## 2020-12-06 NOTE — Progress Notes (Signed)
Contacted via [place] morning Blaise, your labs have returned, overall are remaining stable.  Kidney function is normal.   - B12 level is on low side, would like to see >300.  Please start taking Vitamin B12 1000 MCG daily, this is good for overall nervous system health and nerve pain.  With you taking long term Metformin, B12 levels can be lower due to this.  So taking supplement can help.  You can obtain this over the counter in vitamin section. - Cholesterol levels show goal LDL, continue your statin daily.  Triglycerides are mildly elevated, we will continue to monitor this -- cut back on processed and high sugar foods and add in more fish into diet.  Any questions? Keep being awesome!!  Thank you for allowing me to participate in your care. Kindest regards, Kayden Amend

## 2020-12-22 ENCOUNTER — Encounter: Payer: Self-pay | Admitting: Family Medicine

## 2021-02-04 ENCOUNTER — Encounter: Payer: Self-pay | Admitting: Nurse Practitioner

## 2021-02-04 DIAGNOSIS — H01009 Unspecified blepharitis unspecified eye, unspecified eyelid: Secondary | ICD-10-CM | POA: Diagnosis not present

## 2021-02-04 DIAGNOSIS — E119 Type 2 diabetes mellitus without complications: Secondary | ICD-10-CM | POA: Diagnosis not present

## 2021-02-04 DIAGNOSIS — D519 Vitamin B12 deficiency anemia, unspecified: Secondary | ICD-10-CM | POA: Insufficient documentation

## 2021-02-04 DIAGNOSIS — H2513 Age-related nuclear cataract, bilateral: Secondary | ICD-10-CM | POA: Diagnosis not present

## 2021-02-04 LAB — HM DIABETES EYE EXAM

## 2021-02-12 ENCOUNTER — Telehealth: Payer: Self-pay | Admitting: Nurse Practitioner

## 2021-02-12 NOTE — Telephone Encounter (Signed)
Copied from [place] [id number]. Topic: Medicare AWV >> Feb 12, 2021  1:51 PM Cher Nakai R wrote: Reason for CRM:  Left message for patient to call back and schedule the Medicare Annual Wellness Visit (AWV) virtually or by telephone.  Last AWV [phone]  Please schedule at anytime with [facility] Health Advisor.  45 minute appointment  Any questions, please call me at [phone]

## 2021-03-04 ENCOUNTER — Encounter: Payer: Self-pay | Admitting: Nurse Practitioner

## 2021-03-04 ENCOUNTER — Other Ambulatory Visit: Payer: Self-pay

## 2021-03-04 ENCOUNTER — Ambulatory Visit (INDEPENDENT_AMBULATORY_CARE_PROVIDER_SITE_OTHER): Payer: Medicare Other | Admitting: Nurse Practitioner

## 2021-03-04 VITALS — BP 142/78 | HR 82 | Temp 97.1°F | Wt 231.4 lb

## 2021-03-04 DIAGNOSIS — E785 Hyperlipidemia, unspecified: Secondary | ICD-10-CM | POA: Diagnosis not present

## 2021-03-04 DIAGNOSIS — E538 Deficiency of other specified B group vitamins: Secondary | ICD-10-CM

## 2021-03-04 DIAGNOSIS — I700 Atherosclerosis of aorta: Secondary | ICD-10-CM

## 2021-03-04 DIAGNOSIS — G4733 Obstructive sleep apnea (adult) (pediatric): Secondary | ICD-10-CM

## 2021-03-04 DIAGNOSIS — D519 Vitamin B12 deficiency anemia, unspecified: Secondary | ICD-10-CM | POA: Diagnosis not present

## 2021-03-04 DIAGNOSIS — E1159 Type 2 diabetes mellitus with other circulatory complications: Secondary | ICD-10-CM | POA: Diagnosis not present

## 2021-03-04 DIAGNOSIS — E1169 Type 2 diabetes mellitus with other specified complication: Secondary | ICD-10-CM

## 2021-03-04 DIAGNOSIS — J449 Chronic obstructive pulmonary disease, unspecified: Secondary | ICD-10-CM

## 2021-03-04 DIAGNOSIS — K219 Gastro-esophageal reflux disease without esophagitis: Secondary | ICD-10-CM

## 2021-03-04 DIAGNOSIS — E1142 Type 2 diabetes mellitus with diabetic polyneuropathy: Secondary | ICD-10-CM

## 2021-03-04 DIAGNOSIS — I152 Hypertension secondary to endocrine disorders: Secondary | ICD-10-CM

## 2021-03-04 LAB — URINALYSIS, ROUTINE W REFLEX MICROSCOPIC
Bilirubin, UA: NEGATIVE
Leukocytes,UA: NEGATIVE
Nitrite, UA: NEGATIVE
Protein,UA: NEGATIVE
RBC, UA: NEGATIVE
Specific Gravity, UA: >1.03 — ABNORMAL HIGH (ref 1.005–1.030)
Urobilinogen, Ur: 0.2 mg/dL (ref 0.2–1.0)
pH, UA: 5.5 (ref 5.0–7.5)

## 2021-03-04 MED ORDER — ALBUTEROL SULFATE (2.5 MG/3ML) 0.083% IN NEBU
2.5000 mg | INHALATION_SOLUTION | Freq: Once | RESPIRATORY_TRACT | Status: AC
  Administered 2021-03-04: 2.5 mg via RESPIRATORY_TRACT

## 2021-03-04 NOTE — Progress Notes (Signed)
Results reviewed with patient during visit today.  Declines inhalers at this time.

## 2021-03-04 NOTE — Assessment & Plan Note (Signed)
Chronic, ongoing.  Continue current medication regimen and adjust as needed.  Lipid panel today due to elevated triglycerides.  May benefit from fish oil daily to help lower levels. If >500 consider addition of Vascepa.  Will make recommendations based on lab results.

## 2021-03-04 NOTE — Progress Notes (Signed)
c  BP (!) 142/78   Pulse 82   Temp (!) 97.1 F (36.2 C)   Wt 231 lb 6 oz (105 kg)   SpO2 96%   BMI 33.68 kg/m    Subjective:    Patient ID: Juan Le., male    DOB: [birth year], 75 y.o.   MRN: [id number]  HPI: Juan Le. is a 75 y.o. male  Chief Complaint  Patient presents with  . COPD   HYPERTENSION / HYPERLIPIDEMIA Satisfied with current treatment? yes Duration of hypertension: years BP monitoring frequency: not checking BP range:  BP medication side effects: no Past BP meds: telmisartan and clonidine Duration of hyperlipidemia: years Cholesterol medication side effects: no Cholesterol supplements: none Past cholesterol medications: atorvastain (lipitor) Medication compliance: excellent compliance Aspirin: yes Recent stressors: no Recurrent headaches: no Visual changes: no Palpitations: no Dyspnea: with something physical Chest pain: no Lower extremity edema: no Dizzy/lightheaded: no  DIABETES Hypoglycemic episodes:no Polydipsia/polyuria: no Visual disturbance: no Chest pain: no Paresthesias: yes Glucose Monitoring: no  Accucheck frequency: Not Checking  Fasting glucose:  Post prandial:  Evening:  Before meals: Taking Insulin?: no  Long acting insulin:  Short acting insulin: Blood Pressure Monitoring: not checking Retinal Examination: Up to Date Foot Exam: Up to Date Diabetic Education: Not Completed Pneumovax: Up to Date Influenza: Up to Date Aspirin: yes  COPD COPD status: controlled Satisfied with current treatment?: yes Oxygen use: no Dyspnea frequency: when he is doing something physical Cough frequency:  Rescue inhaler frequency:  none Limitation of activity: yes Productive cough:  Last Spirometry: 03/04/2021 Pneumovax: Up to Date Influenza: Up to Date  SLEEP APNEA Sleep apnea status: uncontrolled Duration: chronic Satisfied with current treatment?:  no CPAP use:  no Sleep quality with CPAP use: good",  average Treament compliance:poor compliance Last sleep study:  Treatments attempted: CPAP Wakes feeling refreshed:  yes Daytime hypersomnolence:  no Fatigue:  no Insomnia:  no Good sleep hygiene:  no Difficulty falling asleep:  no Difficulty staying asleep:  no Snoring bothers bed partner:  unknown Observed apnea by bed partner: no Obesity:  yes Hypertension: yes  Pulmonary hypertension:  no Coronary artery disease:  yes  NEUROPATHY Patient started the Vitamin B12 OTC since last visit.  Continues on the Gabapentin.  States he still has neuropathy but it is improved from prior.   Relevant past medical, surgical, family and social history reviewed and updated as indicated. Interim medical history since our last visit reviewed. Allergies and medications reviewed and updated.  Review of Systems  Eyes: Negative for visual disturbance.  Respiratory: Negative for chest tightness and shortness of breath.   Cardiovascular: Negative for chest pain, palpitations and leg swelling.  Endocrine: Negative for polydipsia and polyuria.  Neurological: Negative for dizziness, light-headedness, numbness and headaches.    Per HPI unless specifically indicated above     Objective:    BP (!) 142/78   Pulse 82   Temp (!) 97.1 F (36.2 C)   Wt 231 lb 6 oz (105 kg)   SpO2 96%   BMI 33.68 kg/m   Wt Readings from Last 3 Encounters:  03/04/21 231 lb 6 oz (105 kg)  12/05/20 230 lb 12.8 oz (104.7 kg)  09/04/20 231 lb (104.8 kg)    Physical Exam Vitals and nursing note reviewed.  Constitutional:      General: He is not in acute distress.    Appearance: Normal appearance. He is obese. He is not ill-appearing, toxic-appearing or diaphoretic.  HENT:     Head: Normocephalic.     Right Ear: External ear normal.     Left Ear: External ear normal.     Nose: Nose normal. No congestion or rhinorrhea.     Mouth/Throat:     Mouth: Mucous membranes are moist.  Eyes:     General:        Right eye:  No discharge.        Left eye: No discharge.     Extraocular Movements: Extraocular movements intact.     Conjunctiva/sclera: Conjunctivae normal.     Pupils: Pupils are equal, round, and reactive to light.  Cardiovascular:     Rate and Rhythm: Normal rate and regular rhythm.     Heart sounds: No murmur heard.   Pulmonary:     Effort: Pulmonary effort is normal. No respiratory distress.     Breath sounds: Normal breath sounds. No wheezing, rhonchi or rales.  Abdominal:     General: Abdomen is flat. Bowel sounds are normal.  Musculoskeletal:     Cervical back: Normal range of motion and neck supple.  Skin:    General: Skin is warm and dry.     Capillary Refill: Capillary refill takes less than 2 seconds.  Neurological:     General: No focal deficit present.     Mental Status: He is alert and oriented to person, place, and time.  Psychiatric:        Mood and Affect: Mood normal.        Behavior: Behavior normal.        Thought Content: Thought content normal.        Judgment: Judgment normal.     Results for orders placed or performed in visit on 02/04/21  HM DIABETES EYE EXAM  Result Value Ref Range   HM Diabetic Eye Exam No Retinopathy No Retinopathy      Assessment & Plan:   Problem List Items Addressed This Visit      Cardiovascular and Mediastinum   Hypertension associated with diabetes ([place])    Chronic, ongoing with BP at goal today.  Continue current medication with Telmisartan as kidney protection in diabetes.  Recommend he monitor BP at least 3 days a week at home and document for provider + focus on DASH diet.  BMP today.  Return in 3 months.      Relevant Orders   CBC w/Diff   HgB A1c   Lipid panel   Basic metabolic panel   Vitamin I69   Urinalysis, Routine w reflex microscopic   Aortic atherosclerosis ([place]) - Primary    Noted on CT 03/30/2018.  Recommend continue ASA and Atorvastatin daily + continue complete cessation of smoking for prevention.   Educated him on findings.        Respiratory   Obstructive sleep apnea    Chronic, ongoing.  Recommend he trial a new CPAP or refer to pulmonary, does not wish to pursue at this time.  Patient aware of risks for long term health.      COPD with chronic bronchitis and emphysema (HCC)    Chronic, stable without inhalers.  History of smoking. Spirometry completed at visit today and shows COPD.  Patient declines inhalers at this time. Recommend he utilize CPAP at home.  Return to pulmonary as needed based on symptoms.      Relevant Orders   CBC w/Diff   HgB A1c   Lipid panel   Basic metabolic panel   Vitamin G29  Urinalysis, Routine w reflex microscopic   Spirometry with Graph   Spirometry with Graph (Completed)   PR DEMO &/OR EVAL,PT USE,AEROSOL DEVICE     Digestive   GERD (gastroesophageal reflux disease)    Chronic, ongoing.  Continue current regimen.  Mag level checked today.  Can consider discontinuation in the future if patient is able to tolerate, if not, will continue regimen.      Relevant Orders   Magnesium     Endocrine   Type 2 diabetes mellitus with peripheral neuropathy (HCC)    Chronic, ongoing with A1C today 6.9%, slight trend downwards at last visit.  Labs ordered today.    Urine ALB 30 and A:C <30 today.  Continue current regimen and adjust as needed, recommend heavy focus on diet and regular activity to avoid elevation >7%.  Continue Telmisartan for kidney protection.   Continue Gabapentin and monitoring feet at home.  Foot exam up to date. Continue B12 supplement.  Has shown benefit for patient's neuropathy.  Return in 3 months.      Relevant Orders   CBC w/Diff   HgB A1c   Lipid panel   Basic metabolic panel   Vitamin W40   Urinalysis, Routine w reflex microscopic   Hyperlipidemia associated with type 2 diabetes mellitus (HCC)    Chronic, ongoing.  Continue current medication regimen and adjust as needed.  Lipid panel today due to elevated triglycerides.   May benefit from fish oil daily to help lower levels. If >500 consider addition of Vascepa.  Will make recommendations based on lab results.        Relevant Orders   CBC w/Diff   HgB A1c   Lipid panel   Basic metabolic panel   Vitamin X73   Urinalysis, Routine w reflex microscopic     Other   B12 deficiency    Ongoing.  Patient taking OTC B12 supplement.  B12 level checked today.        Relevant Orders   Vitamin B12       Follow up plan: Return in about 3 months (around 06/04/2021) for HTN, HLD, DM2 FU.

## 2021-03-04 NOTE — Assessment & Plan Note (Signed)
Chronic, stable without inhalers.  History of smoking. Spirometry completed at visit today and shows COPD.  Patient declines inhalers at this time. Recommend he utilize CPAP at home.  Return to pulmonary as needed based on symptoms.

## 2021-03-04 NOTE — Assessment & Plan Note (Signed)
Ongoing.  Patient taking OTC B12 supplement.  B12 level checked today.

## 2021-03-04 NOTE — Progress Notes (Signed)
Hi Juan Le.  It was a pleasure meeting you today.  Your urine showed some glucose and ketones.  This is likely because of your diabetes.  We will continue to monitor this at future appointments.

## 2021-03-04 NOTE — Assessment & Plan Note (Signed)
Chronic, ongoing with BP at goal today.  Continue current medication with Telmisartan as kidney protection in diabetes.  Recommend he monitor BP at least 3 days a week at home and document for provider + focus on DASH diet.  BMP today.  Return in 3 months. 

## 2021-03-04 NOTE — Assessment & Plan Note (Signed)
Noted on CT 03/30/2018.  Recommend continue ASA and Atorvastatin daily + continue complete cessation of smoking for prevention.  Educated him on findings. 

## 2021-03-04 NOTE — Assessment & Plan Note (Signed)
Chronic, ongoing.  Continue current regimen.  Mag level checked today.  Can consider discontinuation in the future if patient is able to tolerate, if not, will continue regimen.

## 2021-03-04 NOTE — Assessment & Plan Note (Signed)
Chronic, ongoing with A1C today 6.9%, slight trend downwards at last visit.  Labs ordered today.    Urine ALB 30 and A:C <30 today.  Continue current regimen and adjust as needed, recommend heavy focus on diet and regular activity to avoid elevation >7%.  Continue Telmisartan for kidney protection.   Continue Gabapentin and monitoring feet at home.  Foot exam up to date. Continue B12 supplement.  Has shown benefit for patient's neuropathy.  Return in 3 months.

## 2021-03-04 NOTE — Assessment & Plan Note (Addendum)
Chronic, ongoing.  Recommend he trial a new CPAP or refer to pulmonary, does not wish to pursue at this time.  Patient aware of risks for long term health.

## 2021-03-05 ENCOUNTER — Telehealth: Payer: Self-pay | Admitting: Nurse Practitioner

## 2021-03-05 DIAGNOSIS — E1142 Type 2 diabetes mellitus with diabetic polyneuropathy: Secondary | ICD-10-CM

## 2021-03-05 LAB — CBC WITH DIFFERENTIAL/PLATELET
Basophils Absolute: 0 10*3/uL (ref 0.0–0.2)
Basos: 0 %
EOS (ABSOLUTE): 0.1 10*3/uL (ref 0.0–0.4)
Eos: 2 %
Hematocrit: 41.1 % (ref 37.5–51.0)
Hemoglobin: 14 g/dL (ref 13.0–17.7)
Immature Grans (Abs): 0.1 10*3/uL (ref 0.0–0.1)
Immature Granulocytes: 1 %
Lymphocytes Absolute: 2.5 10*3/uL (ref 0.7–3.1)
Lymphs: 35 %
MCH: 31.7 pg (ref 26.6–33.0)
MCHC: 34.1 g/dL (ref 31.5–35.7)
MCV: 93 fL (ref 79–97)
Monocytes Absolute: 0.6 10*3/uL (ref 0.1–0.9)
Monocytes: 8 %
Neutrophils Absolute: 3.8 10*3/uL (ref 1.4–7.0)
Neutrophils: 54 %
Platelets: 142 10*3/uL — ABNORMAL LOW (ref 150–450)
RBC: 4.41 x10E6/uL (ref 4.14–5.80)
RDW: 13.5 % (ref 11.6–15.4)
WBC: 7.1 10*3/uL (ref 3.4–10.8)

## 2021-03-05 LAB — HEMOGLOBIN A1C
Est. average glucose Bld gHb Est-mCnc: 194 mg/dL
Hgb A1c MFr Bld: 8.4 % — ABNORMAL HIGH (ref 4.8–5.6)

## 2021-03-05 LAB — BASIC METABOLIC PANEL
BUN/Creatinine Ratio: 14 (ref 10–24)
BUN: 14 mg/dL (ref 8–27)
CO2: 18 mmol/L — ABNORMAL LOW (ref 20–29)
Calcium: 9.3 mg/dL (ref 8.6–10.2)
Chloride: 96 mmol/L (ref 96–106)
Creatinine, Ser: 1.01 mg/dL (ref 0.76–1.27)
Glucose: 199 mg/dL — ABNORMAL HIGH (ref 65–99)
Potassium: 4 mmol/L (ref 3.5–5.2)
Sodium: 137 mmol/L (ref 134–144)
eGFR: 78 mL/min/{1.73_m2} (ref >59)

## 2021-03-05 LAB — LIPID PANEL
Chol/HDL Ratio: 4.8 ratio (ref 0.0–5.0)
Cholesterol, Total: 135 mg/dL (ref 100–199)
HDL: 28 mg/dL — ABNORMAL LOW (ref >39)
LDL Chol Calc (NIH): 69 mg/dL (ref 0–99)
Triglycerides: 230 mg/dL — ABNORMAL HIGH (ref 0–149)
VLDL Cholesterol Cal: 38 mg/dL (ref 5–40)

## 2021-03-05 LAB — MAGNESIUM: Magnesium: 1.6 mg/dL (ref 1.6–2.3)

## 2021-03-05 LAB — VITAMIN B12: Vitamin B-12: 582 pg/mL (ref 232–1245)

## 2021-03-05 NOTE — Progress Notes (Signed)
Please let patient know that his platelets are a little bit low.  I would like to repeat this next weke to make sure they return to normal.   Patient's A1c increased from 5.8 to 8.4.  I recommend he start a once weekly injectable called Trulicity.  I can show him how to do this in the office if he would like.  I also recommend he start Watchung 10mg  daily, this is a pill.  If patient agrees, I can send these medications to the pharmacy.  We will recheck A1c at next visit. I recommend decrease the amount of carbohydrates he is eating daily.   Patient's triglycerides are elevated from prior.  We will recheck this at next visit.  If it is still elevated, I will likely recommend we start a medication to help combat this.  I recommend decreasing processed foods and decrease sugar intake.  Patient's B12 and Magnesium are within the normal range.  Continue with current supplements.

## 2021-03-05 NOTE — Progress Notes (Signed)
Please let Juan Le know that I will get in touch with [name], the pharmacist, to figure out another option for his medications.  We will be in touch before I send something new for him.

## 2021-03-05 NOTE — Telephone Encounter (Signed)
Patient's A1c increased from 5.8 to 8.4.  I wanted to start [place] and Trulicity.  However, he states Tier 3 are not covered by insurance and wanted to get you involved to see what can be done to help him.

## 2021-03-06 NOTE — Addendum Note (Signed)
Addended by: Jon Billings on: 03/06/2021 11:34 AM   Modules accepted: Orders

## 2021-03-06 NOTE — Telephone Encounter (Signed)
Can you put in a referral so I can get him on schedule please?

## 2021-03-09 ENCOUNTER — Telehealth: Payer: Self-pay | Admitting: Pharmacist

## 2021-03-09 ENCOUNTER — Telehealth: Payer: Self-pay

## 2021-03-09 ENCOUNTER — Telehealth: Payer: Self-pay | Admitting: Nurse Practitioner

## 2021-03-09 MED ORDER — TRULICITY 0.75 MG/0.5ML ~~LOC~~ SOAJ: 0.7500 mg | SUBCUTANEOUS | 1 refills | Status: DC

## 2021-03-09 MED ORDER — DAPAGLIFLOZIN PROPANEDIOL 10 MG PO TABS: 10.0000 mg | ORAL_TABLET | Freq: Every day | ORAL | 1 refills | Status: DC

## 2021-03-09 NOTE — Telephone Encounter (Signed)
[name] accompanied wife, Juan Le to her in office appointment with today.  Jon Billings, NP previously referred him to me for assistance with medication cost barriers.H e has an upcoming appointment with me..  Patient signed PAP applications for Farxiga and Trulicity while in office today.  Vouchers given and collaborated with PCP to send prescriptions to [facility].

## 2021-03-09 NOTE — Chronic Care Management (AMB) (Signed)
  Chronic Care Management   Note  03/09/2021 Name: Juan Le. MRN: [id number] DOB: Nov 28, 1945  Juan Le. is a 75 y.o. year old male who is a primary care patient of Jon Billings, NP. Juan Le. is currently enrolled in care management services. An additional referral for Pharm D was placed.   Follow up plan: Unsuccessful telephone outreach attempt made. A HIPAA compliant phone message was left for the patient providing contact information and requesting a return call.  The care management team will reach out to the patient again over the next 7 days.  If patient returns call to provider office, please advise to call [facility]  at [place], [place], [place], [place] [address] Direct Dial: [phone] [email or web address] Website: [email or web address]

## 2021-03-09 NOTE — Telephone Encounter (Signed)
Medications sent to [facility].

## 2021-03-13 ENCOUNTER — Telehealth: Payer: Self-pay | Admitting: Nurse Practitioner

## 2021-03-13 ENCOUNTER — Other Ambulatory Visit: Payer: Self-pay | Admitting: Nurse Practitioner

## 2021-03-13 DIAGNOSIS — E1165 Type 2 diabetes mellitus with hyperglycemia: Secondary | ICD-10-CM

## 2021-03-13 DIAGNOSIS — E119 Type 2 diabetes mellitus without complications: Secondary | ICD-10-CM

## 2021-03-13 NOTE — Telephone Encounter (Signed)
Caller requesting glucose meter kit with lancets and strips. Pharmacy states they have sent out 2 request and unable to connect to PCP office. Caller would like script sent in today.    [place], [address] [place], [address] Phone:  [phone]  Fax:  [phone]

## 2021-03-13 NOTE — Telephone Encounter (Signed)
Patient called and asked is he requesting refills on the current meter listed on his current medication list or a new meter. He says he wants a different meter because this one is hard for him to work. He says he's using his wife's meter and it works better for him.   He would like: One Touch Verio Ultra 2 meter and testing supplies sent to [facility].  [place], [address] [place], [address] Phone:  [phone]  Fax:  [phone]

## 2021-03-13 NOTE — Chronic Care Management (AMB) (Signed)
  Chronic Care Management   Note  03/13/2021 Name: Juan Le. MRN: [id number] DOB: [birth year]  Juan Le. is a 75 y.o. year old male who is a primary care patient of Jon Billings, NP. Juan Le. is currently enrolled in care management services. An additional referral for Pharm D was placed.   Follow up plan: Telephone appointment with care management team member scheduled for:03/20/2021  Noreene Larsson, [place], [place], [place] [address] Direct Dial: [phone] [email or web address]

## 2021-03-13 NOTE — Telephone Encounter (Signed)
Rx request refused due to patient requesting a new meter. See TE dated today.

## 2021-03-17 NOTE — Telephone Encounter (Signed)
Ready for signature

## 2021-03-18 ENCOUNTER — Telehealth: Payer: Self-pay | Admitting: Pharmacist

## 2021-03-18 NOTE — Telephone Encounter (Signed)
Faxed

## 2021-03-18 NOTE — Chronic Care Management (AMB) (Signed)
Chronic Care Management Pharmacy Assistant   Name: Juan Le.  MRN: [id number] DOB: December 26, 1945  Juan Le. is an 75 y.o. year old male who presents for his initial CCM visit with the clinical pharmacist.  Reason for Encounter: Initial CCM visit    Recent office visits:  03/04/2021 Larae Grooms, NP (PCP) General follow up. Labs ordered Spirometry completed. Recommend he utilize CPAP at home. Recommended starting Trulicity and Farxiga. Recommend decreasing processed foods and decrease sugar intake. Follow up in 3 months. 12/05/2020 Aura Dials, NP. General follow up. Lads ordered. Start Vitamin B12 1000 mg daily. Follow up in 3 months.  Recent consult visits:  None noted  Hospital visits:  None in previous 6 months  Medications: Outpatient Encounter Medications as of 03/18/2021  Medication Sig  . aspirin 81 MG tablet Take 1 tablet by mouth daily.  Marland Kitchen atorvastatin (LIPITOR) 40 MG tablet TAKE 1 TABLET BY MOUTH  DAILY AT 6 PM  . Blood Glucose Monitoring Suppl (ONETOUCH VERIO FLEX SYSTEM) w/Device KIT USE UP TO 4 TIMES DAILY AS  DIRECTED  . cloNIDine (CATAPRES) 0.1 MG tablet Take 1 tablet (0.1 mg total) by mouth 2 (two) times daily.  . dapagliflozin propanediol (FARXIGA) 10 MG TABS tablet Take 1 tablet (10 mg total) by mouth daily before breakfast.  . Dulaglutide (TRULICITY) 0.75 MG/0.5ML SOPN Inject 0.75 mg into the skin once a week.  . gabapentin (NEURONTIN) 400 MG capsule TAKE 2 CAPSULES BY MOUTH 3  TIMES DAILY  . Lancets (ONETOUCH DELICA PLUS LANCET33G) MISC USE AS DIRECTED TO CHECK  BLOOD GLUCOSE ONCE DAILY  . metFORMIN (GLUCOPHAGE) 1000 MG tablet Take 1 tablet (1,000 mg total) by mouth 2 (two) times daily with a meal.  . omeprazole (PRILOSEC) 40 MG capsule Take 1 capsule (40 mg total) by mouth daily.  Letta Pate VERIO test strip USE AS INSTRUCTED  TO TEST  BLOOD SUGAR TWO TIMES DAILY  . QUEtiapine (SEROQUEL) 200 MG tablet Take 1 tablet (200 mg total) by  mouth at bedtime.  Marland Kitchen telmisartan (MICARDIS) 40 MG tablet Take 1 tablet (40 mg total) by mouth daily.   No facility-administered encounter medications on file as of 03/18/2021.    aspirin 81 MG tablet Last filled: None noted atorvastatin (LIPITOR) 40 MG tablet Last filled: 01/25/2021 90 DS cloNIDine (CATAPRES) 0.1 MG tablet Last filled: 01/11/2021 90 DS dapagliflozin propanediol (FARXIGA) 10 MG TABS tablet Last filled: 03/09/2021 30 DS Dulaglutide (TRULICITY) 0.75 MG/0.5ML SOPN Last filled: 03/09/2021 28 DS gabapentin (NEURONTIN) 400 MG capsule Last filled: 01/25/2021 90 DS metFORMIN (GLUCOPHAGE) 1000 MG tablet Last filled: 12/05/2020 90 DS omeprazole (PRILOSEC) 40 MG capsule Last filled: 03/02/2021 90 DS QUEtiapine (SEROQUEL) 200 MG tablet Last filled: 02/10/2021 90DS telmisartan (MICARDIS) 40 MG tablet Last filled: 01/01/2021 90DS  Have you seen any other providers since your last visit?  Patient states he has not seen other providers since his last visit.  Any changes in your medications or health?  Patient states there is no changes in his medications or health.  Any side effects from any medications?  Patient states he does not have any side effects from his medications.  Do you have an symptoms or problems not managed by your medications?  Patient states he does not have any symptoms or problems not managed by his medications.  Any concerns about your health right now?  Patient states there is nothing at this time.  Has your provider asked that you check blood pressure,  blood sugar, or follow special diet at home? Patient states his provider does recommend for him to check his blood pressure twice daily.  Do you get any type of exercise on a regular basis? Patient states he does not exercise much.  Can you think of a goal you would like to reach for your health?  Patient states just to keep staying healthy.  Do you have any problems getting your medications?  Patient  states he does not have any problems getting his medications.  Is there anything that you would like to discuss during the appointment?  Patient states there is nothing at this time.  Completed patient assistance application for [place] and Trulicity. Mailed to patient. Patient aware to complete forms and mail back to PCP's office.   Star Rating Drugs: Telmisartan (MICARDIS) 40 MG tablet Last filled: 01/01/2021 90DS MetFORMIN (GLUCOPHAGE) 1000 MG tablet Last filled: 12/05/2020 90 DS Dulaglutide (TRULICITY) 0.97 DZ/3.2DJ SOPN Last filled: 03/09/2021 28 DS Atorvastatin (LIPITOR) 40 MG tablet Last filled: 01/25/2021 90 DS Dapagliflozin propanediol (FARXIGA) 10 MG TABS tablet Last filled: 03/09/2021 30 DS  [facility] Clinical Pharmacist Assistant [phone]

## 2021-03-20 ENCOUNTER — Ambulatory Visit (INDEPENDENT_AMBULATORY_CARE_PROVIDER_SITE_OTHER): Payer: Medicare Other | Admitting: Pharmacist

## 2021-03-20 DIAGNOSIS — I152 Hypertension secondary to endocrine disorders: Secondary | ICD-10-CM | POA: Diagnosis not present

## 2021-03-20 DIAGNOSIS — I700 Atherosclerosis of aorta: Secondary | ICD-10-CM

## 2021-03-20 DIAGNOSIS — E1165 Type 2 diabetes mellitus with hyperglycemia: Secondary | ICD-10-CM | POA: Diagnosis not present

## 2021-03-20 DIAGNOSIS — E1159 Type 2 diabetes mellitus with other circulatory complications: Secondary | ICD-10-CM

## 2021-03-20 NOTE — Patient Instructions (Addendum)
Visit Information  It was a pleasure speaking with you today! Thank you for letting me be a part of your care team. Please call with any questions or concerns.  Goals Addressed            This Visit's Progress   . PharmD Improve My Heart Health-Coronary Artery Disease       Timeframe:  Short-Term Goal Priority:  High Start Date:                             Expected End Date:                       Follow Up Date 2 month follow up    - be open to making changes - I can manage, know and watch for signs of a heart attack - if I have chest pain, call for help - learn about small changes that will make a big difference    Why is this important?    Lifestyle changes are key to improving the blood flow to your heart. Think about the things you can change and set a goal to live healthy.   Remember, when the blood vessels to your heart start to get clogged you may not have any symptoms.   Over time, they can get worse.   Don't ignore the signs, like chest pain, and get help right away.     Notes:     . PharmD Monitor and Manage My Blood Sugar-Diabetes Type 2       Timeframe:  Short-Term Goal Priority:  High Start Date:                             Expected End Date:                       Follow Up Date 2 month follow up    - check blood sugar at prescribed times - check blood sugar if I feel it is too high or too low - enter blood sugar readings and medication or insulin into daily log - take the blood sugar log to all doctor visits - take the blood sugar meter to all doctor visits    Why is this important?    Checking your blood sugar at home helps to keep it from getting very high or very low.   Writing the results in a diary or log helps the doctor know how to care for you.   Your blood sugar log should have the time, date and the results.   Also, write down the amount of insulin or other medicine that you take.   Other information, like what you ate, exercise done  and how you were feeling, will also be helpful.     Notes:        Juan Le was given information about Chronic Care Management services today including:  1. CCM service includes personalized support from designated clinical staff supervised by his physician, including individualized plan of care and coordination with other care providers 2. 24/7 contact phone numbers for assistance for urgent and routine care needs. 3. Standard insurance, coinsurance, copays and deductibles apply for chronic care management only during months in which we provide at least 20 minutes of these services. Most insurances cover these services at 100%, however patients may be responsible for any  copay, coinsurance and/or deductible if applicable. This service may help you avoid the need for more expensive face-to-face services. 4. Only one practitioner may furnish and bill the service in a calendar month. 5. The patient may stop CCM services at any time (effective at the end of the month) by phone call to the office staff.  Patient agreed to services and verbal consent obtained.   The patient verbalized understanding of instructions, educational materials, and care plan provided today and agreed to receive a mailed copy of patient instructions, educational materials, and care plan.  Telephone follow up appointment with pharmacy team member scheduled for: CPA 2-4 weeks, PharmD 2 months  Junita Push. [place] PharmD, BCPS Clinical Pharmacist [phone]  Preventing Diabetes Mellitus Complications You can help to prevent or slow down problems that are caused by diabetes (diabetes mellitus). Following your diabetes plan and taking care of yourself can reduce your risk of serious or life-threatening complications. What actions can I take to prevent diabetes complications? Diabetes management  Follow instructions from your health care providers about managing your diabetes. Your diabetes may be managed by a team of  health care providers who can teach you how to care for yourself and can answer questions that you have.  Educate yourself about your condition so you can make healthy choices about eating and physical activity.  Know your target range for your blood sugar (glucose), and check your blood glucose level as often as told. Your health care provider will help you decide how often to check your blood glucose level depending on your treatment goals and how well you are meeting them.  Ask your health care provider if you should take low-dose aspirin daily and what dose is recommended for you. Taking low-dose aspirin daily is recommended to help prevent cardiovascular disease.   Controlling your blood pressure and cholesterol Your personal target blood pressure is determined based on:  Your age.  Your medicines.  How long you have had diabetes.  Any other medical conditions you have. To control your blood pressure:  Follow instructions from your health care provider about meal planning, exercise, and medicines.  Make sure your health care provider checks your blood pressure at every medical visit.  Monitor your blood pressure at home as told by your health care provider. To control your cholesterol:  Follow instructions from your health care provider about meal planning, exercise, and medicines.  Have your cholesterol checked at least once a year.  You may be prescribed medicine to lower cholesterol (statin). If you are not taking a statin, ask your health care provider if you should be. Controlling your cholesterol may:  Help prevent heart disease and stroke. These are the most common health problems for people with diabetes.  Improve your blood flow.   Medical appointments and vaccines Schedule and keep yearly physical exams and eye exams. Your health care provider will tell you how often you need medical visits depending on your diabetes management plan. Keep all follow-up visits as  told. This is important so possible problems can be identified early and complications can be avoided or treated.  Every visit with your health care provider should include measuring your: ? Weight. ? Blood pressure. ? Blood glucose control.  Your A1C (hemoglobin A1C) level should be checked: ? At least 2 times a year, if you are meeting your treatment goals. ? 4 times a year, if you are not meeting treatment goals or if your treatment goals have changed.  Your blood lipids (  lipid profile) should be checked yearly. You should also be checked yearly for protein in your urine (urine microalbumin).  If you have type 1 diabetes, get an eye exam 3-5 years after you are diagnosed, and then once a year after your first exam.  If you have type 2 diabetes, get an eye exam as soon as you are diagnosed, and then once a year after your first exam. It is also important to keep your vaccines current. It is recommended that you receive:  A flu (influenza) vaccine every year.  A pneumonia (pneumococcal) vaccine and a hepatitis B vaccine. If you are age 54 or older, you may get the pneumonia vaccine as a series of two separate shots. Ask your health care provider which other vaccines may be recommended. Lifestyle  Do not use any products that contain nicotine or tobacco, such as cigarettes, e-cigarettes, and chewing tobacco. If you need help quitting, ask your health care provider. By avoiding nicotine and tobacco: ? You will lower your risk for heart attack, stroke, nerve disease, and kidney disease. ? Your cholesterol and blood pressure may improve. ? Your blood circulation will improve.  If you drink alcohol: ? Limit how much you use to:  0-1 drink a day for women who are not pregnant.  0-2 drinks a day for men. ? Be aware of how much alcohol is in your drink. In the [place] one drink equals one 12 oz bottle of beer (355 mL), one 5 oz glass of wine (148 mL), or one 11?2 oz glass of hard liquor (44  mL). Taking care of your feet Diabetes may cause you to have poor blood circulation to your legs and feet. Because of this, taking care of your feet is very important. Diabetes can cause:  The skin on the feet to get thinner, break more easily, and heal more slowly.  Nerve damage in your legs and feet, which results in decreased feeling. You may not notice minor injuries that could lead to serious problems. To avoid foot problems:  Check your skin and feet every day for cuts, bruises, redness, blisters, or sores.  Schedule a foot exam with your health care provider once every year. This exam includes: ? Inspecting the structure and skin of your feet. ? Checking the pulses and sensation in your feet.  Make sure that your health care provider performs a visual foot exam at every medical visit.   Taking care of your teeth People with poorly controlled diabetes are more likely to have gum (periodontal) disease. Diabetes can make periodontal diseases harder to control. If not treated, periodontal diseases can lead to tooth loss. To prevent this:  Brush your teeth twice a day.  Floss at least once a day.  Visit your dentist 2 times a year. Managing stress Living with diabetes can be stressful. When you are experiencing stress, your blood glucose may be affected in two ways:  Stress hormones may cause your blood glucose to rise.  You may be distracted from taking good care of yourself. Be aware of your stress level and make changes to help you manage challenging situations. To lower your stress levels:  Consider joining a support group.  Do planned relaxation or meditation.  Do a hobby that you enjoy.  Maintain healthy relationships.  Exercise regularly.  Work with your health care provider or a mental health professional. Where to find more information  American Diabetes Association: www.diabetes.org  Association of Diabetes Care and Education Specialists:  www.diabeteseducator.org Summary  You can take action to prevent or slow down problems that are caused by diabetes (diabetes mellitus). Following your diabetes plan and taking care of yourself can reduce your risk of serious or life-threatening complications.  Follow instructions from your health care providers about managing your diabetes. Your diabetes may be managed by a team of health care providers who can teach you how to care for yourself and can answer questions that you have.  Know your target range for your blood sugar (glucose), and check your blood glucose levels as often as told. Your health care provider will help you decide how often you should check your blood glucose level depending on your treatment goals and how well you are meeting them.  Your health care provider will tell you how often you need medical visits depending on your diabetes management plan. Keep all follow-up visits as directed. This is important so possible problems can be identified early and complications can be avoided or treated. This information is not intended to replace advice given to you by your health care provider. Make sure you discuss any questions you have with your health care provider. Document Revised: 11/23/2019 Document Reviewed: 11/23/2019 Elsevier Patient Education  2021 [place] for Chronic Kidney Disease Chronic kidney disease (CKD) occurs when the kidneys are permanently damaged over a long period of time. When your kidneys are not working well, they cannot remove waste, fluids, and other substances from your blood as well as they did before. The substances can build up, which can worsen kidney damage and affect how your body functions. Certain foods lead to a buildup of these substances. By changing your diet, you can help prevent more kidney damage and delay or prevent the need for dialysis. What are tips for following this plan? Reading food labels  Check the amount  of salt (sodium) in foods. Choose foods that have less than 300 milligrams (mg) per serving.  Check the ingredient list for phosphorus or potassium-based additives or preservatives.  Check the amount of saturated fat and trans fat. Limit or avoid these fats as told by your dietitian. Shopping  Avoid buying foods that are: ? Processed or prepackaged. ? Calcium-enriched or that have calcium added to them (are fortified).  Do not buy foods that have salt or sodium listed among the first five ingredients.  Buy canned vegetables and beans that say "no salt added" or "low sodium" and rinse them before eating. Cooking  Soak vegetables, such as potatoes, before cooking to reduce potassium. To do this: 1. Peel and cut the vegetables into small pieces. 2. Soak the vegetables in warm water for at least 2 hours. For every 1 cup of vegetables, use 10 cups of water. 3. Drain and rinse the vegetables with warm water. 4. Boil the vegetables for at least 5 minutes. Meal planning  Limit the amount of protein you eat from plant and animal sources each day.  Do not add salt to food when cooking or before eating.  Eat meals and snacks at around the same time each day. General information  Talk with your health care provider about whether you should take a vitamin and mineral supplement.  Use standard measuring cups and spoons to measure servings of foods. Use a kitchen scale to measure portions of protein foods.  If told by your health care provider, avoid drinking too much fluid. Measure and count all liquids, including water, ice, soups, flavored gelatin, and frozen desserts such as ice pops or  ice cream. If you have diabetes:  If you have diabetes (diabetes mellitus) and CKD, it is important to keep your blood sugar (glucose) in the target range recommended by your health care provider. Follow your diabetes management plan. This may include: ? Checking your blood glucose regularly. ? Taking  medicines by mouth, taking insulin, or taking both. ? Exercising for at least 30 minutes on 5 or more days each week, or as told by your health care provider. ? Tracking how many servings of carbohydrates you eat at each meal.  You may be given specific guidelines on how much of certain foods and nutrients you may eat, depending on your stage of kidney disease and whether you have high blood pressure (hypertension). Follow your meal plan as told by your dietitian. What nutrients should I limit? Work with your health care provider and dietitian to develop a meal plan that is right for you. Foods you can eat and foods you should limit or avoid will depend on the stage of your kidney disease and any other health conditions you have. The items listed below are not a complete list. Talk with your dietitian about what dietary choices are best for you. Potassium Potassium affects how steadily your heart beats. If too much potassium builds up in your blood, the potassium can cause an irregular heartbeat or even a heart attack. You may need to limit or avoid foods that are high in potassium, such as:  Milk and soy milk.  Fruits, such as bananas, apricots, nectarines, melon, prunes, raisins, kiwi, and oranges.  Vegetables, such as potatoes, sweet potatoes, yams, tomatoes, leafy greens, beets, avocado, pumpkin, and winter squash.  White and lima beans.  Whole-wheat breads and pastas.  Beans and nuts. Phosphorus Phosphorus is a mineral found in your bones. A balance between calcium and phosphorus is needed to build and maintain healthy bones. Too much phosphorus pulls calcium from your bones. This can make your bones weak and more likely to break. Too much phosphorus can also make your skin itch. You may need to limit or avoid foods that are high in phosphorus, such as:  Milk and dairy products.  Dried beans and peas.  Tofu, soy milk, and other soy-based meat replacements.  Dark-colored  sodas.  Nuts and peanut butter.  Meat, poultry, and fish.  Bran cereals and oatmeal. Protein Protein helps you make and keep muscle. It also helps to repair your body's cells and tissues. One of the natural breakdown products of protein is a waste product called urea. When your kidneys are not working properly, they cannot remove wastes, such as urea. Reducing how much protein you eat can help prevent a buildup of urea in your blood. Depending on your stage of kidney disease, you may need to limit foods that are high in protein. Sources of animal protein include:  Meat (all types).  Fish and seafood.  Poultry.  Eggs.  Dairy. Other protein foods include:  Beans and legumes.  Nuts and nut butter.  Soy and tofu.   Sodium Sodium helps to maintain a healthy balance of fluids in your body. Too much sodium can increase your blood pressure and have a negative effect on your heart and lungs. Too much sodium can also cause your body to retain too much fluid, making your kidneys work harder. Most people should have less than 2,300 mg of sodium each day. If you have hypertension, you may need to limit your sodium to 1,500 mg each day. You may  need to limit or avoid foods that are high in sodium, such as:  Salt seasonings.  Soy sauce.  Cured and processed meats.  Salted crackers and snack foods.  Fast food.  Canned soups and most canned foods.  Pickled foods.  Vegetable juice.  Boxed mixes or ready-to-eat boxed meals and side dishes.  Bottled dressings, sauces, and marinades. Talk with your dietitian about how much potassium, phosphorus, protein, and sodium you may have each day. Summary  Chronic kidney disease (CKD) can lead to a buildup of waste and extra substances in the body. Certain foods lead to a buildup of these substances. By changing your diet as told, you can help prevent more kidney damage and delay or prevent the need for dialysis.  Food intake changes are  different for each person with CKD. Work with a dietitian to set up nutrient goals and a meal plan that is right for you.  If you have diabetes and CKD, it is important to keep your blood sugar in the target range recommended by your health care provider. This information is not intended to replace advice given to you by your health care provider. Make sure you discuss any questions you have with your health care provider. Document Revised: 01/28/2020 Document Reviewed: 01/28/2020 Elsevier Patient Education  2021 [place] for Chronic Kidney Disease Chronic kidney disease (CKD) occurs when the kidneys are permanently damaged over a long period of time. When your kidneys are not working well, they cannot remove waste, fluids, and other substances from your blood as well as they did before. The substances can build up, which can worsen kidney damage and affect how your body functions. Certain foods lead to a buildup of these substances. By changing your diet, you can help prevent more kidney damage and delay or prevent the need for dialysis. What are tips for following this plan? Reading food labels  Check the amount of salt (sodium) in foods. Choose foods that have less than 300 milligrams (mg) per serving.  Check the ingredient list for phosphorus or potassium-based additives or preservatives.  Check the amount of saturated fat and trans fat. Limit or avoid these fats as told by your dietitian. Shopping  Avoid buying foods that are: ? Processed or prepackaged. ? Calcium-enriched or that have calcium added to them (are fortified).  Do not buy foods that have salt or sodium listed among the first five ingredients.  Buy canned vegetables and beans that say "no salt added" or "low sodium" and rinse them before eating. Cooking  Soak vegetables, such as potatoes, before cooking to reduce potassium. To do this: 1. Peel and cut the vegetables into small pieces. 2. Soak the  vegetables in warm water for at least 2 hours. For every 1 cup of vegetables, use 10 cups of water. 3. Drain and rinse the vegetables with warm water. 4. Boil the vegetables for at least 5 minutes. Meal planning  Limit the amount of protein you eat from plant and animal sources each day.  Do not add salt to food when cooking or before eating.  Eat meals and snacks at around the same time each day. General information  Talk with your health care provider about whether you should take a vitamin and mineral supplement.  Use standard measuring cups and spoons to measure servings of foods. Use a kitchen scale to measure portions of protein foods.  If told by your health care provider, avoid drinking too much fluid. Measure and count  all liquids, including water, ice, soups, flavored gelatin, and frozen desserts such as ice pops or ice cream. If you have diabetes:  If you have diabetes (diabetes mellitus) and CKD, it is important to keep your blood sugar (glucose) in the target range recommended by your health care provider. Follow your diabetes management plan. This may include: ? Checking your blood glucose regularly. ? Taking medicines by mouth, taking insulin, or taking both. ? Exercising for at least 30 minutes on 5 or more days each week, or as told by your health care provider. ? Tracking how many servings of carbohydrates you eat at each meal.  You may be given specific guidelines on how much of certain foods and nutrients you may eat, depending on your stage of kidney disease and whether you have high blood pressure (hypertension). Follow your meal plan as told by your dietitian. What nutrients should I limit? Work with your health care provider and dietitian to develop a meal plan that is right for you. Foods you can eat and foods you should limit or avoid will depend on the stage of your kidney disease and any other health conditions you have. The items listed below are not a complete  list. Talk with your dietitian about what dietary choices are best for you. Potassium Potassium affects how steadily your heart beats. If too much potassium builds up in your blood, the potassium can cause an irregular heartbeat or even a heart attack. You may need to limit or avoid foods that are high in potassium, such as:  Milk and soy milk.  Fruits, such as bananas, apricots, nectarines, melon, prunes, raisins, kiwi, and oranges.  Vegetables, such as potatoes, sweet potatoes, yams, tomatoes, leafy greens, beets, avocado, pumpkin, and winter squash.  White and lima beans.  Whole-wheat breads and pastas.  Beans and nuts. Phosphorus Phosphorus is a mineral found in your bones. A balance between calcium and phosphorus is needed to build and maintain healthy bones. Too much phosphorus pulls calcium from your bones. This can make your bones weak and more likely to break. Too much phosphorus can also make your skin itch. You may need to limit or avoid foods that are high in phosphorus, such as:  Milk and dairy products.  Dried beans and peas.  Tofu, soy milk, and other soy-based meat replacements.  Dark-colored sodas.  Nuts and peanut butter.  Meat, poultry, and fish.  Bran cereals and oatmeal. Protein Protein helps you make and keep muscle. It also helps to repair your body's cells and tissues. One of the natural breakdown products of protein is a waste product called urea. When your kidneys are not working properly, they cannot remove wastes, such as urea. Reducing how much protein you eat can help prevent a buildup of urea in your blood. Depending on your stage of kidney disease, you may need to limit foods that are high in protein. Sources of animal protein include:  Meat (all types).  Fish and seafood.  Poultry.  Eggs.  Dairy. Other protein foods include:  Beans and legumes.  Nuts and nut butter.  Soy and tofu.   Sodium Sodium helps to maintain a healthy  balance of fluids in your body. Too much sodium can increase your blood pressure and have a negative effect on your heart and lungs. Too much sodium can also cause your body to retain too much fluid, making your kidneys work harder. Most people should have less than 2,300 mg of sodium each day. If you  have hypertension, you may need to limit your sodium to 1,500 mg each day. You may need to limit or avoid foods that are high in sodium, such as:  Salt seasonings.  Soy sauce.  Cured and processed meats.  Salted crackers and snack foods.  Fast food.  Canned soups and most canned foods.  Pickled foods.  Vegetable juice.  Boxed mixes or ready-to-eat boxed meals and side dishes.  Bottled dressings, sauces, and marinades. Talk with your dietitian about how much potassium, phosphorus, protein, and sodium you may have each day. Summary  Chronic kidney disease (CKD) can lead to a buildup of waste and extra substances in the body. Certain foods lead to a buildup of these substances. By changing your diet as told, you can help prevent more kidney damage and delay or prevent the need for dialysis.  Food intake changes are different for each person with CKD. Work with a dietitian to set up nutrient goals and a meal plan that is right for you.  If you have diabetes and CKD, it is important to keep your blood sugar in the target range recommended by your health care provider. This information is not intended to replace advice given to you by your health care provider. Make sure you discuss any questions you have with your health care provider. Document Revised: 01/28/2020 Document Reviewed: 01/28/2020 Elsevier Patient Education  2021 [employer].

## 2021-03-20 NOTE — Progress Notes (Signed)
Chronic Care Management Pharmacy Note  03/20/2021 Name:  Juan Le. MRN:  [id number] DOB:  1945/10/27  Summary: Uncontrolled DM2 last A1c 8.4. Started Farxiga 10 and Trulicity 3.50 mg weekly with vouchers. Patient assistance applications in process. Reports checking BG bid at home since starting new medications with readings 130- 160. Hypertension controlled on current regimen. Unclear if patient previously failed thiazide diuretic or  Long acting dihydropyridine CCB prior to clonidine. Purchased new wrist cuff and has been checking most days reporting values <130/80.   Recommendations/Changes made from today's visit: Recommend follow-up with cardiology given significant CV history. Consider tapering off clonidine if BP remains controlled. Recommend discontinuing Seroquel due to concern for metabolic syndrome and not indicated for insomnia. Consider amitriptyline as alternative. Recommend follow up vitamin D level with next labs.  Plan: CPA to check on BP/BG readings and follow up on PAP status in 2-4 weeks. PharmD to follow up in 2 months or sooner as indicated.  Subjective: Juan Le. is an 75 y.o. year old male who is a primary patient of Jon Billings, NP.  The CCM team was consulted for assistance with disease management and care coordination needs.    Engaged with patient by telephone for initial visit in response to provider referral for pharmacy case management and/or care coordination services.   Consent to Services:  The patient was given the following information about Chronic Care Management services today, agreed to services, and gave verbal consent: 1. CCM service includes personalized support from designated clinical staff supervised by the primary care provider, including individualized plan of care and coordination with other care providers 2. 24/7 contact phone numbers for assistance for urgent and routine care needs. 3. Service will only be billed when  office clinical staff spend 20 minutes or more in a month to coordinate care. 4. Only one practitioner may furnish and bill the service in a calendar month. 5.The patient may stop CCM services at any time (effective at the end of the month) by phone call to the office staff. 6. The patient will be responsible for cost sharing (co-pay) of up to 20% of the service fee (after annual deductible is met). Patient agreed to services and consent obtained.  Patient Care Team: Jon Billings, NP as PCP - General Anell Barr, OD as Consulting Physician (Optometry) Ernestine Conrad Gordan Payment as Physician Assistant (Urology) Laverle Hobby, MD as Consulting Physician (Pulmonary Disease) Cathi Roan, [facility] (Pharmacist) Vladimir Faster, [facility] (Pharmacist)  Recent office visits: 03/04/2021 Jon Billings, NP (PCP) General follow up. Labs ordered Spirometry completed. Recommend he utilize CPAP at home. Recommended starting Trulicity and Farxiga. Recommend decreasing processed foods and decrease sugar intake. Follow up in 3 months. 12/05/2020 Marnee Guarneri, NP. General follow up. Lads ordered. Start Vitamin B12 1000 mg daily. Follow up in 3 months.18/2022 Jon Billings, NP (PCP) General follow up. Labs ordered Spirometry completed. Recommend he utilize CPAP at home. Recommended starting Trulicity and Farxiga. Recommend decreasing processed foods and decrease sugar intake. Follow up in 3 months. 12/05/2020 Marnee Guarneri, NP. General follow up. Lads ordered. Start Vitamin B12 1000 mg daily. Follow up in 3 months.  Recent consult visits: None noted  Hospital visits: None in previous 6 months   Objective:  Lab Results  Component Value Date   CREATININE 1.01 03/04/2021   BUN 14 03/04/2021   GFRNONAA 79 12/05/2020   GFRAA 91 12/05/2020   NA 137 03/04/2021   K 4.0 03/04/2021  CALCIUM 9.3 03/04/2021   CO2 18 (L) 03/04/2021   GLUCOSE 199 (H) 03/04/2021    Lab Results  Component Value  Date/Time   HGBA1C 8.4 (H) 03/04/2021 09:21 AM   HGBA1C 6.9 12/05/2020 10:44 AM   HGBA1C 7.0 (H) 09/04/2020 08:37 AM   MICROALBUR 30 (H) 12/05/2020 10:44 AM   MICROALBUR 30 (H) 06/11/2020 02:23 PM    Last diabetic Eye exam:  Lab Results  Component Value Date/Time   HMDIABEYEEXA No Retinopathy 02/04/2021 09:10 AM    Last diabetic Foot exam: No results found for: HMDIABFOOTEX   Lab Results  Component Value Date   CHOL 135 03/04/2021   HDL 28 (L) 03/04/2021   LDLCALC 69 03/04/2021   TRIG 230 (H) 03/04/2021   CHOLHDL 4.8 03/04/2021    Hepatic Function Latest Ref Rng & Units 11/29/2019 12/13/2018 11/29/2017  Total Protein 6.0 - 8.5 g/dL 6.7 7.0 7.7  Albumin 3.7 - 4.7 g/dL 4.2 4.6 -  AST 0 - 40 IU/L _0 ALT 0 - 44 IU/L 44 37 22  Alk Phosphatase 39 - 117 IU/L 75 63 -  Total Bilirubin 0.0 - 1.2 mg/dL 0.3 0.7 0.6  Bilirubin, Direct 0.00 - 0.40 mg/dL - - -    Lab Results  Component Value Date/Time   TSH 2.670 09/04/2020 09:23 AM   TSH 2.60 01/17/2017 10:00 AM    CBC Latest Ref Rng & Units 03/04/2021 09/04/2020 03/24/2017  WBC 3.4 - 10.8 x10E3/uL 7.1 7.5 7.1  Hemoglobin 13.0 - 17.7 g/dL 14.0 14.4 14.5  Hematocrit 37.5 - 51.0 % 41.1 42.9 42.3  Platelets 150 - 450 x10E3/uL 142(L) 160 159    Lab Results  Component Value Date/Time   VD25OH 14 (L) 01/17/2017 10:00 AM   VD25OH 8 (L) 07/29/2016 08:57 AM    Clinical ASCVD: Yes  The ASCVD Risk score Mikey Bussing DC Jr., et al., 2013) failed to calculate for the following reasons:   The patient has a prior MI or stroke diagnosis    Depression screen [facility] 2/9 03/04/2021 03/10/2020 12/17/2019  Decreased Interest 0 0 0  Down, Depressed, Hopeless 0 0 0  PHQ - 2 Score 0 0 0  Altered sleeping - 0 -  Tired, decreased energy - 0 -  Change in appetite - 0 -  Feeling bad or failure about yourself  - 0 -  Trouble concentrating - 0 -  Moving slowly or fidgety/restless - 0 -  Suicidal thoughts - 0 -  PHQ-9 Score - 0 -  Difficult doing  work/chores - - -  Some recent data might be hidden       Social History   Tobacco Use  Smoking Status Former Smoker  . Types: Cigarettes  Smokeless Tobacco Never Used  Tobacco Comment   former light smoker; quit in his teens   BP Readings from Last 3 Encounters:  03/04/21 (!) 142/78  12/05/20 121/79  09/04/20 120/68   Pulse Readings from Last 3 Encounters:  03/04/21 82  12/05/20 62  09/04/20 71   Wt Readings from Last 3 Encounters:  03/04/21 231 lb 6 oz (105 kg)  12/05/20 230 lb 12.8 oz (104.7 kg)  09/04/20 231 lb (104.8 kg)   BMI Readings from Last 3 Encounters:  03/04/21 33.68 kg/m  12/05/20 33.59 kg/m  09/04/20 33.62 kg/m    Assessment/Interventions: Review of patient past medical history, allergies, medications, health status, including review of consultants reports, laboratory and other test data, was performed as part of comprehensive  evaluation and provision of chronic care management services.   SDOH:  (Social Determinants of Health) assessments and interventions performed: Yes SDOH Interventions   Flowsheet Row Most Recent Value  SDOH Interventions   Financial Strain Interventions Other (Comment)  [patient assistance]     SDOH Screenings   Alcohol Screen: Not on file  Depression (PHQ2-9): Low Risk   . PHQ-2 Score: 0  Financial Resource Strain: Low Risk   . Difficulty of Paying Living Expenses: Not hard at all  Food Insecurity: Not on file  Housing: Not on file  Physical Activity: Not on file  Social Connections: Not on file  Stress: Not on file  Tobacco Use: Medium Risk  . Smoking Tobacco Use: Former Smoker  . Smokeless Tobacco Use: Never Used  Transportation Needs: Not on file      Immunization History  Administered Date(s) Administered  . Fluad Quad(high Dose 65+) 06/11/2019  . Influenza, High Dose Seasonal PF 07/17/2015, 07/29/2016, 07/15/2017  . Influenza, Seasonal, Injecte, Preservative Fre 08/14/2008  . Influenza,inj,Quad PF,6+  Mos 07/20/2013, 06/20/2014  . Influenza,inj,quad, With Preservative 08/18/2018  . Influenza-Unspecified 08/18/2018, 08/07/2020  . Moderna Sars-Covid-2 Vaccination 11/28/2019, 12/26/2019, 09/17/2020  . Pneumococcal Conjugate-13 08/15/2014  . Pneumococcal Polysaccharide-23 05/11/2012  . Tdap 05/29/2014    Conditions to be addressed/monitored:  Hypertension, Hyperlipidemia, Diabetes, Coronary Artery Disease, GERD, COPD and Chronic pain  Care Plan : [place]  Updates made by Vladimir Faster, [place] since 03/20/2021 12:00 AM    Problem: DM2, HTN, CKD, Neuropathy, COPD, HLD, CAD, MI   Priority: High    Goal: Disease Management   This Visit's Progress: On track  Priority: High  Note:   Current Barriers:  . Unable to independently afford treatment regimen . Unable to independently monitor therapeutic efficacy . Unable to achieve control of diabetes   Pharmacist Clinical Goal(s):  Marland Kitchen Patient will verbalize ability to afford treatment regimen . achieve adherence to monitoring guidelines and medication adherence to achieve therapeutic efficacy . maintain control of diabetes as evidenced by HbA1c and SMBG values  . adhere to prescribed medication regimen as evidenced by fill dates through collaboration with PharmD and provider.   Interventions: . 1:1 collaboration with Jon Billings, NP regarding development and update of comprehensive plan of care as evidenced by provider attestation and co-signature . Inter-disciplinary care team collaboration (see longitudinal plan of care) . Comprehensive medication review performed; medication list updated in electronic medical record BP Readings from Last 3 Encounters:  03/04/21 (!) 142/78  12/05/20 121/79  09/04/20 120/68   Lab Results  Component Value Date   CREATININE 1.01 03/04/2021    Hypertension/CKD (BP goal <130/80) -Not ideally controlled -Current treatment: . telmisartan 40 mg qd . Clonidine 0.1 mg bid -Medications  previously tried: ace- cough  -Current home readings: 128/60 purchased new wrist cuff -Current dietary habits: mid morning and dinner, eggs & sausage , meatloaf, pintos, vegetables, cook most meals at home  drinks diet soda -Current exercise habits: no structure  -Denies hypotensive/hypertensive symptoms -Educated on BP goals and benefits of medications for prevention of heart attack, stroke and kidney damage; Daily salt intake goal < 2300 mg; Exercise goal of 150 minutes per week; Symptoms of hypotension and importance of maintaining adequate hydration; -Counseled to monitor BP at home three times weekly, document, and provide log at future appointments -Counseled on diet and exercise extensively -Consider changing  clonidine to more preferred agent given significant CV disease.  -Recommend patient follow up with Cardiology.  Lab Results  Component Value Date   CHOL 135 03/04/2021   HDL 28 (L) 03/04/2021   LDLCALC 69 03/04/2021   TRIG 230 (H) 03/04/2021   CHOLHDL 4.8 03/04/2021    Hyperlipidemia/CAD/h/o MI, CABG, multiple stents: (LDL goal < 70) -Not ideally controlled -Current treatment: . Atorvastatin 40 mg qd . Aspirin 81 mg qd -Medications previously tried: NA --Educated on Cholesterol goals;  Benefits of statin for ASCVD risk reduction; Importance of limiting foods high in cholesterol; Exercise goal of 150 minutes per week; -Counseled on diet and exercise extensively Recommended to continue current medication -Recommend patient follow up with cardiology -Recommend follow up vitamin level and treatment as indicated to help prevent myalgias. -Recommend discontinue seroquel for insomnia and could be contributing to hypertriglyceridemia  Lab Results  Component Value Date   HGBA1C 8.4 (H) 03/04/2021  eGFR~78 ml/min  Lab Results  Component Value Date   VITAMINB12 582 03/04/2021    Diabetes/Diabetic Neuropathy (A1c goal <7%) -Uncontrolled -Current  medications: . Trulicity 8.93 mg qd . Metformin 1000 mg bid . Farxiga 10 mg qd . Gabapentin 800 mg tid -Medications previously tried: NA -Current home glucose readings . fasting glucose: 130- 160 . post prandial glucose: Not checking, checks mid afternoon and mornings -Denies hypoglycemic/hyperglycemic symptoms -Educated on A1c and blood sugar goals; Complications of diabetes including kidney damage, retinal damage, and cardiovascular disease; Exercise goal of 150 minutes per week; Prevention and management of hypoglycemic episodes; Benefits of routine self-monitoring of blood sugar; Reviewed Trulicity injection instructions and showed educational video in office last week with wife.  Reviewed importance of checking feet daily, wearing properly fitting shoes for neuropathy.  -Counseled to check feet daily and get yearly eye exams -Counseled on diet and exercise extensively Recommended to continue current medication Assessed patient finances. Patient assistance applications for [place] and Trulicity in process. Vouchers given last week in office with wife.  COPD/OSA (Goal: control symptoms and prevent exacerbations) -Not ideally controlled -Current treatment  . NONE . Medications previously tried: Anoro, Albuterol -Gold Grade: Gold 2 (FEV1 50-79%) -Current COPD Classification:  A (low sx, <2 exacerbations/yr) -MMRC/CAT score: CAT ASSESSMENT  Rank each of the following items on a scale of 0 to 5 (with 5 being most severe) Write a # 0-5 in each box  I never cough (0) > I cough all the time (5) 2  I have no phlegm (mucus) in my chest (0) > My chest is completely full of phlegm (mucus) (5) 0  My chest does not feel tight at all (0) > My chest feels very tight (5) 1  When I walk up a hill or one flight of stairs I am not breathless (0) > When I walk up a hill or one flight of stairs I am very breathless (5) 3  I am not limited doing any activities at home (0) > I am very limited doing  activities at home (5) 0  I am confident leaving my home despite my lung function (0) > I am not at all confident leaving my home because of my lung condition (5)  {0  I sleep soundly (0) > I don't sleep soundly because of my lung condition (5) 3  I have lots of energy (0) > I have no energy at all (5) 3   Total CAT Score: 12 -Pulmonary function testing: 03/04/21 spirometry -Exacerbations requiring treatment in last 6 months: None -Patient denies consistent use of maintenance inhaler -Frequency of rescue inhaler use: NA -Counseled on Benefits  of consistent maintenance inhaler use -Counseled on diet and exercise extensively Recommended patient consider inhaler and follow up with pulmonology. Last visit 2019 declined PFTs.  -Recommend sleep study and CPAP. Patient interested at this time.  GAD 7 : Generalized Anxiety Score 03/10/2020 10/03/2017  Nervous, Anxious, on Edge 0 3  Control/stop worrying 0 2  Worry too much - different things 0 2  Trouble relaxing 0 2  Restless 0 1  Easily annoyed or irritable 0 1  Afraid - awful might happen 0 0  Total GAD 7 Score 0 11  Anxiety Difficulty - Somewhat difficult   PHQ9 SCORE ONLY 03/04/2021 03/10/2020 12/17/2019  PHQ-9 Total Score 0 0 0    Insomnia/?anxiety (Goal: reduced symptoms) -Not ideally controlled -Current treatment  . Seroquel 200 mg qhs -Medications previously tried: trazodone, clonazepam -Counseled on sleep hygeine and CBT for insomnia Recommended changing to amitriptyline which could help with neuropathic leg pain. Recommended discontinue Seroquel is not indicated for insomnia and is associated with metabolic syndrome. Will perform GAD7 next visit    Patient Goals/Self-Care Activities . Patient will:  - take medications as prescribed focus on medication adherence by using pill box check glucose 5 times weekly, document, and provide at future appointments check blood pressure 3 times weekly, document, and provide at future  appointments collaborate with provider on medication access solutions target a minimum of 150 minutes of moderate intensity exercise weekly  Follow Up Plan: Telephone follow up appointment with care management team member scheduled for:         Medication Assistance: Application for Trulicity, Farxiga  medication assistance program. in process.  Anticipated assistance start date 2-4 weeks from receipt of paperwork from patient.  See plan of care for additional detail.  Compliance/Adherence/Medication fill history: Care Gaps: None noted  Star-Rating Drugs: Telmisartan (MICARDIS) 40 MG tablet Last filled: 01/01/2021 90DS MetFORMIN (GLUCOPHAGE) 1000 MG tablet Last filled: 12/05/2020 90 DS Dulaglutide (TRULICITY) 1.28 NO/6.7EH SOPN Last filled: 03/09/2021 28 DS Atorvastatin (LIPITOR) 40 MG tablet Last filled: 01/25/2021 90 DS Dapagliflozin propanediol (FARXIGA) 10 MG TABS tablet Last filled: 03/09/2021 30 DS  Patient's preferred pharmacy is:  [facility] [address] [place] [address] Phone: [phone] Fax: [phone]  [facility]  ([place], [address], [address] [place], [place] [address] Phone: [phone] Fax: [phone]  Uses pill box? Yes Pt endorses 95% compliance  We discussed: Current pharmacy is preferred with insurance plan and patient is satisfied with pharmacy services Patient decided to: Continue current medication management strategy  Care Plan and Follow Up Patient Decision:  Patient agrees to Care Plan and Follow-up.  Plan: Telephone follow up appointment with care management team member scheduled for:  2 - 4 weeks CPA, 2 months PharmD  Junita Push. Kenton Kingfisher PharmD, [facility] [phone]

## 2021-04-13 NOTE — Progress Notes (Deleted)
New Outpatient Visit Date: 04/15/2021  Referring Provider: Jon Billings, NP [address] [place],  [place] [address]  Chief Complaint: ***  HPI:  Juan Le is a 75 y.o. male who is being seen today for the evaluation of *** at the request of Jon Billings, NP. He has a history of ***. ***  --------------------------------------------------------------------------------------------------  Cardiovascular History & Procedures: Cardiovascular Problems: ***  Risk Factors: ***  Cath/PCI: ***  CV Surgery: ***  EP Procedures and Devices: ***  Non-Invasive Evaluation(s): ***  Recent CV Pertinent Labs: Lab Results  Component Value Date   CHOL 135 03/04/2021   HDL 28 (L) 03/04/2021   LDLCALC 69 03/04/2021   LDLCALC 68 11/29/2017   TRIG 230 (H) 03/04/2021   CHOLHDL 4.8 03/04/2021   CHOLHDL 3.9 11/29/2017   BNP 36.4 03/24/2017   K 4.0 03/04/2021   MG 1.6 03/04/2021   BUN 14 03/04/2021   CREATININE 1.01 03/04/2021   CREATININE 1.03 11/29/2017    --------------------------------------------------------------------------------------------------  Past Medical History:  Diagnosis Date   Anxiety    Atelectasis of left lung 03/25/2017   Chronic, continuous use of opioids    COPD (chronic obstructive pulmonary disease) (HCC)    Degenerative disc disease, lumbar    Diabetes (HCC)    DJD (degenerative joint disease)    GERD (gastroesophageal reflux disease)    Heart attack ([place])    History of diverticulitis    Hypertension    Hypertension    Hypertriglyceridemia    Hypokalemia    Insomnia    Mitral regurgitation    Sleep apnea    Vitamin D deficiency    Wears dentures    full upper    Past Surgical History:  Procedure Laterality Date   COLONOSCOPY WITH PROPOFOL N/A 01/28/2017   Procedure: COLONOSCOPY WITH PROPOFOL;  Surgeon: [name], MD;  Location: [facility];  Service: Endoscopy;  Laterality: N/A;  diabetic - oral meds   CORONARY ARTERY  BYPASS GRAFT  1997   CORONARY STENT PLACEMENT  11/2006   FOOT SURGERY  2003   HERNIA REPAIR     POLYPECTOMY N/A 01/28/2017   Procedure: POLYPECTOMY;  Surgeon: [name], MD;  Location: [facility];  Service: Endoscopy;  Laterality: N/A;   UMBILICAL HERNIA REPAIR  04/2012    No outpatient medications have been marked as taking for the 04/15/21 encounter (Appointment) with Acey [place], Juan Gave, MD.    Allergies: Ace inhibitors and Diphenhydramine  Social History   Tobacco Use   Smoking status: Former    Pack years: 0.00    Types: Cigarettes   Smokeless tobacco: Never   Tobacco comments:    former light smoker; quit in his teens  Vaping Use   Vaping Use: Never used  Substance Use Topics   Alcohol use: No    Alcohol/week: 0.0 standard drinks   Drug use: No    Family History  Problem Relation Age of Onset   Throat cancer Father    Colon cancer Father    Heart disease Father    Cancer Father    Depression Father    Hypertension Father    Parkinson's disease Father    Arthritis Brother    Stroke Mother    Kidney disease Mother    Heart disease Mother    Kidney failure Mother    Breast cancer Paternal Aunt    Depression Sister    Stroke Maternal Grandmother    Bone cancer Brother    Stroke Brother  Prostate cancer Neg Hx    Kidney cancer Neg Hx    Bladder Cancer Neg Hx     Review of Systems: A 12-system review of systems was performed and was negative except as noted in the HPI.  --------------------------------------------------------------------------------------------------  Physical Exam: There were no vitals taken for this visit.  General:  *** HEENT: No conjunctival pallor or scleral icterus. Facemask in place. Neck: Supple without lymphadenopathy, thyromegaly, JVD, or HJR. No carotid bruit. Lungs: Normal work of breathing. Clear to auscultation bilaterally without wheezes or crackles. Heart: Regular rate and rhythm without murmurs, rubs, or  gallops. Non-displaced PMI. Abd: Bowel sounds present. Soft, NT/ND without hepatosplenomegaly Ext: No lower extremity edema. Radial, PT, and DP pulses are 2+ bilaterally Skin: Warm and dry without rash. Neuro: CNIII-XII intact. Strength and fine-touch sensation intact in upper and lower extremities bilaterally. Psych: Normal mood and affect.  EKG:  ***  Lab Results  Component Value Date   WBC 7.1 03/04/2021   HGB 14.0 03/04/2021   HCT 41.1 03/04/2021   MCV 93 03/04/2021   PLT 142 (L) 03/04/2021    Lab Results  Component Value Date   NA 137 03/04/2021   K 4.0 03/04/2021   CL 96 03/04/2021   CO2 18 (L) 03/04/2021   BUN 14 03/04/2021   CREATININE 1.01 03/04/2021   GLUCOSE 199 (H) 03/04/2021   ALT 44 11/29/2019    Lab Results  Component Value Date   CHOL 135 03/04/2021   HDL 28 (L) 03/04/2021   LDLCALC 69 03/04/2021   TRIG 230 (H) 03/04/2021   CHOLHDL 4.8 03/04/2021     --------------------------------------------------------------------------------------------------  ASSESSMENT AND PLAN: Juan Gave Tanique Matney, MD 04/13/2021 1:21 PM

## 2021-04-15 ENCOUNTER — Ambulatory Visit: Payer: Medicare Other | Admitting: Internal Medicine

## 2021-05-22 ENCOUNTER — Ambulatory Visit: Payer: Medicare Other | Admitting: Cardiology

## 2021-05-28 NOTE — Progress Notes (Signed)
c  BP 118/66   Pulse 62   Temp 97.9 F (36.6 C)   Ht 5' 10.12" (1.781 m)   Wt 219 lb 4 oz (99.5 kg)   SpO2 98%   BMI 31.35 kg/m    Subjective:    Patient ID: Juan Le., male    DOB: 12-31-1945, 75 y.o.   MRN: [id number]  HPI: Juan Le. is a 75 y.o. male  Chief Complaint  Patient presents with   Diabetes   Hypertension   Hyperlipidemia   HYPERTENSION / [place] Satisfied with current treatment? yes Duration of hypertension: years BP monitoring frequency: not checking BP range:  BP medication side effects: no Past BP meds:  telmisartan and clonidine Duration of hyperlipidemia: years Cholesterol medication side effects: no Cholesterol supplements: none Past cholesterol medications: atorvastain (lipitor) Medication compliance: excellent compliance Aspirin: yes Recent stressors: no Recurrent headaches: no Visual changes: no Palpitations: no Dyspnea: with excretion Chest pain: no Lower extremity edema: no Dizzy/lightheaded: no  DIABETES Now taking [place] and Trulicity. Hypoglycemic episodes:no Polydipsia/polyuria: no Visual disturbance: no Chest pain: no Paresthesias: yes Glucose Monitoring: yes  Accucheck frequency: 170  Fasting glucose:  Post prandial:  Evening:  Before meals: Taking Insulin?: no  Long acting insulin:  Short acting insulin: Blood Pressure Monitoring: not checking Retinal Examination: Up to Date Foot Exam: Up to Date Diabetic Education: Not Completed Pneumovax: Up to Date Influenza: Up to Date Aspirin: yes  COPD COPD status: controlled- feels like it has improved Satisfied with current treatment?: yes Oxygen use: no Dyspnea frequency: when he is doing something physical Cough frequency:  Rescue inhaler frequency:  none Limitation of activity: yes Productive cough:  Last Spirometry: 03/04/2021 Pneumovax: Up to Date Influenza: Up to Date  SLEEP APNEA Sleep apnea status: uncontrolled Duration:  chronic Satisfied with current treatment?:  no CPAP use:  no Sleep quality with CPAP use: good Treament compliance:poor compliance Last sleep study:  Treatments attempted: CPAP Wakes feeling refreshed:  yes Daytime hypersomnolence:  no Fatigue:  no Insomnia:  no Good sleep hygiene:  no Difficulty falling asleep:  no Difficulty staying asleep:  no Snoring bothers bed partner:   unknown Observed apnea by bed partner: no Obesity:  yes Hypertension: yes  Pulmonary hypertension:  no Coronary artery disease:  yes  NEUROPATHY Patient started the Vitamin B12 OTC since last visit.  Continues on the Gabapentin.  States the B12 and gabapentin are helping his pain.   Relevant past medical, surgical, family and social history reviewed and updated as indicated. Interim medical history since our last visit reviewed. Allergies and medications reviewed and updated.  Review of Systems  Eyes:  Negative for visual disturbance.  Respiratory:  Positive for shortness of breath. Negative for chest tightness.   Cardiovascular:  Negative for chest pain, palpitations and leg swelling.  Endocrine: Negative for polydipsia and polyuria.  Neurological:  Positive for numbness. Negative for dizziness, light-headedness and headaches.   Per HPI unless specifically indicated above     Objective:    BP 118/66   Pulse 62   Temp 97.9 F (36.6 C)   Ht 5' 10.12" (1.781 m)   Wt 219 lb 4 oz (99.5 kg)   SpO2 98%   BMI 31.35 kg/m   Wt Readings from Last 3 Encounters:  05/29/21 219 lb 4 oz (99.5 kg)  03/04/21 231 lb 6 oz (105 kg)  12/05/20 230 lb 12.8 oz (104.7 kg)    Physical Exam Vitals and nursing note reviewed.  Constitutional:      General: He is not in acute distress.    Appearance: Normal appearance. He is obese. He is not ill-appearing, toxic-appearing or diaphoretic.  HENT:     Head: Normocephalic.     Right Ear: External ear normal.     Left Ear: External ear normal.     Nose: Nose normal.  No congestion or rhinorrhea.     Mouth/Throat:     Mouth: Mucous membranes are moist.  Eyes:     General:        Right eye: No discharge.        Left eye: No discharge.     Extraocular Movements: Extraocular movements intact.     Conjunctiva/sclera: Conjunctivae normal.     Pupils: Pupils are equal, round, and reactive to light.  Cardiovascular:     Rate and Rhythm: Normal rate and regular rhythm.     Heart sounds: No murmur heard. Pulmonary:     Effort: Pulmonary effort is normal. No respiratory distress.     Breath sounds: Normal breath sounds. No wheezing, rhonchi or rales.  Abdominal:     General: Abdomen is flat. Bowel sounds are normal.  Musculoskeletal:     Cervical back: Normal range of motion and neck supple.  Skin:    General: Skin is warm and dry.     Capillary Refill: Capillary refill takes less than 2 seconds.  Neurological:     General: No focal deficit present.     Mental Status: He is alert and oriented to person, place, and time.  Psychiatric:        Mood and Affect: Mood normal.        Behavior: Behavior normal.        Thought Content: Thought content normal.        Judgment: Judgment normal.    Results for orders placed or performed in visit on 03/04/21  CBC w/Diff  Result Value Ref Range   WBC 7.1 3.4 - 10.8 x10E3/uL   RBC 4.41 4.14 - 5.80 x10E6/uL   Hemoglobin 14.0 13.0 - 17.7 g/dL   Hematocrit 41.1 37.5 - 51.0 %   MCV 93 79 - 97 fL   MCH 31.7 26.6 - 33.0 pg   MCHC 34.1 31.5 - 35.7 g/dL   RDW 13.5 11.6 - 15.4 %   Platelets 142 (L) 150 - 450 x10E3/uL   Neutrophils 54 Not Estab. %   Lymphs 35 Not Estab. %   Monocytes 8 Not Estab. %   Eos 2 Not Estab. %   Basos 0 Not Estab. %   Neutrophils Absolute 3.8 1.4 - 7.0 x10E3/uL   Lymphocytes Absolute 2.5 0.7 - 3.1 x10E3/uL   Monocytes Absolute 0.6 0.1 - 0.9 x10E3/uL   EOS (ABSOLUTE) 0.1 0.0 - 0.4 x10E3/uL   Basophils Absolute 0.0 0.0 - 0.2 x10E3/uL   Immature Granulocytes 1 Not Estab. %   Immature  Grans (Abs) 0.1 0.0 - 0.1 x10E3/uL  HgB A1c  Result Value Ref Range   Hgb A1c MFr Bld 8.4 (H) 4.8 - 5.6 %   Est. average glucose Bld gHb Est-mCnc 194 mg/dL  Lipid panel  Result Value Ref Range   Cholesterol, Total 135 100 - 199 mg/dL   Triglycerides 230 (H) 0 - 149 mg/dL   HDL 28 (L) >39 mg/dL   VLDL Cholesterol Cal 38 5 - 40 mg/dL   LDL Chol Calc (NIH) 69 0 - 99 mg/dL   Chol/HDL Ratio 4.8 0.0 - 5.0  ratio  Basic metabolic panel  Result Value Ref Range   Glucose 199 (H) 65 - 99 mg/dL   BUN 14 8 - 27 mg/dL   Creatinine, Ser 1.01 0.76 - 1.27 mg/dL   eGFR 78 >59 mL/min/1.73   BUN/Creatinine Ratio 14 10 - 24   Sodium 137 134 - 144 mmol/L   Potassium 4.0 3.5 - 5.2 mmol/L   Chloride 96 96 - 106 mmol/L   CO2 18 (L) 20 - 29 mmol/L   Calcium 9.3 8.6 - 10.2 mg/dL  Vitamin B12  Result Value Ref Range   Vitamin B-12 582 232 - 1,245 pg/mL  Urinalysis, Routine w reflex microscopic  Result Value Ref Range   Specific Gravity, UA >1.030 (H) 1.005 - 1.030   pH, UA 5.5 5.0 - 7.5   Color, UA Yellow Yellow   Appearance Ur Clear Clear   Leukocytes,UA Negative Negative   Protein,UA Negative Negative/Trace   Glucose, UA 1+ (A) Negative   Ketones, UA Trace (A) Negative   RBC, UA Negative Negative   Bilirubin, UA Negative Negative   Urobilinogen, Ur 0.2 0.2 - 1.0 mg/dL   Nitrite, UA Negative Negative  Magnesium  Result Value Ref Range   Magnesium 1.6 1.6 - 2.3 mg/dL      Assessment & Plan:   Problem List Items Addressed This Visit       Cardiovascular and Mediastinum   Hypertension associated with diabetes ([place]) - Primary    Chronic.  Controlled.  Continue with current medication regimen.  Labs ordered today.  Return to clinic in 6 months for reevaluation.  Call sooner if concerns arise.        Relevant Orders   Comp Met (CMET)   Aortic atherosclerosis ([place])    Continue with ASA.  Will check labs at next visit.       Relevant Orders   Lipid Profile     Respiratory   COPD with  chronic bronchitis and emphysema (HCC)    Chronic, stable without inhalers.  History of smoking.  Patient declines inhalers at this time. Recommend he utilize CPAP at home.  Return to pulmonary as needed based on symptoms.  Will reassess at future visits.         Endocrine   Type 2 diabetes mellitus with peripheral neuropathy (HCC)    Chronic.  Controlled.  Continue with current medication regimen.  Blood sugars are running in the 170s with Farxiga $RemoveBefo'10mg'YwuuKfleYxl$  and Trulicity .$RemoveBefore'75mg'nXocRHmsCoMUo$ .  Will Increase Trulicity of F6E is not at goal.  Labs ordered today.  Return to clinic in 6 months for reevaluation.  Call sooner if concerns arise.        Relevant Orders   HgB A1c   Hyperlipidemia associated with type 2 diabetes mellitus (HCC)    Chronic. Controlled.  Continue with current medication regimen of Atorvastatin $RemoveBeforeD'40mg'TwzUbbGYJCxkbM$  daily.      Relevant Orders   Lipid Profile   Other Visit Diagnoses     B12 deficiency       Labs ordered today. Will make recommendations based on lab results.         Follow up plan: Return in about 3 months (around 08/29/2021) for HTN, HLD, DM2 FU.

## 2021-05-29 ENCOUNTER — Ambulatory Visit (INDEPENDENT_AMBULATORY_CARE_PROVIDER_SITE_OTHER): Payer: Medicare Other | Admitting: Nurse Practitioner

## 2021-05-29 ENCOUNTER — Encounter: Payer: Self-pay | Admitting: Nurse Practitioner

## 2021-05-29 ENCOUNTER — Other Ambulatory Visit: Payer: Self-pay

## 2021-05-29 VITALS — BP 118/66 | HR 62 | Temp 97.9°F | Ht 70.12 in | Wt 219.2 lb

## 2021-05-29 DIAGNOSIS — J449 Chronic obstructive pulmonary disease, unspecified: Secondary | ICD-10-CM | POA: Diagnosis not present

## 2021-05-29 DIAGNOSIS — I700 Atherosclerosis of aorta: Secondary | ICD-10-CM

## 2021-05-29 DIAGNOSIS — I152 Hypertension secondary to endocrine disorders: Secondary | ICD-10-CM | POA: Diagnosis not present

## 2021-05-29 DIAGNOSIS — E1169 Type 2 diabetes mellitus with other specified complication: Secondary | ICD-10-CM

## 2021-05-29 DIAGNOSIS — E785 Hyperlipidemia, unspecified: Secondary | ICD-10-CM | POA: Diagnosis not present

## 2021-05-29 DIAGNOSIS — E1142 Type 2 diabetes mellitus with diabetic polyneuropathy: Secondary | ICD-10-CM

## 2021-05-29 DIAGNOSIS — E538 Deficiency of other specified B group vitamins: Secondary | ICD-10-CM | POA: Diagnosis not present

## 2021-05-29 DIAGNOSIS — E1159 Type 2 diabetes mellitus with other circulatory complications: Secondary | ICD-10-CM

## 2021-05-29 NOTE — Assessment & Plan Note (Signed)
Chronic.  Controlled.  Continue with current medication regimen.  Blood sugars are running in the 170s with Farxiga '10mg'$  and Trulicity .'75mg'$ .  Will Increase Trulicity of [id number] is not at goal.  Labs ordered today.  Return to clinic in 6 months for reevaluation.  Call sooner if concerns arise.

## 2021-05-29 NOTE — Assessment & Plan Note (Signed)
Chronic.  Controlled.  Continue with current medication regimen.  Labs ordered today.  Return to clinic in 6 months for reevaluation.  Call sooner if concerns arise.  ? ?

## 2021-05-29 NOTE — Assessment & Plan Note (Signed)
Continue with ASA.  Will check labs at next visit.

## 2021-05-29 NOTE — Assessment & Plan Note (Signed)
Chronic. Controlled.  Continue with current medication regimen of Atorvastatin '40mg'$  daily.

## 2021-05-29 NOTE — Assessment & Plan Note (Signed)
Chronic, stable without inhalers.  History of smoking.  Patient declines inhalers at this time. Recommend he utilize CPAP at home.  Return to pulmonary as needed based on symptoms.  Will reassess at future visits.

## 2021-05-30 LAB — LIPID PANEL
Chol/HDL Ratio: 4.6 ratio (ref 0.0–5.0)
Cholesterol, Total: 138 mg/dL (ref 100–199)
HDL: 30 mg/dL — ABNORMAL LOW (ref >39)
LDL Chol Calc (NIH): 71 mg/dL (ref 0–99)
Triglycerides: 223 mg/dL — ABNORMAL HIGH (ref 0–149)
VLDL Cholesterol Cal: 37 mg/dL (ref 5–40)

## 2021-05-30 LAB — COMPREHENSIVE METABOLIC PANEL
ALT: 82 IU/L — ABNORMAL HIGH (ref 0–44)
AST: 47 IU/L — ABNORMAL HIGH (ref 0–40)
Albumin/Globulin Ratio: 1.7 (ref 1.2–2.2)
Albumin: 4.6 g/dL (ref 3.7–4.7)
Alkaline Phosphatase: 68 IU/L (ref 44–121)
BUN/Creatinine Ratio: 13 (ref 10–24)
BUN: 14 mg/dL (ref 8–27)
Bilirubin Total: 0.4 mg/dL (ref 0.0–1.2)
CO2: 17 mmol/L — ABNORMAL LOW (ref 20–29)
Calcium: 9.6 mg/dL (ref 8.6–10.2)
Chloride: 98 mmol/L (ref 96–106)
Creatinine, Ser: 1.07 mg/dL (ref 0.76–1.27)
Globulin, Total: 2.7 g/dL (ref 1.5–4.5)
Glucose: 111 mg/dL — ABNORMAL HIGH (ref 65–99)
Potassium: 4.2 mmol/L (ref 3.5–5.2)
Sodium: 135 mmol/L (ref 134–144)
Total Protein: 7.3 g/dL (ref 6.0–8.5)
eGFR: 72 mL/min/{1.73_m2} (ref >59)

## 2021-05-30 LAB — HEMOGLOBIN A1C
Est. average glucose Bld gHb Est-mCnc: 131 mg/dL
Hgb A1c MFr Bld: 6.2 % — ABNORMAL HIGH (ref 4.8–5.6)

## 2021-06-09 ENCOUNTER — Telehealth: Payer: Self-pay

## 2021-06-10 ENCOUNTER — Telehealth: Payer: Self-pay

## 2021-06-29 ENCOUNTER — Telehealth: Payer: Self-pay

## 2021-06-29 NOTE — Telephone Encounter (Signed)
  Chronic Care Management  Outreach Note   Name: Juan Le. MRN: [id number] DOB: 06-Sep-1946  Referred by: Jon Billings, NP Reason for referral: Telephone Appointment with Clinical Pharmacist, Madelin Rear.   An unsuccessful telephone outreach was attempted today. The patient was referred to the pharmacist for assistance with care management and care coordination.   Telephone appointment with clinical pharmacist today (06/29/2021) at 345pm. If patient immediately returns call, transfer to [phone]. Otherwise, please provide this number so patient can reschedule visit.   Madelin Rear, PharmD, CPP Clinical Pharmacist Practitioner  [phone]

## 2021-07-08 ENCOUNTER — Other Ambulatory Visit: Payer: Self-pay | Admitting: Nurse Practitioner

## 2021-07-08 MED ORDER — DAPAGLIFLOZIN PROPANEDIOL 10 MG PO TABS: 10.0000 mg | ORAL_TABLET | Freq: Every day | ORAL | 0 refills | Status: DC

## 2021-07-08 NOTE — Telephone Encounter (Signed)
Medication Refill - Medication:  dapagliflozin propanediol (FARXIGA) 10 MG TABS tablet  Has the patient contacted their pharmacy? No. (Agent: If no, request that the patient contact the pharmacy for the refill.) (Agent: If yes, when and what did the pharmacy advise?)  Preferred Pharmacy (with phone number or street name):  [facility]  ([place]) [place], [place] Phone:  [phone]  Fax:  [phone]     Has the patient been seen for an appointment in the last year OR does the patient have an upcoming appointment? Yes  Agent: Please be advised that RX refills may take up to 3 business days. We ask that you follow-up with your pharmacy.

## 2021-07-08 NOTE — Telephone Encounter (Signed)
Change in pharmacy remainder of Rx sent to mail order pharmacy

## 2021-07-15 ENCOUNTER — Ambulatory Visit (INDEPENDENT_AMBULATORY_CARE_PROVIDER_SITE_OTHER): Payer: Medicare Other

## 2021-07-15 DIAGNOSIS — Z0000 Encounter for general adult medical examination without abnormal findings: Secondary | ICD-10-CM | POA: Diagnosis not present

## 2021-07-15 NOTE — Patient Instructions (Signed)
Health Maintenance, Male Adopting a healthy lifestyle and getting preventive care are important in promoting health and wellness. Ask your health care provider about: The right schedule for you to have regular tests and exams. Things you can do on your own to prevent diseases and keep yourself healthy. What should I know about diet, weight, and exercise? Eat a healthy diet  Eat a diet that includes plenty of vegetables, fruits, low-fat dairy products, and lean protein. Do not eat a lot of foods that are high in solid fats, added sugars, or sodium. Maintain a healthy weight Body mass index (BMI) is a measurement that can be used to identify possible weight problems. It estimates body fat based on height and weight. Your health care provider can help determine your BMI and help you achieve or maintain a healthy weight. Get regular exercise Get regular exercise. This is one of the most important things you can do for your health. Most adults should: Exercise for at least 150 minutes each week. The exercise should increase your heart rate and make you sweat (moderate-intensity exercise). Do strengthening exercises at least twice a week. This is in addition to the moderate-intensity exercise. Spend less time sitting. Even light physical activity can be beneficial. Watch cholesterol and blood lipids Have your blood tested for lipids and cholesterol at 75 years of age, then have this test every 5 years. You may need to have your cholesterol levels checked more often if: Your lipid or cholesterol levels are high. You are older than 75 years of age. You are at high risk for heart disease. What should I know about cancer screening? Many types of cancers can be detected early and may often be prevented. Depending on your health history and family history, you may need to have cancer screening at various ages. This may include screening for: Colorectal cancer. Prostate cancer. Skin cancer. Lung  cancer. What should I know about heart disease, diabetes, and high blood pressure? Blood pressure and heart disease High blood pressure causes heart disease and increases the risk of stroke. This is more likely to develop in people who have high blood pressure readings, are of African descent, or are overweight. Talk with your health care provider about your target blood pressure readings. Have your blood pressure checked: Every 3-5 years if you are 18-39 years of age. Every year if you are 40 years old or older. If you are between the ages of 65 and 75 and are a current or former smoker, ask your health care provider if you should have a one-time screening for abdominal aortic aneurysm (AAA). Diabetes Have regular diabetes screenings. This checks your fasting blood sugar level. Have the screening done: Once every three years after age 45 if you are at a normal weight and have a low risk for diabetes. More often and at a younger age if you are overweight or have a high risk for diabetes. What should I know about preventing infection? Hepatitis B If you have a higher risk for hepatitis B, you should be screened for this virus. Talk with your health care provider to find out if you are at risk for hepatitis B infection. Hepatitis C Blood testing is recommended for: Everyone born from 1945 through 1965. Anyone with known risk factors for hepatitis C. Sexually transmitted infections (STIs) You should be screened each year for STIs, including gonorrhea and chlamydia, if: You are sexually active and are younger than 75 years of age. You are older than 75 years   of age and your health care provider tells you that you are at risk for this type of infection. Your sexual activity has changed since you were last screened, and you are at increased risk for chlamydia or gonorrhea. Ask your health care provider if you are at risk. Ask your health care provider about whether you are at high risk for HIV.  Your health care provider may recommend a prescription medicine to help prevent HIV infection. If you choose to take medicine to prevent HIV, you should first get tested for HIV. You should then be tested every 3 months for as long as you are taking the medicine. Follow these instructions at home: Lifestyle Do not use any products that contain nicotine or tobacco, such as cigarettes, e-cigarettes, and chewing tobacco. If you need help quitting, ask your health care provider. Do not use street drugs. Do not share needles. Ask your health care provider for help if you need support or information about quitting drugs. Alcohol use Do not drink alcohol if your health care provider tells you not to drink. If you drink alcohol: Limit how much you have to 0-2 drinks a day. Be aware of how much alcohol is in your drink. In the [place] one drink equals one 12 oz bottle of beer (355 mL), one 5 oz glass of wine (148 mL), or one 1 oz glass of hard liquor (44 mL). General instructions Schedule regular health, dental, and eye exams. Stay current with your vaccines. Tell your health care provider if: You often feel depressed. You have ever been abused or do not feel safe at home. Summary Adopting a healthy lifestyle and getting preventive care are important in promoting health and wellness. Follow your health care provider's instructions about healthy diet, exercising, and getting tested or screened for diseases. Follow your health care provider's instructions on monitoring your cholesterol and blood pressure. This information is not intended to replace advice given to you by your health care provider. Make sure you discuss any questions you have with your health care provider. Document Revised: 12/12/2020 Document Reviewed: 09/27/2018 Elsevier Patient Education  2022 [employer]  

## 2021-07-15 NOTE — Progress Notes (Signed)
Subjective:   Juan Gist. is a 75 y.o. male who presents for Medicare Annual/Subsequent preventive examination.  I connected with  Juan Gist. on 07/15/21 by an audio only telemedicine application and verified that I am speaking with the correct person using two identifiers.   I discussed the limitations, risks, security and privacy concerns of performing an evaluation and management service by telephone and the availability of in person appointments. I also discussed with the patient that there may be a patient responsible charge related to this service. The patient expressed understanding and verbally consented to this telephonic visit.  Location of Patient: home Location of Provider: office  List any persons and their role that are participating in the visit with the patient.     Review of Systems    Defer to PCP.  Cardiac Risk Factors include: male gender;diabetes mellitus;advanced age (>7mn, >>34women)     Objective:    There were no vitals filed for this visit. There is no height or weight on file to calculate BMI.  Advanced Directives 07/15/2021 12/17/2019 12/13/2018 01/24/2018 08/11/2017 06/16/2017 05/20/2017  Does Patient Have a Medical Advance Directive? _0  Yes Yes  Type of AParamedicof APensacolaLiving will HLeaf RiverLiving will HBurchinalLiving will Living will;Healthcare Power of ACanon CityLiving will HPoplarLiving will HPanoraLiving will  Does patient want to make changes to medical advance directive? No - Patient declined - - - - - -  Copy of HStonewallin Chart? No - copy requested No - copy requested No - copy requested No - copy requested - - -  Would patient like information on creating a medical advance directive? - - - - - - -    Current Medications (verified) Outpatient Encounter  Medications as of 07/15/2021  Medication Sig   aspirin 81 MG tablet Take 1 tablet by mouth daily.   atorvastatin (LIPITOR) 40 MG tablet TAKE 1 TABLET BY MOUTH  DAILY AT 6 PM   Blood Glucose Monitoring Suppl (ONETOUCH VERIO FLEX SYSTEM) w/Device KIT USE UP TO 4 TIMES DAILY AS  DIRECTED   cloNIDine (CATAPRES) 0.1 MG tablet Take 1 tablet (0.1 mg total) by mouth 2 (two) times daily.   dapagliflozin propanediol (FARXIGA) 10 MG TABS tablet Take 1 tablet (10 mg total) by mouth daily before breakfast.   Dulaglutide (TRULICITY) 09.45MOP/9.2TWSOPN Inject 0.75 mg into the skin once a week.   gabapentin (NEURONTIN) 400 MG capsule TAKE 2 CAPSULES BY MOUTH 3  TIMES DAILY   Lancets (ONETOUCH DELICA PLUS [id number] MISC USE AS DIRECTED TO CHECK  BLOOD GLUCOSE ONCE DAILY   metFORMIN (GLUCOPHAGE) 1000 MG tablet Take 1 tablet (1,000 mg total) by mouth 2 (two) times daily with a meal.   omeprazole (PRILOSEC) 40 MG capsule Take 1 capsule (40 mg total) by mouth daily.   ONETOUCH VERIO test strip USE AS INSTRUCTED  TO TEST  BLOOD SUGAR TWO TIMES DAILY   QUEtiapine (SEROQUEL) 200 MG tablet Take 1 tablet (200 mg total) by mouth at bedtime.   telmisartan (MICARDIS) 40 MG tablet Take 1 tablet (40 mg total) by mouth daily.   No facility-administered encounter medications on file as of 07/15/2021.    Allergies (verified) Ace inhibitors and Diphenhydramine   History: Past Medical History:  Diagnosis Date   Anxiety    Atelectasis of left lung 03/25/2017  Chronic, continuous use of opioids    COPD (chronic obstructive pulmonary disease) (HCC)    Degenerative disc disease, lumbar    Diabetes (HCC)    DJD (degenerative joint disease)    GERD (gastroesophageal reflux disease)    Heart attack ([facility])    History of diverticulitis    Hypertension    Hypertension    Hypertriglyceridemia    Hypokalemia    Insomnia    Mitral regurgitation    Sleep apnea    Vitamin D deficiency    Wears dentures    full upper   Past  Surgical History:  Procedure Laterality Date   COLONOSCOPY WITH PROPOFOL N/A 01/28/2017   Procedure: COLONOSCOPY WITH PROPOFOL;  Surgeon: [name], MD;  Location: [facility];  Service: Endoscopy;  Laterality: N/A;  diabetic - oral meds   CORONARY ARTERY BYPASS GRAFT  1997   CORONARY STENT PLACEMENT  11/2006   FOOT SURGERY  2003   HERNIA REPAIR     POLYPECTOMY N/A 01/28/2017   Procedure: POLYPECTOMY;  Surgeon: [name], MD;  Location: [facility];  Service: Endoscopy;  Laterality: N/A;   UMBILICAL HERNIA REPAIR  04/2012   Family History  Problem Relation Age of Onset   Throat cancer Father    Colon cancer Father    Heart disease Father    Cancer Father    Depression Father    Hypertension Father    Parkinson's disease Father    Arthritis Brother    Stroke Mother    Kidney disease Mother    Heart disease Mother    Kidney failure Mother    Breast cancer Paternal Aunt    Depression Sister    Stroke Maternal Grandmother    Bone cancer Brother    Stroke Brother    Prostate cancer Neg Hx    Kidney cancer Neg Hx    Bladder Cancer Neg Hx    Social History   Socioeconomic History   Marital status: Married    Spouse name: Not on file   Number of children: 2   Years of education: 12   Highest education level: High school graduate  Occupational History   Occupation: Retired    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)  Tobacco Use   Smoking status: Former    Types: Cigarettes   Smokeless tobacco: Never   Tobacco comments:    former light smoker; quit in his teens  Vaping Use   Vaping Use: Never used  Substance and Sexual Activity   Alcohol use: No    Alcohol/week: 0.0 standard drinks   Drug use: No   Sexual activity: Yes    Partners: Female  Other Topics Concern   Not on file  Social History Narrative   Not on file   Social Determinants of Health   Financial Resource Strain: Low Risk    Difficulty of Paying Living Expenses: Not  hard at all  Food Insecurity: No Food Insecurity   Worried About Charity fundraiser in the Last Year: Never true   Arboriculturist in the Last Year: Never true  Transportation Needs: No Transportation Needs   Lack of Transportation (Medical): No   Lack of Transportation (Non-Medical): No  Physical Activity: Inactive   Days of Exercise per Week: 0 days   Minutes of Exercise per Session: 0 min  Stress: No Stress Concern Present   Feeling of Stress : Not at all  Social Connections: Moderately Isolated  Frequency of Communication with Friends and Family: Twice a week   Frequency of Social Gatherings with Friends and Family: Once a week   Attends Religious Services: Never   Marine scientist or Organizations: No   Attends Music therapist: Never   Marital Status: Married    Tobacco Counseling Counseling given: Not Answered Tobacco comments: former light smoker; quit in his teens   Clinical Intake:  Pre-visit preparation completed: Yes  Pain : No/denies pain     Nutritional Risks: None Diabetes: Yes  How often do you need to have someone help you when you read instructions, pamphlets, or other written materials from your doctor or pharmacy?: 1 - Never What is the last grade level you completed in school?: 11th grade  Diabetic?Yes  Interpreter Needed?: No      Activities of Daily Living In your present state of health, do you have any difficulty performing the following activities: 07/15/2021 03/04/2021  Hearing? N N  Vision? N N  Difficulty concentrating or making decisions? N N  Walking or climbing stairs? N N  Dressing or bathing? N N  Doing errands, shopping? N N  Preparing Food and eating ? N -  Using the Toilet? N -  In the past six months, have you accidently leaked urine? N -  Do you have problems with loss of bowel control? N -  Managing your Medications? N -  Managing your Finances? N -  Housekeeping or managing your Housekeeping? N -   Some recent data might be hidden    Patient Care Team: Jon Billings, NP as PCP - General Anell Barr, OD as Consulting Physician (Optometry) Ernestine Conrad Gordan Payment as Physician Assistant (Urology) Laverle Hobby, MD as Consulting Physician (Pulmonary Disease) Cathi Roan, [facility] (Inactive) (Pharmacist) Vladimir Faster, [facility] (Pharmacist)  Indicate any recent Medical Services you may have received from other than [facility] providers in the past year (date may be approximate).     Assessment:   This is a routine wellness examination for [employer].  Hearing/Vision screen No results found.  Dietary issues and exercise activities discussed: Current Exercise Habits: The patient does not participate in regular exercise at present, Exercise limited by: None identified   Goals Addressed   None   Depression Screen PHQ 2/9 Scores 07/15/2021 03/04/2021 03/10/2020 12/17/2019 12/13/2018 01/24/2018 10/03/2017  PHQ - 2 Score 0 0 0 0 0 0 4  PHQ- 9 Score - - 0 - - - 13    Fall Risk Fall Risk  07/15/2021 03/04/2021 03/10/2020 12/17/2019 12/13/2018  Falls in the past year? 0 0 0 0 0  Number falls in past yr: 0 0 0 0 -  Injury with Fall? 0 0 0 0 -  Risk for fall due to : No Fall Risks No Fall Risks - - -  Follow up Falls evaluation completed Falls evaluation completed - - -    FALL RISK PREVENTION PERTAINING TO THE HOME:  Any stairs in or around the home? Yes  If so, are there any without handrails? No  Home free of loose throw rugs in walkways, pet beds, electrical cords, etc? Yes  Adequate lighting in your home to reduce risk of falls? Yes   ASSISTIVE DEVICES UTILIZED TO PREVENT FALLS:  Life alert? No  Use of a cane, walker or w/c? No  Grab bars in the bathroom? Yes  Shower chair or bench in shower? No  Elevated toilet seat or a handicapped toilet? Yes  TIMED UP AND GO:  Was the test performed? N/A.  Length of time to ambulate 10 feet: N/A sec.     Cognitive Function:      6CIT Screen 07/15/2021 12/13/2018 01/24/2018 01/17/2017  What Year? 0 points 0 points 0 points 0 points  What month? 0 points 0 points 0 points 0 points  What time? 0 points 0 points 0 points 0 points  Count back from 20 0 points 0 points 0 points 0 points  Months in reverse 0 points 0 points 0 points 4 points  Repeat phrase 0 points 0 points 0 points 2 points  Total Score 0 0 0 6    Immunizations Immunization History  Administered Date(s) Administered   Fluad Quad(high Dose 65+) 06/11/2019   Influenza, High Dose Seasonal PF 07/17/2015, 07/29/2016, 07/15/2017   Influenza, Seasonal, Injecte, Preservative Fre 08/14/2008   Influenza,inj,Quad PF,6+ Mos 07/20/2013, 06/20/2014   Influenza,inj,quad, With Preservative 08/18/2018   Influenza-Unspecified 08/18/2018, 08/07/2020   Moderna Sars-Covid-2 Vaccination 11/28/2019, 12/26/2019, 09/17/2020   Pneumococcal Conjugate-13 08/15/2014   Pneumococcal Polysaccharide-23 05/11/2012   Tdap 05/29/2014    TDAP status: Up to date  Flu Vaccine status: Due, Education has been provided regarding the importance of this vaccine. Advised may receive this vaccine at local pharmacy or Health Dept. Aware to provide a copy of the vaccination record if obtained from local pharmacy or Health Dept. Verbalized acceptance and understanding.  Pneumococcal vaccine status: Up to date  Covid-19 vaccine status: Completed vaccines  Qualifies for Shingles Vaccine? Yes   Zostavax completed No   Shingrix Completed?: No.    Education has been provided regarding the importance of this vaccine. Patient has been advised to call insurance company to determine out of pocket expense if they have not yet received this vaccine. Advised may also receive vaccine at local pharmacy or Health Dept. Verbalized acceptance and understanding.  Screening Tests Health Maintenance  Topic Date Due   Zoster Vaccines- Shingrix (1 of 2) Never done   COVID-19 Vaccine (4 - Booster for Moderna  series) 01/16/2021   INFLUENZA VACCINE  05/18/2021   FOOT EXAM  06/11/2021   HEMOGLOBIN A1C  11/29/2021   COLONOSCOPY (Pts 45-85yr Insurance coverage will need to be confirmed)  01/28/2022   OPHTHALMOLOGY EXAM  02/04/2022   TETANUS/TDAP  05/29/2024   Hepatitis C Screening  Completed   HPV VACCINES  Aged Out    Health Maintenance  Health Maintenance Due  Topic Date Due   Zoster Vaccines- Shingrix (1 of 2) Never done   COVID-19 Vaccine (4 - Booster for Moderna series) 01/16/2021   INFLUENZA VACCINE  05/18/2021   FOOT EXAM  06/11/2021    Colorectal cancer screening: Type of screening: Colonoscopy. Completed 01/28/17. Repeat every 5 years  Lung Cancer Screening: (Low Dose CT Chest recommended if Age 438-80years, 30 pack-year currently smoking OR have quit w/in 15years.) does not qualify.   Lung Cancer Screening Referral: N/A  Additional Screening:  Hepatitis C Screening: does qualify; Completed 01/17/17  Vision Screening: Recommended annual ophthalmology exams for early detection of glaucoma and other disorders of the eye. Is the patient up to date with their annual eye exam?  Yes  Who is the provider or what is the name of the office in which the patient attends annual eye exams? [facility] If pt is not established with a provider, would they like to be referred to a provider to establish care? No .   Dental Screening: Recommended annual dental exams for proper  oral hygiene  Community Resource Referral / Chronic Care Management: CRR required this visit?  No   CCM required this visit?  No      Plan:     I have personally reviewed and noted the following in the patient's chart:   Medical and social history Use of alcohol, tobacco or illicit drugs  Current medications and supplements including opioid prescriptions. Patient is not currently taking opioid prescriptions. Functional ability and status Nutritional status Physical activity Advanced directives List of other  physicians Hospitalizations, surgeries, and ER visits in previous 12 months Vitals Screenings to include cognitive, depression, and falls Referrals and appointments  In addition, I have reviewed and discussed with patient certain preventive protocols, quality metrics, and best practice recommendations. A written personalized care plan for preventive services as well as general preventive health recommendations were provided to patient.   Juan Le , Thank you for taking time to come for your Medicare Wellness Visit. I appreciate your ongoing commitment to your health goals. Please review the following plan we discussed and let me know if I can assist you in the future.   These are the goals we discussed:  Goals       DIET - DECREASE SODA OR JUICE INTAKE      Recommend to cut back on soda intake to 1 glass a day and substitute with water.       DIET - INCREASE WATER INTAKE      Recommend drinking at least 6-8 glasses of water a day       Exercise 3x per week (30 min per time)      Recommend to exercise for 3 days a week for at least 30 minutes at a time.       Medication Adherence (pt-stated)       Current Barriers:  Sometimes forgets to take medications  Pharmacist Clinical Goal(s): Over the next 90 days, Juan Le will be adherent to all medications as evidenced by pharmacy fill history.   Interventions: Patient educated on purpose, proper use and potential adverse effects of telmisartan and clonidine Counseled on the importance of taking clonidine and telmisartan daily, especially clonidine due to rebound high blood pressure Education on signs and symptoms of high blood pressure, stroke (FAST)  Patient Self Care Activities:  Take all medication as prescribed  Initial goal documentation       PharmD Improve My Heart Health-Coronary Artery Disease      Timeframe:  Short-Term Goal Priority:  High Start Date:                             Expected End Date:                        Follow Up Date 2 month follow up    - be open to making changes - I can manage, know and watch for signs of a heart attack - if I have chest pain, call for help - learn about small changes that will make a big difference    Why is this important?   Lifestyle changes are key to improving the blood flow to your heart. Think about the things you can change and set a goal to live healthy.  Remember, when the blood vessels to your heart start to get clogged you may not have any symptoms.  Over time, they can get worse.  Don't ignore the signs,  like chest pain, and get help right away.     Notes:       PharmD Monitor and Manage My Blood Sugar-Diabetes Type 2      Timeframe:  Short-Term Goal Priority:  High Start Date:                             Expected End Date:                       Follow Up Date 2 month follow up    - check blood sugar at prescribed times - check blood sugar if I feel it is too high or too low - enter blood sugar readings and medication or insulin into daily log - take the blood sugar log to all doctor visits - take the blood sugar meter to all doctor visits    Why is this important?   Checking your blood sugar at home helps to keep it from getting very high or very low.  Writing the results in a diary or log helps the doctor know how to care for you.  Your blood sugar log should have the time, date and the results.  Also, write down the amount of insulin or other medicine that you take.  Other information, like what you ate, exercise done and how you were feeling, will also be helpful.     Notes:         This is a list of the screening recommended for you and due dates:  Health Maintenance  Topic Date Due   Zoster (Shingles) Vaccine (1 of 2) Never done   COVID-19 Vaccine (4 - Booster for Moderna series) 01/16/2021   Flu Shot  05/18/2021   Complete foot exam   06/11/2021   Hemoglobin A1C  11/29/2021   Colon Cancer Screening  01/28/2022    Eye exam for diabetics  02/04/2022   Tetanus Vaccine  05/29/2024   Hepatitis C Screening: USPSTF Recommendation to screen - Ages 18-79 yo.  Completed   HPV Vaccine  Aged 75 [address] [place], [place]   07/15/2021   Nurse Notes: Non Face to Face 50 Minutes

## 2021-07-29 ENCOUNTER — Telehealth: Payer: Self-pay | Admitting: Nurse Practitioner

## 2021-07-29 NOTE — Telephone Encounter (Signed)
Pts wife, Butch Penny, called and is requesting to have pts [place] refilled. She states that the pt receives it through the company and not at a pharmacy. She states that the prescription was sent to [facility], but they cannot receive it there due to the $200 co pay. Please advise.

## 2021-08-03 ENCOUNTER — Telehealth: Payer: Self-pay | Admitting: Nurse Practitioner

## 2021-08-03 MED ORDER — QUETIAPINE FUMARATE 200 MG PO TABS: 200.0000 mg | ORAL_TABLET | Freq: Every day | ORAL | 1 refills | Status: DC

## 2021-08-03 NOTE — Telephone Encounter (Signed)
Medication sent to the pharmacy.

## 2021-08-03 NOTE — Telephone Encounter (Signed)
Medication Refill - Medication:  QUEtiapine (SEROQUEL) 200 MG tablet   Has the patient contacted their pharmacy? Yes.   Contact PCP  Preferred Pharmacy (with phone number or street name):  [facility]  ([place] [place], [place] [facility]  [address] [place] [address], [place] [address]  Phone:  [phone]  Fax:  [phone]   Has the patient been seen for an appointment in the last year OR does the patient have an upcoming appointment? Yes.    Agent: Please be advised that RX refills may take up to 3 business days. We ask that you follow-up with your pharmacy.

## 2021-08-03 NOTE — Telephone Encounter (Signed)
Called patient to let him know RX was sent in. Patient states he received his medication from [facility] today. Requested RX sent to [facility] today be cancelled.    Called and cancelled RX at [facility] per patient request.

## 2021-08-03 NOTE — Telephone Encounter (Signed)
Pt called saying the [place]ade was supposed to be shipped to his home because he can not afford it through [employer].  CB#  [phone]

## 2021-08-05 ENCOUNTER — Telehealth: Payer: Self-pay

## 2021-08-05 NOTE — Chronic Care Management (AMB) (Signed)
Chronic Care Management Pharmacy Assistant   Name: [name]  MRN: [id number] DOB: 1946-09-30  Reason for Encounter: Disease State Diabetes     Recent office visits:  05/29/21 Larae Grooms NP - Seen for hypertension - Labs ordered - Increase Trulicity (unknown dosage)  -  Follow up in 3 months   Recent consult visits:  None noted   Hospital visits:  None in previous 6 months  Medications: Outpatient Encounter Medications as of 08/05/2021  Medication Sig   aspirin 81 MG tablet Take 1 tablet by mouth daily.   atorvastatin (LIPITOR) 40 MG tablet TAKE 1 TABLET BY MOUTH  DAILY AT 6 PM   Blood Glucose Monitoring Suppl (ONETOUCH VERIO FLEX SYSTEM) w/Device KIT USE UP TO 4 TIMES DAILY AS  DIRECTED   cloNIDine (CATAPRES) 0.1 MG tablet Take 1 tablet (0.1 mg total) by mouth 2 (two) times daily.   dapagliflozin propanediol (FARXIGA) 10 MG TABS tablet Take 1 tablet (10 mg total) by mouth daily before breakfast.   Dulaglutide (TRULICITY) 0.75 MG/0.5ML SOPN Inject 0.75 mg into the skin once a week.   gabapentin (NEURONTIN) 400 MG capsule TAKE 2 CAPSULES BY MOUTH 3  TIMES DAILY   Lancets (ONETOUCH DELICA PLUS LANCET33G) MISC USE AS DIRECTED TO CHECK  BLOOD GLUCOSE ONCE DAILY   metFORMIN (GLUCOPHAGE) 1000 MG tablet Take 1 tablet (1,000 mg total) by mouth 2 (two) times daily with a meal.   omeprazole (PRILOSEC) 40 MG capsule Take 1 capsule (40 mg total) by mouth daily.   ONETOUCH VERIO test strip USE AS INSTRUCTED  TO TEST  BLOOD SUGAR TWO TIMES DAILY   QUEtiapine (SEROQUEL) 200 MG tablet Take 1 tablet (200 mg total) by mouth at bedtime.   telmisartan (MICARDIS) 40 MG tablet Take 1 tablet (40 mg total) by mouth daily.   No facility-administered encounter medications on file as of 08/05/2021.   Recent Relevant Labs: Lab Results  Component Value Date/Time   HGBA1C 6.2 (H) 05/29/2021 09:04 AM   HGBA1C 8.4 (H) 03/04/2021 09:21 AM   HGBA1C 6.9 12/05/2020 10:44 AM   HGBA1C 7.0 (H)  09/04/2020 08:37 AM   MICROALBUR 30 (H) 12/05/2020 10:44 AM   MICROALBUR 30 (H) 06/11/2020 02:23 PM    Kidney Function Lab Results  Component Value Date/Time   CREATININE 1.07 05/29/2021 09:04 AM   CREATININE 1.01 03/04/2021 09:21 AM   CREATININE 1.03 11/29/2017 09:36 AM   CREATININE 1.01 04/21/2017 08:52 AM   GFRNONAA 79 12/05/2020 11:03 AM   GFRNONAA 74 04/21/2017 08:52 AM   GFRAA 91 12/05/2020 11:03 AM   GFRAA 86 04/21/2017 08:52 AM    Current antihyperglycemic regimen:  Trulicity 0.75 mg qd Metformin 1000 mg bid Farxiga 10 mg qd Gabapentin 800 mg tid What recent interventions/DTPs have been made to improve glycemic control:  N/a Have there been any recent hospitalizations or ED visits since last visit with CPP? No Patient denies hypoglycemic symptoms, including Pale, Sweaty, Shaky, Hungry, Nervous/irritable, and Vision changes Patient denies hyperglycemic symptoms, including blurry vision, excessive thirst, fatigue, polyuria, and weakness How often are you checking your blood sugar? Infrequently. Patient states that they have not been checking their blood sugar lately.  What are your blood sugars ranging?  N/a During the week, how often does your blood glucose drop below 70? Never Are you checking your feet daily/regularly? Patient states that he check his feet often   Patient is scheduled for an appointment via telephone on 10/05/21 @ 10:30 am  Adherence Review: Is the patient currently on a STATIN medication? Yes Is the patient currently on ACE/ARB medication? Yes Does the patient have >5 day gap between last estimated fill dates? No   Care Gaps: FOOT EXAM Last completed: Jun 11, 2020  Star Rating Drugs: Telmisartan (MICARDIS) 40 MG tablet Last filled: 05/26/2021 90 DS  MetFORMIN (GLUCOPHAGE) 1000 MG tablet Last filled: 05/15/2021 90 DS  Dulaglutide (TRULICITY) 7.34 YZ/7.0DU SOPN Last filled: 03/09/2021 28 DS  Atorvastatin (LIPITOR) 40 MG tablet Last filled:  07/14/2021 90 DS  Dapagliflozin propanediol (FARXIGA) 10 MG TABS tablet Last filled: 07/08/21 90 DS   Misc comment: Spoke with patient and he stated that he has been out of his farxiga for 4 days. I called [employer] and the customer care representative Kennyth Lose stated that when they updated their system, His information was missed so she sent out an urgent fill to the pharmacy which will take approximately 3-5 days to get to the patient. CPP is notified and patient was called and notified of this as well. I also asked patient about his renewal form for trulicity and he states that he would like for the renewal form to be mailed due to not having any laptops or Internet access.   Andee Poles, CMA

## 2021-08-07 NOTE — Telephone Encounter (Signed)
Copied from [place] [id number]. Topic: General - Other >> Aug 07, 2021  9:54 AM Yvette Rack wrote: Reason for CRM: Pt spouse stated they are on the pt assistance plan and they have not heard from anyone in regards to pt getting the dapagliflozin propanediol (FARXIGA) 10 MG TABS tablet. Pt stated the person that usually handles it name is Edison Nasuti but they have not heard from him and pt has been without his medication for 3 days now. Pt spouse requests pt call be returned

## 2021-08-14 ENCOUNTER — Telehealth: Payer: Self-pay

## 2021-08-14 NOTE — Chronic Care Management (AMB) (Signed)
    Chronic Care Management Pharmacy Assistant   Name: Juan Le.  MRN: [id number] DOB: Apr 24, 1946  Reason for Encounter: Patient assistance form renewal    Mailed patient's patient assistance form for Trulicity 5248 renewal today. Patient has already been notified of this earlier this week to fill out highlighted portions, attach proof of income, and return to PCP. Patient requested a paper renewal.  Andee Poles, CMA

## 2021-08-30 NOTE — Progress Notes (Signed)
c  BP 126/70   Pulse 66   Temp (!) 97.3 F (36.3 C) (Oral)   Ht 5' 9.9" (1.775 m)   Wt 218 lb 9.6 oz (99.2 kg)   SpO2 96%   BMI 31.46 kg/m    Subjective:    Patient ID: [name], male    DOB: 1945-12-20, 75 y.o.   MRN: [id number]  HPI: Erikson Danzy. is a 75 y.o. male  Chief Complaint  Patient presents with   Diabetes   Hyperlipidemia   Hypertension   HYPERTENSION / HYPERLIPIDEMIA Satisfied with current treatment? yes Duration of hypertension: years BP monitoring frequency: not checking BP range:  BP medication side effects: no Past BP meds:  telmisartan and clonidine Duration of hyperlipidemia: years Cholesterol medication side effects: no Cholesterol supplements: none Past cholesterol medications: atorvastain (lipitor) Medication compliance: excellent compliance Aspirin: yes Recent stressors: no Recurrent headaches: no Visual changes: no Palpitations: no Dyspnea: with excretion Chest pain: no Lower extremity edema: no Dizzy/lightheaded: no  DIABETES Now taking [place] and Trulicity. Hypoglycemic episodes:no Polydipsia/polyuria: no Visual disturbance: no Chest pain: no Paresthesias: yes Glucose Monitoring: yes  Accucheck frequency: 170  Fasting glucose:  Post prandial:  Evening:  Before meals: Taking Insulin?: no  Long acting insulin:  Short acting insulin: Blood Pressure Monitoring: not checking Retinal Examination: Up to Date Foot Exam: Up to Date- black toenail Diabetic Education: Not Completed Pneumovax: Up to Date Influenza: Up to Date Aspirin: yes  COPD COPD status: controlled- feels like it has improved Satisfied with current treatment?: yes Oxygen use: no Dyspnea frequency: when he is doing something physical Cough frequency:  Rescue inhaler frequency:  none Limitation of activity: yes Productive cough:  Last Spirometry: 03/04/2021 Pneumovax: Up to Date Influenza: Up to Date  SLEEP APNEA Sleep apnea status:  uncontrolled Duration: chronic Satisfied with current treatment?:  no CPAP use:  no Sleep quality with CPAP use: good Treament compliance:poor compliance Last sleep study:  Treatments attempted: CPAP Wakes feeling refreshed:  yes Daytime hypersomnolence:  no Fatigue:  no Insomnia:  no Good sleep hygiene:  no Difficulty falling asleep:  no Difficulty staying asleep:  no Snoring bothers bed partner:   unknown Observed apnea by bed partner: no Obesity:  yes Hypertension: yes  Pulmonary hypertension:  no Coronary artery disease:  yes  NEUROPATHY Patient started the Vitamin B12 OTC since last visit.  Continues on the Gabapentin.  States the B12 and gabapentin are helping his pain. States it is about the same as before.  Continues with the same medication regimen.    Relevant past medical, surgical, family and social history reviewed and updated as indicated. Interim medical history since our last visit reviewed. Allergies and medications reviewed and updated.  Review of Systems  Eyes:  Negative for visual disturbance.  Respiratory:  Positive for shortness of breath. Negative for chest tightness.   Cardiovascular:  Negative for chest pain, palpitations and leg swelling.  Endocrine: Negative for polydipsia and polyuria.  Musculoskeletal:        Black toenail  Neurological:  Positive for numbness. Negative for dizziness, light-headedness and headaches.   Per HPI unless specifically indicated above     Objective:    BP 126/70   Pulse 66   Temp (!) 97.3 F (36.3 C) (Oral)   Ht 5' 9.9" (1.775 m)   Wt 218 lb 9.6 oz (99.2 kg)   SpO2 96%   BMI 31.46 kg/m   Wt Readings from Last 3 Encounters:  08/31/21 218 lb 9.6 oz (99.2 kg)  05/29/21 219 lb 4 oz (99.5 kg)  03/04/21 231 lb 6 oz (105 kg)    Physical Exam Vitals and nursing note reviewed.  Constitutional:      General: He is not in acute distress.    Appearance: Normal appearance. He is obese. He is not ill-appearing,  toxic-appearing or diaphoretic.  HENT:     Head: Normocephalic.     Right Ear: External ear normal.     Left Ear: External ear normal.     Nose: Nose normal. No congestion or rhinorrhea.     Mouth/Throat:     Mouth: Mucous membranes are moist.  Eyes:     General:        Right eye: No discharge.        Left eye: No discharge.     Extraocular Movements: Extraocular movements intact.     Conjunctiva/sclera: Conjunctivae normal.     Pupils: Pupils are equal, round, and reactive to light.  Cardiovascular:     Rate and Rhythm: Normal rate and regular rhythm.     Heart sounds: No murmur heard. Pulmonary:     Effort: Pulmonary effort is normal. No respiratory distress.     Breath sounds: Normal breath sounds. No wheezing, rhonchi or rales.  Abdominal:     General: Abdomen is flat. Bowel sounds are normal.  Musculoskeletal:     Cervical back: Normal range of motion and neck supple.  Feet:     Right foot:     Protective Sensation: 10 sites tested.  8 sites sensed.     Skin integrity: Callus and dry skin present. No skin breakdown, erythema or warmth.     Toenail Condition: Right toenails are abnormally thick and long. Fungal disease present.    Left foot:     Protective Sensation: 10 sites tested.  8 sites sensed.     Skin integrity: Callus and dry skin present. No skin breakdown, erythema or warmth.     Toenail Condition: Left toenails are abnormally thick and long.  Skin:    General: Skin is warm and dry.     Capillary Refill: Capillary refill takes less than 2 seconds.  Neurological:     General: No focal deficit present.     Mental Status: He is alert and oriented to person, place, and time.  Psychiatric:        Mood and Affect: Mood normal.        Behavior: Behavior normal.        Thought Content: Thought content normal.        Judgment: Judgment normal.    Results for orders placed or performed in visit on 05/29/21  Comp Met (CMET)  Result Value Ref Range   Glucose 111  (H) 65 - 99 mg/dL   BUN 14 8 - 27 mg/dL   Creatinine, Ser 8.40 0.76 - 1.27 mg/dL   eGFR 72 >37 VO/HKG/6.77   BUN/Creatinine Ratio 13 10 - 24   Sodium 135 134 - 144 mmol/L   Potassium 4.2 3.5 - 5.2 mmol/L   Chloride 98 96 - 106 mmol/L   CO2 17 (L) 20 - 29 mmol/L   Calcium 9.6 8.6 - 10.2 mg/dL   Total Protein 7.3 6.0 - 8.5 g/dL   Albumin 4.6 3.7 - 4.7 g/dL   Globulin, Total 2.7 1.5 - 4.5 g/dL   Albumin/Globulin Ratio 1.7 1.2 - 2.2   Bilirubin Total 0.4 0.0 - 1.2 mg/dL  Alkaline Phosphatase 68 44 - 121 IU/L   AST 47 (H) 0 - 40 IU/L   ALT 82 (H) 0 - 44 IU/L  HgB A1c  Result Value Ref Range   Hgb A1c MFr Bld 6.2 (H) 4.8 - 5.6 %   Est. average glucose Bld gHb Est-mCnc 131 mg/dL  Lipid Profile  Result Value Ref Range   Cholesterol, Total 138 100 - 199 mg/dL   Triglycerides 223 (H) 0 - 149 mg/dL   HDL 30 (L) >39 mg/dL   VLDL Cholesterol Cal 37 5 - 40 mg/dL   LDL Chol Calc (NIH) 71 0 - 99 mg/dL   Chol/HDL Ratio 4.6 0.0 - 5.0 ratio      Assessment & Plan:   Problem List Items Addressed This Visit       Cardiovascular and Mediastinum   Hypertension associated with diabetes ([place]) - Primary    Chronic.  Controlled.  Continue with current medication regimen on telimisartan $RemoveBeforeD'40mg'dZPaYysxXekQmV$ .  Labs ordered today.  Return to clinic in 3 months for reevaluation.  Call sooner if concerns arise.        Relevant Orders   Comp Met (CMET)   Aortic atherosclerosis (HCC)     Respiratory   COPD with chronic bronchitis and emphysema (HCC)    Chronic.  Controlled.  Continue with current medication regimen.  Feels like breathing is about the same.  Labs ordered today.  Return to clinic in 3 months for reevaluation.  Call sooner if concerns arise.          Endocrine   Type 2 diabetes mellitus with peripheral neuropathy (HCC)    Chronic.  Controlled.  Continue with current medication regimen.  Blood sugars remain the same around 170s with Farxiga $RemoveBefo'10mg'FEwiqYGktDp$  and Trulicity .$RemoveBefore'75mg'EYpmGrWnWvnqm$ .  Will Increase Trulicity of  P4D is not at goal.  Labs ordered today.  Return to clinic in 3 months for reevaluation.  Call sooner if concerns arise.        Relevant Orders   HgB A1c   Hyperlipidemia associated with type 2 diabetes mellitus (HCC)    Chronic. Controlled.  Continue with current medication regimen of Atorvastatin $RemoveBeforeD'40mg'wJOFdrpbawwtaE$  daily.  Labs ordered today.  Follow up in 3 months.      Relevant Orders   Lipid Profile   Other Visit Diagnoses     Deformity of toenail       Referral placed for patient to see podiatry due to black right great toe. May need toenail removed. Toenails also need to be trimmed.   Relevant Orders   Ambulatory referral to Podiatry        Follow up plan: No follow-ups on file.

## 2021-08-31 ENCOUNTER — Other Ambulatory Visit: Payer: Self-pay

## 2021-08-31 ENCOUNTER — Encounter: Payer: Self-pay | Admitting: Nurse Practitioner

## 2021-08-31 ENCOUNTER — Ambulatory Visit (INDEPENDENT_AMBULATORY_CARE_PROVIDER_SITE_OTHER): Payer: Medicare Other | Admitting: Nurse Practitioner

## 2021-08-31 VITALS — BP 126/70 | HR 66 | Temp 97.3°F | Ht 69.9 in | Wt 218.6 lb

## 2021-08-31 DIAGNOSIS — I152 Hypertension secondary to endocrine disorders: Secondary | ICD-10-CM | POA: Diagnosis not present

## 2021-08-31 DIAGNOSIS — E785 Hyperlipidemia, unspecified: Secondary | ICD-10-CM

## 2021-08-31 DIAGNOSIS — E1159 Type 2 diabetes mellitus with other circulatory complications: Secondary | ICD-10-CM | POA: Diagnosis not present

## 2021-08-31 DIAGNOSIS — J449 Chronic obstructive pulmonary disease, unspecified: Secondary | ICD-10-CM

## 2021-08-31 DIAGNOSIS — L608 Other nail disorders: Secondary | ICD-10-CM | POA: Diagnosis not present

## 2021-08-31 DIAGNOSIS — E1142 Type 2 diabetes mellitus with diabetic polyneuropathy: Secondary | ICD-10-CM | POA: Diagnosis not present

## 2021-08-31 DIAGNOSIS — E1169 Type 2 diabetes mellitus with other specified complication: Secondary | ICD-10-CM

## 2021-08-31 DIAGNOSIS — I700 Atherosclerosis of aorta: Secondary | ICD-10-CM | POA: Diagnosis not present

## 2021-08-31 NOTE — Assessment & Plan Note (Signed)
Chronic.  Controlled.  Continue with current medication regimen.  Feels like breathing is about the same.  Labs ordered today.  Return to clinic in 3 months for reevaluation.  Call sooner if concerns arise.

## 2021-08-31 NOTE — Assessment & Plan Note (Signed)
Chronic. Controlled.  Continue with current medication regimen of Atorvastatin 40mg  daily.  Labs ordered today.  Follow up in 3 months.

## 2021-08-31 NOTE — Assessment & Plan Note (Signed)
Chronic.  Controlled.  Continue with current medication regimen on telimisartan 40mg .  Labs ordered today.  Return to clinic in 3 months for reevaluation.  Call sooner if concerns arise.

## 2021-08-31 NOTE — Assessment & Plan Note (Addendum)
Chronic.  Controlled.  Continue with current medication regimen.  Blood sugars remain the same around 170s with Farxiga 10mg  and Trulicity .75mg .  Will Increase Trulicity of B1Q is not at goal.  Labs ordered today.  Return to clinic in 3 months for reevaluation.  Call sooner if concerns arise.

## 2021-09-01 LAB — COMPREHENSIVE METABOLIC PANEL
ALT: 52 IU/L — ABNORMAL HIGH (ref 0–44)
AST: 36 IU/L (ref 0–40)
Albumin/Globulin Ratio: 1.9 (ref 1.2–2.2)
Albumin: 4.6 g/dL (ref 3.7–4.7)
Alkaline Phosphatase: 64 IU/L (ref 44–121)
BUN/Creatinine Ratio: 16 (ref 10–24)
BUN: 14 mg/dL (ref 8–27)
Bilirubin Total: 0.3 mg/dL (ref 0.0–1.2)
CO2: 19 mmol/L — ABNORMAL LOW (ref 20–29)
Calcium: 9.2 mg/dL (ref 8.6–10.2)
Chloride: 102 mmol/L (ref 96–106)
Creatinine, Ser: 0.9 mg/dL (ref 0.76–1.27)
Globulin, Total: 2.4 g/dL (ref 1.5–4.5)
Glucose: 121 mg/dL — ABNORMAL HIGH (ref 70–99)
Potassium: 3.7 mmol/L (ref 3.5–5.2)
Sodium: 141 mmol/L (ref 134–144)
Total Protein: 7 g/dL (ref 6.0–8.5)
eGFR: 89 mL/min/{1.73_m2} (ref >59)

## 2021-09-01 LAB — LIPID PANEL
Chol/HDL Ratio: 4.6 ratio (ref 0.0–5.0)
Cholesterol, Total: 128 mg/dL (ref 100–199)
HDL: 28 mg/dL — ABNORMAL LOW
LDL Chol Calc (NIH): 70 mg/dL (ref 0–99)
Triglycerides: 177 mg/dL — ABNORMAL HIGH (ref 0–149)
VLDL Cholesterol Cal: 30 mg/dL (ref 5–40)

## 2021-09-01 LAB — HEMOGLOBIN A1C
Est. average glucose Bld gHb Est-mCnc: 123 mg/dL
Hgb A1c MFr Bld: 5.9 % — ABNORMAL HIGH (ref 4.8–5.6)

## 2021-09-01 NOTE — Progress Notes (Signed)
Hi Mr. Swaim.  Your lab work looks great.  A1c improved form 6.2 to 5.9.  Keep up the great work.  Your cholesterol also improved some.  Please let me know if you have any questions.  See you at our next visit.

## 2021-09-07 ENCOUNTER — Ambulatory Visit: Payer: Medicare Other | Admitting: Podiatry

## 2021-09-07 ENCOUNTER — Other Ambulatory Visit: Payer: Self-pay

## 2021-09-07 DIAGNOSIS — S90111A Contusion of right great toe without damage to nail, initial encounter: Secondary | ICD-10-CM

## 2021-09-07 NOTE — Progress Notes (Signed)
ROS: Subjective:  Patient ID: Juan Le., male    DOB: 1946/01/12,  MRN: [id number] HPI Chief Complaint  Patient presents with   Nail Problem    Left great toenail turned black a few weeks ago - no injures or pain in nail- mentioned he is having concerns due to being diabetic    75 y.o. male presents with the above complaint.   Denies fever chills nausea vomiting muscle aches pains calf pain back pain chest pain shortness of breath.  Past Medical History:  Diagnosis Date   Anxiety    Atelectasis of left lung 03/25/2017   Chronic, continuous use of opioids    COPD (chronic obstructive pulmonary disease) (HCC)    Degenerative disc disease, lumbar    Diabetes (HCC)    DJD (degenerative joint disease)    GERD (gastroesophageal reflux disease)    Heart attack ([place])    History of diverticulitis    Hypertension    Hypertension    Hypertriglyceridemia    Hypokalemia    Insomnia    Mitral regurgitation    Sleep apnea    Vitamin D deficiency    Wears dentures    full upper   Past Surgical History:  Procedure Laterality Date   COLONOSCOPY WITH PROPOFOL N/A 01/28/2017   Procedure: COLONOSCOPY WITH PROPOFOL;  Surgeon: [name], MD;  Location: [facility];  Service: Endoscopy;  Laterality: N/A;  diabetic - oral meds   CORONARY ARTERY BYPASS GRAFT  1997   CORONARY STENT PLACEMENT  11/2006   FOOT SURGERY  2003   HERNIA REPAIR     POLYPECTOMY N/A 01/28/2017   Procedure: POLYPECTOMY;  Surgeon: [name], MD;  Location: [facility];  Service: Endoscopy;  Laterality: N/A;   UMBILICAL HERNIA REPAIR  04/2012    Current Outpatient Medications:    aspirin 81 MG tablet, Take 1 tablet by mouth daily., Disp: , Rfl:    atorvastatin (LIPITOR) 40 MG tablet, TAKE 1 TABLET BY MOUTH  DAILY AT 6 PM, Disp: 90 tablet, Rfl: 4   Blood Glucose Monitoring Suppl (ONETOUCH VERIO FLEX SYSTEM) w/Device KIT, USE UP TO 4 TIMES DAILY AS  DIRECTED, Disp: 1 kit, Rfl: 0   cloNIDine  (CATAPRES) 0.1 MG tablet, Take 1 tablet (0.1 mg total) by mouth 2 (two) times daily., Disp: 180 tablet, Rfl: 4   dapagliflozin propanediol (FARXIGA) 10 MG TABS tablet, Take 1 tablet (10 mg total) by mouth daily before breakfast., Disp: 90 tablet, Rfl: 0   Dulaglutide (TRULICITY) 5.10 CH/8.5ID SOPN, Inject 0.75 mg into the skin once a week., Disp: 6 mL, Rfl: 1   gabapentin (NEURONTIN) 400 MG capsule, TAKE 2 CAPSULES BY MOUTH 3  TIMES DAILY, Disp: 540 capsule, Rfl: 4   Lancets (ONETOUCH DELICA PLUS [id number]) MISC, USE AS DIRECTED TO CHECK  BLOOD GLUCOSE ONCE DAILY, Disp: 100 each, Rfl: 3   metFORMIN (GLUCOPHAGE) 1000 MG tablet, Take 1 tablet (1,000 mg total) by mouth 2 (two) times daily with a meal., Disp: 180 tablet, Rfl: 4   omeprazole (PRILOSEC) 40 MG capsule, Take 1 capsule (40 mg total) by mouth daily., Disp: 90 capsule, Rfl: 4   ONETOUCH VERIO test strip, USE AS INSTRUCTED  TO TEST  BLOOD SUGAR TWO TIMES DAILY, Disp: 200 strip, Rfl: 3   QUEtiapine (SEROQUEL) 200 MG tablet, Take 1 tablet (200 mg total) by mouth at bedtime., Disp: 90 tablet, Rfl: 1   telmisartan (MICARDIS) 40 MG tablet, Take 1 tablet (40 mg total) by  mouth daily., Disp: 90 tablet, Rfl: 4  Allergies  Allergen Reactions   Ace Inhibitors Cough   Diphenhydramine     stomach upset   Review of Systems Objective:  There were no vitals filed for this visit.  General: Well developed, nourished, in no acute distress, alert and oriented x3   Dermatological: Skin is warm, dry and supple bilateral. Nails x 10 are well maintained; remaining integument appears unremarkable at this time. There are no open sores, no preulcerative lesions, no rash or signs of infection present.  Hallux left demonstrates dark nail plate with contusion and subungual hematoma the nail is not loose.  There is no drainage no erythema associated with it.  No pain on medial lateral compression or direct palpation of the nail or distal phalanx.  Vascular: Dorsalis  Pedis artery and Posterior Tibial artery pedal pulses are 2/4 bilateral with immedate capillary fill time. Pedal hair growth present. No varicosities and no lower extremity edema present bilateral.   Neruologic: Grossly intact via light touch bilateral. Vibratory intact via tuning fork bilateral. Protective threshold with Semmes Wienstein monofilament intact to all pedal sites bilateral. Patellar and Achilles deep tendon reflexes 2+ bilateral. No Babinski or clonus noted bilateral.   Musculoskeletal: No gross boney pedal deformities bilateral. No pain, crepitus, or limitation noted with foot and ankle range of motion bilateral. Muscular strength 5/5 in all groups tested bilateral.  Gait: Unassisted, Nonantalgic.    Radiographs:  None take  Assessment & Plan:   Assessment: Discussed etiology pathology conservative surgical therapies contusion toe  Plan: Follow-up with me as needed I did provide him with instructions on what to watch out for such as infection he will notify me with any changes.     Stepfanie Yott T. [place], [place]

## 2021-09-09 ENCOUNTER — Other Ambulatory Visit: Payer: Self-pay | Admitting: Nurse Practitioner

## 2021-09-09 NOTE — Telephone Encounter (Signed)
Requested Prescriptions  Pending Prescriptions Disp Refills  . FARXIGA 10 MG TABS tablet [Pharmacy Med Name: Farxiga 10 MG Oral Tablet] 90 tablet 1    Sig: TAKE 1 TABLET BY MOUTH  DAILY BEFORE BREAKFAST     Endocrinology:  Diabetes - SGLT2 Inhibitors Passed - 09/09/2021  6:32 AM      Passed - Cr in normal range and within 360 days    Creat  Date Value Ref Range Status  11/29/2017 1.03 0.70 - 1.18 mg/dL Final    Comment:    For patients >75 years of age, the reference limit for Creatinine is approximately 13% higher for people identified as African-American. .    Creatinine, Ser  Date Value Ref Range Status  08/31/2021 0.90 0.76 - 1.27 mg/dL Final   Creatinine, Urine  Date Value Ref Range Status  04/21/2017 206 20 - 370 mg/dL Final         Passed - LDL in normal range and within 360 days    LDL Cholesterol (Calc)  Date Value Ref Range Status  11/29/2017 68 mg/dL (calc) Final    Comment:    Reference range: <100 . Desirable range <100 mg/dL for primary prevention;   <70 mg/dL for patients with CHD or diabetic patients  with > or = 2 CHD risk factors. Marland Kitchen LDL-C is now calculated using the Martin-Hopkins  calculation, which is a validated novel method providing  better accuracy than the Friedewald equation in the  estimation of LDL-C.  Cresenciano Genre et al. Annamaria Helling. 4580;998(33): 2061-2068  (http://education.[employer].com/faq/FAQ164)    LDL Chol Calc (NIH)  Date Value Ref Range Status  08/31/2021 70 0 - 99 mg/dL Final         Passed - HBA1C is between 0 and 7.9 and within 180 days    HB A1C (BAYER DCA - WAIVED)  Date Value Ref Range Status  12/05/2020 6.9 <7.0 % Final    Comment:                                          Diabetic Adult            <7.0                                       Healthy Adult        4.3 - 5.7                                                           (DCCT/NGSP) American Diabetes Association's Summary of Glycemic Recommendations for Adults  with Diabetes: Hemoglobin A1c <7.0%. More stringent glycemic goals (A1c <6.0%) may further reduce complications at the cost of increased risk of hypoglycemia.    Hgb A1c MFr Bld  Date Value Ref Range Status  08/31/2021 5.9 (H) 4.8 - 5.6 % Final    Comment:             Prediabetes: 5.7 - 6.4          Diabetes: >6.4          Glycemic control for adults with diabetes: <7.0  Passed - eGFR in normal range and within 360 days    GFR, Est African American  Date Value Ref Range Status  04/21/2017 86 >=60 mL/min Final   GFR calc Af Amer  Date Value Ref Range Status  12/05/2020 91 >59 mL/min/1.73 Final    Comment:    **In accordance with recommendations from the NKF-ASN Task force,**   [facility] is in the process of updating its eGFR calculation to the   2021 CKD-EPI creatinine equation that estimates kidney function   without a race variable.    GFR, Est Non African American  Date Value Ref Range Status  04/21/2017 74 >=60 mL/min Final   GFR calc non Af Amer  Date Value Ref Range Status  12/05/2020 79 >59 mL/min/1.73 Final   eGFR  Date Value Ref Range Status  08/31/2021 89 >59 mL/min/1.73 Final         Passed - Valid encounter within last 6 months    Recent Outpatient Visits          1 week ago Hypertension associated with diabetes ([place])   [facility] Jon Billings, NP   3 months ago Hypertension associated with diabetes ([place])   [facility] Jon Billings, NP   6 months ago Aortic atherosclerosis ([place])   [facility] Jon Billings, NP   9 months ago Type 2 diabetes mellitus with peripheral neuropathy ([place])   [place], Henrine Screws T, NP   1 year ago Hypertension associated with diabetes [facility])   [place], Jolene T, NP      Future Appointments            In 2 months Jon Billings, NP [facility], [place]

## 2021-09-24 ENCOUNTER — Other Ambulatory Visit: Payer: Self-pay | Admitting: Nurse Practitioner

## 2021-09-24 NOTE — Telephone Encounter (Signed)
Requested medications are due for refill today.  Seems a bit too soon  Requested medications are on the active medications list.  yes  Last refill. 08/03/2021 #90 with 1 refill  Future visit scheduled.   yes  Notes to clinic.  Medication not delegated.    Requested Prescriptions  Pending Prescriptions Disp Refills   QUEtiapine (SEROQUEL) 200 MG tablet [Pharmacy Med Name: QUETIAPINE  200MG   TAB] 90 tablet 3    Sig: TAKE 1 TABLET BY MOUTH AT  BEDTIME     Not Delegated - Psychiatry:  Antipsychotics - Second Generation (Atypical) - quetiapine Failed - 09/24/2021 12:20 AM      Failed - This refill cannot be delegated      Failed - ALT in normal range and within 180 days    ALT  Date Value Ref Range Status  08/31/2021 52 (H) 0 - 44 IU/L Final          Passed - AST in normal range and within 180 days    AST  Date Value Ref Range Status  08/31/2021 36 0 - 40 IU/L Final          Passed - Last BP in normal range    BP Readings from Last 1 Encounters:  08/31/21 126/70          Passed - Valid encounter within last 6 months    Recent Outpatient Visits           3 weeks ago Hypertension associated with diabetes ([place])   [facility] Jon Billings, NP   3 months ago Hypertension associated with diabetes ([place])   [facility] Jon Billings, NP   6 months ago Aortic atherosclerosis ([place])   [facility] Jon Billings, NP   9 months ago Type 2 diabetes mellitus with peripheral neuropathy ([place])   [place], Lusk T, NP   1 year ago Hypertension associated with diabetes [facility])   [place], Jolene T, NP       Future Appointments             In 2 months Jon Billings, NP [facility], [place]

## 2021-10-05 ENCOUNTER — Telehealth: Payer: Self-pay

## 2021-10-05 ENCOUNTER — Telehealth: Payer: Medicare Other

## 2021-10-05 NOTE — Telephone Encounter (Signed)
Pt returning Ardis Hughs call/ phone keeps cutting off / please call pt from different phone if possible

## 2021-10-05 NOTE — Telephone Encounter (Signed)
°  Care Management   Follow Up Note   10/05/2021 Name: Juan Le. MRN: [id number] DOB: [birth year]   Referred by: Jon Billings, NP Reason for referral : No chief complaint on file.   An unsuccessful telephone outreach was attempted today. The patient was referred to the case management team for assistance with care management and care coordination.  and left VM with patient  Follow Up Plan:  Care Guide to be notified and reschedule patient  for pharmcist f/u if willing.   Madelin Rear, PharmD, BCGP Clinical Pharmacist  [phone]

## 2021-10-05 NOTE — Telephone Encounter (Signed)
Pt returned call, requested a call back from [place], please advise.

## 2021-10-06 NOTE — Progress Notes (Signed)
Spoke to patient wife. She states Mr. Geneva missed the clinical pharmacist Berdie Ogren phone call and stated if his appointment could be rescheduled his appointment is rescheduled for 11/23/20 @ 1pm.

## 2021-11-10 ENCOUNTER — Other Ambulatory Visit: Payer: Self-pay | Admitting: Nurse Practitioner

## 2021-11-10 MED ORDER — QUETIAPINE FUMARATE 200 MG PO TABS: 200.0000 mg | ORAL_TABLET | Freq: Every day | ORAL | 1 refills | Status: DC

## 2021-11-10 NOTE — Telephone Encounter (Signed)
Copied from [place] [id number]. Topic: Quick Communication - Rx Refill/Question >> Nov 10, 2021 10:47 AM Leward Quan A wrote: Medication: QUEtiapine (SEROQUEL) 200 MG tablet  Has the patient contacted their pharmacy? Yes.  Need new Rx  (Agent: If no, request that the patient contact the pharmacy for the refill. If patient does not wish to contact the pharmacy document the reason why and proceed with request.) (Agent: If yes, when and what did the pharmacy advise?)  Preferred Pharmacy (with phone number or street name): [facility] [place], [address] [place]  Phone:  [phone] Fax:  [phone]    Has the patient been seen for an appointment in the last year OR does the patient have an upcoming appointment? Yes.    Agent: Please be advised that RX refills may take up to 3 business days. We ask that you follow-up with your pharmacy.

## 2021-11-10 NOTE — Telephone Encounter (Signed)
Requested medication (s) are due for refill today:   Provider to review  Requested medication (s) are on the active medication list:   Yes  Future visit scheduled:   Yes 12/01/2021   Last ordered: 08/03/2021 #90, 1 refill  Returned because it's a non delegated refill   Requested Prescriptions  Pending Prescriptions Disp Refills   QUEtiapine (SEROQUEL) 200 MG tablet 90 tablet 1    Sig: Take 1 tablet (200 mg total) by mouth at bedtime.     Not Delegated - Psychiatry:  Antipsychotics - Second Generation (Atypical) - quetiapine Failed - 11/10/2021  1:10 PM      Failed - This refill cannot be delegated      Failed - ALT in normal range and within 180 days    ALT  Date Value Ref Range Status  08/31/2021 52 (H) 0 - 44 IU/L Final          Passed - AST in normal range and within 180 days    AST  Date Value Ref Range Status  08/31/2021 36 0 - 40 IU/L Final          Passed - Last BP in normal range    BP Readings from Last 1 Encounters:  08/31/21 126/70          Passed - Valid encounter within last 6 months    Recent Outpatient Visits           2 months ago Hypertension associated with diabetes ([place])   [facility] Jon Billings, NP   5 months ago Hypertension associated with diabetes ([place])   [facility] Jon Billings, NP   8 months ago Aortic atherosclerosis ([place])   [facility] Jon Billings, NP   11 months ago Type 2 diabetes mellitus with peripheral neuropathy ([place])   [place], Echo T, NP   1 year ago Hypertension associated with diabetes ([place])   [place], Jolene T, NP       Future Appointments             In 3 weeks Jon Billings, NP [facility], [facility]

## 2021-11-19 ENCOUNTER — Other Ambulatory Visit: Payer: Self-pay | Admitting: Nurse Practitioner

## 2021-11-19 DIAGNOSIS — E1169 Type 2 diabetes mellitus with other specified complication: Secondary | ICD-10-CM

## 2021-11-19 DIAGNOSIS — E1159 Type 2 diabetes mellitus with other circulatory complications: Secondary | ICD-10-CM

## 2021-11-19 DIAGNOSIS — E1142 Type 2 diabetes mellitus with diabetic polyneuropathy: Secondary | ICD-10-CM

## 2021-11-19 NOTE — Telephone Encounter (Signed)
Requested medications are due for refill today.  yes  Requested medications are on the active medications list.  yes  Last refill. All 3 refilled 12/05/2020  Future visit scheduled.   yes  Notes to clinic.  Pharmacy is requesting a 1 year supply.    Requested Prescriptions  Pending Prescriptions Disp Refills   atorvastatin (LIPITOR) 40 MG tablet [Pharmacy Med Name: Atorvastatin Calcium 40 MG Oral Tablet] 90 tablet 3    Sig: TAKE 1 TABLET BY MOUTH  DAILY AT 6PM     Cardiovascular:  Antilipid - Statins Failed - 11/19/2021 10:21 PM      Failed - Lipid Panel in normal range within the last 12 months    Cholesterol, Total  Date Value Ref Range Status  08/31/2021 128 100 - 199 mg/dL Final   LDL Cholesterol (Calc)  Date Value Ref Range Status  11/29/2017 68 mg/dL (calc) Final    Comment:    Reference range: <100 . Desirable range <100 mg/dL for primary prevention;   <70 mg/dL for patients with CHD or diabetic patients  with > or = 2 CHD risk factors. Marland Kitchen LDL-C is now calculated using the Martin-Hopkins  calculation, which is a validated novel method providing  better accuracy than the Friedewald equation in the  estimation of LDL-C.  Cresenciano Genre et al. Annamaria Helling. 2025;427(06): 2061-2068  (http://education.[employer].com/faq/FAQ164)    LDL Chol Calc (NIH)  Date Value Ref Range Status  08/31/2021 70 0 - 99 mg/dL Final   HDL  Date Value Ref Range Status  08/31/2021 28 (L) >39 mg/dL Final   Triglycerides  Date Value Ref Range Status  08/31/2021 177 (H) 0 - 149 mg/dL Final         Passed - Patient is not pregnant      Passed - Valid encounter within last 12 months    Recent Outpatient Visits           2 months ago Hypertension associated with diabetes ([place])   [facility] Jon Billings, NP   5 months ago Hypertension associated with diabetes ([place])   [facility] Jon Billings, NP   8 months ago Aortic atherosclerosis ([place])   [facility] Jon Billings, NP   11 months ago Type 2 diabetes mellitus with peripheral neuropathy ([place])   [place], Henrine Screws T, NP   1 year ago Hypertension associated with diabetes ([place])   [place], Barbaraann Faster, NP       Future Appointments             In 1 week Jon Billings, NP [place], [facility]             gabapentin (NEURONTIN) 400 MG capsule [Pharmacy Med Name: Gabapentin 400 MG Oral Capsule] 540 capsule 3    Sig: TAKE 2 CAPSULES BY MOUTH 3  TIMES DAILY     Neurology: Anticonvulsants - gabapentin Passed - 11/19/2021 10:21 PM      Passed - Cr in normal range and within 360 days    Creat  Date Value Ref Range Status  11/29/2017 1.03 0.70 - 1.18 mg/dL Final    Comment:    For patients >5 years of age, the reference limit for Creatinine is approximately 13% higher for people identified as African-American. .    Creatinine, Ser  Date Value Ref Range Status  08/31/2021 0.90 0.76 - 1.27 mg/dL Final   Creatinine, Urine  Date Value Ref Range Status  04/21/2017 206  20 - 370 mg/dL Final          Passed - Completed PHQ-2 or PHQ-9 in the last 360 days      Passed - Valid encounter within last 12 months    Recent Outpatient Visits           2 months ago Hypertension associated with diabetes ([place])   [facility] Jon Billings, NP   5 months ago Hypertension associated with diabetes ([place])   [facility]ings, NP   8 months ago Aortic atherosclerosis ([place])   [facility] Jon Billings, NP   11 months ago Type 2 diabetes mellitus with peripheral neuropathy ([place])   [place], Henrine Screws T, NP   1 year ago Hypertension associated with diabetes ([place])   [place], Henrine Screws T, NP       Future Appointments             In 1 week Jon Billings, NP [place], [facility]             cloNIDine  (CATAPRES) 0.1 MG tablet [Pharmacy Med Name: cloNIDine HCl 0.1 MG Oral Tablet] 180 tablet 3    Sig: TAKE 1 TABLET BY MOUTH  TWICE DAILY     Cardiovascular:  Alpha-2 Agonists Passed - 11/19/2021 10:21 PM      Passed - Last BP in normal range    BP Readings from Last 1 Encounters:  08/31/21 126/70          Passed - Last Heart Rate in normal range    Pulse Readings from Last 1 Encounters:  08/31/21 66          Passed - Valid encounter within last 6 months    Recent Outpatient Visits           2 months ago Hypertension associated with diabetes ([place])   [facility] Jon Billings, NP   5 months ago Hypertension associated with diabetes ([place])   [facility] Jon Billings, NP   8 months ago Aortic atherosclerosis ([place])   [facility] Jon Billings, NP   11 months ago Type 2 diabetes mellitus with peripheral neuropathy ([place])   [place], [place] T, NP   1 year ago Hypertension associated with diabetes ([place])   [place], Jolene T, NP       Future Appointments             In 1 week Jon Billings, NP [facility], [facility]

## 2021-11-23 ENCOUNTER — Ambulatory Visit (INDEPENDENT_AMBULATORY_CARE_PROVIDER_SITE_OTHER): Payer: Medicare Other

## 2021-11-23 ENCOUNTER — Telehealth: Payer: Self-pay

## 2021-11-23 DIAGNOSIS — E785 Hyperlipidemia, unspecified: Secondary | ICD-10-CM

## 2021-11-23 DIAGNOSIS — E1142 Type 2 diabetes mellitus with diabetic polyneuropathy: Secondary | ICD-10-CM

## 2021-11-23 DIAGNOSIS — E1159 Type 2 diabetes mellitus with other circulatory complications: Secondary | ICD-10-CM

## 2021-11-23 DIAGNOSIS — E1169 Type 2 diabetes mellitus with other specified complication: Secondary | ICD-10-CM

## 2021-11-23 NOTE — Progress Notes (Signed)
I have contacted [employer] about the update on patient assistance application on Trulicity. They had missing information on the application and needed a copy of his insurance card. They requested for it to be faxed and I have faxed it to [phone].  Corrie Mckusick, [place]

## 2021-11-23 NOTE — Patient Instructions (Signed)
Mr. Kinnan,  Thank you for talking with me today. I have included our care plan/goals in the following pages.   Please review and call me at [phone] with any questions.  Thanks! Ellin Mayhew, PharmD Clinical Pharmacist  [phone]  There are no care plans that you recently modified to display for this patient.  The patient verbalized understanding of instructions provided today and agreed to receive a MyChart copy of patient instruction and/or educational materials. Telephone follow up appointment with pharmacy team member scheduled for: See next appointment with "Care Management Staff" under "What's Next" below.

## 2021-11-23 NOTE — Progress Notes (Signed)
Chronic Care Management Pharmacy Note  11/23/2021 Name:  Juan Le. MRN:  [id number] DOB:  02-Oct-1946  Summary: Can consider up titration atorvastatin 40->80 mg or addition of Zetia 10 mg to achieve LDL <55 and 50% reduction in baseline per ADA 2023; next PCP visit 2/14 Unsure if PAP on trulicity was confirmed for 2023. Will have [employer] contacted for approval status.  Subjective: Juan Le. is an 76 y.o. year old male who is a primary patient of Jon Billings, NP.  The CCM team was consulted for assistance with disease management and care coordination needs.    Engaged with patient by telephone for follow up visit in response to provider referral for pharmacy case management and/or care coordination services.   Consent to Services:  The patient was given information about Chronic Care Management services, agreed to services, and gave verbal consent prior to initiation of services.  Please see initial visit note for detailed documentation.   Patient Care Team: Jon Billings, NP as PCP - General Anell Barr, OD as Consulting Physician (Optometry) Ernestine Conrad, [employer] as Physician Assistant (Urology) Laverle Hobby, MD as Consulting Physician (Pulmonary Disease) Cathi Roan, [facility] (Inactive) (Pharmacist) Madelin Rear, [facility] (Pharmacist)  Hospital visits:   Objective:  Lab Results  Component Value Date   CREATININE 0.90 08/31/2021   CREATININE 1.07 05/29/2021   CREATININE 1.01 03/04/2021    Lab Results  Component Value Date   HGBA1C 5.9 (H) 08/31/2021   HGBA1C 6.2 (H) 05/29/2021   HGBA1C 8.4 (H) 03/04/2021   Last diabetic Eye exam:  Lab Results  Component Value Date/Time   HMDIABEYEEXA No Retinopathy 02/04/2021 09:10 AM    Last diabetic Foot exam: No results found for: HMDIABFOOTEX      Component Value Date/Time   CHOL 128 08/31/2021 0905   CHOL 138 05/29/2021 0904   CHOL 135 03/04/2021 0921   TRIG 177 (H) 08/31/2021 0905    TRIG 223 (H) 05/29/2021 0904   TRIG 230 (H) 03/04/2021 0921   HDL 28 (L) 08/31/2021 0905   HDL 30 (L) 05/29/2021 0904   HDL 28 (L) 03/04/2021 0921   CHOLHDL 4.6 08/31/2021 0905   CHOLHDL 3.9 11/29/2017 0936   VLDL 42 (H) 04/21/2017 0852   LDLCALC 70 08/31/2021 0905   LDLCALC 71 05/29/2021 0904   LDLCALC 69 03/04/2021 0921   LDLCALC 68 11/29/2017 0936    Hepatic Function Latest Ref Rng & Units 08/31/2021 05/29/2021 11/29/2019  Total Protein 6.0 - 8.5 g/dL 7.0 7.3 6.7  Albumin 3.7 - 4.7 g/dL 4.6 4.6 4.2  AST 0 - 40 IU/L 36 47(H) 22  ALT 0 - 44 IU/L 52(H) 82(H) 44  Alk Phosphatase 44 - 121 IU/L 64 68 75  Total Bilirubin 0.0 - 1.2 mg/dL 0.3 0.4 0.3  Bilirubin, Direct 0.00 - 0.40 mg/dL - - -    Lab Results  Component Value Date/Time   TSH 2.670 09/04/2020 09:23 AM   TSH 2.60 01/17/2017 10:00 AM    CBC Latest Ref Rng & Units 03/04/2021 09/04/2020 03/24/2017  WBC 3.4 - 10.8 x10E3/uL 7.1 7.5 7.1  Hemoglobin 13.0 - 17.7 g/dL 14.0 14.4 14.5  Hematocrit 37.5 - 51.0 % 41.1 42.9 42.3  Platelets 150 - 450 x10E3/uL 142(L) 160 159    Lab Results  Component Value Date/Time   VD25OH 14 (L) 01/17/2017 10:00 AM   VD25OH 8 (L) 07/29/2016 08:57 AM    Clinical ASCVD:  The ASCVD Risk score (Arnett DK,  et al., 2019) failed to calculate for the following reasons:   The patient has a prior MI or stroke diagnosis   Social History   Tobacco Use  Smoking Status Former   Types: Cigarettes  Smokeless Tobacco Never  Tobacco Comments   former light smoker; quit in his teens   BP Readings from Last 3 Encounters:  08/31/21 126/70  05/29/21 118/66  03/04/21 (!) 142/78   Pulse Readings from Last 3 Encounters:  08/31/21 66  05/29/21 62  03/04/21 82   Wt Readings from Last 3 Encounters:  08/31/21 218 lb 9.6 oz (99.2 kg)  05/29/21 219 lb 4 oz (99.5 kg)  03/04/21 231 lb 6 oz (105 kg)   BMI Readings from Last 3 Encounters:  08/31/21 31.46 kg/m  05/29/21 31.35 kg/m  03/04/21 33.68 kg/m     Assessment: Review of patient past medical history, allergies, medications, health status, including review of consultants reports, laboratory and other test data, was performed as part of comprehensive evaluation and provision of chronic care management services.   SDOH:  (Social Determinants of Health) assessments and interventions performed: Yes   CCM Care Plan  Allergies  Allergen Reactions   Ace Inhibitors Cough   Diphenhydramine     stomach upset    Medications Reviewed Today     Reviewed by Jon Billings, NP (Nurse Practitioner) on 08/31/21 at 0850  Med List Status: <None>   Medication Order Taking? Sig Documenting Provider Last Dose Status Informant  aspirin 81 MG tablet [id number] Yes Take 1 tablet by mouth daily. [provider] Taking Active            Med Note Gillie Manners Jun 12, 2018  8:09 AM)    atorvastatin (LIPITOR) 40 MG tablet [id number] Yes TAKE 1 TABLET BY MOUTH  DAILY AT 6 PM Cannady, Jolene T, NP Taking Active   Blood Glucose Monitoring Suppl (ONETOUCH VERIO FLEX SYSTEM) w/Device KIT [id number] Yes USE UP TO 4 TIMES DAILY AS  DIRECTED Cannady, Jolene T, NP Taking Active   cloNIDine (CATAPRES) 0.1 MG tablet [id number] Yes Take 1 tablet (0.1 mg total) by mouth 2 (two) times daily. Marnee Guarneri T, NP Taking Active   dapagliflozin propanediol (FARXIGA) 10 MG TABS tablet [id number] Yes Take 1 tablet (10 mg total) by mouth daily before breakfast. Jon Billings, NP Taking Active   Dulaglutide (TRULICITY) 3.20 EB/3.4DH SOPN [id number] Yes Inject 0.75 mg into the skin once a week. Jon Billings, NP Taking Active   gabapentin (NEURONTIN) 400 MG capsule [id number] Yes TAKE 2 CAPSULES BY MOUTH 3  TIMES DAILY Venita Lick, NP Taking Active   Lancets [facility] DELICA PLUS [id number]) MISC [id number] Yes USE AS DIRECTED TO CHECK  BLOOD GLUCOSE ONCE DAILY Wynetta Emery, Megan P, DO Taking Active   metFORMIN (GLUCOPHAGE) 1000 MG tablet [id number] Yes  Take 1 tablet (1,000 mg total) by mouth 2 (two) times daily with a meal. Cannady, Jolene T, NP Taking Active   omeprazole (PRILOSEC) 40 MG capsule [id number] Yes Take 1 capsule (40 mg total) by mouth daily. Venita Lick, NP Taking Active   [facility] VERIO test strip [id number] Yes USE AS INSTRUCTED  TO TEST  BLOOD SUGAR TWO TIMES DAILY Bacigalupo, Dionne Bucy, MD Taking Active   QUEtiapine (SEROQUEL) 200 MG tablet [id number] Yes Take 1 tablet (200 mg total) by mouth at bedtime. Jon Billings, NP Taking Active   telmisartan (MICARDIS) 40 MG tablet [id number] Yes Take 1 tablet (40 mg total) by  mouth daily. Venita Lick, NP Taking Active             Patient Active Problem List   Diagnosis Date Noted   Vitamin B12 deficiency anemia 02/04/2021   Aortic atherosclerosis ([place]) 08/31/2020   COPD with chronic bronchitis and emphysema ([place]) 03/08/2019   Obesity 06/12/2018   History of acute myocardial infarction 03/10/2018   Obstructive sleep apnea 03/10/2018   Hx of colonic polyps    Benign neoplasm of ascending colon    Atherosclerosis of coronary artery 01/20/2017   DDD (degenerative disc disease), lumbar 01/20/2017   Cervical spinal stenosis 01/20/2017   MI (mitral incompetence) 01/20/2017   Chronic neck and back pain 08/13/2015   GERD (gastroesophageal reflux disease) 06/12/2015   Hypertension associated with diabetes ([place]) 06/12/2015   Type 2 diabetes mellitus with peripheral neuropathy ([place]) 04/16/2015   Hyperlipidemia associated with type 2 diabetes mellitus ([place]) 04/16/2015   Insomnia 04/16/2015   H/O coronary artery bypass surgery 04/16/2015   DDD (degenerative disc disease), cervical 04/11/2015   H/O adenomatous polyp of colon 06/10/2011    Immunization History  Administered Date(s) Administered   Fluad Quad(high Dose 65+) 06/11/2019, 08/10/2021   Influenza, High Dose Seasonal PF 07/17/2015, 07/29/2016, 07/15/2017   Influenza, Seasonal, Injecte, Preservative Fre  08/14/2008   Influenza,inj,Quad PF,6+ Mos 07/20/2013, 06/20/2014   Influenza,inj,quad, With Preservative 08/18/2018   Influenza-Unspecified 08/18/2018, 08/07/2020   Moderna Sars-Covid-2 Vaccination 11/28/2019, 12/26/2019, 09/17/2020   Pneumococcal Conjugate-13 08/15/2014   Pneumococcal Polysaccharide-23 05/11/2012   Tdap 05/29/2014    Conditions to be addressed/monitored: HLD DMII HTN Obesity Insomia OSA  Care Plan : [place]  Updates made by Madelin Rear, [facility] since 11/23/2021 12:00 AM     Problem: DM2, HTN, CKD, Neuropathy, COPD, HLD, CAD, MI   Priority: High     Goal: Disease Management   Start Date: 11/23/2021  Expected End Date: 11/23/2022  Recent Progress: On track  Priority: High  Note:     Current Barriers:  Unable to independently afford treatment regimen Unable to independently monitor therapeutic efficacy  Pharmacist Clinical Goal(s):  Patient will verbalize ability to afford treatment regimen contact provider office for questions/concerns as evidenced notation of same in electronic health record through collaboration with PharmD and provider.   Interventions: 1:1 collaboration with Jon Billings, NP regarding development and update of comprehensive plan of care as evidenced by provider attestation and co-signature Inter-disciplinary care team collaboration (see longitudinal plan of care) Comprehensive medication review performed; medication list updated in electronic medical record  Hypertension (BP goal <130/80) -Controlled -Current treatment: Telmisartan 40 mg once daily  Clonidine 0.1 mh twice daily -Current home readings: <130/80 -Current dietary habits: no changes -Current exercise habits: no changes -Denies hypotensive/hypertensive symptoms -Educated on BP goals and benefits of medications for prevention of heart attack, stroke and kidney damage; -Counseled to monitor BP at home 1-2x/wk, document, and provide log at future  appointments -Recommended to continue current medication  Hyperlipidemia: (LDL goal < 55) -Not ideally controlled -Current treatment: Atorvastatin 40 mg once daily  -Reviewed tolerability - no problems noted  -Educated on Cholesterol goals;  Benefits of statin for ASCVD risk reduction; -Recommended to continue current medication Can consider up titration atorvastatin 40->80 mg or additional of Zetia 10 mg to achieve LDL <55 and 50% reduction in baseline per ADA 2023  Diabetes (A1c goal <7%) -Controlled -Current medications: Farxiga 10 mg daily Trulicity 9.41 mg injection once weekly Metformin 1000 mg twice daily with meal -Current home  glucose readings fasting glucose: not testing post prandial glucose: checking 2 hours after lunch - about 150 -Denies hypoglycemic/hyperglycemic symptoms -Educated on A1c and blood sugar goals; -Counseled to check feet daily and get yearly eye exams -Counseled on diet and exercise extensively Recommended to continue current medication  Patient Goals/Self-Care Activities Patient will:  - take medications as prescribed as evidenced by patient report and record review collaborate with provider on medication access solutions target a minimum of 150 minutes of moderate intensity exercise weekly      Medication Assistance:  Will check on status of trulicity PAP - pt unsure if an order is scheduled.  Patient's preferred pharmacy is:  [facility], [place] [address] Phone: [phone] Fax: [phone]  [facility] ([place], [place] [facility] [address] [place] [address] [place] [address] Phone: [phone] Fax: [phone]  [facility] ([facility] ) - [place], [place] [address] [place] [address] Phone: [phone] Fax: [phone]   Pt endorses 100% compliance  Follow  Up:  Patient agrees to Care Plan and Follow-up. Plan: HC check on trulicity PAP. Pharmacist 6 month telephone  Future Appointments  Date Time Provider [place]  12/01/2021  8:00 AM Jon Billings, NP [facility]  05/31/2022  1:00 PM CFP CCM PHARMACY [facility]   Madelin Rear, PharmD, [facility] Clinical Pharmacist  [facility]  [phone]

## 2021-12-01 ENCOUNTER — Telehealth: Payer: Self-pay

## 2021-12-01 ENCOUNTER — Ambulatory Visit (INDEPENDENT_AMBULATORY_CARE_PROVIDER_SITE_OTHER): Payer: Medicare Other | Admitting: Nurse Practitioner

## 2021-12-01 ENCOUNTER — Other Ambulatory Visit: Payer: Self-pay

## 2021-12-01 ENCOUNTER — Encounter: Payer: Self-pay | Admitting: Nurse Practitioner

## 2021-12-01 VITALS — BP 121/73 | HR 87 | Temp 98.2°F | Ht 70.0 in | Wt 207.2 lb

## 2021-12-01 DIAGNOSIS — Z1211 Encounter for screening for malignant neoplasm of colon: Secondary | ICD-10-CM

## 2021-12-01 DIAGNOSIS — G4733 Obstructive sleep apnea (adult) (pediatric): Secondary | ICD-10-CM | POA: Diagnosis not present

## 2021-12-01 DIAGNOSIS — E1142 Type 2 diabetes mellitus with diabetic polyneuropathy: Secondary | ICD-10-CM

## 2021-12-01 DIAGNOSIS — I152 Hypertension secondary to endocrine disorders: Secondary | ICD-10-CM

## 2021-12-01 DIAGNOSIS — J449 Chronic obstructive pulmonary disease, unspecified: Secondary | ICD-10-CM

## 2021-12-01 DIAGNOSIS — E1159 Type 2 diabetes mellitus with other circulatory complications: Secondary | ICD-10-CM

## 2021-12-01 DIAGNOSIS — I700 Atherosclerosis of aorta: Secondary | ICD-10-CM

## 2021-12-01 DIAGNOSIS — E785 Hyperlipidemia, unspecified: Secondary | ICD-10-CM | POA: Diagnosis not present

## 2021-12-01 DIAGNOSIS — E1169 Type 2 diabetes mellitus with other specified complication: Secondary | ICD-10-CM | POA: Diagnosis not present

## 2021-12-01 DIAGNOSIS — K219 Gastro-esophageal reflux disease without esophagitis: Secondary | ICD-10-CM

## 2021-12-01 MED ORDER — METFORMIN HCL 1000 MG PO TABS: 1000.0000 mg | ORAL_TABLET | Freq: Two times a day (BID) | ORAL | 4 refills | Status: DC

## 2021-12-01 MED ORDER — OMEPRAZOLE 40 MG PO CPDR: 40.0000 mg | DELAYED_RELEASE_CAPSULE | Freq: Every day | ORAL | 4 refills | Status: DC

## 2021-12-01 MED ORDER — TELMISARTAN 40 MG PO TABS: 40.0000 mg | ORAL_TABLET | Freq: Every day | ORAL | 4 refills | Status: DC

## 2021-12-01 NOTE — Assessment & Plan Note (Signed)
Continue with ASA.  Labs ordered at visit today. Continue with Atorvastatin 40mg  daily.  Follow up in 3 months.  Call sooner if concerns arise.

## 2021-12-01 NOTE — Assessment & Plan Note (Signed)
Chronic. Continue with Omeprazole.  Follow up if symptoms worsen or fail to improve.

## 2021-12-01 NOTE — Assessment & Plan Note (Signed)
Chronic.  Controlled.  Continue with current medication regimen.  Feels like breathing is about the same. Denies concerns at visit today.  Labs ordered today.  Return to clinic in 3 months for reevaluation.  Call sooner if concerns arise.

## 2021-12-01 NOTE — Assessment & Plan Note (Signed)
Chronic.  Controlled.  Continue with current medication regimen.  Blood sugars are running in the 170s with Farxiga 10mg  and Trulicity .75mg .  Will Increase Trulicity of W9L is not at goal.  On Gabapentin for Neuropathy.  States it works well for him.  Denies concerns at visit today.  Labs ordered today.  Return to clinic in 6 months for reevaluation.  Call sooner if concerns arise.

## 2021-12-01 NOTE — Assessment & Plan Note (Signed)
Chronic.  Controlled.  Continue with current medication regimen.  Blood sugars remain the same around 170s with Farxiga 10mg  and Trulicity .75mg .  Will Increase Trulicity of E5V is not at goal.  Patient receives Trulicity through patient assistance program.  Labs ordered today.  Return to clinic in 3 months for reevaluation.  Call sooner if concerns arise.

## 2021-12-01 NOTE — Assessment & Plan Note (Signed)
Chronic. Not well controlled.  Patient does not wear his CPAP machine. Encouraged to be compliant during visit today.

## 2021-12-01 NOTE — Assessment & Plan Note (Signed)
Chronic. Controlled.  Continue with current medication regimen of Atorvastatin 40mg  daily.  Labs ordered today.  Follow up in 3 months.

## 2021-12-01 NOTE — Progress Notes (Signed)
BP 121/73    Pulse 87    Temp 98.2 F (36.8 C) (Oral)    Ht 5\' 10"  (1.778 m)    Wt 207 lb 3.2 oz (94 kg)    SpO2 97%    BMI 29.73 kg/m    Subjective:    Patient ID: Juan Gist., male    DOB: [birth year], 76 y.o.   MRN: [id number]  HPI: Juan Troop. is a 76 y.o. male  Chief Complaint  Patient presents with   Diabetes   Hyperlipidemia   Hypertension   HYPERTENSION / [place] Satisfied with current treatment? yes Duration of hypertension: years BP monitoring frequency:sometimes BP range: 120-130/80 BP medication side effects: no Past BP meds:  telmisartan and clonidine Duration of hyperlipidemia: years Cholesterol medication side effects: no Cholesterol supplements: none Past cholesterol medications: atorvastain (lipitor) Medication compliance: excellent compliance Aspirin: yes Recent stressors: no Recurrent headaches: no Visual changes: no Palpitations: no Dyspnea: with excretion Chest pain: no Lower extremity edema: no Dizzy/lightheaded: no  DIABETES Now taking [place] and Trulicity. Hypoglycemic episodes:no Polydipsia/polyuria: no Visual disturbance: no Chest pain: no Paresthesias: yes Glucose Monitoring: yes  Accucheck frequency: 170  Fasting glucose:  Post prandial:  Evening:  Before meals: Taking Insulin?: no  Long acting insulin:  Short acting insulin: Blood Pressure Monitoring: sometimes Retinal Examination: Up to Date Foot Exam: Up to Date Diabetic Education: Not Completed Pneumovax: Up to Date Influenza: Up to Date Aspirin: yes  COPD COPD status: controlled Satisfied with current treatment?: yes Oxygen use: no Dyspnea frequency: when he is doing something physical Cough frequency:  Rescue inhaler frequency:  none Limitation of activity: yes Productive cough:  Last Spirometry: 03/04/2021 Pneumovax: Up to Date Influenza: Up to Date  SLEEP APNEA Sleep apnea status: uncontrolled Duration: chronic Satisfied with  current treatment?:  no CPAP use:  no- didn't like it Sleep quality with CPAP use: good Treament compliance:poor compliance Last sleep study:  Treatments attempted: CPAP Wakes feeling refreshed:  yes Daytime hypersomnolence:  no Fatigue:  no Insomnia:  no Good sleep hygiene:  no Difficulty falling asleep:  no Difficulty staying asleep:  no Snoring bothers bed partner:   unknown Observed apnea by bed partner: no Obesity:  yes Hypertension: yes  Pulmonary hypertension:  no Coronary artery disease:  yes  NEUROPATHY Patient states his neuropathy is about the same.  Stats the pain is a burning in his feet.  Feels like the gabapentin helps with his pain.    Relevant past medical, surgical, family and social history reviewed and updated as indicated. Interim medical history since our last visit reviewed. Allergies and medications reviewed and updated.  Review of Systems  Eyes:  Negative for visual disturbance.  Respiratory:  Positive for shortness of breath. Negative for chest tightness.   Cardiovascular:  Negative for chest pain, palpitations and leg swelling.  Endocrine: Negative for polydipsia and polyuria.  Musculoskeletal:        Black toenail  Neurological:  Positive for numbness. Negative for dizziness, light-headedness and headaches.   Per HPI unless specifically indicated above     Objective:    BP 121/73    Pulse 87    Temp 98.2 F (36.8 C) (Oral)    Ht 5\' 10"  (1.778 m)    Wt 207 lb 3.2 oz (94 kg)    SpO2 97%    BMI 29.73 kg/m   Wt Readings from Last 3 Encounters:  12/01/21 207 lb 3.2 oz (94 kg)  08/31/21 218  lb 9.6 oz (99.2 kg)  05/29/21 219 lb 4 oz (99.5 kg)    Physical Exam Vitals and nursing note reviewed.  Constitutional:      General: He is not in acute distress.    Appearance: Normal appearance. He is obese. He is not ill-appearing, toxic-appearing or diaphoretic.  HENT:     Head: Normocephalic.     Right Ear: External ear normal.     Left Ear:  External ear normal.     Nose: Nose normal. No congestion or rhinorrhea.     Mouth/Throat:     Mouth: Mucous membranes are moist.  Eyes:     General:        Right eye: No discharge.        Left eye: No discharge.     Extraocular Movements: Extraocular movements intact.     Conjunctiva/sclera: Conjunctivae normal.     Pupils: Pupils are equal, round, and reactive to light.  Cardiovascular:     Rate and Rhythm: Normal rate and regular rhythm.     Heart sounds: No murmur heard. Pulmonary:     Effort: Pulmonary effort is normal. No respiratory distress.     Breath sounds: Normal breath sounds. No wheezing, rhonchi or rales.  Abdominal:     General: Abdomen is flat. Bowel sounds are normal.  Musculoskeletal:     Cervical back: Normal range of motion and neck supple.  Feet:     Right foot:     Protective Sensation: 10 sites tested.  8 sites sensed.     Skin integrity: Callus and dry skin present. No skin breakdown, erythema or warmth.     Toenail Condition: Right toenails are abnormally thick and long. Fungal disease present.    Left foot:     Protective Sensation: 10 sites tested.  8 sites sensed.     Skin integrity: Callus and dry skin present. No skin breakdown, erythema or warmth.     Toenail Condition: Left toenails are abnormally thick and long.  Skin:    General: Skin is warm and dry.     Capillary Refill: Capillary refill takes less than 2 seconds.  Neurological:     General: No focal deficit present.     Mental Status: He is alert and oriented to person, place, and time.  Psychiatric:        Mood and Affect: Mood normal.        Behavior: Behavior normal.        Thought Content: Thought content normal.        Judgment: Judgment normal.    Results for orders placed or performed in visit on 08/31/21  Comp Met (CMET)  Result Value Ref Range   Glucose 121 (H) 70 - 99 mg/dL   BUN 14 8 - 27 mg/dL   Creatinine, Ser 0.90 0.76 - 1.27 mg/dL   eGFR 89 >59 mL/min/1.73    BUN/Creatinine Ratio 16 10 - 24   Sodium 141 134 - 144 mmol/L   Potassium 3.7 3.5 - 5.2 mmol/L   Chloride 102 96 - 106 mmol/L   CO2 19 (L) 20 - 29 mmol/L   Calcium 9.2 8.6 - 10.2 mg/dL   Total Protein 7.0 6.0 - 8.5 g/dL   Albumin 4.6 3.7 - 4.7 g/dL   Globulin, Total 2.4 1.5 - 4.5 g/dL   Albumin/Globulin Ratio 1.9 1.2 - 2.2   Bilirubin Total 0.3 0.0 - 1.2 mg/dL   Alkaline Phosphatase 64 44 - 121 IU/L   AST  36 0 - 40 IU/L   ALT 52 (H) 0 - 44 IU/L  HgB A1c  Result Value Ref Range   Hgb A1c MFr Bld 5.9 (H) 4.8 - 5.6 %   Est. average glucose Bld gHb Est-mCnc 123 mg/dL  Lipid Profile  Result Value Ref Range   Cholesterol, Total 128 100 - 199 mg/dL   Triglycerides 177 (H) 0 - 149 mg/dL   HDL 28 (L) >39 mg/dL   VLDL Cholesterol Cal 30 5 - 40 mg/dL   LDL Chol Calc (NIH) 70 0 - 99 mg/dL   Chol/HDL Ratio 4.6 0.0 - 5.0 ratio      Assessment & Plan:   Problem List Items Addressed This Visit       Cardiovascular and Mediastinum   Hypertension associated with diabetes ([place]) - Primary    Chronic.  Controlled.  Continue with current medication regimen.  Blood sugars remain the same around 170s with Farxiga $RemoveBefo'10mg'lakgSzKaFlr$  and Trulicity .$RemoveBefore'75mg'pZfilkOrdlhCG$ .  Will Increase Trulicity of I2M is not at goal.  Patient receives Trulicity through patient assistance program.  Labs ordered today.  Return to clinic in 3 months for reevaluation.  Call sooner if concerns arise.        Relevant Medications   metFORMIN (GLUCOPHAGE) 1000 MG tablet   telmisartan (MICARDIS) 40 MG tablet   Other Relevant Orders   Comp Met (CMET)   Aortic atherosclerosis (HCC)    Continue with ASA.  Labs ordered at visit today. Continue with Atorvastatin $RemoveBeforeDEI'40mg'fZWkXSyhEIoPlJzp$  daily.  Follow up in 3 months.  Call sooner if concerns arise.        Relevant Medications   telmisartan (MICARDIS) 40 MG tablet     Respiratory   Obstructive sleep apnea    Chronic. Not well controlled.  Patient does not wear his CPAP machine. Encouraged to be compliant during visit  today.       COPD with chronic bronchitis and emphysema (HCC)    Chronic.  Controlled.  Continue with current medication regimen.  Feels like breathing is about the same. Denies concerns at visit today.  Labs ordered today.  Return to clinic in 3 months for reevaluation.  Call sooner if concerns arise.          Digestive   GERD (gastroesophageal reflux disease)    Chronic. Continue with Omeprazole.  Follow up if symptoms worsen or fail to improve.       Relevant Medications   omeprazole (PRILOSEC) 40 MG capsule     Endocrine   Type 2 diabetes mellitus with peripheral neuropathy (HCC)    Chronic.  Controlled.  Continue with current medication regimen.  Blood sugars are running in the 170s with Farxiga $RemoveBefo'10mg'wbUjAlpRQVQ$  and Trulicity .$RemoveBefore'75mg'EYAfVOWrpDICt$ .  Will Increase Trulicity of B5D is not at goal.  On Gabapentin for Neuropathy.  States it works well for him.  Denies concerns at visit today.  Labs ordered today.  Return to clinic in 6 months for reevaluation.  Call sooner if concerns arise.        Relevant Medications   metFORMIN (GLUCOPHAGE) 1000 MG tablet   telmisartan (MICARDIS) 40 MG tablet   Other Relevant Orders   HgB A1c   Hyperlipidemia associated with type 2 diabetes mellitus (HCC)    Chronic. Controlled.  Continue with current medication regimen of Atorvastatin $RemoveBeforeD'40mg'evGdUwHqOaUMUt$  daily.  Labs ordered today.  Follow up in 3 months.      Relevant Medications   metFORMIN (GLUCOPHAGE) 1000 MG tablet   telmisartan (MICARDIS) 40 MG  tablet   Other Relevant Orders   Lipid Profile   Other Visit Diagnoses     Screening for colon cancer       Relevant Orders   Ambulatory referral to Gastroenterology        Follow up plan: Return in about 3 months (around 02/28/2022) for HTN, HLD, DM2 FU.

## 2021-12-01 NOTE — Telephone Encounter (Signed)
CALLED PATIENT NO ANSWER LEFT VOICEMAIL FOR A CALL BACK ? ?

## 2021-12-02 ENCOUNTER — Telehealth: Payer: Self-pay

## 2021-12-02 LAB — COMPREHENSIVE METABOLIC PANEL
ALT: 41 IU/L (ref 0–44)
AST: 24 IU/L (ref 0–40)
Albumin/Globulin Ratio: 2 (ref 1.2–2.2)
Albumin: 4.9 g/dL — ABNORMAL HIGH (ref 3.7–4.7)
Alkaline Phosphatase: 79 IU/L (ref 44–121)
BUN/Creatinine Ratio: 17 (ref 10–24)
BUN: 16 mg/dL (ref 8–27)
Bilirubin Total: 0.3 mg/dL (ref 0.0–1.2)
CO2: 20 mmol/L (ref 20–29)
Calcium: 9.7 mg/dL (ref 8.6–10.2)
Chloride: 99 mmol/L (ref 96–106)
Creatinine, Ser: 0.93 mg/dL (ref 0.76–1.27)
Globulin, Total: 2.4 g/dL (ref 1.5–4.5)
Glucose: 103 mg/dL — ABNORMAL HIGH (ref 70–99)
Potassium: 3.9 mmol/L (ref 3.5–5.2)
Sodium: 139 mmol/L (ref 134–144)
Total Protein: 7.3 g/dL (ref 6.0–8.5)
eGFR: 86 mL/min/{1.73_m2} (ref >59)

## 2021-12-02 LAB — LIPID PANEL
Chol/HDL Ratio: 4 ratio (ref 0.0–5.0)
Cholesterol, Total: 113 mg/dL (ref 100–199)
HDL: 28 mg/dL — ABNORMAL LOW (ref >39)
LDL Chol Calc (NIH): 48 mg/dL (ref 0–99)
Triglycerides: 235 mg/dL — ABNORMAL HIGH (ref 0–149)
VLDL Cholesterol Cal: 37 mg/dL (ref 5–40)

## 2021-12-02 LAB — HEMOGLOBIN A1C
Est. average glucose Bld gHb Est-mCnc: 108 mg/dL
Hgb A1c MFr Bld: 5.4 % (ref 4.8–5.6)

## 2021-12-02 NOTE — Telephone Encounter (Signed)
CALLED PATIENT NO ANSWER LEFT VOICEMAIL FOR A CALL BACK °Letter sent °

## 2021-12-02 NOTE — Progress Notes (Signed)
Please let patient know that his lab work looks great.  A1c has improved to 5.4.  His triglycerides are elevated. I recommend decreasing processed food and refined sugar intake.  Otherwise, his blood work looks great.  Keep up the good work.

## 2021-12-08 ENCOUNTER — Telehealth: Payer: Self-pay | Admitting: Nurse Practitioner

## 2021-12-08 NOTE — Telephone Encounter (Signed)
Copied from [place] [id number]. Topic: Quick Communication - Rx Refill/Question >> Dec 08, 2021 12:48 PM Pawlus, Brayton Layman A wrote: Pt stated he gets his Trulicity directly from the company who makes it and delivers it via [employer], denied using [facility] or [facility] or any pharmacy listed, pt stated Jon Billings would know what he is talking about, please advise pt how he can get a refill.

## 2021-12-08 NOTE — Telephone Encounter (Signed)
Patient gets Trulicity through patient assistance. Juan Le, can you and your team assist with this please?

## 2021-12-15 ENCOUNTER — Telehealth: Payer: Self-pay | Admitting: Nurse Practitioner

## 2021-12-15 ENCOUNTER — Telehealth: Payer: Self-pay

## 2021-12-15 DIAGNOSIS — E1142 Type 2 diabetes mellitus with diabetic polyneuropathy: Secondary | ICD-10-CM | POA: Diagnosis not present

## 2021-12-15 DIAGNOSIS — E785 Hyperlipidemia, unspecified: Secondary | ICD-10-CM

## 2021-12-15 DIAGNOSIS — E1169 Type 2 diabetes mellitus with other specified complication: Secondary | ICD-10-CM | POA: Diagnosis not present

## 2021-12-15 DIAGNOSIS — E1159 Type 2 diabetes mellitus with other circulatory complications: Secondary | ICD-10-CM

## 2021-12-15 DIAGNOSIS — I152 Hypertension secondary to endocrine disorders: Secondary | ICD-10-CM | POA: Diagnosis not present

## 2021-12-15 NOTE — Chronic Care Management (AMB) (Signed)
I have called [facility] about patients application for Trulicity, they stated they did not receive the missing information that was requested on 11/23/21. I have a confirmed fax given them the information the time date and fax number that was sent on 11/23/21 and they said the application has expired for more than 90 days and will need a new application to be sent in. Since it was sent in back in November.  Juan Le, Pierre

## 2021-12-15 NOTE — Telephone Encounter (Signed)
Copied from [place] [phone]. Topic: General - Inquiry >> Dec 15, 2021 11:04 AM Greggory Keen D wrote: Reason for CRM: Pt called saying he has not gotten his Trulicity lately.  He has one for next week and he will be out.  He said he gets it from [place] and the supplier.  He said it comes through the mail.  CB#  [phone]

## 2021-12-17 ENCOUNTER — Telehealth: Payer: Self-pay

## 2021-12-17 NOTE — Chronic Care Management (AMB) (Signed)
? ? ?  Chronic Care Management ?Pharmacy Assistant  ? ?Name: Juan Le.  MRN: [id number] DOB: 05-19-1946 ? ? ?Reason for Encounter: Patient assistance application Trulicity ? ? ?Medications: ?Outpatient Encounter Medications as of 12/17/2021  ?Medication Sig  ? aspirin 81 MG tablet Take 1 tablet by mouth daily.  ? atorvastatin (LIPITOR) 40 MG tablet TAKE 1 TABLET BY MOUTH  DAILY AT 6PM  ? Blood Glucose Monitoring Suppl (ONETOUCH VERIO FLEX SYSTEM) w/Device KIT USE UP TO 4 TIMES DAILY AS  DIRECTED  ? cloNIDine (CATAPRES) 0.1 MG tablet TAKE 1 TABLET BY MOUTH  TWICE DAILY  ? Dulaglutide (TRULICITY) 4.83 GN/3.5QN SOPN Inject 0.75 mg into the skin once a week.  ? FARXIGA 10 MG TABS tablet TAKE 1 TABLET BY MOUTH  DAILY BEFORE BREAKFAST  ? gabapentin (NEURONTIN) 400 MG capsule TAKE 2 CAPSULES BY MOUTH 3  TIMES DAILY  ? Lancets (ONETOUCH DELICA PLUS [id number]) MISC USE AS DIRECTED TO CHECK  BLOOD GLUCOSE ONCE DAILY  ? metFORMIN (GLUCOPHAGE) 1000 MG tablet Take 1 tablet (1,000 mg total) by mouth 2 (two) times daily with a meal.  ? omeprazole (PRILOSEC) 40 MG capsule Take 1 capsule (40 mg total) by mouth daily.  ? ONETOUCH VERIO test strip USE AS INSTRUCTED  TO TEST  BLOOD SUGAR TWO TIMES DAILY  ? QUEtiapine (SEROQUEL) 200 MG tablet Take 1 tablet (200 mg total) by mouth at bedtime.  ? telmisartan (MICARDIS) 40 MG tablet Take 1 tablet (40 mg total) by mouth daily.  ? ?No facility-administered encounter medications on file as of 12/17/2021.  ? ?I have resent another Patient assistance application for patient due to the application being expired more than 90 days. I have mailed out and prefilled a new application to the patient with specific details and left my contact information if needed. I attempted to reach out to the patient about this but I had left a voicemail.  ? ?Corrie Mckusick, RMA ?Health Concierge ? ?

## 2021-12-24 ENCOUNTER — Telehealth: Payer: Self-pay | Admitting: Nurse Practitioner

## 2021-12-24 NOTE — Telephone Encounter (Signed)
[name] dropped off  completed [place] work for the provider. Paperwork was place in [employer] account planner. ?

## 2021-12-25 NOTE — Telephone Encounter (Signed)
Forms faxed in for the patient. Called and let him know that this was done for him.  ?

## 2022-01-08 NOTE — Telephone Encounter (Signed)
Pt called in stating the forms were missing a date, pt stated a date for the medication needed to be added to the forms, please advise.  ?

## 2022-01-08 NOTE — Telephone Encounter (Signed)
Reprinted forms and included the date provider signed forms. Faxed to [employer] for patient ?

## 2022-02-04 DIAGNOSIS — E119 Type 2 diabetes mellitus without complications: Secondary | ICD-10-CM | POA: Diagnosis not present

## 2022-02-04 DIAGNOSIS — H2513 Age-related nuclear cataract, bilateral: Secondary | ICD-10-CM | POA: Diagnosis not present

## 2022-02-04 DIAGNOSIS — H353131 Nonexudative age-related macular degeneration, bilateral, early dry stage: Secondary | ICD-10-CM | POA: Diagnosis not present

## 2022-02-04 LAB — HM DIABETES EYE EXAM

## 2022-02-18 ENCOUNTER — Other Ambulatory Visit: Payer: Self-pay | Admitting: Nurse Practitioner

## 2022-02-18 DIAGNOSIS — I152 Hypertension secondary to endocrine disorders: Secondary | ICD-10-CM

## 2022-02-18 DIAGNOSIS — E1142 Type 2 diabetes mellitus with diabetic polyneuropathy: Secondary | ICD-10-CM

## 2022-02-18 DIAGNOSIS — E785 Hyperlipidemia, unspecified: Secondary | ICD-10-CM

## 2022-02-18 NOTE — Telephone Encounter (Signed)
Copied from [place] [phone]. Topic: General - Other ?>> Feb 18, 2022 11:01 AM Tessa Lerner A wrote: ?Reason for CRM: Medication Refill - Medication: QUEtiapine (SEROQUEL) 200 MG tablet [[id number]]  ? ?atorvastatin (LIPITOR) 40 MG tablet [[id number]]  ? ?cloNIDine (CATAPRES) 0.1 MG tablet [[id number]]  ? ?gabapentin (NEURONTIN) 400 MG capsule [[id number]]  ? ?Has the patient contacted their pharmacy? No. ?(Agent: If no, request that the patient contact the pharmacy for the refill. If patient does not wish to contact the pharmacy document the reason why and proceed with request.) ?(Agent: If yes, when and what did the pharmacy advise?) ? ?Preferred Pharmacy (with phone number or street name): [facility] ([facility] ) - [place], [address] [address] [place] [address] ?Phone: [phone] Fax: [phone] ?Hours: Not open 24 hours ? ?Has the patient been seen for an appointment in the last year OR does the patient have an upcoming appointment? Yes.   ? ?Agent: Please be advised that RX refills may take up to 3 business days. We ask that you follow-up with your pharmacy. ?

## 2022-02-18 NOTE — Telephone Encounter (Signed)
Requested medication (s) are due for refill today:   Provider to review Seroquel    Refills not due  ? ?Requested medication (s) are on the active medication list:   Yes for all of them ? ?Future visit scheduled:   Yes in 1 wk ? ? ?Last ordered: Seroquel 11/10/2021 #90, 1 refill - non delegated refill ?Atorvastatin 11/20/2021  #90, 1 refill;   Clonidine 11/20/2021  #180, 1 refill;    gabapentin 11/20/2021  #540, 1 refill ? ?Returned because mail order pharmacy requesting a 1 yr supply for these medications.  ? ?Requested Prescriptions  ?Pending Prescriptions Disp Refills  ? QUEtiapine (SEROQUEL) 200 MG tablet 90 tablet 1  ?  Sig: Take 1 tablet (200 mg total) by mouth at bedtime.  ?  ? Not Delegated - Psychiatry:  Antipsychotics - Second Generation ([place]) - quetiapine Failed - 02/18/2022 11:27 AM  ?  ?  Failed - This refill cannot be delegated  ?  ?  Failed - TSH in normal range and within 360 days  ?  TSH  ?Date Value Ref Range Status  ?09/04/2020 2.670 0.450 - 4.500 uIU/mL Final  ?  ?  ?  ?  Failed - Lipid Panel in normal range within the last 12 months  ?  Cholesterol, Total  ?Date Value Ref Range Status  ?12/01/2021 113 100 - 199 mg/dL Final  ? ?LDL Cholesterol (Calc)  ?Date Value Ref Range Status  ?11/29/2017 68 mg/dL (calc) Final  ?  Comment:  ?  Reference range: <100 ?Marland Kitchen ?Desirable range <100 mg/dL for primary prevention;   ?<70 mg/dL for patients with CHD or diabetic patients  ?with > or = 2 CHD risk factors. ?. ?LDL-C is now calculated using the Martin-Hopkins  ?calculation, which is a validated novel method providing  ?better accuracy than the Friedewald equation in the  ?estimation of LDL-C.  ?Cresenciano Genre et al. Annamaria Helling. 1610;960(45): 2061-2068  ?(http://education.[facility].com/faq/FAQ164) ?  ? ?LDL Chol Calc (NIH)  ?Date Value Ref Range Status  ?12/01/2021 48 0 - 99 mg/dL Final  ? ?HDL  ?Date Value Ref Range Status  ?12/01/2021 28 (L) >39 mg/dL Final  ? ?Triglycerides  ?Date Value Ref Range Status   ?12/01/2021 235 (H) 0 - 149 mg/dL Final  ? ?  ?  ?  Passed - Last BP in normal range  ?  BP Readings from Last 1 Encounters:  ?12/01/21 121/73  ?  ?  ?  ?  Passed - Last Heart Rate in normal range  ?  Pulse Readings from Last 1 Encounters:  ?12/01/21 87  ?  ?  ?  ?  Passed - Valid encounter within last 6 months  ?  Recent Outpatient Visits   ? ?      ? 2 months ago Hypertension associated with diabetes ([place])  ? [place], NP  ? 5 months ago Hypertension associated with diabetes [facility])  ? [place], NP  ? 8 months ago Hypertension associated with diabetes [facility])  ? [place], NP  ? 11 months ago Aortic atherosclerosis ([place])  ? [place], NP  ? 1 year ago Type 2 diabetes mellitus with peripheral neuropathy ([place])  ? [facility] [place], Henrine Screws T, NP  ? ?  ?  ?Future Appointments   ? ?        ? In 1 week Jon Billings, NP [facility]  ? ?  ? ? ?  ?  ?  Passed - CBC within normal limits and completed in the last 12 months  ?  WBC  ?Date Value Ref Range Status  ?03/04/2021 7.1 3.4 - 10.8 x10E3/uL Final  ?03/24/2017 7.1 3.8 - 10.8 K/uL Final  ? ?RBC  ?Date Value Ref Range Status  ?03/04/2021 4.41 4.14 - 5.80 x10E6/uL Final  ?03/24/2017 4.50 4.20 - 5.80 MIL/uL Final  ? ?Hemoglobin  ?Date Value Ref Range Status  ?03/04/2021 14.0 13.0 - 17.7 g/dL Final  ? ?Hematocrit  ?Date Value Ref Range Status  ?03/04/2021 41.1 37.5 - 51.0 % Final  ? ?MCHC  ?Date Value Ref Range Status  ?03/04/2021 34.1 31.5 - 35.7 g/dL Final  ?03/24/2017 34.3 32.0 - 36.0 g/dL Final  ? ?MCH  ?Date Value Ref Range Status  ?03/04/2021 31.7 26.6 - 33.0 pg Final  ?03/24/2017 32.2 27.0 - 33.0 pg Final  ? ?MCV  ?Date Value Ref Range Status  ?03/04/2021 93 79 - 97 fL Final  ? ?No results found for: PLTCOUNTKUC, LABPLAT, [place] ?RDW  ?Date Value Ref Range Status  ?03/04/2021 13.5 11.6 - 15.4 % Final   ? ?  ?  ?  Passed - CMP within normal limits and completed in the last 12 months  ?  Albumin  ?Date Value Ref Range Status  ?12/01/2021 4.9 (H) 3.7 - 4.7 g/dL Final  ? ?Alkaline Phosphatase  ?Date Value Ref Range Status  ?12/01/2021 79 44 - 121 IU/L Final  ? ?Alkaline phosphatase (APISO)  ?Date Value Ref Range Status  ?11/29/2017 55 40 - 115 U/L Final  ? ?ALT  ?Date Value Ref Range Status  ?12/01/2021 41 0 - 44 IU/L Final  ? ?AST  ?Date Value Ref Range Status  ?12/01/2021 24 0 - 40 IU/L Final  ? ?BUN  ?Date Value Ref Range Status  ?12/01/2021 16 8 - 27 mg/dL Final  ? ?Calcium  ?Date Value Ref Range Status  ?12/01/2021 9.7 8.6 - 10.2 mg/dL Final  ? ?CO2  ?Date Value Ref Range Status  ?12/01/2021 20 20 - 29 mmol/L Final  ? ?Creat  ?Date Value Ref Range Status  ?11/29/2017 1.03 0.70 - 1.18 mg/dL Final  ?  Comment:  ?  For patients >42 years of age, the reference limit ?for Creatinine is approximately 13% higher for people ?identified as African-American. ?. ?  ? ?Creatinine, Ser  ?Date Value Ref Range Status  ?12/01/2021 0.93 0.76 - 1.27 mg/dL Final  ? ?Creatinine, Urine  ?Date Value Ref Range Status  ?04/21/2017 206 20 - 370 mg/dL Final  ? ?Glucose  ?Date Value Ref Range Status  ?12/01/2021 103 (H) 70 - 99 mg/dL Final  ?11/12/2014 103 mg/dL Final  ? ?Glucose, Bld  ?Date Value Ref Range Status  ?11/29/2017 111 (H) 65 - 99 mg/dL Final  ?  Comment:  ?  . ?           Fasting reference interval ?. ?For someone without known diabetes, a glucose value ?between 100 and 125 mg/dL is consistent with ?prediabetes and should be confirmed with a ?follow-up test. ?. ?  ? ?POC Glucose  ?Date Value Ref Range Status  ?03/22/2017 125 (A) 70 - 99 mg/dl Final  ? ?Glucose-Capillary  ?Date Value Ref Range Status  ?01/28/2017 118 (H) 65 - 99 mg/dL Final  ? ?Potassium  ?Date Value Ref Range Status  ?12/01/2021 3.9 3.5 - 5.2 mmol/L Final  ? ?Sodium  ?Date Value Ref Range Status  ?12/01/2021 139 134 - 144 mmol/L Final  ? ?Bilirubin  Total   ?Date Value Ref Range Status  ?12/01/2021 0.3 0.0 - 1.2 mg/dL Final  ? ?Bilirubin, Total  ?Date Value Ref Range Status  ?11/12/2014 0.4 mg/dL Final  ? ?Bilirubin, Direct  ?Date Value Ref Range Status  ?02/05/2016 0.13 0.00 - 0.40 mg/dL Final  ? ?Protein,UA  ?Date Value Ref Range Status  ?03/04/2021 Negative Negative/Trace Final  ? ?Total Protein  ?Date Value Ref Range Status  ?12/01/2021 7.3 6.0 - 8.5 g/dL Final  ? ?GFR, Est African American  ?Date Value Ref Range Status  ?04/21/2017 86 >=60 mL/min Final  ? ?GFR calc Af Amer  ?Date Value Ref Range Status  ?12/05/2020 91 >59 mL/min/1.73 Final  ?  Comment:  ?  **In accordance with recommendations from the NKF-ASN Task force,** ?  [facility] is in the process of updating its eGFR calculation to the ?  2021 CKD-EPI creatinine equation that estimates kidney function ?  without a race variable. ?  ? ?eGFR  ?Date Value Ref Range Status  ?12/01/2021 86 >59 mL/min/1.73 Final  ? ?GFR, Est Non African American  ?Date Value Ref Range Status  ?04/21/2017 74 >=60 mL/min Final  ? ?GFR calc non Af Amer  ?Date Value Ref Range Status  ?12/05/2020 79 >59 mL/min/1.73 Final  ? ?  ?  ?  ? atorvastatin (LIPITOR) 40 MG tablet 90 tablet 1  ?  Sig: TAKE 1 TABLET BY MOUTH  DAILY AT 6PM  ?  ? Cardiovascular:  Antilipid - Statins Failed - 02/18/2022 11:27 AM  ?  ?  Failed - Lipid Panel in normal range within the last 12 months  ?  Cholesterol, Total  ?Date Value Ref Range Status  ?12/01/2021 113 100 - 199 mg/dL Final  ? ?LDL Cholesterol (Calc)  ?Date Value Ref Range Status  ?11/29/2017 68 mg/dL (calc) Final  ?  Comment:  ?  Reference range: <100 ?Marland Kitchen ?Desirable range <100 mg/dL for primary prevention;   ?<70 mg/dL for patients with CHD or diabetic patients  ?with > or = 2 CHD risk factors. ?. ?LDL-C is now calculated using the Martin-Hopkins  ?calculation, which is a validated novel method providing  ?better accuracy than the Friedewald equation in the  ?estimation of LDL-C.  ?Cresenciano Genre et al. Annamaria Helling.  5830;746(00): 2061-2068  ?(http://education.[facility].com/faq/FAQ164) ?  ? ?LDL Chol Calc (NIH)  ?Date Value Ref Range Status  ?12/01/2021 48 0 - 99 mg/dL Final  ? ?HDL  ?Date Value Ref Range Status  ?12/01/18

## 2022-02-19 ENCOUNTER — Telehealth: Payer: Self-pay

## 2022-02-19 ENCOUNTER — Other Ambulatory Visit: Payer: Self-pay | Admitting: Nurse Practitioner

## 2022-02-19 DIAGNOSIS — E1159 Type 2 diabetes mellitus with other circulatory complications: Secondary | ICD-10-CM

## 2022-02-19 DIAGNOSIS — E1169 Type 2 diabetes mellitus with other specified complication: Secondary | ICD-10-CM

## 2022-02-19 DIAGNOSIS — E1142 Type 2 diabetes mellitus with diabetic polyneuropathy: Secondary | ICD-10-CM

## 2022-02-19 NOTE — Chronic Care Management (AMB) (Signed)
    Chronic Care Management Pharmacy Assistant   Name: Juan Le.  MRN: [id number] DOB: 03-Feb-1946  Reason for Encounter: Disease State General  Recent office visits:  12/04/21-Juan Mathis Dad, NP (PCP) General follow up visit. Labs ordered. Ambulatory referral to Gastroenterology. Follow up in 3 months.  08/31/21-Juan Mathis Dad, NP (PCP) General follow up visit. Labs ordered. Ambulatory referral to Podiatry.  Recent consult visits:  09/07/21-Juan Le, DPM (Podiatry) Seen for a nail problem.  Hospital visits:  None in previous 6 months  Medications: Outpatient Encounter Medications as of 02/19/2022  Medication Sig   aspirin 81 MG tablet Take 1 tablet by mouth daily.   atorvastatin (LIPITOR) 40 MG tablet TAKE 1 TABLET BY MOUTH  DAILY AT 6PM   Blood Glucose Monitoring Suppl (ONETOUCH VERIO FLEX SYSTEM) w/Device KIT USE UP TO 4 TIMES DAILY AS  DIRECTED   cloNIDine (CATAPRES) 0.1 MG tablet TAKE 1 TABLET BY MOUTH  TWICE DAILY   Dulaglutide (TRULICITY) 2.53 GU/4.4IH SOPN Inject 0.75 mg into the skin once a week.   FARXIGA 10 MG TABS tablet TAKE 1 TABLET BY MOUTH  DAILY BEFORE BREAKFAST   gabapentin (NEURONTIN) 400 MG capsule TAKE 2 CAPSULES BY MOUTH 3  TIMES DAILY   Lancets (ONETOUCH DELICA PLUS [id number]) MISC USE AS DIRECTED TO CHECK  BLOOD GLUCOSE ONCE DAILY   metFORMIN (GLUCOPHAGE) 1000 MG tablet Take 1 tablet (1,000 mg total) by mouth 2 (two) times daily with a meal.   omeprazole (PRILOSEC) 40 MG capsule Take 1 capsule (40 mg total) by mouth daily.   ONETOUCH VERIO test strip USE AS INSTRUCTED  TO TEST  BLOOD SUGAR TWO TIMES DAILY   QUEtiapine (SEROQUEL) 200 MG tablet Take 1 tablet (200 mg total) by mouth at bedtime.   telmisartan (MICARDIS) 40 MG tablet Take 1 tablet (40 mg total) by mouth daily.   No facility-administered encounter medications on file as of 02/19/2022.   Contacted Juan Le. for General Review Call  Unsuccessful attempts to complete  assessment call. I have called patient 3x and left 3 voicemail's for the patient to return my call when available.   Care Gaps: Zoster Vaccines:Never done COLONOSCOPY:Last completed: Jan 28, 2017  Star Rating Drugs: Metformin 1000 mg Last filled:01/20/22 100 DS Telmisartan 40 mg Last filled:02/05/22 100 DS Atorvastatin 40 mg Last filled:12/16/21 100 DS Farxiga 10 mg Last filled:07/09/21 90 DS  Juan Le, [place]

## 2022-02-22 NOTE — Telephone Encounter (Signed)
Call to [facility]- patient insurance is requesting 100 day supply which was filled in 2/23. Patient does have remainder of Rx left to fill (6 month supply was sent). Advised can not send new Rx until 6 month refill has been used. ?Requested Prescriptions  ?Pending Prescriptions Disp Refills  ?? atorvastatin (LIPITOR) 40 MG tablet [Pharmacy Med Name: Atorvastatin Calcium 40 MG Oral Tablet] 100 tablet 2  ?  Sig: TAKE 1 TABLET BY MOUTH DAILY AT  6PM  ?  ? Cardiovascular:  Antilipid - Statins Failed - 02/19/2022 10:08 PM  ?  ?  Failed - Lipid Panel in normal range within the last 12 months  ?  Cholesterol, Total  ?Date Value Ref Range Status  ?12/01/2021 113 100 - 199 mg/dL Final  ? ?LDL Cholesterol (Calc)  ?Date Value Ref Range Status  ?11/29/2017 68 mg/dL (calc) Final  ?  Comment:  ?  Reference range: <100 ?Marland Kitchen ?Desirable range <100 mg/dL for primary prevention;   ?<70 mg/dL for patients with CHD or diabetic patients  ?with > or = 2 CHD risk factors. ?. ?LDL-C is now calculated using the Martin-Hopkins  ?calculation, which is a validated novel method providing  ?better accuracy than the Friedewald equation in the  ?estimation of LDL-C.  ?Cresenciano Genre et al. Annamaria Helling. 7253;664(40): 2061-2068  ?(http://education.[employer].com/faq/FAQ164) ?  ? ?LDL Chol Calc (NIH)  ?Date Value Ref Range Status  ?12/01/2021 48 0 - 99 mg/dL Final  ? ?HDL  ?Date Value Ref Range Status  ?12/01/2021 28 (L) >39 mg/dL Final  ? ?Triglycerides  ?Date Value Ref Range Status  ?12/01/2021 235 (H) 0 - 149 mg/dL Final  ? ?  ?  ?  Passed - Patient is not pregnant  ?  ?  Passed - Valid encounter within last 12 months  ?  Recent Outpatient Visits   ?      ? 2 months ago Hypertension associated with diabetes ([place])  ? [place], NP  ? 5 months ago Hypertension associated with diabetes [facility])  ? [place], NP  ? 8 months ago Hypertension associated with diabetes [facility])  ? [place], NP  ? 11 months ago Aortic atherosclerosis ([place])  ? [place], NP  ? 1 year ago Type 2 diabetes mellitus with peripheral neuropathy ([place])  ? [facility] [place], Henrine Screws T, NP  ?  ?  ?Future Appointments   ?        ? In 1 week Jon Billings, NP [facility], [facility]  ?  ? ?  ?  ?  ?? cloNIDine (CATAPRES) 0.1 MG tablet [Pharmacy Med Name: cloNIDine HCl 0.1 MG Oral Tablet] 200 tablet 2  ?  Sig: TAKE 1 TABLET BY MOUTH TWICE  DAILY  ?  ? Cardiovascular:  Alpha-2 Agonists Passed - 02/19/2022 10:08 PM  ?  ?  Passed - Last BP in normal range  ?  BP Readings from Last 1 Encounters:  ?12/01/21 121/73  ?   ?  ?  Passed - Last Heart Rate in normal range  ?  Pulse Readings from Last 1 Encounters:  ?12/01/21 87  ?   ?  ?  Passed - Valid encounter within last 6 months  ?  Recent Outpatient Visits   ?      ? 2 months ago Hypertension associated with diabetes ([place])  ? [place], NP  ? 5 months ago Hypertension  associated with diabetes ([place])  ? [place], NP  ? 8 months ago Hypertension associated with diabetes [facility])  ? [place], NP  ? 11 months ago Aortic atherosclerosis ([place])  ? [place], NP  ? 1 year ago Type 2 diabetes mellitus with peripheral neuropathy ([place])  ? [facility] [place], Henrine Screws T, NP  ?  ?  ?Future Appointments   ?        ? In 1 week Jon Billings, NP [facility], [facility]  ?  ? ?  ?  ?  ?? gabapentin (NEURONTIN) 400 MG capsule [Pharmacy Med Name: Gabapentin 400 MG Oral Capsule] 600 capsule 2  ?  Sig: TAKE 2 CAPSULES BY MOUTH 3 TIMES DAILY  ?  ? Neurology: Anticonvulsants - gabapentin Passed - 02/19/2022 10:08 PM  ?  ?  Passed - Cr in normal range and within 360 days  ?  Creat  ?Date Value Ref Range Status  ?11/29/2017 1.03 0.70 - 1.18 mg/dL Final  ?  Comment:  ?  For patients >49 years of  age, the reference limit ?for Creatinine is approximately 13% higher for people ?identified as African-American. ?. ?  ? ?Creatinine, Ser  ?Date Value Ref Range Status  ?12/01/2021 0.93 0.76 - 1.27 mg/dL Final  ? ?Creatinine, Urine  ?Date Value Ref Range Status  ?04/21/2017 206 20 - 370 mg/dL Final  ?   ?  ?  Passed - Completed PHQ-2 or PHQ-9 in the last 360 days  ?  ?  Passed - Valid encounter within last 12 months  ?  Recent Outpatient Visits   ?      ? 2 months ago Hypertension associated with diabetes ([place])  ? [place], NP  ? 5 months ago Hypertension associated with diabetes [facility])  ? [place], NP  ? 8 months ago Hypertension associated with diabetes [facility])  ? [place], NP  ? 11 months ago Aortic atherosclerosis ([place])  ? [place], NP  ? 1 year ago Type 2 diabetes mellitus with peripheral neuropathy ([place])  ? [facility] [place], Henrine Screws T, NP  ?  ?  ?Future Appointments   ?        ? In 1 week Jon Billings, NP [facility], [facility]  ?  ? ?  ?  ?  ? ?

## 2022-02-26 NOTE — Progress Notes (Signed)
? ?BP 132/67   Pulse (!) 56   Temp 98.6 ?F (37 ?C) (Oral)   Wt 209 lb 9.6 oz (95.1 kg)   SpO2 98%   BMI 30.07 kg/m?   ? ?Subjective:  ? ? Patient ID: Juan Gist., male    DOB: 12/24/1945, 76 y.o.   MRN: [id number] ? ?HPI: ?Juan Trefz. is a 76 y.o. male ? ?Chief Complaint  ?Patient presents with  ? Hypertension  ?  3 month follow up    ? Hyperlipidemia  ? Diabetes  ? ?HYPERTENSION / HYPERLIPIDEMIA ?Satisfied with current treatment? yes ?Duration of hypertension: years ?BP monitoring frequency:no ?BP range:  ?BP medication side effects: no ?Past BP meds:  telmisartan and clonidine ?Duration of hyperlipidemia: years ?Cholesterol medication side effects: no ?Cholesterol supplements: none ?Past cholesterol medications: atorvastain (lipitor) ?Medication compliance: excellent compliance ?Aspirin: yes ?Recent stressors: no ?Recurrent headaches: no ?Visual changes: no ?Palpitations: no ?Dyspnea: with excretion ?Chest pain: no ?Lower extremity edema: no ?Dizzy/lightheaded: no ? ?DIABETES ?Now taking [place] and Trulicity. ?Hypoglycemic episodes:no ?Polydipsia/polyuria: no ?Visual disturbance: no ?Chest pain: no ?Paresthesias: yes ?Glucose Monitoring: no ? Accucheck frequency: hasn't been checking it ? Fasting glucose: ? Post prandial: ? Evening: ? Before meals: ?Taking Insulin?: no ? Long acting insulin: ? Short acting insulin: ?Blood Pressure Monitoring: sometimes ?Retinal Examination: Up to Date ?Foot Exam: Up to Date ?Diabetic Education: Not Completed ?Pneumovax: Up to Date ?Influenza: Up to Date ?Aspirin: yes ? ?COPD ?COPD status: controlled ?Satisfied with current treatment?: yes ?Oxygen use: no ?Dyspnea frequency: when he is doing something physical ?Cough frequency:  ?Rescue inhaler frequency:  none ?Limitation of activity: yes ?Productive cough:  ?Last Spirometry: 03/04/2021 ?Pneumovax: Up to Date ?Influenza: Up to Date ? ?SLEEP APNEA ?Doesn't even have a CPAP machine.  Not interested in getting  another machine or doing another sleep study. ?Sleep apnea status: uncontrolled ?Duration: chronic ?Satisfied with current treatment?:  no ?CPAP use:  no- didn't like it ?Sleep quality with CPAP use: good ?Treament compliance:poor compliance ?Last sleep study:  ?Treatments attempted: CPAP ?Wakes feeling refreshed:  yes ?Daytime hypersomnolence:  no ?Fatigue:  no ?Insomnia:  no ?Good sleep hygiene:  no ?Difficulty falling asleep:  no ?Difficulty staying asleep:  no ?Snoring bothers bed partner:   unknown ?Observed apnea by bed partner: no ?Obesity:  yes ?Hypertension: yes  ?Pulmonary hypertension:  no ?Coronary artery disease:  yes ? ?NEUROPATHY ?Patient states his neuropathy is about the same.  Stats the pain is a burning in his feet.  Feels like the gabapentin helps with his pain.   ? ?Relevant past medical, surgical, family and social history reviewed and updated as indicated. Interim medical history since our last visit reviewed. ?Allergies and medications reviewed and updated. ? ?Review of Systems  ?Eyes:  Negative for visual disturbance.  ?Respiratory:  Positive for shortness of breath. Negative for chest tightness.   ?Cardiovascular:  Negative for chest pain, palpitations and leg swelling.  ?Endocrine: Negative for polydipsia and polyuria.  ?Musculoskeletal:   ?     Black toenail  ?Neurological:  Positive for numbness. Negative for dizziness, light-headedness and headaches.  ? ?Per HPI unless specifically indicated above ? ?   ?Objective:  ?  ?BP 132/67   Pulse (!) 56   Temp 98.6 ?F (37 ?C) (Oral)   Wt 209 lb 9.6 oz (95.1 kg)   SpO2 98%   BMI 30.07 kg/m?   ?Wt Readings from Last 3 Encounters:  ?03/01/22 209 lb 9.6  oz (95.1 kg)  ?12/01/21 207 lb 3.2 oz (94 kg)  ?08/31/21 218 lb 9.6 oz (99.2 kg)  ?  ?Physical Exam ?Vitals and nursing note reviewed.  ?Constitutional:   ?   General: He is not in acute distress. ?   Appearance: Normal appearance. He is obese. He is not ill-appearing, toxic-appearing or  diaphoretic.  ?HENT:  ?   Head: Normocephalic.  ?   Right Ear: External ear normal.  ?   Left Ear: External ear normal.  ?   Nose: Nose normal. No congestion or rhinorrhea.  ?   Mouth/Throat:  ?   Mouth: Mucous membranes are moist.  ?Eyes:  ?   General:     ?   Right eye: No discharge.     ?   Left eye: No discharge.  ?   Extraocular Movements: Extraocular movements intact.  ?   Conjunctiva/sclera: Conjunctivae normal.  ?   Pupils: Pupils are equal, round, and reactive to light.  ?Cardiovascular:  ?   Rate and Rhythm: Normal rate and regular rhythm.  ?   Heart sounds: No murmur heard. ?Pulmonary:  ?   Effort: Pulmonary effort is normal. No respiratory distress.  ?   Breath sounds: Normal breath sounds. No wheezing, rhonchi or rales.  ?Abdominal:  ?   General: Abdomen is flat. Bowel sounds are normal.  ?Musculoskeletal:  ?   Cervical back: Normal range of motion and neck supple.  ?Feet:  ?   Right foot:  ?   Protective Sensation: 10 sites tested.  8 sites sensed.  ?   Skin integrity: Callus and dry skin present. No skin breakdown, erythema or warmth.  ?   Toenail Condition: Right toenails are abnormally thick and long. Fungal disease present. ?   Left foot:  ?   Protective Sensation: 10 sites tested.  8 sites sensed.  ?   Skin integrity: Callus and dry skin present. No skin breakdown, erythema or warmth.  ?   Toenail Condition: Left toenails are abnormally thick and long.  ?Skin: ?   General: Skin is warm and dry.  ?   Capillary Refill: Capillary refill takes less than 2 seconds.  ?Neurological:  ?   General: No focal deficit present.  ?   Mental Status: He is alert and oriented to person, place, and time.  ?Psychiatric:     ?   Mood and Affect: Mood normal.     ?   Behavior: Behavior normal.     ?   Thought Content: Thought content normal.     ?   Judgment: Judgment normal.  ? ? ?Results for orders placed or performed in visit on 02/09/22  ?HM DIABETES EYE EXAM  ?Result Value Ref Range  ? HM Diabetic Eye Exam No  Retinopathy No Retinopathy  ? ?   ?Assessment & Plan:  ? ?Problem List Items Addressed This Visit   ? ?  ? Cardiovascular and Mediastinum  ? Hypertension associated with diabetes ([place]) - Primary  ?  Chronic.  Controlled.  Continue with current medication regimen.  Not currently checking blood sugars. Continue Farxiga 90XY and Trulicity .14m.  Will Increase Trulicity of AB3Xis not at goal.  Patient receives Trulicity through patient assistance program.  Labs ordered today.  Return to clinic in 3 months for reevaluation.  Call sooner if concerns arise.  ? ? ?  ?  ? Relevant Medications  ? dapagliflozin propanediol (FARXIGA) 10 MG TABS tablet  ? cloNIDine (CATAPRES) 0.1  MG tablet  ? atorvastatin (LIPITOR) 40 MG tablet  ? Other Relevant Orders  ? Comp Met (CMET)  ? Aortic atherosclerosis ([place])  ?  Continue with ASA.  Labs ordered at visit today. Continue with Atorvastatin 12m daily.  Refills sent today.  Follow up in 3 months.  Call sooner if concerns arise.   ? ?  ?  ? Relevant Medications  ? cloNIDine (CATAPRES) 0.1 MG tablet  ? atorvastatin (LIPITOR) 40 MG tablet  ?  ? Respiratory  ? Obstructive sleep apnea  ?  Chronic. Does not use CPAP.  Does not want to get a different machine or new sleep study. Will continue to reassess at future visits. ? ?  ?  ? COPD with chronic bronchitis and emphysema ([place]  ?  Chronic.  Controlled.  Continue with current medication regimen.  Denies concerns about breathing today.  Denies concerns at visit today.  Labs ordered today.  Return to clinic in 3 months for reevaluation.  Call sooner if concerns arise.  ? ? ?  ?  ?  ? Endocrine  ? Type 2 diabetes mellitus with peripheral neuropathy (HCC)  ?  Chronic.  Controlled.  Continue with current medication regimen of Gabapentin.  Refill sent today.  Labs ordered today.  Return to clinic in 6 months for reevaluation.  Call sooner if concerns arise.  ? ? ?  ?  ? Relevant Medications  ? dapagliflozin propanediol (FARXIGA) 10 MG TABS tablet   ? QUEtiapine (SEROQUEL) 200 MG tablet  ? gabapentin (NEURONTIN) 400 MG capsule  ? atorvastatin (LIPITOR) 40 MG tablet  ? Other Relevant Orders  ? HgB A1c  ? Hyperlipidemia associated with type 2 diabetes mellitus ([place]

## 2022-03-01 ENCOUNTER — Ambulatory Visit (INDEPENDENT_AMBULATORY_CARE_PROVIDER_SITE_OTHER): Payer: Medicare Other | Admitting: Nurse Practitioner

## 2022-03-01 ENCOUNTER — Encounter: Payer: Self-pay | Admitting: Nurse Practitioner

## 2022-03-01 VITALS — BP 132/67 | HR 56 | Temp 98.6°F | Wt 209.6 lb

## 2022-03-01 DIAGNOSIS — I152 Hypertension secondary to endocrine disorders: Secondary | ICD-10-CM

## 2022-03-01 DIAGNOSIS — D519 Vitamin B12 deficiency anemia, unspecified: Secondary | ICD-10-CM | POA: Diagnosis not present

## 2022-03-01 DIAGNOSIS — E785 Hyperlipidemia, unspecified: Secondary | ICD-10-CM

## 2022-03-01 DIAGNOSIS — G4733 Obstructive sleep apnea (adult) (pediatric): Secondary | ICD-10-CM

## 2022-03-01 DIAGNOSIS — I700 Atherosclerosis of aorta: Secondary | ICD-10-CM | POA: Diagnosis not present

## 2022-03-01 DIAGNOSIS — J449 Chronic obstructive pulmonary disease, unspecified: Secondary | ICD-10-CM | POA: Diagnosis not present

## 2022-03-01 DIAGNOSIS — E1142 Type 2 diabetes mellitus with diabetic polyneuropathy: Secondary | ICD-10-CM | POA: Diagnosis not present

## 2022-03-01 DIAGNOSIS — E1169 Type 2 diabetes mellitus with other specified complication: Secondary | ICD-10-CM

## 2022-03-01 DIAGNOSIS — E1159 Type 2 diabetes mellitus with other circulatory complications: Secondary | ICD-10-CM

## 2022-03-01 MED ORDER — DAPAGLIFLOZIN PROPANEDIOL 10 MG PO TABS
10.0000 mg | ORAL_TABLET | Freq: Every day | ORAL | 1 refills | Status: DC
Start: 2022-03-01 — End: 2022-10-28

## 2022-03-01 MED ORDER — CLONIDINE HCL 0.1 MG PO TABS: 0.1000 mg | ORAL_TABLET | Freq: Two times a day (BID) | ORAL | 1 refills | Status: DC

## 2022-03-01 MED ORDER — QUETIAPINE FUMARATE 200 MG PO TABS: 200.0000 mg | ORAL_TABLET | Freq: Every day | ORAL | 1 refills | Status: DC

## 2022-03-01 MED ORDER — ATORVASTATIN CALCIUM 40 MG PO TABS: ORAL_TABLET | ORAL | 1 refills | Status: DC

## 2022-03-01 MED ORDER — GABAPENTIN 400 MG PO CAPS: ORAL_CAPSULE | ORAL | 1 refills | Status: DC

## 2022-03-01 NOTE — Assessment & Plan Note (Signed)
Chronic. Controlled.  Continue with current medication regimen of Atorvastatin 40mg daily. Labs ordered today.  Return in 3 months.  Call sooner if concerns arise. 

## 2022-03-01 NOTE — Assessment & Plan Note (Signed)
Chronic. Does not use CPAP.  Does not want to get a different machine or new sleep study. Will continue to reassess at future visits. 

## 2022-03-01 NOTE — Assessment & Plan Note (Signed)
Chronic.  Controlled.  Continue with current medication regimen.  Denies concerns about breathing today.  Denies concerns at visit today.  Labs ordered today.  Return to clinic in 3 months for reevaluation.  Call sooner if concerns arise.   

## 2022-03-01 NOTE — Assessment & Plan Note (Signed)
Continue with ASA.  Labs ordered at visit today. Continue with Atorvastatin 40mg daily.  Refills sent today.  Follow up in 3 months.  Call sooner if concerns arise.   

## 2022-03-01 NOTE — Assessment & Plan Note (Signed)
Chronic.  Controlled.  Continue with current medication regimen of Gabapentin.  Refill sent today.  Labs ordered today.  Return to clinic in 6 months for reevaluation.  Call sooner if concerns arise.   

## 2022-03-01 NOTE — Assessment & Plan Note (Signed)
Labs ordered today.  Will make recommendations based on lab results. ?

## 2022-03-01 NOTE — Assessment & Plan Note (Signed)
Chronic.  Controlled.  Continue with current medication regimen.  Not currently checking blood sugars. Continue Farxiga 10mg and Trulicity .75mg.  Will Increase Trulicity of A1c is not at goal.  Patient receives Trulicity through patient assistance program.  Labs ordered today.  Return to clinic in 3 months for reevaluation.  Call sooner if concerns arise.   

## 2022-03-02 LAB — CBC WITH DIFFERENTIAL/PLATELET
Basophils Absolute: 0 10*3/uL (ref 0.0–0.2)
Basos: 1 %
EOS (ABSOLUTE): 0.1 10*3/uL (ref 0.0–0.4)
Eos: 2 %
Hematocrit: 41.8 % (ref 37.5–51.0)
Hemoglobin: 14.3 g/dL (ref 13.0–17.7)
Immature Grans (Abs): 0 10*3/uL (ref 0.0–0.1)
Immature Granulocytes: 1 %
Lymphocytes Absolute: 2.3 10*3/uL (ref 0.7–3.1)
Lymphs: 37 %
MCH: 31.8 pg (ref 26.6–33.0)
MCHC: 34.2 g/dL (ref 31.5–35.7)
MCV: 93 fL (ref 79–97)
Monocytes Absolute: 0.5 10*3/uL (ref 0.1–0.9)
Monocytes: 8 %
Neutrophils Absolute: 3.2 10*3/uL (ref 1.4–7.0)
Neutrophils: 51 %
Platelets: 138 10*3/uL — ABNORMAL LOW (ref 150–450)
RBC: 4.5 x10E6/uL (ref 4.14–5.80)
RDW: 13.8 % (ref 11.6–15.4)
WBC: 6.2 10*3/uL (ref 3.4–10.8)

## 2022-03-02 LAB — COMPREHENSIVE METABOLIC PANEL
ALT: 45 IU/L — ABNORMAL HIGH (ref 0–44)
AST: 26 IU/L (ref 0–40)
Albumin/Globulin Ratio: 1.9 (ref 1.2–2.2)
Albumin: 4.5 g/dL (ref 3.7–4.7)
Alkaline Phosphatase: 62 IU/L (ref 44–121)
BUN/Creatinine Ratio: 17 (ref 10–24)
BUN: 15 mg/dL (ref 8–27)
Bilirubin Total: 0.3 mg/dL (ref 0.0–1.2)
CO2: 18 mmol/L — ABNORMAL LOW (ref 20–29)
Calcium: 9.3 mg/dL (ref 8.6–10.2)
Chloride: 104 mmol/L (ref 96–106)
Creatinine, Ser: 0.9 mg/dL (ref 0.76–1.27)
Globulin, Total: 2.4 g/dL (ref 1.5–4.5)
Glucose: 100 mg/dL — ABNORMAL HIGH (ref 70–99)
Potassium: 3.9 mmol/L (ref 3.5–5.2)
Sodium: 142 mmol/L (ref 134–144)
Total Protein: 6.9 g/dL (ref 6.0–8.5)
eGFR: 89 mL/min/{1.73_m2} (ref >59)

## 2022-03-02 LAB — VITAMIN B12: Vitamin B-12: 505 pg/mL (ref 232–1245)

## 2022-03-02 LAB — HEMOGLOBIN A1C
Est. average glucose Bld gHb Est-mCnc: 114 mg/dL
Hgb A1c MFr Bld: 5.6 % (ref 4.8–5.6)

## 2022-03-02 NOTE — Progress Notes (Signed)
Please let patient know that his lab work looks great.  A1c remains well controlled at 5.6.  Continue with current medication regimen.  No other concerns at this time.  Follow up as discussed.

## 2022-05-05 ENCOUNTER — Other Ambulatory Visit: Payer: Self-pay | Admitting: Nurse Practitioner

## 2022-05-05 DIAGNOSIS — E1142 Type 2 diabetes mellitus with diabetic polyneuropathy: Secondary | ICD-10-CM

## 2022-05-05 DIAGNOSIS — I152 Hypertension secondary to endocrine disorders: Secondary | ICD-10-CM

## 2022-05-05 DIAGNOSIS — E1169 Type 2 diabetes mellitus with other specified complication: Secondary | ICD-10-CM

## 2022-05-06 ENCOUNTER — Telehealth: Payer: Self-pay

## 2022-05-06 NOTE — Chronic Care Management (AMB) (Signed)
    Chronic Care Management Pharmacy Assistant   Name: [name]  MRN: [id number] DOB: [birth year]   Reason for Encounter: Disease State Hypertension   Recent office visits:  03/01/22-Karen Mathis Dad, NP (PCP) General follow up visit. Labs ordered. Follow up in 3 months.  12/01/21-Karen Mathis Dad, NP (PCP) General follow up visit. Labs ordered. Ambulatory referral to Gastroenterology. Follow up in 3 months.   Recent consult visits:  02/04/22-Reid D. Woodard [facility]) Notes not available.   Hospital visits:  None in previous 6 months  Medications: Outpatient Encounter Medications as of 05/06/2022  Medication Sig   aspirin 81 MG tablet Take 1 tablet by mouth daily.   atorvastatin (LIPITOR) 40 MG tablet TAKE 1 TABLET BY MOUTH  DAILY AT 6PM   Blood Glucose Monitoring Suppl (ONETOUCH VERIO FLEX SYSTEM) w/Device KIT USE UP TO 4 TIMES DAILY AS  DIRECTED   cloNIDine (CATAPRES) 0.1 MG tablet Take 1 tablet (0.1 mg total) by mouth 2 (two) times daily.   dapagliflozin propanediol (FARXIGA) 10 MG TABS tablet Take 1 tablet (10 mg total) by mouth daily before breakfast.   Dulaglutide (TRULICITY) 3.82 NK/5.3ZJ SOPN Inject 0.75 mg into the skin once a week.   gabapentin (NEURONTIN) 400 MG capsule Take 3 capsules by mouth 3 times daily   Lancets (ONETOUCH DELICA PLUS [id number]) MISC USE AS DIRECTED TO CHECK  BLOOD GLUCOSE ONCE DAILY   metFORMIN (GLUCOPHAGE) 1000 MG tablet Take 1 tablet (1,000 mg total) by mouth 2 (two) times daily with a meal.   omeprazole (PRILOSEC) 40 MG capsule Take 1 capsule (40 mg total) by mouth daily.   ONETOUCH VERIO test strip USE AS INSTRUCTED  TO TEST  BLOOD SUGAR TWO TIMES DAILY   QUEtiapine (SEROQUEL) 200 MG tablet Take 1 tablet (200 mg total) by mouth at bedtime.   telmisartan (MICARDIS) 40 MG tablet Take 1 tablet (40 mg total) by mouth daily.   No facility-administered encounter medications on file as of 05/06/2022.   Current antihypertensive regimen:   Telmisartan 40 mg take 1 tab daily  Unsuccessful attempts to complete assessment call. I have called patient 3x and left 3 voicemail's for the patient to return my call when available.   Adherence Review: Is the patient currently on ACE/ARB medication? Yes Does the patient have >5 day gap between last estimated fill dates? Yes   Care Gaps: Zoster Vaccines:Never done COLONOSCOPY:Last completed: Jan 28, 2017  Star Rating Drugs: Atorvastatin 40 mg Last filled:03/01/22 100 DS Farxiga 10 mg Last KWIOXB:35/32/99 90 DS Trulicity 2.42 mg Last ASTMHD:62/22/97 28 DS Metformin 1000 mg Last filled:04/25/22 100 DS Telmisartan 40 mg Last filled:02/05/22 100 DS  Corrie Mckusick, [place]

## 2022-05-06 NOTE — Telephone Encounter (Signed)
Requested medications are due for refill today.too soon  Requested medications are on the active medications list.  yes  Last refill. 03/01/2022 for all 3  Future visit scheduled.   yes  Notes to clinic.  Pharmacy requesting 1 year prescription.    Requested Prescriptions  Pending Prescriptions Disp Refills   cloNIDine (CATAPRES) 0.1 MG tablet [Pharmacy Med Name: cloNIDine HCl 0.1 MG Oral Tablet] 200 tablet 1    Sig: TAKE 1 TABLET BY MOUTH TWICE  DAILY     Cardiovascular:  Alpha-2 Agonists Passed - 05/05/2022 11:31 PM      Passed - Last BP in normal range    BP Readings from Last 1 Encounters:  03/01/22 132/67         Passed - Last Heart Rate in normal range    Pulse Readings from Last 1 Encounters:  03/01/22 (!) 56         Passed - Valid encounter within last 6 months    Recent Outpatient Visits           2 months ago Hypertension associated with diabetes ([place])   [facility] Jon Billings, NP   5 months ago Hypertension associated with diabetes ([place])   [facility] Jon Billings, NP   8 months ago Hypertension associated with diabetes ([place])   [facility] Jon Billings, NP   11 months ago Hypertension associated with diabetes ([place])   [facility] Jon Billings, NP   1 year ago Aortic atherosclerosis ([place])   [facility] Jon Billings, NP       Future Appointments             In 3 weeks Jon Billings, NP [place]             atorvastatin (LIPITOR) 40 MG tablet [Pharmacy Med Name: Atorvastatin Calcium 40 MG Oral Tablet] 100 tablet 2    Sig: TAKE 1 TABLET BY MOUTH DAILY AT  6PM     Cardiovascular:  Antilipid - Statins Failed - 05/05/2022 11:31 PM      Failed - Lipid Panel in normal range within the last 12 months    Cholesterol, Total  Date Value Ref Range Status  12/01/2021 113 100 - 199 mg/dL Final   LDL Cholesterol (Calc)  Date Value Ref Range  Status  11/29/2017 68 mg/dL (calc) Final    Comment:    Reference range: <100 . Desirable range <100 mg/dL for primary prevention;   <70 mg/dL for patients with CHD or diabetic patients  with > or = 2 CHD risk factors. Marland Kitchen LDL-C is now calculated using the Martin-Hopkins  calculation, which is a validated novel method providing  better accuracy than the Friedewald equation in the  estimation of LDL-C.  Cresenciano Genre et al. Annamaria Helling. 9326;712(45): 2061-2068  (http://education.[facility].com/faq/FAQ164)    LDL Chol Calc (NIH)  Date Value Ref Range Status  12/01/2021 48 0 - 99 mg/dL Final   HDL  Date Value Ref Range Status  12/01/2021 28 (L) >39 mg/dL Final   Triglycerides  Date Value Ref Range Status  12/01/2021 235 (H) 0 - 149 mg/dL Final         Passed - Patient is not pregnant      Passed - Valid encounter within last 12 months    Recent Outpatient Visits           2 months ago Hypertension associated with diabetes [facility])   [facility] Jon Billings, NP  5 months ago Hypertension associated with diabetes [facility])   [facility] Jon Billings, NP   8 months ago Hypertension associated with diabetes [facility])   [facility] Jon Billings, NP   11 months ago Hypertension associated with diabetes ([place])   [facility] Jon Billings, NP   1 year ago Aortic atherosclerosis ([place])   [facility] Jon Billings, NP       Future Appointments             In 3 weeks Jon Billings, NP [place]             gabapentin (NEURONTIN) 400 MG capsule [Pharmacy Med Name: Gabapentin 400 MG Oral Capsule] 900 capsule 2    Sig: TAKE 3 CAPSULES BY MOUTH 3 TIMES DAILY     Neurology: Anticonvulsants - gabapentin Passed - 05/05/2022 11:31 PM      Passed - Cr in normal range and within 360 days    Creat  Date Value Ref Range Status  11/29/2017 1.03 0.70 - 1.18 mg/dL Final    Comment:     For patients >74 years of age, the reference limit for Creatinine is approximately 13% higher for people identified as African-American. .    Creatinine, Ser  Date Value Ref Range Status  03/01/2022 0.90 0.76 - 1.27 mg/dL Final   Creatinine, Urine  Date Value Ref Range Status  04/21/2017 206 20 - 370 mg/dL Final         Passed - Completed PHQ-2 or PHQ-9 in the last 360 days      Passed - Valid encounter within last 12 months    Recent Outpatient Visits           2 months ago Hypertension associated with diabetes ([place])   [facility] Jon Billings, NP   5 months ago Hypertension associated with diabetes [facility])   [facility] Jon Billings, NP   8 months ago Hypertension associated with diabetes [facility])   [facility] Jon Billings, NP   11 months ago Hypertension associated with diabetes [facility])   [facility] Jon Billings, NP   1 year ago Aortic atherosclerosis ([place])   [facility] Jon Billings, NP       Future Appointments             In 3 weeks Jon Billings, NP [facility] [place]             c

## 2022-05-18 ENCOUNTER — Telehealth: Payer: Self-pay | Admitting: Gastroenterology

## 2022-05-18 NOTE — Telephone Encounter (Signed)
Patient left vm requesting a call back to schedule a colonoscopy.

## 2022-05-19 ENCOUNTER — Telehealth: Payer: Self-pay

## 2022-05-19 ENCOUNTER — Other Ambulatory Visit: Payer: Self-pay

## 2022-05-19 DIAGNOSIS — Z86010 Personal history of colonic polyps: Secondary | ICD-10-CM

## 2022-05-19 MED ORDER — NA SULFATE-K SULFATE-MG SULF 17.5-3.13-1.6 GM/177ML PO SOLN: 1.0000 | Freq: Once | ORAL | 0 refills | Status: AC

## 2022-05-19 NOTE — Telephone Encounter (Signed)
Gastroenterology Pre-Procedure Review  Request Date: 06/29/22 Requesting Physician: Dr. Allen Norris  PATIENT REVIEW QUESTIONS: The patient responded to the following health history questions as indicated:    1. Are you having any GI issues? no 2. Do you have a personal history of Polyps? yes (01/28/17 with Dr. Allen Norris) 3. Do you have a family history of Colon Cancer or Polyps? yes (father colon cancer) 4. Diabetes Mellitus? yes (type 2) 5. Joint replacements in the past 12 months?no 6. Major health problems in the past 3 months?no 7. Any artificial heart valves, MVP, or defibrillator? Patient has stents No cardiologist     MEDICATIONS & ALLERGIES:    Patient reports the following regarding taking any anticoagulation/antiplatelet therapy:   Plavix, Coumadin, Eliquis, Xarelto, Lovenox, Pradaxa, Brilinta, or Effient? no Aspirin? yes (81 mg daily)  Patient confirms/reports the following medications:  Current Outpatient Medications  Medication Sig Dispense Refill   aspirin 81 MG tablet Take 1 tablet by mouth daily.     atorvastatin (LIPITOR) 40 MG tablet TAKE 1 TABLET BY MOUTH  DAILY AT 6PM 90 tablet 1   Blood Glucose Monitoring Suppl (ONETOUCH VERIO FLEX SYSTEM) w/Device KIT USE UP TO 4 TIMES DAILY AS  DIRECTED 1 kit 0   cloNIDine (CATAPRES) 0.1 MG tablet Take 1 tablet (0.1 mg total) by mouth 2 (two) times daily. 180 tablet 1   dapagliflozin propanediol (FARXIGA) 10 MG TABS tablet Take 1 tablet (10 mg total) by mouth daily before breakfast. 90 tablet 1   Dulaglutide (TRULICITY) 9.76 YO/3.7CH SOPN Inject 0.75 mg into the skin once a week. 6 mL 1   gabapentin (NEURONTIN) 400 MG capsule Take 3 capsules by mouth 3 times daily 540 capsule 1   Lancets (ONETOUCH DELICA PLUS [id number]) MISC USE AS DIRECTED TO CHECK  BLOOD GLUCOSE ONCE DAILY 100 each 3   metFORMIN (GLUCOPHAGE) 1000 MG tablet Take 1 tablet (1,000 mg total) by mouth 2 (two) times daily with a meal. 180 tablet 4   omeprazole (PRILOSEC) 40 MG  capsule Take 1 capsule (40 mg total) by mouth daily. 90 capsule 4   ONETOUCH VERIO test strip USE AS INSTRUCTED  TO TEST  BLOOD SUGAR TWO TIMES DAILY 200 strip 3   QUEtiapine (SEROQUEL) 200 MG tablet Take 1 tablet (200 mg total) by mouth at bedtime. 90 tablet 1   telmisartan (MICARDIS) 40 MG tablet Take 1 tablet (40 mg total) by mouth daily. 90 tablet 4   No current facility-administered medications for this visit.    Patient confirms/reports the following allergies:  Allergies  Allergen Reactions   Ace Inhibitors Cough   Diphenhydramine Rash    stomach upset    No orders of the defined types were placed in this encounter.   AUTHORIZATION INFORMATION Primary Insurance: 1D#: Group #:  Secondary Insurance: 1D#: Group #:  SCHEDULE INFORMATION: Date: 06/29/22 Time: Location: [facility]

## 2022-05-19 NOTE — Telephone Encounter (Signed)
Returned patients phone call to schedule his repeat colonoscopy with Dr. Allen Norris (History of Colon Polyps 01/28/17).  LVM for him to call office back to schedule.    Scheduling Note: He has a referral in from 12/01/21.  Thanks,  [place], [place]

## 2022-05-20 ENCOUNTER — Encounter: Payer: Self-pay | Admitting: Nurse Practitioner

## 2022-05-26 ENCOUNTER — Ambulatory Visit: Payer: Medicare Other | Admitting: Nurse Practitioner

## 2022-05-26 NOTE — Progress Notes (Deleted)
There were no vitals taken for this visit.   Subjective:    Patient ID: Juan Gist., male    DOB: 08/24/1946, 75 y.o.   MRN: [id number]  HPI: Juan Rout. is a 76 y.o. male  No chief complaint on file.  05/26/2022  Assessment:  Preoperative evaluation and clearance show: {Preop assessment:15915} and patient is cleared for diagnosis for surgical procedure.  Other diagnoses affecting surgical risk include: {DIAGNOSIS, UOHFG:90211}   Type of Surgery: Low, <1% perioperative cardiac risk (cataract, minor head & neck, minor prostate, superficial surgeries) Intermediate, 1% - 5% cardiac risk (major intraabdominal, intrathoracic, orthopedic, major head & neck, prostatectomy) High, >5% cardiac risk (aortic repair, non-carotid major vascular, peripheral vascular, prolonged procedure, anticipated large blood loss, major emergency/trauma)  Lee's Revised Cardiac Index: 0 Risk class I, very low, 0.4% risk of cardiac complications 1 Risk class II, low, 0.9% risk of cardiac complications 2 Risk class III, moderate, 6.6% risk of cardiac complications 3+ Risk class IV, high, 11% risk of cardiac complications  Plan:  1. Patient requires endocarditis prophylaxis: {yes/no:63}. 2. GENERAL PREOP INSTRUCTIONS: Proceed with surgery as planned. No food or liquids the morning of surgery. Call surgeon if develops respiratory illness, fever, or other illness. 3. Medications to Hold: *** for 7 days before surgery, unless instructed otherwise by surgeon. 4. EKG:  05/26/2022 : No acute ST changes noted *** 5. ***Chest X-ray:  05/26/2022 *** : No acute process. 6. Ordered Labs: ***  From a medical standpoint the patient is an acceptable candidates to undergo general anesthesia for surgery. ***  It is recommended to correct electrolytes and to keep the patient euvolemic and avoid significant fluid shifts during surgery.  Labs reviewed and pt is cleared for surgery. Pt denies a hx of adverse  reactions to anesthesia.    Juan Gist. is a 76 y.o. male who presents to the office today for a preoperative consultation at the request of ***, who will perform a  *** on *** at {GEN PROCEDURE DBZMC:80223}.  Current Complaints: ***  Past Medical History:  Diagnosis Date   Anxiety    Atelectasis of left lung 03/25/2017   Chronic, continuous use of opioids    COPD (chronic obstructive pulmonary disease) (HCC)    Degenerative disc disease, lumbar    Diabetes (HCC)    DJD (degenerative joint disease)    GERD (gastroesophageal reflux disease)    Heart attack ([place])    History of diverticulitis    Hypertension    Hypertension    Hypertriglyceridemia    Hypokalemia    Insomnia    Mitral regurgitation    Sleep apnea    Vitamin D deficiency    Wears dentures    full upper   Family History  Problem Relation Age of Onset   Throat cancer Father    Colon cancer Father    Heart disease Father    Cancer Father    Depression Father    Hypertension Father    Parkinson's disease Father    Arthritis Brother    Stroke Mother    Kidney disease Mother    Heart disease Mother    Kidney failure Mother    Breast cancer Paternal Aunt    Depression Sister    Stroke Maternal Grandmother    Bone cancer Brother    Stroke Brother    Prostate cancer Neg Hx    Kidney cancer Neg Hx    Bladder Cancer Neg Hx  Current Outpatient Medications  Medication Sig Dispense Refill   aspirin 81 MG tablet Take 1 tablet by mouth daily.     atorvastatin (LIPITOR) 40 MG tablet TAKE 1 TABLET BY MOUTH  DAILY AT 6PM 90 tablet 1   Blood Glucose Monitoring Suppl (ONETOUCH VERIO FLEX SYSTEM) w/Device KIT USE UP TO 4 TIMES DAILY AS  DIRECTED 1 kit 0   cloNIDine (CATAPRES) 0.1 MG tablet Take 1 tablet (0.1 mg total) by mouth 2 (two) times daily. 180 tablet 1   dapagliflozin propanediol (FARXIGA) 10 MG TABS tablet Take 1 tablet (10 mg total) by mouth daily before breakfast. 90 tablet 1   Dulaglutide  (TRULICITY) 1.70 YF/7.4BS SOPN Inject 0.75 mg into the skin once a week. 6 mL 1   gabapentin (NEURONTIN) 400 MG capsule Take 3 capsules by mouth 3 times daily 540 capsule 1   Lancets (ONETOUCH DELICA PLUS [id number]) MISC USE AS DIRECTED TO CHECK  BLOOD GLUCOSE ONCE DAILY 100 each 3   metFORMIN (GLUCOPHAGE) 1000 MG tablet Take 1 tablet (1,000 mg total) by mouth 2 (two) times daily with a meal. 180 tablet 4   omeprazole (PRILOSEC) 40 MG capsule Take 1 capsule (40 mg total) by mouth daily. 90 capsule 4   ONETOUCH VERIO test strip USE AS INSTRUCTED  TO TEST  BLOOD SUGAR TWO TIMES DAILY 200 strip 3   QUEtiapine (SEROQUEL) 200 MG tablet Take 1 tablet (200 mg total) by mouth at bedtime. 90 tablet 1   telmisartan (MICARDIS) 40 MG tablet Take 1 tablet (40 mg total) by mouth daily. 90 tablet 4   No current facility-administered medications for this visit.   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)  Tobacco Use   Smoking status: Former    Types: Cigarettes   Smokeless tobacco: Never   Tobacco comments:    former light smoker; quit in his teens  Vaping Use   Vaping Use: Never used  Substance and Sexual Activity   Alcohol use: No    Alcohol/week: 0.0 standard drinks of alcohol   Drug use: No   Sexual activity: Yes    Partners: Female  Other Topics Concern   Not on file  Social History Narrative   Not on file   Social Determinants of Health   Financial Resource Strain: Low Risk  (07/15/2021)   Overall Financial Resource Strain (CARDIA)    Difficulty of Paying Living Expenses: Not hard at all  Food Insecurity: No Food Insecurity (07/15/2021)    Hunger Vital Sign    Worried About Running Out of Food in the Last Year: Never true    [place] in the Last Year: Never true  Transportation Needs: No Transportation Needs (07/15/2021)   PRAPARE - Hydrologist (Medical): No    Lack of Transportation (Non-Medical): No  Physical Activity: Inactive (07/15/2021)   Exercise Vital Sign    Days of Exercise per Week: 0 days    Minutes of Exercise per Session: 0 min  Stress: No Stress Concern Present (07/15/2021)   [employer]  [employer] - Occupational Stress Questionnaire    Feeling of Stress : Not at all  Social Connections: Moderately Isolated (07/15/2021)   Social Connection and Isolation Panel [NHANES]    Frequency of Communication with Friends and Family: Twice a week    Frequency of Social Gatherings with Friends and Family: Once a week    Attends Religious Services: Never    Database administrator or Organizations: No    Attends Banker Meetings: Never    Marital Status: Married  Catering manager Violence: Not At Risk (07/15/2021)   Humiliation, Afraid, Rape, and Kick questionnaire    Fear of Current or Ex-Partner: No    Emotionally Abused: No    Physically Abused: No    Sexually Abused: No     Preoperative Risk Factors  1. Major predictors that require intensive management and may lead to delay in or cancellation of the operative procedure unless emergent:  {yes/no:63}Unstable coronary syndromes including unstable or severe angina or recent MI  {yes/no:63}Decompensated heart failure including NYHA functional class 4 or worsening or new onset HF  {yes/no:63} Significant arrhythmia including high grade AV block, symptomatic ventricular arrhythmias, supraventricular arrhythmias with a ventricular rate >100 bpm at rest, symptomatic bradycardia, and newly recognized ventricular tachycardia  {yes/no:63} Severe heart valve disease including severe aortic stenosis or  symptomatic mitral stenosis  2. Additional independent predictors of major cardiac complications:  {yes/no:63}Hgh risk type of surgery (Vascular surgery and any open intraperitoneal or intrathoracic procedures)  Other clinical predictors that warrant careful assessment of current status  {yes/no:63}History of ischemic heart disease {yes/no:63}History of CVA {yes/no:63}  History of compensated heart failure or prior heart failure {yes/no:63} Diabetes mellitus on Insulin {yes/no:63} Renal insufficiency  3. Perioperative cardiac and long term risk is increased in pts unable to meet a 4-MET demand during most normal daily activities:  {YES / NO} Ability to climb 2 flights of stairs or walk four blocks  Objective:  There were no vitals taken for this visit.  There is no height or weight on file to calculate BMI.     Juan Le 05/26/22  Relevant past medical, surgical, family and social history reviewed and updated as indicated. Interim medical history since our last visit reviewed. Allergies and medications reviewed and updated.  Review of Systems  Per HPI unless specifically indicated above     Objective:    There were no vitals taken for this visit.  Wt Readings from Last 3 Encounters:  03/01/22 209 lb 9.6 oz (95.1 kg)  12/01/21 207 lb 3.2 oz (94 kg)  08/31/21 218 lb 9.6 oz (99.2 kg)    Physical Exam  Results for orders placed or performed in visit on 03/01/22  Comp Met (CMET)  Result Value Ref Range   Glucose 100 (H) 70 - 99 mg/dL   BUN 15 8 - 27 mg/dL   Creatinine, Ser 2.61 0.76 - 1.27 mg/dL   eGFR 89 >34 TF/ION/6.70   BUN/Creatinine Ratio 17 10 - 24   Sodium 142 134 - 144 mmol/L   Potassium 3.9 3.5 - 5.2 mmol/L   Chloride 104 96 - 106 mmol/L   CO2 18 (L) 20 - 29 mmol/L   Calcium 9.3 8.6 - 10.2 mg/dL   Total Protein 6.9 6.0 - 8.5 g/dL   Albumin 4.5 3.7 - 4.7 g/dL   Globulin, Total 2.4 1.5 - 4.5 g/dL   Albumin/Globulin Ratio 1.9 1.2 - 2.2    Bilirubin Total 0.3 0.0 - 1.2 mg/dL  Alkaline Phosphatase 62 44 - 121 IU/L   AST 26 0 - 40 IU/L   ALT 45 (H) 0 - 44 IU/L  HgB A1c  Result Value Ref Range   Hgb A1c MFr Bld 5.6 4.8 - 5.6 %   Est. average glucose Bld gHb Est-mCnc 114 mg/dL  CBC w/Diff  Result Value Ref Range   WBC 6.2 3.4 - 10.8 x10E3/uL   RBC 4.50 4.14 - 5.80 x10E6/uL   Hemoglobin 14.3 13.0 - 17.7 g/dL   Hematocrit 41.8 37.5 - 51.0 %   MCV 93 79 - 97 fL   MCH 31.8 26.6 - 33.0 pg   MCHC 34.2 31.5 - 35.7 g/dL   RDW 13.8 11.6 - 15.4 %   Platelets 138 (L) 150 - 450 x10E3/uL   Neutrophils 51 Not Estab. %   Lymphs 37 Not Estab. %   Monocytes 8 Not Estab. %   Eos 2 Not Estab. %   Basos 1 Not Estab. %   Neutrophils Absolute 3.2 1.4 - 7.0 x10E3/uL   Lymphocytes Absolute 2.3 0.7 - 3.1 x10E3/uL   Monocytes Absolute 0.5 0.1 - 0.9 x10E3/uL   EOS (ABSOLUTE) 0.1 0.0 - 0.4 x10E3/uL   Basophils Absolute 0.0 0.0 - 0.2 x10E3/uL   Immature Granulocytes 1 Not Estab. %   Immature Grans (Abs) 0.0 0.0 - 0.1 x10E3/uL  B12  Result Value Ref Range   Vitamin B-12 505 232 - 1,245 pg/mL      Assessment & Plan:   Problem List Items Addressed This Visit       Cardiovascular and Mediastinum   Hypertension associated with diabetes ([place]) - Primary   Atherosclerosis of coronary artery   MI (mitral incompetence)   Aortic atherosclerosis (HCC)     Respiratory   Obstructive sleep apnea   COPD with chronic bronchitis and emphysema (HCC)     Endocrine   Type 2 diabetes mellitus with peripheral neuropathy ([place])   Hyperlipidemia associated with type 2 diabetes mellitus ([place])     Follow up plan: No follow-ups on file.

## 2022-05-27 ENCOUNTER — Encounter: Payer: Self-pay | Admitting: Nurse Practitioner

## 2022-05-27 ENCOUNTER — Ambulatory Visit (INDEPENDENT_AMBULATORY_CARE_PROVIDER_SITE_OTHER): Payer: Medicare Other | Admitting: Nurse Practitioner

## 2022-05-27 VITALS — BP 136/82 | HR 66 | Temp 98.6°F | Wt 205.8 lb

## 2022-05-27 DIAGNOSIS — Z01818 Encounter for other preprocedural examination: Secondary | ICD-10-CM | POA: Diagnosis not present

## 2022-05-27 DIAGNOSIS — E785 Hyperlipidemia, unspecified: Secondary | ICD-10-CM

## 2022-05-27 DIAGNOSIS — G4733 Obstructive sleep apnea (adult) (pediatric): Secondary | ICD-10-CM

## 2022-05-27 DIAGNOSIS — I152 Hypertension secondary to endocrine disorders: Secondary | ICD-10-CM | POA: Diagnosis not present

## 2022-05-27 DIAGNOSIS — J449 Chronic obstructive pulmonary disease, unspecified: Secondary | ICD-10-CM | POA: Diagnosis not present

## 2022-05-27 DIAGNOSIS — I700 Atherosclerosis of aorta: Secondary | ICD-10-CM | POA: Diagnosis not present

## 2022-05-27 DIAGNOSIS — I2510 Atherosclerotic heart disease of native coronary artery without angina pectoris: Secondary | ICD-10-CM | POA: Diagnosis not present

## 2022-05-27 DIAGNOSIS — E1169 Type 2 diabetes mellitus with other specified complication: Secondary | ICD-10-CM

## 2022-05-27 DIAGNOSIS — Z136 Encounter for screening for cardiovascular disorders: Secondary | ICD-10-CM | POA: Diagnosis not present

## 2022-05-27 DIAGNOSIS — I340 Nonrheumatic mitral (valve) insufficiency: Secondary | ICD-10-CM | POA: Diagnosis not present

## 2022-05-27 DIAGNOSIS — E1159 Type 2 diabetes mellitus with other circulatory complications: Secondary | ICD-10-CM | POA: Diagnosis not present

## 2022-05-27 DIAGNOSIS — J439 Emphysema, unspecified: Secondary | ICD-10-CM

## 2022-05-27 DIAGNOSIS — E1142 Type 2 diabetes mellitus with diabetic polyneuropathy: Secondary | ICD-10-CM

## 2022-05-27 LAB — URINALYSIS, ROUTINE W REFLEX MICROSCOPIC
Bilirubin, UA: NEGATIVE
Leukocytes,UA: NEGATIVE
Nitrite, UA: NEGATIVE
Protein,UA: NEGATIVE
RBC, UA: NEGATIVE
Specific Gravity, UA: 1.02 (ref 1.005–1.030)
Urobilinogen, Ur: 0.2 mg/dL (ref 0.2–1.0)
pH, UA: 5.5 (ref 5.0–7.5)

## 2022-05-27 NOTE — Assessment & Plan Note (Signed)
Chronic.  Controlled.  Continue with current medication regimen.  Denies concerns about breathing today.  Denies concerns at visit today.  Labs ordered today.  Return to clinic in 3 months for reevaluation.  Call sooner if concerns arise.

## 2022-05-27 NOTE — Progress Notes (Signed)
BP 136/82   Pulse 66   Temp 98.6 F (37 C) (Oral)   Wt 205 lb 12.8 oz (93.4 kg)   SpO2 98%   BMI 29.53 kg/m    Subjective:    Patient ID: Juan Gist., male    DOB: Sep 15, 1946, 76 y.o.   MRN: [id number]  HPI: Juan Mcquarrie. is a 76 y.o. male  Chief Complaint  Patient presents with   Medical Clearance   05/27/2022  Assessment:  Preoperative evaluation and clearance show: No contraindications to planned surgery and patient is cleared for diagnosis for surgical procedure.  Other diagnoses affecting surgical risk include: Evaluation   Type of Surgery: Low, <1% perioperative cardiac risk (cataract, minor head & neck, minor prostate, superficial surgeries) Intermediate, 1% - 5% cardiac risk (major   Lee's Revised Cardiac Index:  1 Risk class II, low, 0.9% risk of cardiac complications  Plan:  1. Patient requires endocarditis prophylaxis: no. 2. GENERAL PREOP INSTRUCTIONS: Proceed with surgery as planned. No food or liquids the morning of surgery. Call surgeon if develops respiratory illness, fever, or other illness. 3. Medications to Hold: ASA for 7 days before surgery, unless instructed otherwise by surgeon. 4. EKG:  05/27/2022 : No acute ST changes noted.  Does have AV Block which is not new. Noted on EKG from 2019. 5. Ordered Labs: CMP, CBC, A1c, UA, Lipid, TSH  From a medical standpoint the patient is an acceptable candidates to undergo general anesthesia for surgery. It is recommended to correct electrolytes and to keep the patient euvolemic and avoid significant fluid shifts during surgery.  Labs reviewed and pt is cleared for surgery. Pt denies a hx of adverse reactions to anesthesia.    Juan Gist. is a 76 y.o. male who presents to the office today for a preoperative consultation at the request of Dr. Allen Norris, who will perform a  Colonoscopy on 06/29/2022 at  [facility] .  Current Complaints: None  Past Medical History:  Diagnosis Date   Anxiety     Atelectasis of left lung 03/25/2017   Chronic, continuous use of opioids    COPD (chronic obstructive pulmonary disease) (HCC)    Degenerative disc disease, lumbar    Diabetes (HCC)    DJD (degenerative joint disease)    GERD (gastroesophageal reflux disease)    Heart attack ([place])    History of diverticulitis    Hypertension    Hypertension    Hypertriglyceridemia    Hypokalemia    Insomnia    Mitral regurgitation    Sleep apnea    Vitamin D deficiency    Wears dentures    full upper   Family History  Problem Relation Age of Onset   Throat cancer Father    Colon cancer Father    Heart disease Father    Cancer Father    Depression Father    Hypertension Father    Parkinson's disease Father    Arthritis Brother    Stroke Mother    Kidney disease Mother    Heart disease Mother    Kidney failure Mother    Breast cancer Paternal Aunt    Depression Sister    Stroke Maternal Grandmother    Bone cancer Brother    Stroke Brother    Prostate cancer Neg Hx    Kidney cancer Neg Hx    Bladder Cancer Neg Hx    Current Outpatient Medications  Medication Sig Dispense Refill   aspirin 81 MG tablet  Take 1 tablet by mouth daily.     atorvastatin (LIPITOR) 40 MG tablet TAKE 1 TABLET BY MOUTH  DAILY AT 6PM 90 tablet 1   Blood Glucose Monitoring Suppl (ONETOUCH VERIO FLEX SYSTEM) w/Device KIT USE UP TO 4 TIMES DAILY AS  DIRECTED 1 kit 0   cloNIDine (CATAPRES) 0.1 MG tablet Take 1 tablet (0.1 mg total) by mouth 2 (two) times daily. 180 tablet 1   dapagliflozin propanediol (FARXIGA) 10 MG TABS tablet Take 1 tablet (10 mg total) by mouth daily before breakfast. 90 tablet 1   Dulaglutide (TRULICITY) 0.17 PZ/0.2HE SOPN Inject 0.75 mg into the skin once a week. 6 mL 1   gabapentin (NEURONTIN) 400 MG capsule Take 3 capsules by mouth 3 times daily 540 capsule 1   Lancets (ONETOUCH DELICA PLUS NIDPOE42P) MISC USE AS DIRECTED TO CHECK  BLOOD GLUCOSE ONCE DAILY 100 each 3   metFORMIN  (GLUCOPHAGE) 1000 MG tablet Take 1 tablet (1,000 mg total) by mouth 2 (two) times daily with a meal. 180 tablet 4   omeprazole (PRILOSEC) 40 MG capsule Take 1 capsule (40 mg total) by mouth daily. 90 capsule 4   ONETOUCH VERIO test strip USE AS INSTRUCTED  TO TEST  BLOOD SUGAR TWO TIMES DAILY 200 strip 3   QUEtiapine (SEROQUEL) 200 MG tablet Take 1 tablet (200 mg total) by mouth at bedtime. 90 tablet 1   telmisartan (MICARDIS) 40 MG tablet Take 1 tablet (40 mg total) by mouth daily. 90 tablet 4   No current facility-administered medications for this visit.   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)  Tobacco Use   Smoking status: Former    Types: Cigarettes   Smokeless tobacco: Never   Tobacco comments:    former light smoker; quit in his teens  Vaping Use   Vaping Use: Never used  Substance and Sexual Activity   Alcohol use: No    Alcohol/week: 0.0 standard drinks of alcohol   Drug use: No   Sexual activity: Yes    Partners: Female  Other Topics Concern   Not on file  Social History Narrative   Not on file   Social Determinants of Health   Financial Resource Strain: Low Risk  (07/15/2021)   Overall Financial Resource Strain (CARDIA)    Difficulty of Paying Living Expenses: Not hard at all  Food Insecurity: No Food Insecurity (07/15/2021)   Hunger Vital Sign    Worried About Running Out of Food in the Last Year: Never true    [place] in the Last Year: Never true  Transportation Needs: No Transportation Needs (07/15/2021)   PRAPARE - Hydrologist (Medical): No    Lack of Transportation  (Non-Medical): No  Physical Activity: Inactive (07/15/2021)   Exercise Vital Sign    Days of Exercise per Week: 0 days    Minutes of Exercise per Session: 0 min  Stress: No Stress Concern Present (07/15/2021)   [place]    Feeling of Stress : Not at  all  Social Connections: Moderately Isolated (07/15/2021)   Social Connection and Isolation Panel [NHANES]    Frequency of Communication with Friends and Family: Twice a week    Frequency of Social Gatherings with Friends and Family: Once a week    Attends Religious Services: Never    Marine scientist or Organizations: No    Attends Archivist Meetings: Never    Marital Status: Married  Human resources officer Violence: Not At Risk (07/15/2021)   Humiliation, Afraid, Rape, and Kick questionnaire    Fear of Current or Ex-Partner: No    Emotionally Abused: No    Physically Abused: No    Sexually Abused: No     Preoperative Risk Factors  Major predictors that require intensive management and may lead to delay in or cancellation of the operative procedure unless emergent:  No Unstable coronary syndromes including unstable or severe angina or recent MI  No Decompensated heart failure including NYHA functional class 4 or worsening or new onset HF  No Significant arrhythmia including high grade AV block, symptomatic ventricular arrhythmias, supraventricular arrhythmias with a ventricular rate >100 bpm at rest, symptomatic bradycardia, and newly recognized ventricular tachycardia  yes Severe heart valve disease including severe aortic stenosis or symptomatic mitral stenosis  2. Additional independent predictors of major cardiac complications:  noHgh risk type of surgery (Vascular surgery and any open intraperitoneal or intrathoracic procedures)  Other clinical predictors that warrant careful assessment of current status  No History of ischemic heart disease No  History of CVA no  History of compensated heart failure or prior heart failure yes Diabetes mellitus on Insulin no Renal insufficiency  3. Perioperative cardiac and long term risk is increased in pts unable to meet a 4-MET demand during most normal daily activities:  Yes Ability to climb 2 flights of stairs or walk four blocks  Objective:  BP 136/82   Pulse 66   Temp 98.6 F (37 C) (Oral)   Wt 205 lb 12.8 oz (93.4 kg)   SpO2 98%   BMI 29.53 kg/m   Body mass index is 29.53 kg/m.     Juan Le 05/27/22  Relevant past medical, surgical, family and social history reviewed and updated as indicated. Interim medical history since our last visit reviewed. Allergies and medications reviewed and updated.  Review of Systems  Eyes:  Negative for visual disturbance.  Respiratory:  Negative for chest tightness and shortness of breath.   Cardiovascular:  Negative for chest pain, palpitations and leg swelling.  Endocrine: Negative for polydipsia and polyuria.  Neurological:  Negative for dizziness, light-headedness, numbness and headaches.    Per HPI unless specifically indicated above     Objective:    BP 136/82   Pulse 66   Temp 98.6 F (37 C) (Oral)   Wt 205 lb 12.8 oz (93.4 kg)   SpO2 98%   BMI 29.53 kg/m   Wt Readings from Last 3 Encounters:  05/27/22 205 lb 12.8 oz (93.4 kg)  03/01/22 209 lb 9.6 oz (95.1 kg)  12/01/21 207 lb 3.2 oz (94 kg)    Physical Exam Vitals and nursing note reviewed.  Constitutional:      General: He is not in acute distress.    Appearance: Normal appearance. He is not ill-appearing, toxic-appearing or diaphoretic.  HENT:     Head: Normocephalic.     Right Ear: Tympanic membrane, ear canal and external ear normal.     Left Ear: Tympanic membrane, ear canal and external  ear normal.     Nose: Nose normal. No congestion or rhinorrhea.     Mouth/Throat:     Mouth: Mucous membranes are moist.  Eyes:     General:        Right eye: No  discharge.        Left eye: No discharge.     Extraocular Movements: Extraocular movements intact.     Conjunctiva/sclera: Conjunctivae normal.     Pupils: Pupils are equal, round, and reactive to light.  Cardiovascular:     Rate and Rhythm: Normal rate and regular rhythm.     Heart sounds: No murmur heard. Pulmonary:     Effort: Pulmonary effort is normal. No respiratory distress.     Breath sounds: Normal breath sounds. No wheezing, rhonchi or rales.  Abdominal:     General: Abdomen is flat. Bowel sounds are normal. There is no distension.     Palpations: Abdomen is soft.     Tenderness: There is no abdominal tenderness. There is no guarding.  Musculoskeletal:     Cervical back: Normal range of motion and neck supple.  Skin:    General: Skin is warm and dry.     Capillary Refill: Capillary refill takes less than 2 seconds.  Neurological:     General: No focal deficit present.     Mental Status: He is alert and oriented to person, place, and time.     Cranial Nerves: No cranial nerve deficit.     Motor: No weakness.     Deep Tendon Reflexes: Reflexes normal.  Psychiatric:        Mood and Affect: Mood normal.        Behavior: Behavior normal.        Thought Content: Thought content normal.        Judgment: Judgment normal.     Results for orders placed or performed in visit on 03/01/22  Comp Met (CMET)  Result Value Ref Range   Glucose 100 (H) 70 - 99 mg/dL   BUN 15 8 - 27 mg/dL   Creatinine, Ser 4.44 0.76 - 1.27 mg/dL   eGFR 89 >85 PT/LCY/7.58   BUN/Creatinine Ratio 17 10 - 24   Sodium 142 134 - 144 mmol/L   Potassium 3.9 3.5 - 5.2 mmol/L   Chloride 104 96 - 106 mmol/L   CO2 18 (L) 20 - 29 mmol/L   Calcium 9.3 8.6 - 10.2 mg/dL   Total Protein 6.9 6.0 - 8.5 g/dL   Albumin 4.5 3.7 - 4.7 g/dL   Globulin, Total 2.4 1.5 - 4.5 g/dL   Albumin/Globulin Ratio 1.9 1.2 - 2.2   Bilirubin Total 0.3 0.0 - 1.2 mg/dL   Alkaline Phosphatase 62 44 - 121 IU/L   AST 26 0 - 40 IU/L    ALT 45 (H) 0 - 44 IU/L  HgB A1c  Result Value Ref Range   Hgb A1c MFr Bld 5.6 4.8 - 5.6 %   Est. average glucose Bld gHb Est-mCnc 114 mg/dL  CBC w/Diff  Result Value Ref Range   WBC 6.2 3.4 - 10.8 x10E3/uL   RBC 4.50 4.14 - 5.80 x10E6/uL   Hemoglobin 14.3 13.0 - 17.7 g/dL   Hematocrit 67.0 56.0 - 51.0 %   MCV 93 79 - 97 fL   MCH 31.8 26.6 - 33.0 pg   MCHC 34.2 31.5 - 35.7 g/dL   RDW 72.2 53.3 - 42.4 %   Platelets 138 (L) 150 - 450 x10E3/uL  Neutrophils 51 Not Estab. %   Lymphs 37 Not Estab. %   Monocytes 8 Not Estab. %   Eos 2 Not Estab. %   Basos 1 Not Estab. %   Neutrophils Absolute 3.2 1.4 - 7.0 x10E3/uL   Lymphocytes Absolute 2.3 0.7 - 3.1 x10E3/uL   Monocytes Absolute 0.5 0.1 - 0.9 x10E3/uL   EOS (ABSOLUTE) 0.1 0.0 - 0.4 x10E3/uL   Basophils Absolute 0.0 0.0 - 0.2 x10E3/uL   Immature Granulocytes 1 Not Estab. %   Immature Grans (Abs) 0.0 0.0 - 0.1 x10E3/uL  B12  Result Value Ref Range   Vitamin B-12 505 232 - 1,245 pg/mL      Assessment & Plan:   Problem List Items Addressed This Visit       Cardiovascular and Mediastinum   Hypertension associated with diabetes (HCC)    Chronic.  Controlled.  Continue with current medication regimen.  Not currently checking blood sugars. Continue Farxiga $RemoveBeforeDE'10mg'HIndquBnDJgQIQZ$  and Trulicity .$RemoveBefore'75mg'jwLOMtFNbTBrw$ .  Will Increase Trulicity of W3S is not at goal.  Patient receives Trulicity through patient assistance program.  Labs ordered today.  Return to clinic in 3 months for reevaluation.  Call sooner if concerns arise.        Atherosclerosis of coronary artery    Continue with ASA.  Labs ordered at visit today. Continue with Atorvastatin $RemoveBeforeDEI'40mg'etKaFXlvcsJxxjRN$  daily.  Refills sent today.  Follow up in 3 months.  Call sooner if concerns arise.        MI (mitral incompetence)    Last MI was in 2018.      Aortic atherosclerosis (HCC)    Continue with ASA.  Labs ordered at visit today. Continue with Atorvastatin $RemoveBeforeDEI'40mg'mnDJYQjHIhMHkUaZ$  daily.  Refills sent today.  Follow up in 3 months.  Call  sooner if concerns arise.          Respiratory   Obstructive sleep apnea    Chronic. Does not use CPAP.  Does not want to get a different machine or new sleep study. Will continue to reassess at future visits.      COPD with chronic bronchitis and emphysema (HCC)    Chronic.  Controlled.  Continue with current medication regimen.  Denies concerns about breathing today.  Denies concerns at visit today.  Labs ordered today.  Return to clinic in 3 months for reevaluation.  Call sooner if concerns arise.          Endocrine   Type 2 diabetes mellitus with peripheral neuropathy (HCC)    Chronic.  Controlled.  Continue with current medication regimen of Gabapentin.  Refill sent today.  Labs ordered today.  Return to clinic in 6 months for reevaluation.  Call sooner if concerns arise.        Hyperlipidemia associated with type 2 diabetes mellitus (HCC)    Chronic. Controlled.  Continue with current medication regimen of Atorvastatin $RemoveBeforeD'40mg'kuwICUgZYCdqxe$  daily. Labs ordered today.  Return in 3 months.  Call sooner if concerns arise.      Other Visit Diagnoses     Pre-op evaluation    -  Primary   Relevant Orders   TSH   Lipid panel   CBC with Differential/Platelet   Comprehensive metabolic panel   Urinalysis, Routine w reflex microscopic   HgB A1c   EKG 12-Lead (Completed)        Follow up plan: Return if symptoms worsen or fail to improve.

## 2022-05-27 NOTE — Assessment & Plan Note (Signed)
Continue with ASA.  Labs ordered at visit today. Continue with Atorvastatin '40mg'$  daily.  Refills sent today.  Follow up in 3 months.  Call sooner if concerns arise.

## 2022-05-27 NOTE — Assessment & Plan Note (Signed)
Chronic.  Controlled.  Continue with current medication regimen of Gabapentin.  Refill sent today.  Labs ordered today.  Return to clinic in 6 months for reevaluation.  Call sooner if concerns arise.

## 2022-05-27 NOTE — Assessment & Plan Note (Signed)
Chronic. Controlled.  Continue with current medication regimen of Atorvastatin '40mg'$  daily. Labs ordered today.  Return in 3 months.  Call sooner if concerns arise.

## 2022-05-27 NOTE — Progress Notes (Signed)
Results discussed with patient during visit.

## 2022-05-27 NOTE — Assessment & Plan Note (Signed)
Chronic. Does not use CPAP.  Does not want to get a different machine or new sleep study. Will continue to reassess at future visits.

## 2022-05-27 NOTE — Assessment & Plan Note (Signed)
Chronic.  Controlled.  Continue with current medication regimen.  Not currently checking blood sugars. Continue Farxiga '10mg'$  and Trulicity .'75mg'$ .  Will Increase Trulicity of A8T is not at goal.  Patient receives Trulicity through patient assistance program.  Labs ordered today.  Return to clinic in 3 months for reevaluation.  Call sooner if concerns arise.

## 2022-05-27 NOTE — Assessment & Plan Note (Signed)
Last MI was in 2018. 

## 2022-05-28 LAB — COMPREHENSIVE METABOLIC PANEL
ALT: 69 IU/L — ABNORMAL HIGH (ref 0–44)
AST: 89 IU/L — ABNORMAL HIGH (ref 0–40)
Albumin/Globulin Ratio: 1.4 (ref 1.2–2.2)
Albumin: 4.6 g/dL (ref 3.8–4.8)
Alkaline Phosphatase: 225 IU/L — ABNORMAL HIGH (ref 44–121)
BUN/Creatinine Ratio: 24 (ref 10–24)
BUN: 22 mg/dL (ref 8–27)
Bilirubin Total: 1 mg/dL (ref 0.0–1.2)
CO2: 21 mmol/L (ref 20–29)
Calcium: 9.6 mg/dL (ref 8.6–10.2)
Chloride: 94 mmol/L — ABNORMAL LOW (ref 96–106)
Creatinine, Ser: 0.91 mg/dL (ref 0.76–1.27)
Globulin, Total: 3.2 g/dL (ref 1.5–4.5)
Glucose: 294 mg/dL — ABNORMAL HIGH (ref 70–99)
Potassium: 4.8 mmol/L (ref 3.5–5.2)
Sodium: 136 mmol/L (ref 134–144)
Total Protein: 7.8 g/dL (ref 6.0–8.5)
eGFR: 87 mL/min/{1.73_m2} (ref >59)

## 2022-05-28 LAB — CBC WITH DIFFERENTIAL/PLATELET
Basophils Absolute: 0 10*3/uL (ref 0.0–0.2)
Basos: 0 %
EOS (ABSOLUTE): 0.1 10*3/uL (ref 0.0–0.4)
Eos: 2 %
Hematocrit: 42.7 % (ref 37.5–51.0)
Hemoglobin: 14.9 g/dL (ref 13.0–17.7)
Immature Grans (Abs): 0 10*3/uL (ref 0.0–0.1)
Immature Granulocytes: 0 %
Lymphocytes Absolute: 2.3 10*3/uL (ref 0.7–3.1)
Lymphs: 34 %
MCH: 32.1 pg (ref 26.6–33.0)
MCHC: 34.9 g/dL (ref 31.5–35.7)
MCV: 92 fL (ref 79–97)
Monocytes Absolute: 0.6 10*3/uL (ref 0.1–0.9)
Monocytes: 9 %
Neutrophils Absolute: 3.6 10*3/uL (ref 1.4–7.0)
Neutrophils: 55 %
Platelets: 189 10*3/uL (ref 150–450)
RBC: 4.64 x10E6/uL (ref 4.14–5.80)
RDW: 13.5 % (ref 11.6–15.4)
WBC: 6.7 10*3/uL (ref 3.4–10.8)

## 2022-05-28 LAB — HEMOGLOBIN A1C
Est. average glucose Bld gHb Est-mCnc: 111 mg/dL
Hgb A1c MFr Bld: 5.5 % (ref 4.8–5.6)

## 2022-05-28 LAB — LIPID PANEL
Chol/HDL Ratio: 2.3 ratio (ref 0.0–5.0)
Cholesterol, Total: 69 mg/dL — ABNORMAL LOW (ref 100–199)
HDL: 30 mg/dL — ABNORMAL LOW (ref >39)
LDL Chol Calc (NIH): 13 mg/dL (ref 0–99)
Triglycerides: 156 mg/dL — ABNORMAL HIGH (ref 0–149)
VLDL Cholesterol Cal: 26 mg/dL (ref 5–40)

## 2022-05-28 LAB — TSH: TSH: 8.24 u[IU]/mL — ABNORMAL HIGH (ref 0.450–4.500)

## 2022-05-28 NOTE — Progress Notes (Signed)
Please let patient know that his lab work looks good.  A1c remains well controlled at 5.5.  His Thyroid labs are abnormal.  When I see him in September we are going to do some additional blood work to evaluate this.  Otherwise, blood work looks good no concerns at this time.  I will fill out his clearance and get it sent over to [facility].

## 2022-05-31 ENCOUNTER — Ambulatory Visit (INDEPENDENT_AMBULATORY_CARE_PROVIDER_SITE_OTHER): Payer: Medicare Other

## 2022-05-31 NOTE — Progress Notes (Signed)
Chronic Care Management Pharmacy Note  05/31/2022 Name:  Juan Le. MRN:  [id number] DOB:  1945-11-26  Summary: Move to self-care  Subjective: Juan Le. is an 76 y.o. year old male who is a primary patient of Larae Grooms, NP.  The CCM team was consulted for assistance with disease management and care coordination needs.    Engaged with patient by telephone for follow up visit in response to provider referral for pharmacy case management and/or care coordination services.   Consent to Services:  The patient was given information about Chronic Care Management services, agreed to services, and gave verbal consent prior to initiation of services.  Please see initial visit note for detailed documentation.   Patient Care Team: Larae Grooms, NP as PCP - General Isla Pence, OD as Consulting Physician (Optometry) Marvel Plan, Elana Alm as Physician Assistant (Urology) Shane Crutch, MD as Consulting Physician (Pulmonary Disease) Andee Poles, RPH (Inactive) (Pharmacist) Zettie Pho, [facility] (Pharmacist)  Hospital visits:   Objective:  Lab Results  Component Value Date   CREATININE 0.91 05/27/2022   CREATININE 0.90 03/01/2022   CREATININE 0.93 12/01/2021    Lab Results  Component Value Date   HGBA1C 5.5 05/27/2022   HGBA1C 5.6 03/01/2022   HGBA1C 5.4 12/01/2021   Last diabetic Eye exam:  Lab Results  Component Value Date/Time   HMDIABEYEEXA No Retinopathy 02/04/2022 12:00 AM    Last diabetic Foot exam: No results found for: "HMDIABFOOTEX"      Component Value Date/Time   CHOL 69 (L) 05/27/2022 1008   CHOL 113 12/01/2021 0821   CHOL 128 08/31/2021 0905   TRIG 156 (H) 05/27/2022 1008   TRIG 235 (H) 12/01/2021 0821   TRIG 177 (H) 08/31/2021 0905   HDL 30 (L) 05/27/2022 1008   HDL 28 (L) 12/01/2021 0821   HDL 28 (L) 08/31/2021 0905   CHOLHDL 2.3 05/27/2022 1008   CHOLHDL 3.9 11/29/2017 0936   VLDL 42 (H)  04/21/2017 0852   LDLCALC 13 05/27/2022 1008   LDLCALC 48 12/01/2021 0821   LDLCALC 70 08/31/2021 0905   LDLCALC 68 11/29/2017 0936       Latest Ref Rng & Units 05/27/2022   10:08 AM 03/01/2022    9:33 AM 12/01/2021    8:21 AM  Hepatic Function  Total Protein 6.0 - 8.5 g/dL 7.8  6.9  7.3   Albumin 3.8 - 4.8 g/dL 4.6  4.5  4.9   AST 0 - 40 IU/L 89  26  24   ALT 0 - 44 IU/L 69  45  41   Alk Phosphatase 44 - 121 IU/L 225  62  79   Total Bilirubin 0.0 - 1.2 mg/dL 1.0  0.3  0.3     Lab Results  Component Value Date/Time   TSH 8.240 (H) 05/27/2022 10:08 AM   TSH 2.670 09/04/2020 09:23 AM       Latest Ref Rng & Units 05/27/2022   10:08 AM 03/01/2022    9:33 AM 03/04/2021    9:21 AM  CBC  WBC 3.4 - 10.8 x10E3/uL 6.7  6.2  7.1   Hemoglobin 13.0 - 17.7 g/dL 57.3  88.9  36.8   Hematocrit 37.5 - 51.0 % 42.7  41.8  41.1   Platelets 150 - 450 x10E3/uL 189  138  142     Lab Results  Component Value Date/Time   VD25OH 14 (L) 01/17/2017 10:00 AM   VD25OH 8 (L) 07/29/2016  08:57 AM    Clinical ASCVD:  The ASCVD Risk score (Arnett DK, et al., 2019) failed to calculate for the following reasons:   The patient has a prior MI or stroke diagnosis   Social History   Tobacco Use  Smoking Status Former   Types: Cigarettes  Smokeless Tobacco Never  Tobacco Comments   former light smoker; quit in his teens   BP Readings from Last 3 Encounters:  05/27/22 136/82  03/01/22 132/67  12/01/21 121/73   Pulse Readings from Last 3 Encounters:  05/27/22 66  03/01/22 (!) 56  12/01/21 87   Wt Readings from Last 3 Encounters:  05/27/22 205 lb 12.8 oz (93.4 kg)  03/01/22 209 lb 9.6 oz (95.1 kg)  12/01/21 207 lb 3.2 oz (94 kg)   BMI Readings from Last 3 Encounters:  05/27/22 29.53 kg/m  03/01/22 30.07 kg/m  12/01/21 29.73 kg/m    Assessment: Review of patient past medical history, allergies, medications, health status, including review of consultants reports, laboratory and other test  data, was performed as part of comprehensive evaluation and provision of chronic care management services.   SDOH:  (Social Determinants of Health) assessments and interventions performed: Yes SDOH Interventions    Flowsheet Row Most Recent Value  SDOH Interventions   Financial Strain Interventions Other (Comment)  [PAP]  Transportation Interventions Intervention Not Indicated       CCM Care Plan  Allergies  Allergen Reactions   Ace Inhibitors Cough   Diphenhydramine Rash    stomach upset    Medications Reviewed Today     Reviewed by [name], [facility] (Pharmacist) on 05/31/22 at [place] List Status: <None>   Medication Order Taking? Sig Documenting Provider Last Dose Status Informant  aspirin 81 MG tablet [id number] No Take 1 tablet by mouth daily. [provider] Taking Active            Med Note Gillie Manners Jun 12, 2018  8:09 AM)    atorvastatin (LIPITOR) 40 MG tablet [id number] No TAKE 1 TABLET BY MOUTH  DAILY AT Dol[place] Lory, Santiago Glad, NP Taking Active   Blood Glucose Monitoring Suppl (ONETOUCH VERIO FLEX SYSTEM) w/Device KIT [id number] No USE UP TO 4 TIMES DAILY AS  DIRECTED Cannady, Jolene T, NP Taking Active   cloNIDine (CATAPRES) 0.1 MG tablet [id number] No Take 1 tablet (0.1 mg total) by mouth 2 (two) times daily. Jon Billings, NP Taking Active   dapagliflozin propanediol (FARXIGA) 10 MG TABS tablet [id number] No Take 1 tablet (10 mg total) by mouth daily before breakfast. Jon Billings, NP Taking Active   Dulaglutide (TRULICITY) 0.53 ZJ/6.7HA SOPN [id number] No Inject 0.75 mg into the skin once a week. Jon Billings, NP Taking Active   gabapentin (NEURONTIN) 400 MG capsule [id number] No Take 3 capsules by mouth 3 times daily Jon Billings, NP Taking Active   Lancets (ONETOUCH DELICA PLUS EQASTM19Q) [place] [id number] No USE AS DIRECTED TO CHECK  BLOOD GLUCOSE ONCE DAILY Wynetta Emery, Megan P, DO Taking Active   metFORMIN (GLUCOPHAGE) 1000  MG tablet [id number] No Take 1 tablet (1,000 mg total) by mouth 2 (two) times daily with a meal. Jon Billings, NP Taking Active   omeprazole (PRILOSEC) 40 MG capsule [id number] No Take 1 capsule (40 mg total) by mouth daily. Jon Billings, NP Taking Active   [facility] VERIO test strip [id number] No USE AS INSTRUCTED  TO TEST  BLOOD SUGAR TWO TIMES DAILY Bacigalupo, Dionne Bucy, MD Taking Active  QUEtiapine (SEROQUEL) 200 MG tablet [id number] No Take 1 tablet (200 mg total) by mouth at bedtime. Jon Billings, NP Taking Active   telmisartan (MICARDIS) 40 MG tablet [id number] No Take 1 tablet (40 mg total) by mouth daily. Jon Billings, NP Taking Active             Patient Active Problem List   Diagnosis Date Noted   Vitamin B12 deficiency anemia 02/04/2021   Aortic atherosclerosis ([place]) 08/31/2020   COPD with chronic bronchitis and emphysema ([place]) 03/08/2019   Obesity 06/12/2018   History of acute myocardial infarction 03/10/2018   Obstructive sleep apnea 03/10/2018   Hx of colonic polyps    Benign neoplasm of ascending colon    Atherosclerosis of coronary artery 01/20/2017   DDD (degenerative disc disease), lumbar 01/20/2017   Cervical spinal stenosis 01/20/2017   MI (mitral incompetence) 01/20/2017   Chronic neck and back pain 08/13/2015   GERD (gastroesophageal reflux disease) 06/12/2015   Hypertension associated with diabetes ([place]) 06/12/2015   Type 2 diabetes mellitus with peripheral neuropathy ([place]) 04/16/2015   Hyperlipidemia associated with type 2 diabetes mellitus ([place]) 04/16/2015   Insomnia 04/16/2015   H/O coronary artery bypass surgery 04/16/2015   DDD (degenerative disc disease), cervical 04/11/2015   H/O adenomatous polyp of colon 06/10/2011    Immunization History  Administered Date(s) Administered   Fluad Quad(high Dose 65+) 06/11/2019, 08/10/2021   Influenza, High Dose Seasonal PF 07/17/2015, 07/29/2016, 07/15/2017   Influenza, Seasonal, Injecte,  Preservative Fre 08/14/2008   Influenza,inj,Quad PF,6+ Mos 07/20/2013, 06/20/2014   Influenza,inj,quad, With Preservative 08/18/2018   Influenza-Unspecified 08/18/2018, 08/07/2020   Moderna Sars-Covid-2 Vaccination 11/28/2019, 12/26/2019, 09/17/2020   Pneumococcal Conjugate-13 08/15/2014   Pneumococcal Polysaccharide-23 05/11/2012   Tdap 05/29/2014    Conditions to be addressed/monitored: HLD DMII HTN Obesity [place]ia OSA  Care Plan : CCM Pharmacy Care Plan  Updates made by [name], [place] since 05/31/2022 12:00 AM     Problem: DM2, HTN, CKD, Neuropathy, COPD, HLD, CAD, MI   Priority: High     Goal: Disease Management   Start Date: 11/23/2021  Expected End Date: 11/23/2022  Recent Progress: On track  Priority: High  Note:     Current Barriers:  Unable to independently afford treatment regimen Unable to independently monitor therapeutic efficacy  Pharmacist Clinical Goal(s):  Patient will verbalize ability to afford treatment regimen contact provider office for questions/concerns as evidenced notation of same in electronic health record through collaboration with PharmD and provider.   Interventions: 1:1 collaboration with Jon Billings, NP regarding development and update of comprehensive plan of care as evidenced by provider attestation and co-signature Inter-disciplinary care team collaboration (see longitudinal plan of care) Comprehensive medication review performed; medication list updated in electronic medical record  Hypertension (BP goal <130/80) -Controlled -Current treatment: Telmisartan 40 mg once daily Appropriate, Effective, Safe, Accessible Clonidine 0.1 mh twice daily Appropriate, Effective, Safe, Accessible -Current home readings: <130/80 -Current dietary habits: no changes -Current exercise habits: no changes -Denies hypotensive/hypertensive symptoms -Educated on BP goals and benefits of medications for prevention of heart attack, stroke and  kidney damage; -Counseled to monitor BP at home 1-2x/wk, document, and provide log at future appointments -Recommended to continue current medication  Hyperlipidemia: (LDL goal < 55) -Not ideally controlled -Current treatment: Atorvastatin 40 mg once daily Appropriate, Effective, Safe, Accessible -Reviewed tolerability - no problems noted  -Educated on Cholesterol goals;  Benefits of statin for ASCVD risk reduction; -Recommended to continue current medication Can consider up titration  atorvastatin 40->80 mg or additional of Zetia 10 mg to achieve LDL <55 and 50% reduction in baseline per ADA 2023  Diabetes (A1c goal <7%) Lab Results  Component Value Date   HGBA1C 5.5 05/27/2022   HGBA1C 5.6 03/01/2022   HGBA1C 5.4 12/01/2021   Lab Results  Component Value Date   MICROALBUR 30 (H) 12/05/2020   LDLCALC 13 05/27/2022   CREATININE 0.91 05/27/2022   Lab Results  Component Value Date   NA 136 05/27/2022   K 4.8 05/27/2022   CREATININE 0.91 05/27/2022   EGFR 87 05/27/2022   GFRNONAA 79 12/05/2020   GLUCOSE 294 (H) 05/27/2022   Lab Results  Component Value Date   WBC 6.7 05/27/2022   HGB 14.9 05/27/2022   HCT 42.7 05/27/2022   MCV 92 05/27/2022   PLT 189 05/27/2022   Lab Results  Component Value Date   LABMICR See below: 06/22/2018   LABMICR See below: 05/03/2018   MICROALBUR 30 (H) 12/05/2020   MICROALBUR 30 (H) 06/11/2020  -Controlled -Current medications: Farxiga 10 mg daily (PAP 2023) Appropriate, Effective, Safe, Accessible Trulicity 3.71 mg injection once weekly (PAP 2023)  Appropriate, Effective, Safe, Accessible Metformin 1000 mg twice daily with meal Appropriate, Effective, Safe, Accessible -Current home glucose readings fasting glucose: not testing post prandial glucose: checking 2 hours after lunch - about 150 -Denies hypoglycemic/hyperglycemic symptoms -Educated on A1c and blood sugar goals; -Counseled to check feet daily and get yearly eye  exams -Counseled on diet and exercise extensively August 2023: Could decrease Metformin because of how low his sugars are but patient states no hypoglycemia so will hold off -Redo PAP in November  Patient Goals/Self-Care Activities Patient will:  - take medications as prescribed as evidenced by patient report and record review collaborate with provider on medication access solutions target a minimum of 150 minutes of moderate intensity exercise weekly  CPP F/U PRN  [name], Pharm.D. - [phone]       Medication Assistance:  Will check on status of trulicity PAP - pt unsure if an order is scheduled.  Patient's preferred pharmacy is:  [facility], [place] [address] Phone: [phone] Fax: [phone]  [facility] ([place], [place] [facility] [address] [place] [address] [place] [address] Phone: [phone] Fax: [facility] ([facility]) - [place], [place] [address] Phone: [phone] Fax: [phone]   Pt endorses 100% compliance  Follow Up:  Patient agrees to Care Plan and Follow-up. Plan: CPP F/U PRN  Future Appointments  Date Time Provider [place]  06/25/2022  9:40 AM Jon Billings, NP [facility] [place], Pharm.D. - [phone]

## 2022-05-31 NOTE — Patient Instructions (Signed)
Visit Information   Goals Addressed   None    Patient Care Plan: CCM Pharmacy Care Plan     Problem Identified: DM2, HTN, CKD, Neuropathy, COPD, HLD, CAD, MI   Priority: High     Goal: Disease Management   Start Date: 11/23/2021  Expected End Date: 11/23/2022  Recent Progress: On track  Priority: High  Note:     Current Barriers:  Unable to independently afford treatment regimen Unable to independently monitor therapeutic efficacy  Pharmacist Clinical Goal(s):  Patient will verbalize ability to afford treatment regimen contact provider office for questions/concerns as evidenced notation of same in electronic health record through collaboration with PharmD and provider.   Interventions: 1:1 collaboration with Juan Billings, NP regarding development and update of comprehensive plan of care as evidenced by provider attestation and co-signature Inter-disciplinary care team collaboration (see longitudinal plan of care) Comprehensive medication review performed; medication list updated in electronic medical record  Hypertension (BP goal <130/80) -Controlled -Current treatment: Telmisartan 40 mg once daily Appropriate, Effective, Safe, Accessible Clonidine 0.1 mh twice daily Appropriate, Effective, Safe, Accessible -Current home readings: <130/80 -Current dietary habits: no changes -Current exercise habits: no changes -Denies hypotensive/hypertensive symptoms -Educated on BP goals and benefits of medications for prevention of heart attack, stroke and kidney damage; -Counseled to monitor BP at home 1-2x/wk, document, and provide log at future appointments -Recommended to continue current medication  Hyperlipidemia: (LDL goal < 55) -Not ideally controlled -Current treatment: Atorvastatin 40 mg once daily Appropriate, Effective, Safe, Accessible -Reviewed tolerability - no problems noted  -Educated on Cholesterol goals;  Benefits of statin for ASCVD risk  reduction; -Recommended to continue current medication Can consider up titration atorvastatin 40->80 mg or additional of Zetia 10 mg to achieve LDL <55 and 50% reduction in baseline per ADA 2023  Diabetes (A1c goal <7%) Lab Results  Component Value Date   HGBA1C 5.5 05/27/2022   HGBA1C 5.6 03/01/2022   HGBA1C 5.4 12/01/2021   Lab Results  Component Value Date   MICROALBUR 30 (H) 12/05/2020   LDLCALC 13 05/27/2022   CREATININE 0.91 05/27/2022   Lab Results  Component Value Date   NA 136 05/27/2022   K 4.8 05/27/2022   CREATININE 0.91 05/27/2022   EGFR 87 05/27/2022   GFRNONAA 79 12/05/2020   GLUCOSE 294 (H) 05/27/2022   Lab Results  Component Value Date   WBC 6.7 05/27/2022   HGB 14.9 05/27/2022   HCT 42.7 05/27/2022   MCV 92 05/27/2022   PLT 189 05/27/2022   Lab Results  Component Value Date   LABMICR See below: 06/22/2018   LABMICR See below: 05/03/2018   MICROALBUR 30 (H) 12/05/2020   MICROALBUR 30 (H) 06/11/2020  -Controlled -Current medications: Farxiga 10 mg daily (PAP 2023) Appropriate, Effective, Safe, Accessible Trulicity 0.27 mg injection once weekly (PAP 2023)  Appropriate, Effective, Safe, Accessible Metformin 1000 mg twice daily with meal Appropriate, Effective, Safe, Accessible -Current home glucose readings fasting glucose: not testing post prandial glucose: checking 2 hours after lunch - about 150 -Denies hypoglycemic/hyperglycemic symptoms -Educated on A1c and blood sugar goals; -Counseled to check feet daily and get yearly eye exams -Counseled on diet and exercise extensively August 2023: Could decrease Metformin because of how low his sugars are but patient states no hypoglycemia so will hold off -Redo PAP in November  Patient Goals/Self-Care Activities Patient will:  - take medications as prescribed as evidenced by patient report and record review collaborate with provider on medication access solutions target  a minimum of 150 minutes of  moderate intensity exercise weekly  CPP F/U PRN  Juan Le, Pharm.D. - [phone]      Juan Le was given information about Chronic Care Management services today including:  CCM service includes personalized support from designated clinical staff supervised by his physician, including individualized plan of care and coordination with other care providers 24/7 contact phone numbers for assistance for urgent and routine care needs. Standard insurance, coinsurance, copays and deductibles apply for chronic care management only during months in which we provide at least 20 minutes of these services. Most insurances cover these services at 100%, however patients may be responsible for any copay, coinsurance and/or deductible if applicable. This service may help you avoid the need for more expensive face-to-face services. Only one practitioner may furnish and bill the service in a calendar month. The patient may stop CCM services at any time (effective at the end of the month) by phone call to the office staff.  Patient agreed to services and verbal consent obtained.   The patient verbalized understanding of instructions, educational materials, and care plan provided today and DECLINED offer to receive copy of patient instructions, educational materials, and care plan.  CPP F/U PRN  Juan Le, [place]

## 2022-06-01 ENCOUNTER — Ambulatory Visit: Payer: Medicare Other | Admitting: Nurse Practitioner

## 2022-06-02 ENCOUNTER — Telehealth: Payer: Self-pay

## 2022-06-02 NOTE — Telephone Encounter (Signed)
Medical clearance has been received from patients PCP for scheduled colonoscopy 06/29/22.  Thanks, [place], [place]

## 2022-06-10 ENCOUNTER — Other Ambulatory Visit: Payer: Self-pay | Admitting: Nurse Practitioner

## 2022-06-10 DIAGNOSIS — E1142 Type 2 diabetes mellitus with diabetic polyneuropathy: Secondary | ICD-10-CM

## 2022-06-10 NOTE — Telephone Encounter (Signed)
Requested medication (s) are due for refill today: no  Requested medication (s) are on the active medication list: yes  Last refill:  03/01/22 #540 1 RF  Future visit scheduled: yes  Notes to clinic:  pharmacy requesting 1 year supply   Requested Prescriptions  Pending Prescriptions Disp Refills   gabapentin (NEURONTIN) 400 MG capsule [Pharmacy Med Name: Gabapentin 400 MG Oral Capsule] 180 capsule 17    Sig: Take 3 capsules by mouth 3 times daily     Neurology: Anticonvulsants - gabapentin Passed - 06/10/2022 12:05 AM      Passed - Cr in normal range and within 360 days    Creat  Date Value Ref Range Status  11/29/2017 1.03 0.70 - 1.18 mg/dL Final    Comment:    For patients >66 years of age, the reference limit for Creatinine is approximately 13% higher for people identified as African-American. .    Creatinine, Ser  Date Value Ref Range Status  05/27/2022 0.91 0.76 - 1.27 mg/dL Final   Creatinine, Urine  Date Value Ref Range Status  04/21/2017 206 20 - 370 mg/dL Final         Passed - Completed PHQ-2 or PHQ-9 in the last 360 days      Passed - Valid encounter within last 12 months    Recent Outpatient Visits           2 weeks ago Pre-op evaluation   [place], NP   3 months ago Hypertension associated with diabetes [facility])   [facility] Jon Billings, NP   6 months ago Hypertension associated with diabetes [facility])   [facility] Jon Billings, NP   9 months ago Hypertension associated with diabetes [facility])   [facility] Jon Billings, NP   1 year ago Hypertension associated with diabetes [facility])   [place], Karen, NP       Future Appointments             In 2 weeks Jon Billings, NP [facility], [place]

## 2022-06-17 DIAGNOSIS — E1159 Type 2 diabetes mellitus with other circulatory complications: Secondary | ICD-10-CM | POA: Diagnosis not present

## 2022-06-17 DIAGNOSIS — E785 Hyperlipidemia, unspecified: Secondary | ICD-10-CM | POA: Diagnosis not present

## 2022-06-17 DIAGNOSIS — I10 Essential (primary) hypertension: Secondary | ICD-10-CM

## 2022-06-17 DIAGNOSIS — Z794 Long term (current) use of insulin: Secondary | ICD-10-CM | POA: Diagnosis not present

## 2022-06-24 NOTE — Progress Notes (Signed)
BP 128/78   Pulse 60   Temp 98.2 F (36.8 C) (Oral)   Wt 203 lb 6.4 oz (92.3 kg)   SpO2 98%   BMI 29.18 kg/m    Subjective:    Patient ID: Juan Le., male    DOB: 1945/12/11, 76 y.o.   MRN: [id number]  HPI: Juan Le. is a 76 y.o. male  Chief Complaint  Patient presents with   Hypertension   Hyperlipidemia   Diabetes    3 month follow up    HYPERTENSION / HYPERLIPIDEMIA Doing well.  Denies concerns at visit today.  Satisfied with current treatment? yes Duration of hypertension: years BP monitoring frequency:no BP range:  BP medication side effects: no Past BP meds:  telmisartan and clonidine Duration of hyperlipidemia: years Cholesterol medication side effects: no Cholesterol supplements: none Past cholesterol medications: atorvastain (lipitor) Medication compliance: excellent compliance Aspirin: yes Recent stressors: no Recurrent headaches: no Visual changes: no Palpitations: no Dyspnea: with excretion Chest pain: no Lower extremity edema: no Dizzy/lightheaded: no  DIABETES Now taking [place] and Trulicity.  Denies concerns regarding medication at visit today.   Hypoglycemic episodes:no Polydipsia/polyuria: no Visual disturbance: no Chest pain: no Paresthesias: yes Glucose Monitoring: no  Accucheck frequency: hasn't been checking it  Fasting glucose:  Post prandial:  Evening:  Before meals: Taking Insulin?: no  Long acting insulin:  Short acting insulin: Blood Pressure Monitoring: sometimes Retinal Examination: Up to Date Foot Exam: Up to Date Diabetic Education: Not Completed Pneumovax: Up to Date Influenza: Up to Date Aspirin: yes  COPD COPD status: controlled Satisfied with current treatment?: yes Oxygen use: no Dyspnea frequency: when he is doing something physical Cough frequency:  Rescue inhaler frequency:  none Limitation of activity: yes Productive cough:  Last Spirometry: 03/04/2021 Pneumovax: Up to  Date Influenza: Up to Date  SLEEP APNEA Doesn't even have a CPAP machine.  Not interested in getting another machine or doing another sleep study. Sleep apnea status: uncontrolled Duration: chronic Satisfied with current treatment?:  no CPAP use:  no- didn't like it Sleep quality with CPAP use: good Treament compliance:poor compliance Last sleep study:  Treatments attempted: CPAP Wakes feeling refreshed:  yes Daytime hypersomnolence:  no Fatigue:  no Insomnia:  no Good sleep hygiene:  no Difficulty falling asleep:  no Difficulty staying asleep:  no Snoring bothers bed partner:   unknown Observed apnea by bed partner: no Obesity:  yes Hypertension: yes  Pulmonary hypertension:  no Coronary artery disease:  yes  NEUROPATHY Patient states his neuropathy is about the same.  Stats the pain is a burning in his feet.  Feels like the gabapentin helps with his pain.    Relevant past medical, surgical, family and social history reviewed and updated as indicated. Interim medical history since our last visit reviewed. Allergies and medications reviewed and updated.  Review of Systems  Eyes:  Negative for visual disturbance.  Respiratory:  Positive for shortness of breath. Negative for chest tightness.   Cardiovascular:  Negative for chest pain, palpitations and leg swelling.  Endocrine: Negative for polydipsia and polyuria.  Musculoskeletal:        Black toenail  Neurological:  Positive for numbness. Negative for dizziness, light-headedness and headaches.    Per HPI unless specifically indicated above     Objective:    BP 128/78   Pulse 60   Temp 98.2 F (36.8 C) (Oral)   Wt 203 lb 6.4 oz (92.3 kg)   SpO2 98%  BMI 29.18 kg/m   Wt Readings from Last 3 Encounters:  06/25/22 203 lb 6.4 oz (92.3 kg)  05/27/22 205 lb 12.8 oz (93.4 kg)  03/01/22 209 lb 9.6 oz (95.1 kg)    Physical Exam Vitals and nursing note reviewed.  Constitutional:      General: He is not in acute  distress.    Appearance: Normal appearance. He is obese. He is not ill-appearing, toxic-appearing or diaphoretic.  HENT:     Head: Normocephalic.     Right Ear: External ear normal.     Left Ear: External ear normal.     Nose: Nose normal. No congestion or rhinorrhea.     Mouth/Throat:     Mouth: Mucous membranes are moist.  Eyes:     General:        Right eye: No discharge.        Left eye: No discharge.     Extraocular Movements: Extraocular movements intact.     Conjunctiva/sclera: Conjunctivae normal.     Pupils: Pupils are equal, round, and reactive to light.  Cardiovascular:     Rate and Rhythm: Normal rate and regular rhythm.     Heart sounds: No murmur heard. Pulmonary:     Effort: Pulmonary effort is normal. No respiratory distress.     Breath sounds: Normal breath sounds. No wheezing, rhonchi or rales.  Abdominal:     General: Abdomen is flat. Bowel sounds are normal.  Musculoskeletal:     Cervical back: Normal range of motion and neck supple.  Feet:     Right foot:     Protective Sensation: 10 sites tested.  8 sites sensed.     Skin integrity: Callus and dry skin present. No skin breakdown, erythema or warmth.     Toenail Condition: Right toenails are abnormally thick and long. Fungal disease present.    Left foot:     Protective Sensation: 10 sites tested.  8 sites sensed.     Skin integrity: Callus and dry skin present. No skin breakdown, erythema or warmth.     Toenail Condition: Left toenails are abnormally thick and long.  Skin:    General: Skin is warm and dry.     Capillary Refill: Capillary refill takes less than 2 seconds.  Neurological:     General: No focal deficit present.     Mental Status: He is alert and oriented to person, place, and time.  Psychiatric:        Mood and Affect: Mood normal.        Behavior: Behavior normal.        Thought Content: Thought content normal.        Judgment: Judgment normal.     Results for orders placed or  performed in visit on 05/27/22  TSH  Result Value Ref Range   TSH 8.240 (H) 0.450 - 4.500 uIU/mL  Lipid panel  Result Value Ref Range   Cholesterol, Total 69 (L) 100 - 199 mg/dL   Triglycerides 087 (H) 0 - 149 mg/dL   HDL 30 (L) >43 mg/dL   VLDL Cholesterol Cal 26 5 - 40 mg/dL   LDL Chol Calc (NIH) 13 0 - 99 mg/dL   Chol/HDL Ratio 2.3 0.0 - 5.0 ratio  CBC with Differential/Platelet  Result Value Ref Range   WBC 6.7 3.4 - 10.8 x10E3/uL   RBC 4.64 4.14 - 5.80 x10E6/uL   Hemoglobin 14.9 13.0 - 17.7 g/dL   Hematocrit 10.0 04.3 - 51.0 %  MCV 92 79 - 97 fL   MCH 32.1 26.6 - 33.0 pg   MCHC 34.9 31.5 - 35.7 g/dL   RDW 13.5 11.6 - 15.4 %   Platelets 189 150 - 450 x10E3/uL   Neutrophils 55 Not Estab. %   Lymphs 34 Not Estab. %   Monocytes 9 Not Estab. %   Eos 2 Not Estab. %   Basos 0 Not Estab. %   Neutrophils Absolute 3.6 1.4 - 7.0 x10E3/uL   Lymphocytes Absolute 2.3 0.7 - 3.1 x10E3/uL   Monocytes Absolute 0.6 0.1 - 0.9 x10E3/uL   EOS (ABSOLUTE) 0.1 0.0 - 0.4 x10E3/uL   Basophils Absolute 0.0 0.0 - 0.2 x10E3/uL   Immature Granulocytes 0 Not Estab. %   Immature Grans (Abs) 0.0 0.0 - 0.1 x10E3/uL  Comprehensive metabolic panel  Result Value Ref Range   Glucose 294 (H) 70 - 99 mg/dL   BUN 22 8 - 27 mg/dL   Creatinine, Ser 0.91 0.76 - 1.27 mg/dL   eGFR 87 >59 mL/min/1.73   BUN/Creatinine Ratio 24 10 - 24   Sodium 136 134 - 144 mmol/L   Potassium 4.8 3.5 - 5.2 mmol/L   Chloride 94 (L) 96 - 106 mmol/L   CO2 21 20 - 29 mmol/L   Calcium 9.6 8.6 - 10.2 mg/dL   Total Protein 7.8 6.0 - 8.5 g/dL   Albumin 4.6 3.8 - 4.8 g/dL   Globulin, Total 3.2 1.5 - 4.5 g/dL   Albumin/Globulin Ratio 1.4 1.2 - 2.2   Bilirubin Total 1.0 0.0 - 1.2 mg/dL   Alkaline Phosphatase 225 (H) 44 - 121 IU/L   AST 89 (H) 0 - 40 IU/L   ALT 69 (H) 0 - 44 IU/L  Urinalysis, Routine w reflex microscopic  Result Value Ref Range   Specific Gravity, UA 1.020 1.005 - 1.030   pH, UA 5.5 5.0 - 7.5   Color, UA Yellow  Yellow   Appearance Ur Clear Clear   Leukocytes,UA Negative Negative   Protein,UA Negative Negative/Trace   Glucose, UA 3+ (A) Negative   Ketones, UA Trace (A) Negative   RBC, UA Negative Negative   Bilirubin, UA Negative Negative   Urobilinogen, Ur 0.2 0.2 - 1.0 mg/dL   Nitrite, UA Negative Negative  HgB A1c  Result Value Ref Range   Hgb A1c MFr Bld 5.5 4.8 - 5.6 %   Est. average glucose Bld gHb Est-mCnc 111 mg/dL      Assessment & Plan:   Problem List Items Addressed This Visit       Cardiovascular and Mediastinum   Hypertension associated with diabetes ([place]) - Primary    Chronic.  Controlled.  Continue with current medication regimen.  Not currently checking blood sugars. Continue Farxiga $RemoveBeforeDE'10mg'cPweJySITefsAsI$  and Trulicity .$RemoveBefore'75mg'zRQbVuvRsKIDy$ .  Will Increase Trulicity of F1M is not at goal.  Patient receives Trulicity through patient assistance program.  Last A1c was 5.5. Return to clinic in 3 months for reevaluation.  Call sooner if concerns arise.        Aortic atherosclerosis (HCC)    Continue with ASA.  Continue with Atorvastatin $RemoveBeforeDEI'40mg'nxIZgEtGtsvyeupl$  daily.  Refills sent today.  Follow up in 3 months.  Call sooner if concerns arise.          Respiratory   COPD with chronic bronchitis and emphysema (HCC)    Chronic.  Controlled.  Continue with current medication regimen.  Denies concerns about breathing today.  Denies concerns at visit today.  Return to clinic in 3  months for reevaluation.  Call sooner if concerns arise.          Endocrine   Type 2 diabetes mellitus with peripheral neuropathy (HCC)    Chronic.  Controlled.  Continue with current medication regimen of Gabapentin.  Refill sent today.   Return to clinic in 3 months for reevaluation.  Call sooner if concerns arise.        Hyperlipidemia associated with type 2 diabetes mellitus (HCC)    Chronic. Controlled.  Continue with current medication regimen of Atorvastatin $RemoveBeforeD'40mg'FoqHTCcjnGZtfq$  daily.  Return in 3 months.  Call sooner if concerns arise.        Follow  up plan: Return in about 3 months (around 09/24/2022) for HTN, HLD, DM2 FU.

## 2022-06-25 ENCOUNTER — Ambulatory Visit (INDEPENDENT_AMBULATORY_CARE_PROVIDER_SITE_OTHER): Payer: Medicare Other | Admitting: Nurse Practitioner

## 2022-06-25 ENCOUNTER — Telehealth: Payer: Self-pay

## 2022-06-25 ENCOUNTER — Encounter: Payer: Self-pay | Admitting: Nurse Practitioner

## 2022-06-25 VITALS — BP 128/78 | HR 60 | Temp 98.2°F | Wt 203.4 lb

## 2022-06-25 DIAGNOSIS — E1159 Type 2 diabetes mellitus with other circulatory complications: Secondary | ICD-10-CM | POA: Diagnosis not present

## 2022-06-25 DIAGNOSIS — E1142 Type 2 diabetes mellitus with diabetic polyneuropathy: Secondary | ICD-10-CM | POA: Diagnosis not present

## 2022-06-25 DIAGNOSIS — D519 Vitamin B12 deficiency anemia, unspecified: Secondary | ICD-10-CM

## 2022-06-25 DIAGNOSIS — E1169 Type 2 diabetes mellitus with other specified complication: Secondary | ICD-10-CM

## 2022-06-25 DIAGNOSIS — E785 Hyperlipidemia, unspecified: Secondary | ICD-10-CM | POA: Diagnosis not present

## 2022-06-25 DIAGNOSIS — J449 Chronic obstructive pulmonary disease, unspecified: Secondary | ICD-10-CM | POA: Diagnosis not present

## 2022-06-25 DIAGNOSIS — I700 Atherosclerosis of aorta: Secondary | ICD-10-CM

## 2022-06-25 DIAGNOSIS — I152 Hypertension secondary to endocrine disorders: Secondary | ICD-10-CM | POA: Diagnosis not present

## 2022-06-25 NOTE — Assessment & Plan Note (Signed)
Chronic.  Controlled.  Continue with current medication regimen.  Denies concerns about breathing today.  Denies concerns at visit today.  Return to clinic in 3 months for reevaluation.  Call sooner if concerns arise.

## 2022-06-25 NOTE — Telephone Encounter (Signed)
Called and LVM asking for patient to please return my call to schedule AWV with CMA's.

## 2022-06-25 NOTE — Assessment & Plan Note (Signed)
Continue with ASA.  Continue with Atorvastatin 40mg daily.  Refills sent today.  Follow up in 3 months.  Call sooner if concerns arise.   

## 2022-06-25 NOTE — Assessment & Plan Note (Signed)
Chronic.  Controlled.  Continue with current medication regimen of Gabapentin.  Refill sent today.   Return to clinic in 3 months for reevaluation.  Call sooner if concerns arise.

## 2022-06-25 NOTE — Assessment & Plan Note (Signed)
Chronic.  Controlled.  Continue with current medication regimen.  Not currently checking blood sugars. Continue Farxiga '10mg'$  and Trulicity .'75mg'$ .  Will Increase Trulicity of K7Q is not at goal.  Patient receives Trulicity through patient assistance program.  Last A1c was 5.5. Return to clinic in 3 months for reevaluation.  Call sooner if concerns arise.

## 2022-06-25 NOTE — Assessment & Plan Note (Signed)
Chronic. Controlled.  Continue with current medication regimen of Atorvastatin '40mg'$  daily.  Return in 3 months.  Call sooner if concerns arise.

## 2022-06-28 NOTE — Anesthesia Preprocedure Evaluation (Signed)
Anesthesia Evaluation  Patient identified by MRN, date of birth, ID band Patient awake    Reviewed: Allergy & Precautions, NPO status , Patient's Chart, lab work & pertinent test results  Airway Mallampati: III  TM Distance: >3 FB Neck ROM: full    Dental  (+) Chipped, Upper Dentures   Pulmonary sleep apnea , COPD, former smoker,    Pulmonary exam normal        Cardiovascular Exercise Tolerance: Good hypertension, Pt. on medications + CAD, + Past MI, + Cardiac Stents and + CABG  Normal cardiovascular exam+ Valvular Problems/Murmurs MR      Neuro/Psych negative psych ROS   GI/Hepatic negative GI ROS, Neg liver ROS,   Endo/Other  diabetes, Well Controlled, Type 2  Renal/GU negative Renal ROS  negative genitourinary   Musculoskeletal   Abdominal Normal abdominal exam  (+)   Peds  Hematology negative hematology ROS (+)   Anesthesia Other Findings Past Medical History: No date: Anxiety 03/25/2017: Atelectasis of left lung No date: Chronic, continuous use of opioids No date: COPD (chronic obstructive pulmonary disease) (HCC) No date: Degenerative disc disease, lumbar No date: Diabetes ([place]) No date: DJD (degenerative joint disease) No date: GERD (gastroesophageal reflux disease) No date: Heart attack ([place]) No date: History of diverticulitis No date: Hypertension No date: Hypertension No date: Hypertriglyceridemia No date: Hypokalemia No date: Insomnia No date: Mitral regurgitation No date: Sleep apnea No date: Vitamin D deficiency No date: Wears dentures     Comment:  full upper  Past Surgical History: 01/28/2017: COLONOSCOPY WITH PROPOFOL; N/A     Comment:  Procedure: COLONOSCOPY WITH PROPOFOL;  Surgeon: [name], MD;  Location: [place];  Service:               Endoscopy;  Laterality: N/A;  diabetic - oral meds 1997: CORONARY ARTERY BYPASS GRAFT 11/2006: CORONARY STENT  PLACEMENT 2003: FOOT SURGERY No date: HERNIA REPAIR 01/28/2017: POLYPECTOMY; N/A     Comment:  Procedure: POLYPECTOMY;  Surgeon: [name], MD;                Location: [place];  Service: Endoscopy;                Laterality: N/A; 33/0076: UMBILICAL HERNIA REPAIR     Reproductive/Obstetrics negative OB ROS                           Anesthesia Physical Anesthesia Plan  ASA: 3  Anesthesia Plan: General   Post-op Pain Management: Minimal or no pain anticipated   Induction: Intravenous  PONV Risk Score and Plan: Propofol infusion and TIVA  Airway Management Planned: Natural Airway  Additional Equipment:   Intra-op Plan:   Post-operative Plan:   Informed Consent: I have reviewed the patients History and Physical, chart, labs and discussed the procedure including the risks, benefits and alternatives for the proposed anesthesia with the patient or authorized representative who has indicated his/her understanding and acceptance.     Dental Advisory Given  Plan Discussed with: Anesthesiologist, CRNA and Surgeon  Anesthesia Plan Comments:        Anesthesia Quick Evaluation

## 2022-06-29 ENCOUNTER — Ambulatory Visit: Payer: Medicare Other | Admitting: Anesthesiology

## 2022-06-29 ENCOUNTER — Other Ambulatory Visit: Payer: Self-pay

## 2022-06-29 ENCOUNTER — Encounter
Admission: RE | Disposition: A | Discharge status: Home / Self Care | Payer: Self-pay | Source: Home / Self Care | Attending: Gastroenterology

## 2022-06-29 ENCOUNTER — Ambulatory Visit
Admission: RE | Admit: 2022-06-29 | Discharge: 2022-06-29 | Disposition: A | Discharge status: Home / Self Care | Payer: Medicare Other | Attending: Gastroenterology | Admitting: Gastroenterology

## 2022-06-29 ENCOUNTER — Encounter: Payer: Self-pay | Admitting: Gastroenterology

## 2022-06-29 DIAGNOSIS — I10 Essential (primary) hypertension: Secondary | ICD-10-CM | POA: Diagnosis not present

## 2022-06-29 DIAGNOSIS — Z955 Presence of coronary angioplasty implant and graft: Secondary | ICD-10-CM | POA: Diagnosis not present

## 2022-06-29 DIAGNOSIS — Z87891 Personal history of nicotine dependence: Secondary | ICD-10-CM | POA: Diagnosis not present

## 2022-06-29 DIAGNOSIS — Z7984 Long term (current) use of oral hypoglycemic drugs: Secondary | ICD-10-CM | POA: Insufficient documentation

## 2022-06-29 DIAGNOSIS — G4730 Sleep apnea, unspecified: Secondary | ICD-10-CM | POA: Insufficient documentation

## 2022-06-29 DIAGNOSIS — K635 Polyp of colon: Secondary | ICD-10-CM

## 2022-06-29 DIAGNOSIS — K5730 Diverticulosis of large intestine without perforation or abscess without bleeding: Secondary | ICD-10-CM | POA: Insufficient documentation

## 2022-06-29 DIAGNOSIS — Z951 Presence of aortocoronary bypass graft: Secondary | ICD-10-CM | POA: Diagnosis not present

## 2022-06-29 DIAGNOSIS — Z86010 Personal history of colonic polyps: Secondary | ICD-10-CM | POA: Insufficient documentation

## 2022-06-29 DIAGNOSIS — I2510 Atherosclerotic heart disease of native coronary artery without angina pectoris: Secondary | ICD-10-CM | POA: Insufficient documentation

## 2022-06-29 DIAGNOSIS — J449 Chronic obstructive pulmonary disease, unspecified: Secondary | ICD-10-CM | POA: Insufficient documentation

## 2022-06-29 DIAGNOSIS — K219 Gastro-esophageal reflux disease without esophagitis: Secondary | ICD-10-CM | POA: Diagnosis not present

## 2022-06-29 DIAGNOSIS — I252 Old myocardial infarction: Secondary | ICD-10-CM | POA: Diagnosis not present

## 2022-06-29 DIAGNOSIS — K640 First degree hemorrhoids: Secondary | ICD-10-CM | POA: Insufficient documentation

## 2022-06-29 DIAGNOSIS — E781 Pure hyperglyceridemia: Secondary | ICD-10-CM | POA: Diagnosis not present

## 2022-06-29 DIAGNOSIS — D123 Benign neoplasm of transverse colon: Secondary | ICD-10-CM | POA: Insufficient documentation

## 2022-06-29 DIAGNOSIS — Z1211 Encounter for screening for malignant neoplasm of colon: Secondary | ICD-10-CM | POA: Insufficient documentation

## 2022-06-29 DIAGNOSIS — E119 Type 2 diabetes mellitus without complications: Secondary | ICD-10-CM | POA: Diagnosis not present

## 2022-06-29 DIAGNOSIS — K648 Other hemorrhoids: Secondary | ICD-10-CM | POA: Diagnosis not present

## 2022-06-29 HISTORY — PX: COLONOSCOPY WITH PROPOFOL: SHX5780

## 2022-06-29 LAB — GLUCOSE, CAPILLARY: Glucose-Capillary: 141 mg/dL — ABNORMAL HIGH (ref 70–99)

## 2022-06-29 SURGERY — COLONOSCOPY WITH PROPOFOL
Anesthesia: General

## 2022-06-29 MED ORDER — PROPOFOL 500 MG/50ML IV EMUL
INTRAVENOUS | Status: DC | PRN
  Administered 2022-06-29: 175 ug/kg/min via INTRAVENOUS

## 2022-06-29 MED ORDER — DEXMEDETOMIDINE HCL 200 MCG/2ML IV SOLN
INTRAVENOUS | Status: DC | PRN
  Administered 2022-06-29: 8 ug via INTRAVENOUS

## 2022-06-29 MED ORDER — PHENYLEPHRINE HCL (PRESSORS) 10 MG/ML IV SOLN
INTRAVENOUS | Status: DC | PRN
  Administered 2022-06-29: 160 ug via INTRAVENOUS
  Administered 2022-06-29: 80 ug via INTRAVENOUS

## 2022-06-29 MED ORDER — SODIUM CHLORIDE 0.9 % IV SOLN: INTRAVENOUS | Status: DC

## 2022-06-29 MED ORDER — LIDOCAINE HCL (PF) 2 % IJ SOLN
INTRAMUSCULAR | Status: AC
  Filled 2022-06-29: qty 30

## 2022-06-29 MED ORDER — LIDOCAINE HCL (CARDIAC) PF 100 MG/5ML IV SOSY
PREFILLED_SYRINGE | INTRAVENOUS | Status: DC | PRN
  Administered 2022-06-29: 100 mg via INTRAVENOUS

## 2022-06-29 MED ORDER — PROPOFOL 1000 MG/100ML IV EMUL
INTRAVENOUS | Status: AC
  Filled 2022-06-29: qty 100

## 2022-06-29 MED ORDER — PROPOFOL 1000 MG/100ML IV EMUL
INTRAVENOUS | Status: AC
  Filled 2022-06-29: qty 200

## 2022-06-29 MED ORDER — PROPOFOL 10 MG/ML IV BOLUS
INTRAVENOUS | Status: DC | PRN
  Administered 2022-06-29: 90 mg via INTRAVENOUS

## 2022-06-29 MED ORDER — EPHEDRINE SULFATE (PRESSORS) 50 MG/ML IJ SOLN
INTRAMUSCULAR | Status: DC | PRN
  Administered 2022-06-29: 10 mg via INTRAVENOUS
  Administered 2022-06-29 (×3): 5 mg via INTRAVENOUS

## 2022-06-29 MED ORDER — DEXMEDETOMIDINE HCL IN NACL 80 MCG/20ML IV SOLN
INTRAVENOUS | Status: AC
  Filled 2022-06-29: qty 20

## 2022-06-29 NOTE — H&P (Signed)
[name], MD [facility] [address], [place], [place] [address] [phone] Fax : [phone]  Primary Care Physician:  Jon Billings, NP Primary Gastroenterologist:  Dr. Allen Norris  Pre-Procedure History & Physical: HPI:  Juan Gist. is a 76 y.o. male is here for an colonoscopy.   Past Medical History:  Diagnosis Date   Anxiety    Atelectasis of left lung 03/25/2017   Chronic, continuous use of opioids    COPD (chronic obstructive pulmonary disease) (HCC)    Degenerative disc disease, lumbar    Diabetes (HCC)    DJD (degenerative joint disease)    GERD (gastroesophageal reflux disease)    Heart attack ([place])    History of diverticulitis    Hypertension    Hypertension    Hypertriglyceridemia    Hypokalemia    Insomnia    Mitral regurgitation    Sleep apnea    Vitamin D deficiency    Wears dentures    full upper    Past Surgical History:  Procedure Laterality Date   COLONOSCOPY WITH PROPOFOL N/A 01/28/2017   Procedure: COLONOSCOPY WITH PROPOFOL;  Surgeon: [name], MD;  Location: [place];  Service: Endoscopy;  Laterality: N/A;  diabetic - oral meds   CORONARY ARTERY BYPASS GRAFT  1997   CORONARY STENT PLACEMENT  11/2006   FOOT SURGERY  2003   HERNIA REPAIR     POLYPECTOMY N/A 01/28/2017   Procedure: POLYPECTOMY;  Surgeon: [name], MD;  Location: [place];  Service: Endoscopy;  Laterality: N/A;   UMBILICAL HERNIA REPAIR  04/2012    Prior to Admission medications   Medication Sig Start Date End Date Taking? Authorizing Provider  aspirin 81 MG tablet Take 1 tablet by mouth daily.   Yes [provider]  atorvastatin (LIPITOR) 40 MG tablet TAKE 1 TABLET BY MOUTH  DAILY AT 6PM 03/01/22  Yes Jon Billings, NP  cloNIDine (CATAPRES) 0.1 MG tablet Take 1 tablet (0.1 mg total) by mouth 2 (two) times daily. 03/01/22  Yes Jon Billings, NP  dapagliflozin propanediol (FARXIGA) 10 MG TABS tablet Take 1 tablet (10  mg total) by mouth daily before breakfast. 03/01/22  Yes Jon Billings, NP  gabapentin (NEURONTIN) 400 MG capsule Take 3 capsules by mouth 3 times daily 03/01/22  Yes Jon Billings, NP  metFORMIN (GLUCOPHAGE) 1000 MG tablet Take 1 tablet (1,000 mg total) by mouth 2 (two) times daily with a meal. 12/01/21  Yes Jon Billings, NP  omeprazole (PRILOSEC) 40 MG capsule Take 1 capsule (40 mg total) by mouth daily. 12/01/21  Yes Jon Billings, NP  QUEtiapine (SEROQUEL) 200 MG tablet Take 1 tablet (200 mg total) by mouth at bedtime. 03/01/22  Yes Jon Billings, NP  telmisartan (MICARDIS) 40 MG tablet Take 1 tablet (40 mg total) by mouth daily. 12/01/21  Yes Jon Billings, NP  Blood Glucose Monitoring Suppl (ONETOUCH VERIO FLEX SYSTEM) w/Device KIT USE UP TO 4 TIMES DAILY AS  DIRECTED 10/27/20   Cannady, Jolene T, NP  Dulaglutide (TRULICITY) 0.94 MH/6.8GS SOPN Inject 0.75 mg into the skin once a week. 03/09/21   Jon Billings, NP  Lancets [facility] DELICA PLUS [id number]) MISC USE AS DIRECTED TO CHECK  BLOOD GLUCOSE ONCE DAILY 09/22/20   Wynetta Emery, Megan P, DO  ONETOUCH VERIO test strip USE AS INSTRUCTED  TO TEST  BLOOD SUGAR TWO TIMES DAILY 01/26/20   [name], MD    Allergies as of 05/19/2022 - Review Complete 03/01/2022  Allergen Reaction  Noted   Ace inhibitors Cough 04/11/2015   Diphenhydramine Rash 04/11/2015    Family History  Problem Relation Age of Onset   Throat cancer Father    Colon cancer Father    Heart disease Father    Cancer Father    Depression Father    Hypertension Father    Parkinson's disease Father    Arthritis Brother    Stroke Mother    Kidney disease Mother    Heart disease Mother    Kidney failure Mother    Breast cancer Paternal Aunt    Depression Sister    Stroke Maternal Grandmother    Bone cancer Brother    Stroke Brother    Prostate cancer Neg Hx    Kidney cancer Neg Hx    Bladder Cancer Neg Hx     Social History    Socioeconomic History   Marital status: Married    Spouse name: Not on file   Number of children: 2   Years of education: 12   Highest education level: High school graduate  Occupational History   Occupation: Retired    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)  Tobacco Use   Smoking status: Former    Types: Cigarettes   Smokeless tobacco: Never   Tobacco comments:    former light smoker; quit in his teens  Vaping Use   Vaping Use: Never used  Substance and Sexual Activity   Alcohol use: No    Alcohol/week: 0.0 standard drinks of alcohol   Drug use: No   Sexual activity: Yes    Partners: Female  Other Topics Concern   Not on file  Social History Narrative   Not on file   Social Determinants of Health   Financial Resource Strain: High Risk (05/31/2022)   Overall Financial Resource Strain (CARDIA)    Difficulty of Paying Living Expenses: Hard  Food Insecurity: No Food Insecurity (07/15/2021)   Hunger Vital Sign    Worried About Running Out of Food in the Last Year: Never true    Ran Out of Food in the Last Year: Never true  Transportation Needs: No Transportation Needs (05/31/2022)   PRAPARE - Hydrologist (Medical): No    Lack of Transportation (Non-Medical): No  Physical Activity: Inactive (07/15/2021)   Exercise Vital Sign    Days of Exercise per Week: 0 days    Minutes of Exercise per Session: 0 min  Stress: No Stress Concern Present (07/15/2021)   [place]    Feeling of Stress : Not at all  Social Connections: Moderately Isolated (07/15/2021)   Social Connection and Isolation Panel [NHANES]    Frequency of Communication with Friends and Family: Twice a week    Frequency of Social Gatherings with Friends and Family: Once a week    Attends Religious Services: Never    Marine scientist or Organizations: No    Attends Archivist Meetings:  Never    Marital Status: Married  Human resources officer Violence: Not At Risk (07/15/2021)   Humiliation, Afraid, Rape, and Kick questionnaire    Fear of Current or Ex-Partner: No    Emotionally Abused: No    Physically Abused: No    Sexually Abused: No    Review of Systems: See HPI, otherwise negative ROS  Physical Exam: BP 107/75   Pulse 82   Temp 97.8 F (36.6 C) (Temporal)   Resp 16  Ht '5\' 10"'$  (1.778 m)   SpO2 100%   BMI 29.18 kg/m  General:   Alert,  pleasant and cooperative in NAD Head:  Normocephalic and atraumatic. Neck:  Supple; no masses or thyromegaly. Lungs:  Clear throughout to auscultation.    Heart:  Regular rate and rhythm. Abdomen:  Soft, nontender and nondistended. Normal bowel sounds, without guarding, and without rebound.   Neurologic:  Alert and  oriented x4;  grossly normal neurologically.  Impression/Plan: Juan Gist. is here for an colonoscopy to be performed for a history of adenomatous polyps on 2018   Risks, benefits, limitations, and alternatives regarding  colonoscopy have been reviewed with the patient.  Questions have been answered.  All parties agreeable.   [name], MD  06/29/2022, 8:15 AM

## 2022-06-29 NOTE — Anesthesia Postprocedure Evaluation (Signed)
Anesthesia Post Note  Patient: Juan Le.  Procedure(s) Performed: COLONOSCOPY WITH PROPOFOL  Patient location during evaluation: Endoscopy Anesthesia Type: General Level of consciousness: awake and alert Pain management: pain level controlled Vital Signs Assessment: post-procedure vital signs reviewed and stable Respiratory status: spontaneous breathing, nonlabored ventilation and respiratory function stable Cardiovascular status: blood pressure returned to baseline and stable Postop Assessment: no apparent nausea or vomiting Anesthetic complications: no   No notable events documented.   Last Vitals:  Vitals:   06/29/22 0912 06/29/22 0919  BP: 90/64 109/63  Pulse:  68  Resp:  17  Temp:    SpO2:  98%    Last Pain:  Vitals:   06/29/22 0919  TempSrc:   PainSc: 0-No pain                 [place] Ouch

## 2022-06-29 NOTE — Transfer of Care (Signed)
Immediate Anesthesia Transfer of Care Note  Patient: [name]  Procedure(s) Performed: COLONOSCOPY WITH PROPOFOL  Patient Location: Endoscopy Unit  Anesthesia Type:General  Level of Consciousness: drowsy  Airway & Oxygen Therapy: Patient Spontanous Breathing  Post-op Assessment: Report given to RN and Post -op Vital signs reviewed and stable  Post vital signs: Reviewed and stable  Last Vitals:  Vitals Value Taken Time  BP 88/59 06/29/22 0850  Temp    Pulse 64 06/29/22 0850  Resp 14 06/29/22 0850  SpO2 100 % 06/29/22 0850    Last Pain:  Vitals:   06/29/22 0850  TempSrc:   PainSc: Asleep         Complications: No notable events documented.

## 2022-06-29 NOTE — Op Note (Signed)
[facility] Patient Name: Juan Le Procedure Date: 06/29/2022 8:19 AM MRN: [id number] Account #: [id number] Date of Birth: 1946/07/02 Admit Type: Outpatient Age: 76 Room: [facility] ENDO ROOM 4 Gender: Male Note Status: Finalized Instrument Name: Jasper Riling [id number] Procedure:             Colonoscopy Indications:           High risk colon cancer surveillance: Personal history                         of colonic polyps Providers:             [name] MD, MD Referring MD:          Jon Billings (Referring MD) Medicines:             Propofol per Anesthesia Complications:         No immediate complications. Procedure:             Pre-Anesthesia Assessment:                        - Prior to the procedure, a History and Physical was                         performed, and patient medications and allergies were                         reviewed. The patient's tolerance of previous                         anesthesia was also reviewed. The risks and benefits                         of the procedure and the sedation options and risks                         were discussed with the patient. All questions were                         answered, and informed consent was obtained. Prior                         Anticoagulants: The patient has taken no previous                         anticoagulant or antiplatelet agents. ASA Grade                         Assessment: II - A patient with mild systemic disease.                         After reviewing the risks and benefits, the patient                         was deemed in satisfactory condition to undergo the                         procedure.  After obtaining informed consent, the colonoscope was                         passed under direct vision. Throughout the procedure,                         the patient's blood pressure, pulse, and oxygen                         saturations were  monitored continuously. The                         Colonoscope was introduced through the anus and                         advanced to the the cecum, identified by appendiceal                         orifice and ileocecal valve. The colonoscopy was                         performed without difficulty. The patient tolerated                         the procedure well. The quality of the bowel                         preparation was good. Findings:      The perianal and digital rectal examinations were normal.      A 3 mm polyp was found in the transverse colon. The polyp was sessile.       The polyp was removed with a cold biopsy forceps. Resection and       retrieval were complete.      A few small-mouthed diverticula were found in the entire colon.      Non-bleeding internal hemorrhoids were found during retroflexion. The       hemorrhoids were Grade I (internal hemorrhoids that do not prolapse). Impression:            - One 3 mm polyp in the transverse colon, removed with                         a cold biopsy forceps. Resected and retrieved.                        - Diverticulosis in the entire examined colon.                        - Non-bleeding internal hemorrhoids. Recommendation:        - Discharge patient to home.                        - Resume previous diet.                        - Continue present medications.                        - Await pathology results.                        -  Repeat colonoscopy is not recommended for                         surveillance. Procedure Code(s):     --- Professional ---                        [id number], Colonoscopy, flexible; with biopsy, single or                         multiple Diagnosis Code(s):     --- Professional ---                        Z86.010, Personal history of colonic polyps                        K63.5, Polyp of colon CPT copyright 2019 American Medical Association. All rights reserved. The codes documented in this report are  preliminary and upon coder review may  be revised to meet current compliance requirements. [name] MD, MD 06/29/2022 8:47:40 AM This report has been signed electronically. Number of Addenda: 0 Note Initiated On: 06/29/2022 8:19 AM Scope Withdrawal Time: 0 hours 10 minutes 31 seconds  Total Procedure Duration: 0 hours 16 minutes 12 seconds  Estimated Blood Loss:  Estimated blood loss: none.      [facility]

## 2022-06-30 ENCOUNTER — Encounter: Payer: Self-pay | Admitting: Gastroenterology

## 2022-06-30 LAB — SURGICAL PATHOLOGY

## 2022-07-01 ENCOUNTER — Encounter: Payer: Self-pay | Admitting: Gastroenterology

## 2022-07-01 ENCOUNTER — Telehealth: Payer: Self-pay | Admitting: *Deleted

## 2022-07-01 NOTE — Telephone Encounter (Signed)
Pt states returning call from Monday regarding lab work, on VM.  Noted result note reviewed with pt, 05/29/11, reviewed again. No additional questions.

## 2022-07-20 ENCOUNTER — Other Ambulatory Visit: Payer: Self-pay | Admitting: Nurse Practitioner

## 2022-07-20 DIAGNOSIS — E1169 Type 2 diabetes mellitus with other specified complication: Secondary | ICD-10-CM

## 2022-07-21 ENCOUNTER — Other Ambulatory Visit: Payer: Self-pay | Admitting: Nurse Practitioner

## 2022-07-21 ENCOUNTER — Ambulatory Visit (INDEPENDENT_AMBULATORY_CARE_PROVIDER_SITE_OTHER): Payer: Medicare Other | Admitting: *Deleted

## 2022-07-21 DIAGNOSIS — I152 Hypertension secondary to endocrine disorders: Secondary | ICD-10-CM

## 2022-07-21 DIAGNOSIS — Z0000 Encounter for general adult medical examination without abnormal findings: Secondary | ICD-10-CM | POA: Diagnosis not present

## 2022-07-21 NOTE — Patient Instructions (Signed)
Juan Le , Thank you for taking time to come for your Medicare Wellness Visit. I appreciate your ongoing commitment to your health goals. Please review the following plan we discussed and let me know if I can assist you in the future.   Screening recommendations/referrals: Colonoscopy: up to date Recommended yearly ophthalmology/optometry visit for glaucoma screening and checkup Recommended yearly dental visit for hygiene and checkup  Vaccinations: Influenza vaccine: up to date Pneumococcal vaccine: up to date Tdap vaccine: up to date Shingles vaccine: Education provided    Advanced directives: not on file  Conditions/risks identified:   Next appointment: 09-24-2022 @ 9:20  [place]  Preventive Care 35 Years and Older, Male Preventive care refers to lifestyle choices and visits with your health care provider that can promote health and wellness. What does preventive care include? A yearly physical exam. This is also called an annual well check. Dental exams once or twice a year. Routine eye exams. Ask your health care provider how often you should have your eyes checked. Personal lifestyle choices, including: Daily care of your teeth and gums. Regular physical activity. Eating a healthy diet. Avoiding tobacco and drug use. Limiting alcohol use. Practicing safe sex. Taking low doses of aspirin every day. Taking vitamin and mineral supplements as recommended by your health care provider. What happens during an annual well check? The services and screenings done by your health care provider during your annual well check will depend on your age, overall health, lifestyle risk factors, and family history of disease. Counseling  Your health care provider may ask you questions about your: Alcohol use. Tobacco use. Drug use. Emotional well-being. Home and relationship well-being. Sexual activity. Eating habits. History of falls. Memory and ability to understand  (cognition). Work and work Statistician. Screening  You may have the following tests or measurements: Height, weight, and BMI. Blood pressure. Lipid and cholesterol levels. These may be checked every 5 years, or more frequently if you are over 2 years old. Skin check. Lung cancer screening. You may have this screening every year starting at age 74 if you have a 30-pack-year history of smoking and currently smoke or have quit within the past 15 years. Fecal occult blood test (FOBT) of the stool. You may have this test every year starting at age 64. Flexible sigmoidoscopy or colonoscopy. You may have a sigmoidoscopy every 5 years or a colonoscopy every 10 years starting at age 56. Prostate cancer screening. Recommendations will vary depending on your family history and other risks. Hepatitis C blood test. Hepatitis B blood test. Sexually transmitted disease (STD) testing. Diabetes screening. This is done by checking your blood sugar (glucose) after you have not eaten for a while (fasting). You may have this done every 1-3 years. Abdominal aortic aneurysm (AAA) screening. You may need this if you are a current or former smoker. Osteoporosis. You may be screened starting at age 19 if you are at high risk. Talk with your health care provider about your test results, treatment options, and if necessary, the need for more tests. Vaccines  Your health care provider may recommend certain vaccines, such as: Influenza vaccine. This is recommended every year. Tetanus, diphtheria, and acellular pertussis (Tdap, Td) vaccine. You may need a Td booster every 10 years. Zoster vaccine. You may need this after age 35. Pneumococcal 13-valent conjugate (PCV13) vaccine. One dose is recommended after age 70. Pneumococcal polysaccharide (PPSV23) vaccine. One dose is recommended after age 91. Talk to your health care provider about which screenings  and vaccines you need and how often you need them. This  information is not intended to replace advice given to you by your health care provider. Make sure you discuss any questions you have with your health care provider. Document Released: 10/31/2015 Document Revised: 06/23/2016 Document Reviewed: 08/05/2015 Elsevier Interactive Patient Education  2017 [place] Prevention in the Home Falls can cause injuries. They can happen to people of all ages. There are many things you can do to make your home safe and to help prevent falls. What can I do on the outside of my home? Regularly fix the edges of walkways and driveways and fix any cracks. Remove anything that might make you trip as you walk through a door, such as a raised step or threshold. Trim any bushes or trees on the path to your home. Use bright outdoor lighting. Clear any walking paths of anything that might make someone trip, such as rocks or tools. Regularly check to see if handrails are loose or broken. Make sure that both sides of any steps have handrails. Any raised decks and porches should have guardrails on the edges. Have any leaves, snow, or ice cleared regularly. Use sand or salt on walking paths during winter. Clean up any spills in your garage right away. This includes oil or grease spills. What can I do in the bathroom? Use night lights. Install grab bars by the toilet and in the tub and shower. Do not use towel bars as grab bars. Use non-skid mats or decals in the tub or shower. If you need to sit down in the shower, use a plastic, non-slip stool. Keep the floor dry. Clean up any water that spills on the floor as soon as it happens. Remove soap buildup in the tub or shower regularly. Attach bath mats securely with double-sided non-slip rug tape. Do not have throw rugs and other things on the floor that can make you trip. What can I do in the bedroom? Use night lights. Make sure that you have a light by your bed that is easy to reach. Do not use any sheets or  blankets that are too big for your bed. They should not hang down onto the floor. Have a firm chair that has side arms. You can use this for support while you get dressed. Do not have throw rugs and other things on the floor that can make you trip. What can I do in the kitchen? Clean up any spills right away. Avoid walking on wet floors. Keep items that you use a lot in easy-to-reach places. If you need to reach something above you, use a strong step stool that has a grab bar. Keep electrical cords out of the way. Do not use floor polish or wax that makes floors slippery. If you must use wax, use non-skid floor wax. Do not have throw rugs and other things on the floor that can make you trip. What can I do with my stairs? Do not leave any items on the stairs. Make sure that there are handrails on both sides of the stairs and use them. Fix handrails that are broken or loose. Make sure that handrails are as long as the stairways. Check any carpeting to make sure that it is firmly attached to the stairs. Fix any carpet that is loose or worn. Avoid having throw rugs at the top or bottom of the stairs. If you do have throw rugs, attach them to the floor with carpet tape.  Make sure that you have a light switch at the top of the stairs and the bottom of the stairs. If you do not have them, ask someone to add them for you. What else can I do to help prevent falls? Wear shoes that: Do not have high heels. Have rubber bottoms. Are comfortable and fit you well. Are closed at the toe. Do not wear sandals. If you use a stepladder: Make sure that it is fully opened. Do not climb a closed stepladder. Make sure that both sides of the stepladder are locked into place. Ask someone to hold it for you, if possible. Clearly mark and make sure that you can see: Any grab bars or handrails. First and last steps. Where the edge of each step is. Use tools that help you move around (mobility aids) if they are  needed. These include: Canes. Walkers. Scooters. Crutches. Turn on the lights when you go into a dark area. Replace any light bulbs as soon as they burn out. Set up your furniture so you have a clear path. Avoid moving your furniture around. If any of your floors are uneven, fix them. If there are any pets around you, be aware of where they are. Review your medicines with your doctor. Some medicines can make you feel dizzy. This can increase your chance of falling. Ask your doctor what other things that you can do to help prevent falls. This information is not intended to replace advice given to you by your health care provider. Make sure you discuss any questions you have with your health care provider. Document Released: 07/31/2009 Document Revised: 03/11/2016 Document Reviewed: 11/08/2014 Elsevier Interactive Patient Education  2017 [employer].

## 2022-07-21 NOTE — Telephone Encounter (Signed)
Requested Prescriptions  Pending Prescriptions Disp Refills  . cloNIDine (CATAPRES) 0.1 MG tablet [Pharmacy Med Name: cloNIDine HCl 0.1 MG Oral Tablet] 180 tablet 0    Sig: TAKE 1 TABLET BY MOUTH TWICE  DAILY     Cardiovascular:  Alpha-2 Agonists Passed - 07/21/2022  7:42 AM      Passed - Last BP in normal range    BP Readings from Last 1 Encounters:  06/29/22 109/63         Passed - Last Heart Rate in normal range    Pulse Readings from Last 1 Encounters:  06/29/22 68         Passed - Valid encounter within last 6 months    Recent Outpatient Visits          3 weeks ago Hypertension associated with diabetes ([place])   [facility] Jon Billings, NP   1 month ago Pre-op evaluation   [place], Santiago Glad, NP   4 months ago Hypertension associated with diabetes [facility])   [facility] Jon Billings, NP   7 months ago Hypertension associated with diabetes [facility])   [facility] Jon Billings, NP   10 months ago Hypertension associated with diabetes [facility])   [place], Karen, NP      Future Appointments            In 2 months Jon Billings, NP [facility], [place]

## 2022-07-21 NOTE — Telephone Encounter (Signed)
Requested Prescriptions  Pending Prescriptions Disp Refills  . atorvastatin (LIPITOR) 40 MG tablet [Pharmacy Med Name: Atorvastatin Calcium 40 MG Oral Tablet] 90 tablet 0    Sig: TAKE 1 TABLET BY MOUTH DAILY AT  6PM     Cardiovascular:  Antilipid - Statins Failed - 07/20/2022 11:13 PM      Failed - Lipid Panel in normal range within the last 12 months    Cholesterol, Total  Date Value Ref Range Status  05/27/2022 69 (L) 100 - 199 mg/dL Final   LDL Cholesterol (Calc)  Date Value Ref Range Status  11/29/2017 68 mg/dL (calc) Final    Comment:    Reference range: <100 . Desirable range <100 mg/dL for primary prevention;   <70 mg/dL for patients with CHD or diabetic patients  with > or = 2 CHD risk factors. Marland Kitchen LDL-C is now calculated using the Martin-Hopkins  calculation, which is a validated novel method providing  better accuracy than the Friedewald equation in the  estimation of LDL-C.  Cresenciano Genre et al. Annamaria Helling. 9476;546(50): 2061-2068  (http://education.[employer].com/faq/FAQ164)    LDL Chol Calc (NIH)  Date Value Ref Range Status  05/27/2022 13 0 - 99 mg/dL Final   HDL  Date Value Ref Range Status  05/27/2022 30 (L) >39 mg/dL Final   Triglycerides  Date Value Ref Range Status  05/27/2022 156 (H) 0 - 149 mg/dL Final         Passed - Patient is not pregnant      Passed - Valid encounter within last 12 months    Recent Outpatient Visits          3 weeks ago Hypertension associated with diabetes ([place])   [place], Karen, NP   1 month ago Pre-op evaluation   [place], Santiago Glad, NP   4 months ago Hypertension associated with diabetes [facility])   [facility] Jon Billings, NP   7 months ago Hypertension associated with diabetes [facility])   [facility] Jon Billings, NP   10 months ago Hypertension associated with diabetes [facility])   [place] Jon Billings, NP      Future  Appointments            Today  [facility], [place]   In 2 months Jon Billings, NP [facility], [place]

## 2022-07-21 NOTE — Progress Notes (Signed)
Subjective:   Juan Gist. is a 76 y.o. male who presents for Medicare Annual/Subsequent preventive examination.  I connected with  Juan Gist. on 07/21/22 by a telephone enabled telemedicine application and verified that I am speaking with the correct person using two identifiers.   I discussed the limitations of evaluation and management by telemedicine. The patient expressed understanding and agreed to proceed.  Patient location: home  Provider location: Tele-health-home   Review of Systems     Cardiac Risk Factors include: advanced age (>34mn, >>73women);diabetes mellitus;male gender;family history of premature cardiovascular disease;obesity (BMI >30kg/m2);sedentary lifestyle     Objective:    Today's Vitals   There is no height or weight on file to calculate BMI.     07/21/2022    9:35 AM 06/29/2022    7:54 AM 07/15/2021    5:49 PM 12/17/2019    8:52 AM 12/13/2018    9:37 AM 01/24/2018    1:40 PM 08/11/2017    9:06 AM  Advanced Directives  Does Patient Have a Medical Advance Directive? _0  Yes Yes  Type of Advance Directive Living [place] will Living will;Healthcare Power of [place] will  Does patient want to make changes to medical advance directive?   No - Patient declined      Copy of HGalvestonin Chart? No - copy requested No - copy requested No - copy requested No - copy requested No - copy requested No - copy requested     Current Medications (verified) Outpatient Encounter Medications as of 07/21/2022  Medication Sig   aspirin 81 MG tablet Take 1 tablet by mouth daily.   atorvastatin (LIPITOR) 40 MG tablet TAKE 1 TABLET BY MOUTH DAILY AT  6PM   Blood Glucose Monitoring Suppl (ONETOUCH VERIO FLEX SYSTEM) w/Device KIT USE UP  TO 4 TIMES DAILY AS  DIRECTED   dapagliflozin propanediol (FARXIGA) 10 MG TABS tablet Take 1 tablet (10 mg total) by mouth daily before breakfast.   Dulaglutide (TRULICITY) 00.86MVH/8.4ONSOPN Inject 0.75 mg into the skin once a week.   gabapentin (NEURONTIN) 400 MG capsule Take 3 capsules by mouth 3 times daily   Lancets (ONETOUCH DELICA PLUS [id number] MISC USE AS DIRECTED TO CHECK  BLOOD GLUCOSE ONCE DAILY   metFORMIN (GLUCOPHAGE) 1000 MG tablet Take 1 tablet (1,000 mg total) by mouth 2 (two) times daily with a meal.   omeprazole (PRILOSEC) 40 MG capsule Take 1 capsule (40 mg total) by mouth daily.   ONETOUCH VERIO test strip USE AS INSTRUCTED  TO TEST  BLOOD SUGAR TWO TIMES DAILY   QUEtiapine (SEROQUEL) 200 MG tablet Take 1 tablet (200 mg total) by mouth at bedtime.   telmisartan (MICARDIS) 40 MG tablet Take 1 tablet (40 mg total) by mouth daily.   [DISCONTINUED] cloNIDine (CATAPRES) 0.1 MG tablet Take 1 tablet (0.1 mg total) by mouth 2 (two) times daily.   [DISCONTINUED] atorvastatin (LIPITOR) 40 MG tablet TAKE 1 TABLET BY MOUTH  DAILY AT 6PM   No facility-administered encounter medications on file as of 07/21/2022.    Allergies (verified) Ace inhibitors and Diphenhydramine   History: Past Medical History:  Diagnosis Date   Anxiety    Atelectasis of left lung 03/25/2017   Chronic, continuous use of opioids    COPD (chronic obstructive pulmonary disease) (  [place])    Degenerative disc disease, lumbar    Diabetes ([place])    DJD (degenerative joint disease)    GERD (gastroesophageal reflux disease)    Heart attack ([place])    History of diverticulitis    Hypertension    Hypertension    Hypertriglyceridemia    Hypokalemia    Insomnia    Mitral regurgitation    Sleep apnea    Vitamin D deficiency    Wears dentures    full upper   Past Surgical History:  Procedure Laterality Date   COLONOSCOPY WITH PROPOFOL N/A 01/28/2017   Procedure: COLONOSCOPY WITH PROPOFOL;  Surgeon: [name],  MD;  Location: [place];  Service: Endoscopy;  Laterality: N/A;  diabetic - oral meds   COLONOSCOPY WITH PROPOFOL N/A 06/29/2022   Procedure: COLONOSCOPY WITH PROPOFOL;  Surgeon: [name], MD;  Location[facility] ENDOSCOPY;  Service: Endoscopy;  Laterality: N/A;   CORONARY ARTERY BYPASS GRAFT  1997   CORONARY STENT PLACEMENT  11/2006   FOOT SURGERY  2003   HERNIA REPAIR     POLYPECTOMY N/A 01/28/2017   Procedure: POLYPECTOMY;  Surgeon: [name], MD;  Location: [place];  Service: Endoscopy;  Laterality: N/A;   UMBILICAL HERNIA REPAIR  04/2012   Family History  Problem Relation Age of Onset   Throat cancer Father    Colon cancer Father    Heart disease Father    Cancer Father    Depression Father    Hypertension Father    Parkinson's disease Father    Arthritis Brother    Stroke Mother    Kidney disease Mother    Heart disease Mother    Kidney failure Mother    Breast cancer Paternal Aunt    Depression Sister    Stroke Maternal Grandmother    Bone cancer Brother    Stroke Brother    Prostate cancer Neg Hx    Kidney cancer Neg Hx    Bladder Cancer Neg Hx    Social History   Socioeconomic History   Marital status: Married    Spouse name: Not on file   Number of children: 2   Years of education: 12   Highest education level: High school graduate  Occupational History   Occupation: Retired    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)  Tobacco Use   Smoking status: Former    Types: Cigarettes   Smokeless tobacco: Never   Tobacco comments:    former light smoker; quit in his teens  Vaping Use   Vaping Use: Never used  Substance and Sexual Activity   Alcohol use: No    Alcohol/week: 0.0 standard drinks of alcohol   Drug use: No   Sexual activity: Yes    Partners: Female  Other Topics Concern   Not on file  Social History Narrative   Not on file   Social Determinants of Health   Financial Resource Strain: Low Risk   (07/21/2022)   Overall Financial Resource Strain (CARDIA)    Difficulty of Paying Living Expenses: Not very hard  Recent Concern: Emergency planning/management officer Strain - High Risk (05/31/2022)   Overall Financial Resource Strain (CARDIA)    Difficulty of Paying Living Expenses: Hard  Food Insecurity: No Food Insecurity (07/21/2022)   Hunger Vital Sign    Worried About Running Out of Food in the Last Year: Never true    Ran Out of Food in the Last Year: Never true  Transportation Needs: No Transportation  Needs (07/21/2022)   PRAPARE - Hydrologist (Medical): No    Lack of Transportation (Non-Medical): No  Physical Activity: Inactive (07/21/2022)   Exercise Vital Sign    Days of Exercise per Week: 0 days    Minutes of Exercise per Session: 0 min  Stress: No Stress Concern Present (07/21/2022)   [place]    Feeling of Stress : Not at all  Social Connections: Socially Isolated (07/21/2022)   Social Connection and Isolation Panel [NHANES]    Frequency of Communication with Friends and Family: Twice a week    Frequency of Social Gatherings with Friends and Family: Never    Attends Religious Services: Never    Marine scientist or Organizations: No    Attends Music therapist: Never    Marital Status: Married    Tobacco Counseling Counseling given: Not Answered Tobacco comments: former light smoker; quit in his teens   Clinical Intake:  Pre-visit preparation completed: Yes  Pain : No/denies pain     Diabetes: Yes CBG done?: No Did pt. bring in CBG monitor from home?: No  How often do you need to have someone help you when you read instructions, pamphlets, or other written materials from your doctor or pharmacy?: 1 - Never  Diabetic?  Yes  Nutrition Risk Assessment:  Has the patient had any N/V/D within the last 2 months?  No  Does the patient have any non-healing wounds?  No   Has the patient had any unintentional weight loss or weight gain?  No   Diabetes:  Is the patient diabetic?  Yes  If diabetic, was a CBG obtained today?  No  Did the patient bring in their glucometer from home?  No  How often do you monitor your CBG's? Does not check.   Financial Strains and Diabetes Management:  Are you having any financial strains with the device, your supplies or your medication? No .  Does the patient want to be seen by Chronic Care Management for management of their diabetes?  No  Would the patient like to be referred to a Nutritionist or for Diabetic Management?  No   Diabetic Exams:  Diabetic Eye Exam: . Pt has been advised about the importance in completing this exam. A referral has been placed today.  Diabetic Foot Exam:  Pt has been advised about the importance in completing this exam. .    Interpreter Needed?: No  Information entered by :: Leroy Kennedy LPN   Activities of Daily Living    07/21/2022    9:47 AM  In your present state of health, do you have any difficulty performing the following activities:  Hearing? 0  Vision? 0  Difficulty concentrating or making decisions? 0  Walking or climbing stairs? 0  Dressing or bathing? 0  Doing errands, shopping? 0  Preparing Food and eating ? N  Using the Toilet? N  In the past six months, have you accidently leaked urine? N  Do you have problems with loss of bowel control? N  Managing your Medications? N  Managing your Finances? N  Housekeeping or managing your Housekeeping? N    Patient Care Team: Jon Billings, NP as PCP - General Anell Barr, OD as Consulting Physician (Optometry) Ernestine Conrad, [facility] [employer] as Physician Assistant (Urology) Laverle Hobby, MD as Consulting Physician (Pulmonary Disease) Cathi Roan, [facility] (Inactive) (Pharmacist) [name], [facility] (Pharmacist)  Indicate any  recent Medical Services you may have received from other than [facility] providers in  the past year (date may be approximate).     Assessment:   This is a routine wellness examination for [employer].  Hearing/Vision screen Hearing Screening - Comments:: No trouble hearing Vision Screening - Comments:: Up to date [facility]  Dietary issues and exercise activities discussed: Current Exercise Habits: The patient does not participate in regular exercise at present   Goals Addressed             This Visit's Progress    Weight (lb) < 200 lb (90.7 kg)         Depression Screen    07/21/2022    9:41 AM 06/25/2022    9:25 AM 05/27/2022    9:34 AM 03/01/2022    9:08 AM 12/01/2021    8:09 AM 08/31/2021    8:51 AM 07/15/2021    5:47 PM  PHQ 2/9 Scores  PHQ - 2 Score 0 0 0 0 0 0 0  PHQ- 9 Score 3 0 0 2 3 0     Fall Risk    07/21/2022    9:39 AM 06/25/2022    9:25 AM 05/27/2022    9:33 AM 03/01/2022    9:07 AM 12/01/2021    8:09 AM  Fall Risk   Falls in the past year? 0 0 0 0 0  Number falls in past yr: 0 0 0 0 0  Injury with Fall? 0 0 0 0 0  Risk for fall due to : Impaired balance/gait No Fall Risks No Fall Risks No Fall Risks No Fall Risks  Follow up Falls evaluation completed;Education provided;Falls prevention discussed Falls evaluation completed Falls evaluation completed Falls evaluation completed Falls evaluation completed    FALL RISK PREVENTION PERTAINING TO THE HOME:  Any stairs in or around the home? No  If so, are there any without handrails? No  Home free of loose throw rugs in walkways, pet beds, electrical cords, etc? Yes  Adequate lighting in your home to reduce risk of falls? Yes   ASSISTIVE DEVICES UTILIZED TO PREVENT FALLS:  Life alert? No  Use of a cane, walker or w/c? No  Grab bars in the bathroom? No  Shower chair or bench in shower? No  Elevated toilet seat or a handicapped toilet? No   TIMED UP AND GO:  Was the test performed? No .    Cognitive Function:        07/21/2022    9:37 AM 07/15/2021    5:51 PM 12/13/2018    9:44 AM  01/24/2018    1:41 PM 01/17/2017    9:02 AM  6CIT Screen  What Year? 0 points 0 points 0 points 0 points 0 points  What month? 0 points 0 points 0 points 0 points 0 points  What time? 0 points 0 points 0 points 0 points 0 points  Count back from 20 0 points 0 points 0 points 0 points 0 points  Months in reverse 4 points 0 points 0 points 0 points 4 points  Repeat phrase 0 points 0 points 0 points 0 points 2 points  Total Score 4 points 0 points 0 points 0 points 6 points    Immunizations Immunization History  Administered Date(s) Administered   Fluad Quad(high Dose 65+) 06/11/2019, 08/10/2021, 07/14/2022   Influenza, High Dose Seasonal PF 07/17/2015, 07/29/2016, 07/15/2017   Influenza, Seasonal, Injecte, Preservative Fre 08/14/2008   Influenza,inj,Quad PF,6+ Mos 07/20/2013, 06/20/2014   Influenza,inj,quad,  With Preservative 08/18/2018   Influenza-Unspecified 08/18/2018, 08/07/2020   Moderna Sars-Covid-2 Vaccination 11/28/2019, 12/26/2019, 09/17/2020   Pneumococcal Conjugate-13 08/15/2014   Pneumococcal Polysaccharide-23 05/11/2012   Tdap 05/29/2014    TDAP status: Up to date  Flu Vaccine status: Up to date  Pneumococcal vaccine status: Up to date  Covid-19 vaccine status: Information provided on how to obtain vaccines.   Qualifies for Shingles Vaccine? Yes   Zostavax completed No   Shingrix Completed?: No.    Education has been provided regarding the importance of this vaccine. Patient has been advised to call insurance company to determine out of pocket expense if they have not yet received this vaccine. Advised may also receive vaccine at local pharmacy or Health Dept. Verbalized acceptance and understanding.  Screening Tests Health Maintenance  Topic Date Due   COVID-19 Vaccine (4 - Moderna series) 11/12/2020   FOOT EXAM  06/11/2021   Diabetic kidney evaluation - Urine ACR  12/05/2021   Zoster Vaccines- Shingrix (1 of 2) 10/21/2022 (Originally [birth year]1996)   HEMOGLOBIN A1C   11/27/2022   OPHTHALMOLOGY EXAM  02/05/2023   Diabetic kidney evaluation - GFR measurement  05/28/2023   TETANUS/TDAP  05/29/2024   COLONOSCOPY (Pts 45-93yr Insurance coverage will need to be confirmed)  06/30/2027   Pneumonia Vaccine 76 Years old  Completed   INFLUENZA VACCINE  Completed   Hepatitis C Screening  Completed   HPV VACCINES  Aged Out    Health Maintenance  Health Maintenance Due  Topic Date Due   COVID-19 Vaccine (4 - Moderna series) 11/12/2020   FOOT EXAM  06/11/2021   Diabetic kidney evaluation - Urine ACR  12/05/2021     Colorectal cancer screening: Type of screening: Colonoscopy. Completed 20235. Repeat every 5 years  Lung Cancer Screening: (Low Dose CT Chest recommended if Age 917-80years, 30 pack-year currently smoking OR have quit w/in 15years.) does not qualify.   Lung Cancer Screening Referral:   Additional Screening:  Hepatitis C Screening: does not qualify; Completed 2018  Vision Screening: Recommended annual ophthalmology exams for early detection of glaucoma and other disorders of the eye. Is the patient up to date with their annual eye exam?  Yes  Who is the provider or what is the name of the office in which the patient attends annual eye exams? [facility] If pt is not established with a provider, would they like to be referred to a provider to establish care? No .   Dental Screening: Recommended annual dental exams for proper oral hygiene  Community Resource Referral / Chronic Care Management: CRR required this visit?  No   CCM required this visit?  No      Plan:     I have personally reviewed and noted the following in the patient's chart:   Medical and social history Use of alcohol, tobacco or illicit drugs  Current medications and supplements including opioid prescriptions. Patient is not currently taking opioid prescriptions. Functional ability and status Nutritional status Physical activity Advanced directives List of other  physicians Hospitalizations, surgeries, and ER visits in previous 12 months Vitals Screenings to include cognitive, depression, and falls Referrals and appointments  In addition, I have reviewed and discussed with patient certain preventive protocols, quality metrics, and best practice recommendations. A written personalized care plan for preventive services as well as general preventive health recommendations were provided to patient.     JLeroy Kennedy LPN   [id number]  Nurse Notes:

## 2022-08-16 ENCOUNTER — Encounter (INDEPENDENT_AMBULATORY_CARE_PROVIDER_SITE_OTHER): Payer: Self-pay

## 2022-08-21 ENCOUNTER — Other Ambulatory Visit: Payer: Self-pay | Admitting: Nurse Practitioner

## 2022-08-23 NOTE — Telephone Encounter (Signed)
Requested medications are due for refill today.  yes  Requested medications are on the active medications list.  yes  Last refill. 03/01/2022 #90 1 rf  Future visit scheduled.   yes  Notes to clinic.  Refill not delegated.    Requested Prescriptions  Pending Prescriptions Disp Refills   QUEtiapine (SEROQUEL) 200 MG tablet [Pharmacy Med Name: QUETIAPINE  200MG  TAB] 90 tablet 3    Sig: TAKE 1 TABLET BY MOUTH AT  BEDTIME     Not Delegated - Psychiatry:  Antipsychotics - Second Generation (Atypical) - quetiapine Failed - 08/21/2022 11:00 PM      Failed - This refill cannot be delegated      Failed - TSH in normal range and within 360 days    TSH  Date Value Ref Range Status  05/27/2022 8.240 (H) 0.450 - 4.500 uIU/mL Final         Failed - Lipid Panel in normal range within the last 12 months    Cholesterol, Total  Date Value Ref Range Status  05/27/2022 69 (L) 100 - 199 mg/dL Final   LDL Cholesterol (Calc)  Date Value Ref Range Status  11/29/2017 68 mg/dL (calc) Final    Comment:    Reference range: <100 . Desirable range <100 mg/dL for primary prevention;   <70 mg/dL for patients with CHD or diabetic patients  with > or = 2 CHD risk factors. Marland Kitchen LDL-C is now calculated using the Martin-Hopkins  calculation, which is a validated novel method providing  better accuracy than the Friedewald equation in the  estimation of LDL-C.  Cresenciano Genre et al. Annamaria Helling. 5681;275(17): 2061-2068  (http://education.[facility].com/faq/FAQ164)    LDL Chol Calc (NIH)  Date Value Ref Range Status  05/27/2022 13 0 - 99 mg/dL Final   HDL  Date Value Ref Range Status  05/27/2022 30 (L) >39 mg/dL Final   Triglycerides  Date Value Ref Range Status  05/27/2022 156 (H) 0 - 149 mg/dL Final         Passed - Last BP in normal range    BP Readings from Last 1 Encounters:  06/29/22 109/63         Passed - Last Heart Rate in normal range    Pulse Readings from Last 1 Encounters:  06/29/22  68         Passed - Valid encounter within last 6 months    Recent Outpatient Visits           1 month ago Hypertension associated with diabetes ([place])   [facility] Jon Billings, NP   2 months ago Pre-op evaluation   [place], Santiago Glad, NP   5 months ago Hypertension associated with diabetes [facility])   [facility] Jon Billings, NP   8 months ago Hypertension associated with diabetes [facility])   [facility] Jon Billings, NP   11 months ago Hypertension associated with diabetes [facility])   [facility] Jon Billings, NP       Future Appointments             In 1 month Jon Billings, NP [facility]            Passed - CBC within normal limits and completed in the last 12 months    WBC  Date Value Ref Range Status  05/27/2022 6.7 3.4 - 10.8 x10E3/uL Final  03/24/2017 7.1 3.8 - 10.8 K/uL Final   RBC  Date Value Ref Range  Status  05/27/2022 4.64 4.14 - 5.80 x10E6/uL Final  03/24/2017 4.50 4.20 - 5.80 MIL/uL Final   Hemoglobin  Date Value Ref Range Status  05/27/2022 14.9 13.0 - 17.7 g/dL Final   Hematocrit  Date Value Ref Range Status  05/27/2022 42.7 37.5 - 51.0 % Final   MCHC  Date Value Ref Range Status  05/27/2022 34.9 31.5 - 35.7 g/dL Final  03/24/2017 34.3 32.0 - 36.0 g/dL Final   [facility]  Date Value Ref Range Status  05/27/2022 32.1 26.6 - 33.0 pg Final  03/24/2017 32.2 27.0 - 33.0 pg Final   MCV  Date Value Ref Range Status  05/27/2022 92 79 - 97 fL Final   No results found for: "PLTCOUNTKUC", "LABPLAT", "POCPLA" RDW  Date Value Ref Range Status  05/27/2022 13.5 11.6 - 15.4 % Final         Passed - CMP within normal limits and completed in the last 12 months    Albumin  Date Value Ref Range Status  05/27/2022 4.6 3.8 - 4.8 g/dL Final   Alkaline Phosphatase  Date Value Ref Range Status  05/27/2022 225 (H) 44 - 121 IU/L Final   Alkaline  phosphatase (APISO)  Date Value Ref Range Status  11/29/2017 55 40 - 115 U/L Final   ALT  Date Value Ref Range Status  05/27/2022 69 (H) 0 - 44 IU/L Final   AST  Date Value Ref Range Status  05/27/2022 89 (H) 0 - 40 IU/L Final   BUN  Date Value Ref Range Status  05/27/2022 22 8 - 27 mg/dL Final   Calcium  Date Value Ref Range Status  05/27/2022 9.6 8.6 - 10.2 mg/dL Final   CO2  Date Value Ref Range Status  05/27/2022 21 20 - 29 mmol/L Final   Creat  Date Value Ref Range Status  11/29/2017 1.03 0.70 - 1.18 mg/dL Final    Comment:    For patients >61 years of age, the reference limit for Creatinine is approximately 13% higher for people identified as African-American. .    Creatinine, Ser  Date Value Ref Range Status  05/27/2022 0.91 0.76 - 1.27 mg/dL Final   Creatinine, Urine  Date Value Ref Range Status  04/21/2017 206 20 - 370 mg/dL Final   Glucose  Date Value Ref Range Status  05/27/2022 294 (H) 70 - 99 mg/dL Final  11/12/2014 103 mg/dL Final   Glucose, Bld  Date Value Ref Range Status  11/29/2017 111 (H) 65 - 99 mg/dL Final    Comment:    .            Fasting reference interval . For someone without known diabetes, a glucose value between 100 and 125 mg/dL is consistent with prediabetes and should be confirmed with a follow-up test. .    POC Glucose  Date Value Ref Range Status  03/22/2017 125 (A) 70 - 99 mg/dl Final   Glucose-Capillary  Date Value Ref Range Status  06/29/2022 141 (H) 70 - 99 mg/dL Final    Comment:    Glucose reference range applies only to samples taken after fasting for at least 8 hours.   Potassium  Date Value Ref Range Status  05/27/2022 4.8 3.5 - 5.2 mmol/L Final   Sodium  Date Value Ref Range Status  05/27/2022 136 134 - 144 mmol/L Final   Bilirubin Total  Date Value Ref Range Status  05/27/2022 1.0 0.0 - 1.2 mg/dL Final   Bilirubin, Total  Date Value Ref  Range Status  11/12/2014 0.4 mg/dL Final    Bilirubin, Direct  Date Value Ref Range Status  02/05/2016 0.13 0.00 - 0.40 mg/dL Final   Protein,UA  Date Value Ref Range Status  05/27/2022 Negative Negative/Trace Final   Total Protein  Date Value Ref Range Status  05/27/2022 7.8 6.0 - 8.5 g/dL Final   GFR, Est African American  Date Value Ref Range Status  04/21/2017 86 >=60 mL/min Final   GFR calc Af Amer  Date Value Ref Range Status  12/05/2020 91 >59 mL/min/1.73 Final    Comment:    **In accordance with recommendations from the NKF-ASN Task force,**   [facility] is in the process of updating its eGFR calculation to the   2021 CKD-EPI creatinine equation that estimates kidney function   without a race variable.    eGFR  Date Value Ref Range Status  05/27/2022 87 >59 mL/min/1.73 Final   GFR, Est Non African American  Date Value Ref Range Status  04/21/2017 74 >=60 mL/min Final   GFR calc non Af Amer  Date Value Ref Range Status  12/05/2020 79 >59 mL/min/1.73 Final

## 2022-08-25 ENCOUNTER — Telehealth: Payer: Self-pay | Admitting: Nurse Practitioner

## 2022-08-25 NOTE — Telephone Encounter (Signed)
PT's wife brought in paperwork for [place] Patient Assistance Program Application to be filled out by provider.  Placed in provider's folder.

## 2022-08-26 NOTE — Telephone Encounter (Signed)
Form completed.

## 2022-08-26 NOTE — Telephone Encounter (Signed)
Placed in your sig folder.

## 2022-08-26 NOTE — Telephone Encounter (Signed)
Paperwork placed in [employer].

## 2022-09-24 ENCOUNTER — Other Ambulatory Visit: Payer: Self-pay | Admitting: Nurse Practitioner

## 2022-09-24 ENCOUNTER — Ambulatory Visit: Payer: Medicare Other | Admitting: Nurse Practitioner

## 2022-09-24 DIAGNOSIS — E785 Hyperlipidemia, unspecified: Secondary | ICD-10-CM

## 2022-09-24 DIAGNOSIS — E1159 Type 2 diabetes mellitus with other circulatory complications: Secondary | ICD-10-CM

## 2022-09-24 NOTE — Telephone Encounter (Signed)
Requested Prescriptions  Pending Prescriptions Disp Refills   cloNIDine (CATAPRES) 0.1 MG tablet [Pharmacy Med Name: cloNIDine HCl 0.1 MG Oral Tablet] 180 tablet 0    Sig: TAKE 1 TABLET BY MOUTH TWICE  DAILY     Cardiovascular:  Alpha-2 Agonists Passed - 09/24/2022  6:44 AM      Passed - Last BP in normal range    BP Readings from Last 1 Encounters:  06/29/22 109/63         Passed - Last Heart Rate in normal range    Pulse Readings from Last 1 Encounters:  06/29/22 68         Passed - Valid encounter within last 6 months    Recent Outpatient Visits           3 months ago Hypertension associated with diabetes ([place])   [facility] Jon Billings, NP   4 months ago Pre-op evaluation   [place], Santiago Glad, NP   6 months ago Hypertension associated with diabetes [facility])   [facility] Jon Billings, NP   9 months ago Hypertension associated with diabetes ([place])   [facility] Jon Billings, NP   1 year ago Hypertension associated with diabetes ([place])   [facility] Jon Billings, NP       Future Appointments             In 2 weeks Jon Billings, NP [place], [facility]             atorvastatin (LIPITOR) 40 MG tablet [Pharmacy Med Name: Atorvastatin Calcium 40 MG Oral Tablet] 90 tablet 0    Sig: TAKE 1 TABLET BY MOUTH DAILY AT  6PM     Cardiovascular:  Antilipid - Statins Failed - 09/24/2022  6:44 AM      Failed - Lipid Panel in normal range within the last 12 months    Cholesterol, Total  Date Value Ref Range Status  05/27/2022 69 (L) 100 - 199 mg/dL Final   LDL Cholesterol (Calc)  Date Value Ref Range Status  11/29/2017 68 mg/dL (calc) Final    Comment:    Reference range: <100 . Desirable range <100 mg/dL for primary prevention;   <70 mg/dL for patients with CHD or diabetic patients  with > or = 2 CHD risk factors. Marland Kitchen LDL-C is now calculated using the Martin-Hopkins   calculation, which is a validated novel method providing  better accuracy than the Friedewald equation in the  estimation of LDL-C.  Cresenciano Genre et al. Annamaria Helling. 8502;774(12): 2061-2068  (http://education.[employer].com/faq/FAQ164)    LDL Chol Calc (NIH)  Date Value Ref Range Status  05/27/2022 13 0 - 99 mg/dL Final   HDL  Date Value Ref Range Status  05/27/2022 30 (L) >39 mg/dL Final   Triglycerides  Date Value Ref Range Status  05/27/2022 156 (H) 0 - 149 mg/dL Final         Passed - Patient is not pregnant      Passed - Valid encounter within last 12 months    Recent Outpatient Visits           3 months ago Hypertension associated with diabetes ([place])   [place], Karen, NP   4 months ago Pre-op evaluation   [facility] Jon Billings, NP   6 months ago Hypertension associated with diabetes [facility])   [facility] Jon Billings, NP   9 months ago Hypertension associated with diabetes ([place])  [facility] Jon Billings, NP   1 year ago Hypertension associated with diabetes [facility])   [facility] Jon Billings, NP       Future Appointments             In 2 weeks Jon Billings, NP [facility], [place]

## 2022-10-12 NOTE — Progress Notes (Deleted)
There were no vitals taken for this visit.   Subjective:    Patient ID: Juan Le., male    DOB: December 17, 1945, 76 y.o.   MRN: [id number]  HPI: Juan Le. is a 76 y.o. male  No chief complaint on file.  HYPERTENSION / HYPERLIPIDEMIA Doing well.  Denies concerns at visit today.  Satisfied with current treatment? yes Duration of hypertension: years BP monitoring frequency:no BP range:  BP medication side effects: no Past BP meds:  telmisartan and clonidine Duration of hyperlipidemia: years Cholesterol medication side effects: no Cholesterol supplements: none Past cholesterol medications: atorvastain (lipitor) Medication compliance: excellent compliance Aspirin: yes Recent stressors: no Recurrent headaches: no Visual changes: no Palpitations: no Dyspnea: with excretion Chest pain: no Lower extremity edema: no Dizzy/lightheaded: no  DIABETES Now taking [place] and Trulicity.  Denies concerns regarding medication at visit today.   Hypoglycemic episodes:no Polydipsia/polyuria: no Visual disturbance: no Chest pain: no Paresthesias: yes Glucose Monitoring: no  Accucheck frequency: hasn't been checking it  Fasting glucose:  Post prandial:  Evening:  Before meals: Taking Insulin?: no  Long acting insulin:  Short acting insulin: Blood Pressure Monitoring: sometimes Retinal Examination: Up to Date Foot Exam: Up to Date Diabetic Education: Not Completed Pneumovax: Up to Date Influenza: Up to Date Aspirin: yes  COPD COPD status: controlled Satisfied with current treatment?: yes Oxygen use: no Dyspnea frequency: when he is doing something physical Cough frequency:  Rescue inhaler frequency:  none Limitation of activity: yes Productive cough:  Last Spirometry: 03/04/2021 Pneumovax: Up to Date Influenza: Up to Date  SLEEP APNEA Doesn't even have a CPAP machine.  Not interested in getting another machine or doing another sleep study. Sleep  apnea status: uncontrolled Duration: chronic Satisfied with current treatment?:  no CPAP use:  no- didn't like it Sleep quality with CPAP use: good Treament compliance:poor compliance Last sleep study:  Treatments attempted: CPAP Wakes feeling refreshed:  yes Daytime hypersomnolence:  no Fatigue:  no Insomnia:  no Good sleep hygiene:  no Difficulty falling asleep:  no Difficulty staying asleep:  no Snoring bothers bed partner:   unknown Observed apnea by bed partner: no Obesity:  yes Hypertension: yes  Pulmonary hypertension:  no Coronary artery disease:  yes  NEUROPATHY Patient states his neuropathy is about the same.  Stats the pain is a burning in his feet.  Feels like the gabapentin helps with his pain.    Relevant past medical, surgical, family and social history reviewed and updated as indicated. Interim medical history since our last visit reviewed. Allergies and medications reviewed and updated.  Review of Systems  Eyes:  Negative for visual disturbance.  Respiratory:  Positive for shortness of breath. Negative for chest tightness.   Cardiovascular:  Negative for chest pain, palpitations and leg swelling.  Endocrine: Negative for polydipsia and polyuria.  Musculoskeletal:        Black toenail  Neurological:  Positive for numbness. Negative for dizziness, light-headedness and headaches.    Per HPI unless specifically indicated above     Objective:    There were no vitals taken for this visit.  Wt Readings from Last 3 Encounters:  06/25/22 203 lb 6.4 oz (92.3 kg)  05/27/22 205 lb 12.8 oz (93.4 kg)  03/01/22 209 lb 9.6 oz (95.1 kg)    Physical Exam Vitals and nursing note reviewed.  Constitutional:      General: He is not in acute distress.    Appearance: Normal appearance. He is obese.  He is not ill-appearing, toxic-appearing or diaphoretic.  HENT:     Head: Normocephalic.     Right Ear: External ear normal.     Left Ear: External ear normal.     Nose:  Nose normal. No congestion or rhinorrhea.     Mouth/Throat:     Mouth: Mucous membranes are moist.  Eyes:     General:        Right eye: No discharge.        Left eye: No discharge.     Extraocular Movements: Extraocular movements intact.     Conjunctiva/sclera: Conjunctivae normal.     Pupils: Pupils are equal, round, and reactive to light.  Cardiovascular:     Rate and Rhythm: Normal rate and regular rhythm.     Heart sounds: No murmur heard. Pulmonary:     Effort: Pulmonary effort is normal. No respiratory distress.     Breath sounds: Normal breath sounds. No wheezing, rhonchi or rales.  Abdominal:     General: Abdomen is flat. Bowel sounds are normal.  Musculoskeletal:     Cervical back: Normal range of motion and neck supple.  Feet:     Right foot:     Protective Sensation: 10 sites tested.  8 sites sensed.     Skin integrity: Callus and dry skin present. No skin breakdown, erythema or warmth.     Toenail Condition: Right toenails are abnormally thick and long. Fungal disease present.    Left foot:     Protective Sensation: 10 sites tested.  8 sites sensed.     Skin integrity: Callus and dry skin present. No skin breakdown, erythema or warmth.     Toenail Condition: Left toenails are abnormally thick and long.  Skin:    General: Skin is warm and dry.     Capillary Refill: Capillary refill takes less than 2 seconds.  Neurological:     General: No focal deficit present.     Mental Status: He is alert and oriented to person, place, and time.  Psychiatric:        Mood and Affect: Mood normal.        Behavior: Behavior normal.        Thought Content: Thought content normal.        Judgment: Judgment normal.     Results for orders placed or performed during the hospital encounter of 06/29/22  Glucose, capillary  Result Value Ref Range   Glucose-Capillary 141 (H) 70 - 99 mg/dL   Comment 1 Document in Chart   Surgical pathology  Result Value Ref Range   SURGICAL  PATHOLOGY      SURGICAL PATHOLOGY CASE: [id number] PATIENT: [facility] Surgical Pathology Report     Specimen Submitted: A. Colon polyp, transverse; cbx  Clinical History: History of colon polyps.  Diverticulosis, colon polyp, internal hemorrhoids    DIAGNOSIS: A. COLON POLYP, TRANSVERSE; COLD BIOPSY: - TUBULAR ADENOMA. - NEGATIVE FOR HIGH-GRADE DYSPLASIA AND MALIGNANCY.  GROSS DESCRIPTION: A. Labeled: cbx transverse colon polyp Received: Formalin Collection time: 8:40 AM on 06/29/2022 Placed into formalin time: 8:40 AM on 06/29/2022 Tissue fragment(s): 2 Size: Range from 0.4-0.8 cm Description: Tan soft tissue fragments Entirely submitted in 1 cassette.  RB 06/29/2022  Final Diagnosis performed by Allena Napoleon, MD.   Electronically signed 06/30/2022 9:54:57AM The electronic signature indicates that the named Attending Pathologist has evaluated the specimen Technical component performed at [facility], [address] [place], [place] [address] Lab: [phone] Dir: Mercy Moore, MD, MMM  Professional component performed at [facility], [facility], [place], [place], [place] [address] Lab: [phone] Dir: Kathi Simpers, MD       Assessment & Plan:   Problem List Items Addressed This Visit       Cardiovascular and Mediastinum   Hypertension associated with diabetes [facility])   Aortic atherosclerosis ([place]) - Primary     Respiratory   COPD with chronic bronchitis and emphysema ([place])     Endocrine   Type 2 diabetes mellitus with peripheral neuropathy ([place])   Hyperlipidemia associated with type 2 diabetes mellitus ([place])     Other   Obesity   Vitamin B12 deficiency anemia     Follow up plan: No follow-ups on file.

## 2022-10-13 ENCOUNTER — Ambulatory Visit: Payer: Medicare Other | Admitting: Nurse Practitioner

## 2022-10-13 DIAGNOSIS — I700 Atherosclerosis of aorta: Secondary | ICD-10-CM

## 2022-10-13 DIAGNOSIS — E1169 Type 2 diabetes mellitus with other specified complication: Secondary | ICD-10-CM

## 2022-10-13 DIAGNOSIS — E1142 Type 2 diabetes mellitus with diabetic polyneuropathy: Secondary | ICD-10-CM

## 2022-10-13 DIAGNOSIS — D519 Vitamin B12 deficiency anemia, unspecified: Secondary | ICD-10-CM

## 2022-10-13 DIAGNOSIS — I152 Hypertension secondary to endocrine disorders: Secondary | ICD-10-CM

## 2022-10-13 DIAGNOSIS — J439 Emphysema, unspecified: Secondary | ICD-10-CM

## 2022-10-13 DIAGNOSIS — E669 Obesity, unspecified: Secondary | ICD-10-CM

## 2022-10-27 NOTE — Progress Notes (Unsigned)
There were no vitals taken for this visit.   Subjective:    Patient ID: Juan Gist., male    DOB: 1945-12-12, 77 y.o.   MRN: [id number]  HPI: Juan Carreira. is a 77 y.o. male  No chief complaint on file.  HYPERTENSION / HYPERLIPIDEMIA Doing well.  Denies concerns at visit today.  Satisfied with current treatment? yes Duration of hypertension: years BP monitoring frequency:no BP range:  BP medication side effects: no Past BP meds:  telmisartan and clonidine Duration of hyperlipidemia: years Cholesterol medication side effects: no Cholesterol supplements: none Past cholesterol medications: atorvastain (lipitor) Medication compliance: excellent compliance Aspirin: yes Recent stressors: no Recurrent headaches: no Visual changes: no Palpitations: no Dyspnea: with excretion Chest pain: no Lower extremity edema: no Dizzy/lightheaded: no  DIABETES Now taking [place] and Trulicity.  Denies concerns regarding medication at visit today.   Hypoglycemic episodes:no Polydipsia/polyuria: no Visual disturbance: no Chest pain: no Paresthesias: yes Glucose Monitoring: no  Accucheck frequency: hasn't been checking it  Fasting glucose:  Post prandial:  Evening:  Before meals: Taking Insulin?: no  Long acting insulin:  Short acting insulin: Blood Pressure Monitoring: sometimes Retinal Examination: Up to Date Foot Exam: Up to Date Diabetic Education: Not Completed Pneumovax: Up to Date Influenza: Up to Date Aspirin: yes  COPD COPD status: controlled Satisfied with current treatment?: yes Oxygen use: no Dyspnea frequency: when he is doing something physical Cough frequency:  Rescue inhaler frequency:  none Limitation of activity: yes Productive cough:  Last Spirometry: 03/04/2021 Pneumovax: Up to Date Influenza: Up to Date  SLEEP APNEA Doesn't even have a CPAP machine.  Not interested in getting another machine or doing another sleep study. Sleep  apnea status: uncontrolled Duration: chronic Satisfied with current treatment?:  no CPAP use:  no- didn't like it Sleep quality with CPAP use: good Treament compliance:poor compliance Last sleep study:  Treatments attempted: CPAP Wakes feeling refreshed:  yes Daytime hypersomnolence:  no Fatigue:  no Insomnia:  no Good sleep hygiene:  no Difficulty falling asleep:  no Difficulty staying asleep:  no Snoring bothers bed partner:   unknown Observed apnea by bed partner: no Obesity:  yes Hypertension: yes  Pulmonary hypertension:  no Coronary artery disease:  yes  NEUROPATHY Patient states his neuropathy is about the same.  Stats the pain is a burning in his feet.  Feels like the gabapentin helps with his pain.    Relevant past medical, surgical, family and social history reviewed and updated as indicated. Interim medical history since our last visit reviewed. Allergies and medications reviewed and updated.  Review of Systems  Eyes:  Negative for visual disturbance.  Respiratory:  Positive for shortness of breath. Negative for chest tightness.   Cardiovascular:  Negative for chest pain, palpitations and leg swelling.  Endocrine: Negative for polydipsia and polyuria.  Musculoskeletal:        Black toenail  Neurological:  Positive for numbness. Negative for dizziness, light-headedness and headaches.    Per HPI unless specifically indicated above     Objective:    There were no vitals taken for this visit.  Wt Readings from Last 3 Encounters:  06/25/22 203 lb 6.4 oz (92.3 kg)  05/27/22 205 lb 12.8 oz (93.4 kg)  03/01/22 209 lb 9.6 oz (95.1 kg)    Physical Exam Vitals and nursing note reviewed.  Constitutional:      General: He is not in acute distress.    Appearance: Normal appearance. He is obese.  He is not ill-appearing, toxic-appearing or diaphoretic.  HENT:     Head: Normocephalic.     Right Ear: External ear normal.     Left Ear: External ear normal.     Nose:  Nose normal. No congestion or rhinorrhea.     Mouth/Throat:     Mouth: Mucous membranes are moist.  Eyes:     General:        Right eye: No discharge.        Left eye: No discharge.     Extraocular Movements: Extraocular movements intact.     Conjunctiva/sclera: Conjunctivae normal.     Pupils: Pupils are equal, round, and reactive to light.  Cardiovascular:     Rate and Rhythm: Normal rate and regular rhythm.     Heart sounds: No murmur heard. Pulmonary:     Effort: Pulmonary effort is normal. No respiratory distress.     Breath sounds: Normal breath sounds. No wheezing, rhonchi or rales.  Abdominal:     General: Abdomen is flat. Bowel sounds are normal.  Musculoskeletal:     Cervical back: Normal range of motion and neck supple.  Feet:     Right foot:     Protective Sensation: 10 sites tested.  8 sites sensed.     Skin integrity: Callus and dry skin present. No skin breakdown, erythema or warmth.     Toenail Condition: Right toenails are abnormally thick and long. Fungal disease present.    Left foot:     Protective Sensation: 10 sites tested.  8 sites sensed.     Skin integrity: Callus and dry skin present. No skin breakdown, erythema or warmth.     Toenail Condition: Left toenails are abnormally thick and long.  Skin:    General: Skin is warm and dry.     Capillary Refill: Capillary refill takes less than 2 seconds.  Neurological:     General: No focal deficit present.     Mental Status: He is alert and oriented to person, place, and time.  Psychiatric:        Mood and Affect: Mood normal.        Behavior: Behavior normal.        Thought Content: Thought content normal.        Judgment: Judgment normal.    Results for orders placed or performed during the hospital encounter of 06/29/22  Glucose, capillary  Result Value Ref Range   Glucose-Capillary 141 (H) 70 - 99 mg/dL   Comment 1 Document in Chart   Surgical pathology  Result Value Ref Range   SURGICAL PATHOLOGY       SURGICAL PATHOLOGY CASE: [id number] PATIENT: Juan Le Surgical Pathology Report     Specimen Submitted: A. Colon polyp, transverse; cbx  Clinical History: History of colon polyps.  Diverticulosis, colon polyp, internal hemorrhoids    DIAGNOSIS: A. COLON POLYP, TRANSVERSE; COLD BIOPSY: - TUBULAR ADENOMA. - NEGATIVE FOR HIGH-GRADE DYSPLASIA AND MALIGNANCY.  GROSS DESCRIPTION: A. Labeled: cbx transverse colon polyp Received: Formalin Collection time: 8:40 AM on 06/29/2022 Placed into formalin time: 8:40 AM on 06/29/2022 Tissue fragment(s): 2 Size: Range from 0.4-0.8 cm Description: Tan soft tissue fragments Entirely submitted in 1 cassette.  RB 06/29/2022  Final Diagnosis performed by Allena Napoleon, MD.   Electronically signed 06/30/2022 9:54:57AM The electronic signature indicates that the named Attending Pathologist has evaluated the specimen Technical component performed at [facility], [address], [place], [place] [address] Lab: [phone] Dir: Mercy Moore, MD, MMM  Professional component performed at [facility], [facility], [place], [place],  [address] Lab: [phone] Dir: Kathi Simpers, MD       Assessment & Plan:   Problem List Items Addressed This Visit      Cardiovascular and Mediastinum   Hypertension associated with diabetes [facility])   MI (mitral incompetence)   Aortic atherosclerosis ([place]) - Primary     Respiratory   COPD with chronic bronchitis and emphysema ([place])     Endocrine   Type 2 diabetes mellitus with peripheral neuropathy ([place])   Hyperlipidemia associated with type 2 diabetes mellitus ([place])     Other   Obesity   Vitamin B12 deficiency anemia     Follow up plan: No follow-ups on file.

## 2022-10-28 ENCOUNTER — Encounter: Payer: Self-pay | Admitting: Nurse Practitioner

## 2022-10-28 ENCOUNTER — Ambulatory Visit (INDEPENDENT_AMBULATORY_CARE_PROVIDER_SITE_OTHER): Payer: Medicare Other | Admitting: Nurse Practitioner

## 2022-10-28 VITALS — BP 104/65 | HR 60 | Temp 97.8°F | Ht 70.0 in | Wt 200.2 lb

## 2022-10-28 DIAGNOSIS — I152 Hypertension secondary to endocrine disorders: Secondary | ICD-10-CM

## 2022-10-28 DIAGNOSIS — D519 Vitamin B12 deficiency anemia, unspecified: Secondary | ICD-10-CM

## 2022-10-28 DIAGNOSIS — E1159 Type 2 diabetes mellitus with other circulatory complications: Secondary | ICD-10-CM

## 2022-10-28 DIAGNOSIS — E1142 Type 2 diabetes mellitus with diabetic polyneuropathy: Secondary | ICD-10-CM

## 2022-10-28 DIAGNOSIS — I340 Nonrheumatic mitral (valve) insufficiency: Secondary | ICD-10-CM

## 2022-10-28 DIAGNOSIS — J439 Emphysema, unspecified: Secondary | ICD-10-CM

## 2022-10-28 DIAGNOSIS — K219 Gastro-esophageal reflux disease without esophagitis: Secondary | ICD-10-CM

## 2022-10-28 DIAGNOSIS — I700 Atherosclerosis of aorta: Secondary | ICD-10-CM

## 2022-10-28 DIAGNOSIS — J4489 Other specified chronic obstructive pulmonary disease: Secondary | ICD-10-CM | POA: Diagnosis not present

## 2022-10-28 DIAGNOSIS — Z6833 Body mass index (BMI) 33.0-33.9, adult: Secondary | ICD-10-CM

## 2022-10-28 DIAGNOSIS — E1169 Type 2 diabetes mellitus with other specified complication: Secondary | ICD-10-CM | POA: Diagnosis not present

## 2022-10-28 DIAGNOSIS — E785 Hyperlipidemia, unspecified: Secondary | ICD-10-CM | POA: Diagnosis not present

## 2022-10-28 DIAGNOSIS — E669 Obesity, unspecified: Secondary | ICD-10-CM

## 2022-10-28 LAB — MICROALBUMIN, URINE WAIVED
Creatinine, Urine Waived: 50 mg/dL (ref 10–300)
Microalb, Ur Waived: 30 mg/L — ABNORMAL HIGH (ref 0–19)

## 2022-10-28 MED ORDER — QUETIAPINE FUMARATE 200 MG PO TABS: 200.0000 mg | ORAL_TABLET | Freq: Every day | ORAL | 3 refills | Status: DC

## 2022-10-28 MED ORDER — OMEPRAZOLE 40 MG PO CPDR: 40.0000 mg | DELAYED_RELEASE_CAPSULE | Freq: Every day | ORAL | 4 refills | Status: DC

## 2022-10-28 MED ORDER — CLONIDINE HCL 0.1 MG PO TABS: 0.1000 mg | ORAL_TABLET | Freq: Two times a day (BID) | ORAL | 1 refills | Status: DC

## 2022-10-28 MED ORDER — ATORVASTATIN CALCIUM 40 MG PO TABS: ORAL_TABLET | ORAL | 1 refills | Status: DC

## 2022-10-28 MED ORDER — METFORMIN HCL 1000 MG PO TABS: 1000.0000 mg | ORAL_TABLET | Freq: Two times a day (BID) | ORAL | 4 refills | Status: DC

## 2022-10-28 MED ORDER — TELMISARTAN 40 MG PO TABS: 40.0000 mg | ORAL_TABLET | Freq: Every day | ORAL | 4 refills | Status: DC

## 2022-10-28 MED ORDER — DAPAGLIFLOZIN PROPANEDIOL 10 MG PO TABS: 10.0000 mg | ORAL_TABLET | Freq: Every day | ORAL | 1 refills | Status: DC

## 2022-10-28 MED ORDER — GABAPENTIN 400 MG PO CAPS: ORAL_CAPSULE | ORAL | 1 refills | Status: DC

## 2022-10-28 NOTE — Assessment & Plan Note (Signed)
Chronic.  Controlled.  Continue with current medication regimen.  Denies concerns about breathing today. Return to clinic in 3 months for reevaluation.  Call sooner if concerns arise.

## 2022-10-28 NOTE — Assessment & Plan Note (Signed)
Last MI was in 2018.

## 2022-10-28 NOTE — Assessment & Plan Note (Signed)
Chronic.  Controlled.  Continue with current medication regimen.  Labs ordered today.  Return to clinic in 3 months for reevaluation.  Call sooner if concerns arise.   

## 2022-10-28 NOTE — Assessment & Plan Note (Signed)
Chronic.  Controlled.  Continue with current medication regimen.  Microalbumin collected today.  Not currently checking blood sugars. Continue Farxiga '10mg'$  and Trulicity .'75mg'$ .  Will Increase Trulicity of S1J is not at goal.  Patient receives Trulicity through patient assistance program.  Last A1c was 5.5. Return to clinic in 3 months for reevaluation.  Call sooner if concerns arise.

## 2022-10-28 NOTE — Assessment & Plan Note (Signed)
Continue with ASA.  Continue with Atorvastatin '40mg'$  daily.  Refills sent today.  Follow up in 3 months.  Call sooner if concerns arise.

## 2022-10-28 NOTE — Assessment & Plan Note (Signed)
Chronic.  Controlled.  Continue with current medication regimen of Gabapentin.  Taking 4 tabs daily.  Feels like it is working well to control his symptoms.  Refill sent today.   Return to clinic in 3 months for reevaluation.  Call sooner if concerns arise.

## 2022-10-28 NOTE — Assessment & Plan Note (Signed)
Recommended eating smaller high protein, low fat meals more frequently and exercising 30 mins a day 5 times a week with a goal of 10-15lb weight loss in the next 3 months.  

## 2022-10-28 NOTE — Assessment & Plan Note (Signed)
Chronic. Controlled.  Continue with current medication regimen of Atorvastatin '40mg'$  daily.  Refills sent today. Return in 3 months.  Call sooner if concerns arise.

## 2022-10-29 LAB — CBC WITH DIFFERENTIAL/PLATELET
Basophils Absolute: 0.1 10*3/uL (ref 0.0–0.2)
Basos: 1 %
EOS (ABSOLUTE): 0.1 10*3/uL (ref 0.0–0.4)
Eos: 2 %
Hematocrit: 43.9 % (ref 37.5–51.0)
Hemoglobin: 14.7 g/dL (ref 13.0–17.7)
Immature Grans (Abs): 0 10*3/uL (ref 0.0–0.1)
Immature Granulocytes: 0 %
Lymphocytes Absolute: 2.6 10*3/uL (ref 0.7–3.1)
Lymphs: 35 %
MCH: 30.6 pg (ref 26.6–33.0)
MCHC: 33.5 g/dL (ref 31.5–35.7)
MCV: 91 fL (ref 79–97)
Monocytes Absolute: 0.6 10*3/uL (ref 0.1–0.9)
Monocytes: 8 %
Neutrophils Absolute: 4 10*3/uL (ref 1.4–7.0)
Neutrophils: 54 %
Platelets: 194 10*3/uL (ref 150–450)
RBC: 4.81 x10E6/uL (ref 4.14–5.80)
RDW: 14.2 % (ref 11.6–15.4)
WBC: 7.4 10*3/uL (ref 3.4–10.8)

## 2022-10-29 LAB — COMPREHENSIVE METABOLIC PANEL
ALT: 28 IU/L (ref 0–44)
AST: 24 IU/L (ref 0–40)
Albumin/Globulin Ratio: 1.8 (ref 1.2–2.2)
Albumin: 4.5 g/dL (ref 3.8–4.8)
Alkaline Phosphatase: 65 IU/L (ref 44–121)
BUN/Creatinine Ratio: 15 (ref 10–24)
BUN: 15 mg/dL (ref 8–27)
Bilirubin Total: 0.3 mg/dL (ref 0.0–1.2)
CO2: 20 mmol/L (ref 20–29)
Calcium: 9.5 mg/dL (ref 8.6–10.2)
Chloride: 101 mmol/L (ref 96–106)
Creatinine, Ser: 0.98 mg/dL (ref 0.76–1.27)
Globulin, Total: 2.5 g/dL (ref 1.5–4.5)
Glucose: 101 mg/dL — ABNORMAL HIGH (ref 70–99)
Potassium: 4.4 mmol/L (ref 3.5–5.2)
Sodium: 140 mmol/L (ref 134–144)
Total Protein: 7 g/dL (ref 6.0–8.5)
eGFR: 80 mL/min/{1.73_m2} (ref >59)

## 2022-10-29 LAB — HEMOGLOBIN A1C
Est. average glucose Bld gHb Est-mCnc: 117 mg/dL
Hgb A1c MFr Bld: 5.7 % — ABNORMAL HIGH (ref 4.8–5.6)

## 2022-10-29 NOTE — Progress Notes (Signed)
Please let patient know that his lab work looks great.  His A1c is well controlled at 5.7.  Liver enzymes improved from prior which is great news.  Microalbumin does she that she he is dumping some protein from his kidneys due to his diabetes.  We will keep an eye on this in the future.  Continue with current medication regimen.  Follow up as discussed.

## 2022-12-01 ENCOUNTER — Ambulatory Visit (INDEPENDENT_AMBULATORY_CARE_PROVIDER_SITE_OTHER): Payer: Medicare Other | Admitting: Nurse Practitioner

## 2022-12-01 ENCOUNTER — Encounter: Payer: Self-pay | Admitting: Nurse Practitioner

## 2022-12-01 VITALS — BP 128/75 | HR 57 | Temp 98.1°F | Wt 202.7 lb

## 2022-12-01 DIAGNOSIS — Z951 Presence of aortocoronary bypass graft: Secondary | ICD-10-CM | POA: Diagnosis not present

## 2022-12-01 DIAGNOSIS — I700 Atherosclerosis of aorta: Secondary | ICD-10-CM

## 2022-12-01 DIAGNOSIS — I340 Nonrheumatic mitral (valve) insufficiency: Secondary | ICD-10-CM | POA: Diagnosis not present

## 2022-12-01 NOTE — Assessment & Plan Note (Signed)
Referral placed for patient to see Cardiology.

## 2022-12-01 NOTE — Progress Notes (Signed)
BP 128/75   Pulse (!) 57   Temp 98.1 F (36.7 C) (Oral)   Wt 202 lb 11.2 oz (91.9 kg)   SpO2 97%   BMI 29.08 kg/m    Subjective:    Patient ID: Juan Le., male    DOB: 1946-05-17, 77 y.o.   MRN: [id number]  HPI: Juan Le. is a 77 y.o. male  Chief Complaint  Patient presents with   Referral    Patient requesting referral to go see cardiology, requesting to see Christell Faith   Patient states he has had a little bit of chest pain.  He does hae some SOB.  He has a history of coronary artery bypass back in 1997.  He also has some stents in place.  He also has some mitral valve incompetence.     Relevant past medical, surgical, family and social history reviewed and updated as indicated. Interim medical history since our last visit reviewed. Allergies and medications reviewed and updated.  Review of Systems  Respiratory:  Positive for shortness of breath.   Cardiovascular:  Positive for chest pain.    Per HPI unless specifically indicated above     Objective:    BP 128/75   Pulse (!) 57   Temp 98.1 F (36.7 C) (Oral)   Wt 202 lb 11.2 oz (91.9 kg)   SpO2 97%   BMI 29.08 kg/m   Wt Readings from Last 3 Encounters:  12/01/22 202 lb 11.2 oz (91.9 kg)  10/28/22 200 lb 3.2 oz (90.8 kg)  06/25/22 203 lb 6.4 oz (92.3 kg)    Physical Exam Vitals and nursing note reviewed.  Constitutional:      General: He is not in acute distress.    Appearance: Normal appearance. He is not ill-appearing, toxic-appearing or diaphoretic.  HENT:     Head: Normocephalic.     Right Ear: External ear normal.     Left Ear: External ear normal.     Nose: Nose normal. No congestion or rhinorrhea.     Mouth/Throat:     Mouth: Mucous membranes are moist.  Eyes:     General:        Right eye: No discharge.        Left eye: No discharge.     Extraocular Movements: Extraocular movements intact.     Conjunctiva/sclera: Conjunctivae normal.     Pupils: Pupils are equal,  round, and reactive to light.  Cardiovascular:     Rate and Rhythm: Normal rate and regular rhythm.     Heart sounds: No murmur heard. Pulmonary:     Effort: Pulmonary effort is normal. No respiratory distress.     Breath sounds: Normal breath sounds. No wheezing, rhonchi or rales.  Abdominal:     General: Abdomen is flat. Bowel sounds are normal.  Musculoskeletal:     Cervical back: Normal range of motion and neck supple.  Skin:    General: Skin is warm and dry.     Capillary Refill: Capillary refill takes less than 2 seconds.  Neurological:     General: No focal deficit present.     Mental Status: He is alert and oriented to person, place, and time.  Psychiatric:        Mood and Affect: Mood normal.        Behavior: Behavior normal.        Thought Content: Thought content normal.        Judgment: Judgment normal.  Results for orders placed or performed in visit on 10/28/22  Comp Met (CMET)  Result Value Ref Range   Glucose 101 (H) 70 - 99 mg/dL   BUN 15 8 - 27 mg/dL   Creatinine, Ser 0.98 0.76 - 1.27 mg/dL   eGFR 80 >59 mL/min/1.73   BUN/Creatinine Ratio 15 10 - 24   Sodium 140 134 - 144 mmol/L   Potassium 4.4 3.5 - 5.2 mmol/L   Chloride 101 96 - 106 mmol/L   CO2 20 20 - 29 mmol/L   Calcium 9.5 8.6 - 10.2 mg/dL   Total Protein 7.0 6.0 - 8.5 g/dL   Albumin 4.5 3.8 - 4.8 g/dL   Globulin, Total 2.5 1.5 - 4.5 g/dL   Albumin/Globulin Ratio 1.8 1.2 - 2.2   Bilirubin Total 0.3 0.0 - 1.2 mg/dL   Alkaline Phosphatase 65 44 - 121 IU/L   AST 24 0 - 40 IU/L   ALT 28 0 - 44 IU/L  HgB A1c  Result Value Ref Range   Hgb A1c MFr Bld 5.7 (H) 4.8 - 5.6 %   Est. average glucose Bld gHb Est-mCnc 117 mg/dL  Microalbumin, Urine Waived  Result Value Ref Range   Microalb, Ur Waived 30 (H) 0 - 19 mg/L   Creatinine, Urine Waived 50 10 - 300 mg/dL   Microalb/Creat Ratio 30-300 (H) <30 mg/g  CBC w/Diff  Result Value Ref Range   WBC 7.4 3.4 - 10.8 x10E3/uL   RBC 4.81 4.14 - 5.80  x10E6/uL   Hemoglobin 14.7 13.0 - 17.7 g/dL   Hematocrit 43.9 37.5 - 51.0 %   MCV 91 79 - 97 fL   MCH 30.6 26.6 - 33.0 pg   MCHC 33.5 31.5 - 35.7 g/dL   RDW 14.2 11.6 - 15.4 %   Platelets 194 150 - 450 x10E3/uL   Neutrophils 54 Not Estab. %   Lymphs 35 Not Estab. %   Monocytes 8 Not Estab. %   Eos 2 Not Estab. %   Basos 1 Not Estab. %   Neutrophils Absolute 4.0 1.4 - 7.0 x10E3/uL   Lymphocytes Absolute 2.6 0.7 - 3.1 x10E3/uL   Monocytes Absolute 0.6 0.1 - 0.9 x10E3/uL   EOS (ABSOLUTE) 0.1 0.0 - 0.4 x10E3/uL   Basophils Absolute 0.1 0.0 - 0.2 x10E3/uL   Immature Granulocytes 0 Not Estab. %   Immature Grans (Abs) 0.0 0.0 - 0.1 x10E3/uL      Assessment & Plan:   Problem List Items Addressed This Visit       Cardiovascular and Mediastinum   MI (mitral incompetence)    Referral placed for patient to see Cardiology.        Relevant Orders   Ambulatory referral to Cardiology   Aortic atherosclerosis [facility]) - Primary    Referral placed for patient to see Cardiology.        Relevant Orders   Ambulatory referral to Cardiology     Other   H/O coronary artery bypass surgery    Referral placed for patient to see Cardiology.        Relevant Orders   Ambulatory referral to Cardiology     Follow up plan: Return if symptoms worsen or fail to improve.

## 2022-12-14 ENCOUNTER — Ambulatory Visit: Payer: Medicare Other | Attending: Cardiovascular Disease | Admitting: Cardiovascular Disease

## 2022-12-14 ENCOUNTER — Encounter: Payer: Self-pay | Admitting: Cardiovascular Disease

## 2022-12-14 VITALS — BP 112/60 | HR 66 | Ht 70.0 in | Wt 204.4 lb

## 2022-12-14 DIAGNOSIS — R0602 Shortness of breath: Secondary | ICD-10-CM

## 2022-12-14 DIAGNOSIS — I10 Essential (primary) hypertension: Secondary | ICD-10-CM | POA: Diagnosis not present

## 2022-12-14 DIAGNOSIS — I25118 Atherosclerotic heart disease of native coronary artery with other forms of angina pectoris: Secondary | ICD-10-CM

## 2022-12-14 DIAGNOSIS — E785 Hyperlipidemia, unspecified: Secondary | ICD-10-CM | POA: Diagnosis not present

## 2022-12-14 DIAGNOSIS — R072 Precordial pain: Secondary | ICD-10-CM

## 2022-12-14 NOTE — Patient Instructions (Signed)
Medication Instructions:  No changes *If you need a refill on your cardiac medications before your next appointment, please call your pharmacy*   Lab Work: None ordered If you have labs (blood work) drawn today and your tests are completely normal, you will receive your results only by: [place] (if you have MyChart) OR A paper copy in the mail If you have any lab test that is abnormal or we need to change your treatment, we will call you to review the results.   Testing/Procedures: Your provider has ordered a Lexiscan/ Exercise Myoview Stress test. This will take place at [facility]. Please report to the [facility] medical mall entrance. The volunteers at the first desk will direct you where to go.  [place]  Your provider has ordered a Stress Test with nuclear imaging. The purpose of this test is to evaluate the blood supply to your heart muscle. This procedure is referred to as a "Non-Invasive Stress Test." This is because other than having an IV started in your vein, nothing is inserted or "invades" your body. Cardiac stress tests are done to find areas of poor blood flow to the heart by determining the extent of coronary artery disease (CAD). Some patients exercise on a treadmill, which naturally increases the blood flow to your heart, while others who are unable to walk on a treadmill due to physical limitations will have a pharmacologic/chemical stress agent called Lexiscan . This medicine will mimic walking on a treadmill by temporarily increasing your coronary blood flow.   Please note: these test may take anywhere between 2-4 hours to complete  How to prepare for your Myoview test:  Nothing to eat for 6 hours prior to the test No caffeine for 24 hours prior to test No smoking 24 hours prior to test. Your medication may be taken with water.  If your doctor stopped a medication because of this test, do not take that medication. Ladies, please do not wear dresses.  Skirts or pants  are appropriate. Please wear a short sleeve shirt. No perfume, cologne or lotion. Wear comfortable walking shoes. No heels!   PLEASE NOTIFY THE OFFICE AT LEAST 24 HOURS IN ADVANCE IF YOU ARE UNABLE TO KEEP YOUR APPOINTMENT.  [phone] AND  PLEASE NOTIFY NUCLEAR MEDICINE AT [facility] AT LEAST 24 HOURS IN ADVANCE IF YOU ARE UNABLE TO KEEP YOUR APPOINTMENT. [phone]  Your physician has requested that you have an echocardiogram. Echocardiography is a painless test that uses sound waves to create images of your heart. It provides your doctor with information about the size and shape of your heart and how well your heart's chambers and valves are working.   You may receive an ultrasound enhancing agent through an IV if needed to better visualize your heart during the echo. This procedure takes approximately one hour.  There are no restrictions for this procedure.  This will take place at [place] ([place]) [address], [place]    Follow-Up: At [facility], you and your health needs are our priority.  As part of our continuing mission to provide you with exceptional heart care, we have created designated Provider Care Teams.  These Care Teams include your primary Cardiologist (physician) and Advanced Practice Providers (APPs -  Physician Assistants and Nurse Practitioners) who all work together to provide you with the care you need, when you need it.  We recommend signing up for the patient portal called "MyChart".  Sign up information is provided on this  After Visit Summary.  MyChart is used to connect with patients for Virtual Visits (Telemedicine).  Patients are able to view lab/test results, encounter notes, upcoming appointments, etc.  Non-urgent messages can be sent to your provider as well.   To learn more about what you can do with MyChart, go to [email or web address].    Your next appointment:   2 month(s)  Provider:   You may see Dr. Fletcher Anon or  one of the following Advanced Practice Providers on your designated Care Team:   Murray Hodgkins, NP Christell Faith, PA-C Cadence Kathlen Mody, PA-C Gerrie Nordmann, NP

## 2022-12-14 NOTE — Progress Notes (Signed)
Cardiology Office Note   Date:  12/14/2022   ID:  Juan Le., DOB January 08, 1946, MRN [id number]  PCP:  Jon Billings, NP  Cardiologist:   Kathlyn Sacramento, MD   Chief Complaint  Patient presents with   Other    Aortic Atherosclerosis/Mitral Valve insuffiencey no complaints today. Meds reviewed verbally with pt.      History of Present Illness: Juan Le. is a 77 y.o. male who was referred by Jon Billings for evaluation of coronary artery disease and valvular disease.  He has known history of coronary artery disease status post CABG in 1997 at [facility].  Most recent cardiac catheterization was in 2008 and showed significant underlying three-vessel coronary artery disease with patent LIMA to LAD, patent SVG to right PDA, patent SVG to OM and patent SVG to diagonal which had a 90% ostial stenosis.  This was treated with PCI and drug-eluting stent placement.  No cardiac events since then. He has chronic medical conditions that include hyperlipidemia, essential hypertension, type 2 diabetes, GERD and obesity. He had a recent episode of shortness of breath and substernal chest tightness while he was eating.  It lasted for about 15 to 20 minutes.  He reports exertional dyspnea without chest discomfort.  No recent cardiac evaluation. He denies lower extremity claudication and he had an ABI done few years ago that was normal. He does not exercise on a regular basis.    Past Medical History:  Diagnosis Date   Anxiety    Atelectasis of left lung 03/25/2017   Chronic, continuous use of opioids    COPD (chronic obstructive pulmonary disease) (HCC)    Coronary artery disease    Degenerative disc disease, lumbar    Diabetes (HCC)    DJD (degenerative joint disease)    GERD (gastroesophageal reflux disease)    Heart attack ([place])    History of diverticulitis    Hypertension    Hypertension    Hypertriglyceridemia    Hypokalemia    Insomnia    Mitral regurgitation     Sleep apnea    Vitamin D deficiency    Wears dentures    full upper    Past Surgical History:  Procedure Laterality Date   COLONOSCOPY WITH PROPOFOL N/A 01/28/2017   Procedure: COLONOSCOPY WITH PROPOFOL;  Surgeon: [name], MD;  Location: [facility];  Service: Endoscopy;  Laterality: N/A;  diabetic - oral meds   COLONOSCOPY WITH PROPOFOL N/A 06/29/2022   Procedure: COLONOSCOPY WITH PROPOFOL;  Surgeon: [name], MD;  Location: [facility] ENDOSCOPY;  Service: Endoscopy;  Laterality: N/A;   CORONARY ARTERY BYPASS GRAFT  1997   CORONARY STENT PLACEMENT  11/2006   FOOT SURGERY  2003   HERNIA REPAIR     POLYPECTOMY N/A 01/28/2017   Procedure: POLYPECTOMY;  Surgeon: [name], MD;  Location: [facility];  Service: Endoscopy;  Laterality: N/A;   UMBILICAL HERNIA REPAIR  04/2012     Current Outpatient Medications  Medication Sig Dispense Refill   aspirin 81 MG tablet Take 1 tablet by mouth daily.     atorvastatin (LIPITOR) 40 MG tablet Take 1 tab daily 90 tablet 1   Blood Glucose Monitoring Suppl (ONETOUCH VERIO FLEX SYSTEM) w/Device KIT USE UP TO 4 TIMES DAILY AS  DIRECTED 1 kit 0   cloNIDine (CATAPRES) 0.1 MG tablet Take 1 tablet (0.1 mg total) by mouth 2 (two) times daily. 180 tablet 1   dapagliflozin propanediol (FARXIGA) 10 MG TABS tablet  Take 1 tablet (10 mg total) by mouth daily before breakfast. 90 tablet 1   Dulaglutide (TRULICITY) A999333 0000000 SOPN Inject 0.75 mg into the skin once a week. 6 mL 1   gabapentin (NEURONTIN) 400 MG capsule Take 4 capsules by mouth daily 540 capsule 1   Lancets (ONETOUCH DELICA PLUS 123XX123) MISC USE AS DIRECTED TO CHECK  BLOOD GLUCOSE ONCE DAILY 100 each 3   metFORMIN (GLUCOPHAGE) 1000 MG tablet Take 1 tablet (1,000 mg total) by mouth 2 (two) times daily with a meal. 180 tablet 4   omeprazole (PRILOSEC) 40 MG capsule Take 1 capsule (40 mg total) by mouth daily. 90 capsule 4   ONETOUCH VERIO test strip USE AS INSTRUCTED  TO TEST   BLOOD SUGAR TWO TIMES DAILY 200 strip 3   QUEtiapine (SEROQUEL) 200 MG tablet Take 1 tablet (200 mg total) by mouth at bedtime. 90 tablet 3   telmisartan (MICARDIS) 40 MG tablet Take 1 tablet (40 mg total) by mouth daily. 90 tablet 4   No current facility-administered medications for this visit.    Allergies:   Ace inhibitors and Diphenhydramine    Social History:  The patient  reports that he has quit smoking. His smoking use included cigarettes. He has never used smokeless tobacco. He reports that he does not drink alcohol and does not use drugs.   Family History:  The patient's family history includes Arthritis in his brother; Bone cancer in his brother; Breast cancer in his paternal aunt; Cancer in his father; Colon cancer in his father; Depression in his father and sister; Heart disease in his father and mother; Hypertension in his father; Kidney disease in his mother; Kidney failure in his mother; Parkinson's disease in his father; Stroke in his brother, maternal grandmother, and mother; Throat cancer in his father.    ROS:  Please see the history of present illness.   Otherwise, review of systems are positive for none.   All other systems are reviewed and negative.    PHYSICAL EXAM: VS:  BP 112/60 (BP Location: Right Arm, Patient Position: Sitting, Cuff Size: Normal)   Pulse 66   Ht '5\' 10"'$  (1.778 m)   Wt 204 lb 6 oz (92.7 kg)   SpO2 96%   BMI 29.32 kg/m  , BMI Body mass index is 29.32 kg/m. GEN: Well nourished, well developed, in no acute distress  HEENT: normal  Neck: no JVD, carotid bruits, or masses Cardiac: RRR; no murmurs, rubs, or gallops,no edema  Respiratory:  clear to auscultation bilaterally, normal work of breathing GI: soft, nontender, nondistended, + BS MS: no deformity or atrophy  Skin: warm and dry, no rash Neuro:  Strength and sensation are intact Psych: euthymic mood, full affect   EKG:  EKG is ordered today. The ekg ordered today demonstrates normal  sinus rhythm with poor R wave progression in the anterior leads suggestive of prior infarct.  No significant ST or T wave changes.   Recent Labs: 05/27/2022: TSH 8.240 10/28/2022: ALT 28; BUN 15; Creatinine, Ser 0.98; Hemoglobin 14.7; Platelets 194; Potassium 4.4; Sodium 140    Lipid Panel    Component Value Date/Time   CHOL 69 (L) 05/27/2022 1008   TRIG 156 (H) 05/27/2022 1008   HDL 30 (L) 05/27/2022 1008   CHOLHDL 2.3 05/27/2022 1008   CHOLHDL 3.9 11/29/2017 0936   VLDL 42 (H) 04/21/2017 0852   LDLCALC 13 05/27/2022 1008   LDLCALC 68 11/29/2017 0936      Wt Readings  from Last 3 Encounters:  12/14/22 204 lb 6 oz (92.7 kg)  12/01/22 202 lb 11.2 oz (91.9 kg)  10/28/22 200 lb 3.2 oz (90.8 kg)          12/14/2022    2:36 PM  PAD Screen  Previous PAD dx? No  Previous surgical procedure? Yes  Pain with walking? No  Feet/toe relief with dangling? No  Painful, non-healing ulcers? No  Extremities discolored? No      ASSESSMENT AND PLAN:  1.  Coronary artery disease involving native coronary arteries with other forms of angina: He had remote CABG in 1997 and PCI to SVG to diagonal in 2008.  He had recent episodes of chest pain and shortness of breath that is worrisome for progression of underlying graft disease.  I requested a Lexiscan nuclear stress test for evaluation.  He is not able to exercise on a treadmill. I also requested an echocardiogram given significant shortness of breath and no recent EF evaluation.  2.  Essential hypertension: Blood pressure is controlled on current medications but we should consider switching clonidine to another antihypertensive medication.  3.  Hyperlipidemia: Currently on atorvastatin 40 mg once daily.  Most recent lipid profile showed an LDL of 13.  4.  Aortic atherosclerosis: No claudication and previous ABI in 2021 was normal.  He has palpable pulses by exam.  Continue treatment of risk factors.    Disposition:   FU with ME in 2  months  Signed,  Kathlyn Sacramento, MD  12/14/2022 4:13 PM    [facility]

## 2022-12-21 ENCOUNTER — Encounter
Admission: RE | Admit: 2022-12-21 | Discharge: 2022-12-21 | Disposition: A | Discharge status: Home / Self Care | Payer: Medicare Other | Source: Ambulatory Visit | Attending: Cardiovascular Disease | Admitting: Cardiovascular Disease

## 2022-12-21 DIAGNOSIS — R072 Precordial pain: Secondary | ICD-10-CM

## 2022-12-21 MED ORDER — TECHNETIUM TC 99M TETROFOSMIN IV KIT
10.0000 | PACK | Freq: Once | INTRAVENOUS | Status: AC | PRN
  Administered 2022-12-21: 10.62 via INTRAVENOUS

## 2022-12-21 MED ORDER — TECHNETIUM TC 99M TETROFOSMIN IV KIT
30.5800 | PACK | Freq: Once | INTRAVENOUS | Status: AC | PRN
  Administered 2022-12-21: 30.58 via INTRAVENOUS

## 2022-12-21 MED ORDER — REGADENOSON 0.4 MG/5ML IV SOLN
0.4000 mg | Freq: Once | INTRAVENOUS | Status: AC
  Administered 2022-12-21: 0.4 mg via INTRAVENOUS

## 2022-12-22 LAB — NM MYOCAR MULTI W/SPECT W/WALL MOTION / EF
LV dias vol: 87 mL (ref 62–150)
LV sys vol: 41 mL
Nuc Stress EF: 53 %
Peak HR: 71 {beats}/min
Rest HR: 60 {beats}/min
Rest Nuclear Isotope Dose: 10.6 mCi
SDS: 12
SRS: 17
SSS: 28
ST Depression (mm): 0 mm
Stress Nuclear Isotope Dose: 30.6 mCi
TID: 0.98

## 2022-12-24 ENCOUNTER — Telehealth: Payer: Self-pay | Admitting: *Deleted

## 2022-12-24 NOTE — Telephone Encounter (Signed)
Patient made aware of results and verbalized understanding.  Appointment made for 3/12 with Dr. Fletcher Anon to discuss the cath.

## 2022-12-24 NOTE — Telephone Encounter (Signed)
-----   Message from Wellington Hampshire, MD sent at 12/23/2022 12:30 PM EST ----- Inform patient that  stress test was highly abnormal.  I recommend proceeding with left heart catheterization and possible PCI.  I can do next week at [facility].  I am in the Cath Lab on Wednesday and Friday.

## 2022-12-24 NOTE — Telephone Encounter (Signed)
Attempted to reach the patient. The mailbox was full

## 2022-12-28 ENCOUNTER — Ambulatory Visit: Payer: Medicare Other | Attending: Cardiovascular Disease | Admitting: Cardiovascular Disease

## 2022-12-28 ENCOUNTER — Encounter: Payer: Self-pay | Admitting: Cardiovascular Disease

## 2022-12-28 VITALS — BP 120/62 | HR 69 | Ht 70.0 in | Wt 199.4 lb

## 2022-12-28 DIAGNOSIS — E785 Hyperlipidemia, unspecified: Secondary | ICD-10-CM | POA: Diagnosis not present

## 2022-12-28 DIAGNOSIS — I10 Essential (primary) hypertension: Secondary | ICD-10-CM | POA: Diagnosis not present

## 2022-12-28 DIAGNOSIS — I25110 Atherosclerotic heart disease of native coronary artery with unstable angina pectoris: Secondary | ICD-10-CM | POA: Diagnosis not present

## 2022-12-28 MED ORDER — CARVEDILOL 6.25 MG PO TABS: 6.2500 mg | ORAL_TABLET | Freq: Two times a day (BID) | ORAL | 3 refills | Status: DC

## 2022-12-28 NOTE — Progress Notes (Signed)
Cardiology Office Note   Date:  12/28/2022   ID:  [name], DOB 06-Oct-1946, MRN [id number]  PCP:  Jon Billings, NP  Cardiologist:   Kathlyn Sacramento, MD   Chief Complaint  Patient presents with   Other    ABN Stress test no complaints today. Meds reviewed verbally with pt.      History of Present Illness: Juan Le. is a 77 y.o. male who is here today for follow-up visit regarding coronary artery disease and recent abnormal nuclear stress test.    He has known history of coronary artery disease status post CABG in 1997 at [facility].  Most recent cardiac catheterization was in 2008 and showed significant underlying three-vessel coronary artery disease with patent LIMA to LAD, patent SVG to right PDA, patent SVG to OM and patent SVG to diagonal which had a 90% ostial stenosis.  This was treated with PCI and drug-eluting stent placement.  No cardiac events since then. He has chronic medical conditions that include hyperlipidemia, essential hypertension, type 2 diabetes, GERD and obesity. He recently had a prolonged episode of chest tightness that lasted for about 2 hours.  He started having substernal exertional chest pain and significant dyspnea after that.  Most of the chest pain is now completely gone but he continues to have dyspnea with minimal activities. He underwent a [employer] which showed evidence of prior inferior and inferolateral infarct as well as anterolateral ischemia with moderately reduced ejection fraction.   Past Medical History:  Diagnosis Date   Anxiety    Atelectasis of left lung 03/25/2017   Chronic, continuous use of opioids    COPD (chronic obstructive pulmonary disease) (HCC)    Coronary artery disease    Degenerative disc disease, lumbar    Diabetes (HCC)    DJD (degenerative joint disease)    GERD (gastroesophageal reflux disease)    Heart attack ([place])    History of diverticulitis    Hypertension    Hypertension     Hypertriglyceridemia    Hypokalemia    Insomnia    Mitral regurgitation    Sleep apnea    Vitamin D deficiency    Wears dentures    full upper    Past Surgical History:  Procedure Laterality Date   COLONOSCOPY WITH PROPOFOL N/A 01/28/2017   Procedure: COLONOSCOPY WITH PROPOFOL;  Surgeon: [name], MD;  Location: [place];  Service: Endoscopy;  Laterality: N/A;  diabetic - oral meds   COLONOSCOPY WITH PROPOFOL N/A 06/29/2022   Procedure: COLONOSCOPY WITH PROPOFOL;  Surgeon: [name], MD;  Location: [facility] END[facility]COPY;  Service: Endoscopy;  Laterality: N/A;   CORONARY ARTERY BYPASS GRAFT  1997   CORONARY STENT PLACEMENT  11/2006   FOOT SURGERY  2003   HERNIA REPAIR     POLYPECTOMY N/A 01/28/2017   Procedure: POLYPECTOMY;  Surgeon: [name], MD;  Location: [place];  Service: Endoscopy;  Laterality: N/A;   UMBILICAL HERNIA REPAIR  04/2012     Current Outpatient Medications  Medication Sig Dispense Refill   aspirin 81 MG tablet Take 1 tablet by mouth daily.     atorvastatin (LIPITOR) 40 MG tablet Take 1 tab daily 90 tablet 1   Blood Glucose Monitoring Suppl (ONETOUCH VERIO FLEX SYSTEM) w/Device KIT USE UP TO 4 TIMES DAILY AS  DIRECTED 1 kit 0   cloNIDine (CATAPRES) 0.1 MG tablet Take 1 tablet (0.1 mg total) by mouth 2 (two) times daily. 180 tablet 1  dapagliflozin propanediol (FARXIGA) 10 MG TABS tablet Take 1 tablet (10 mg total) by mouth daily before breakfast. 90 tablet 1   Dulaglutide (TRULICITY) A999333 0000000 SOPN Inject 0.75 mg into the skin once a week. 6 mL 1   gabapentin (NEURONTIN) 400 MG capsule Take 4 capsules by mouth daily 540 capsule 1   Lancets (ONETOUCH DELICA PLUS 123XX123) MISC USE AS DIRECTED TO CHECK  BLOOD GLUCOSE ONCE DAILY 100 each 3   metFORMIN (GLUCOPHAGE) 1000 MG tablet Take 1 tablet (1,000 mg total) by mouth 2 (two) times daily with a meal. 180 tablet 4   omeprazole (PRILOSEC) 40 MG capsule Take 1 capsule (40 mg total) by mouth  daily. 90 capsule 4   ONETOUCH VERIO test strip USE AS INSTRUCTED  TO TEST  BLOOD SUGAR TWO TIMES DAILY 200 strip 3   QUEtiapine (SEROQUEL) 200 MG tablet Take 1 tablet (200 mg total) by mouth at bedtime. 90 tablet 3   telmisartan (MICARDIS) 40 MG tablet Take 1 tablet (40 mg total) by mouth daily. 90 tablet 4   No current facility-administered medications for this visit.    Allergies:   Ace inhibitors and Diphenhydramine    Social History:  The patient  reports that he has quit smoking. His smoking use included cigarettes. He has never used smokeless tobacco. He reports that he does not drink alcohol and does not use drugs.   Family History:  The patient's family history includes Arthritis in his brother; Bone cancer in his brother; Breast cancer in his paternal aunt; Cancer in his father; Colon cancer in his father; Depression in his father and sister; Heart disease in his father and mother; Hypertension in his father; Kidney disease in his mother; Kidney failure in his mother; Parkinson's disease in his father; Stroke in his brother, maternal grandmother, and mother; Throat cancer in his father.    ROS:  Please see the history of present illness.   Otherwise, review of systems are positive for none.   All other systems are reviewed and negative.    PHYSICAL EXAM: VS:  BP 120/62 (BP Location: Left Arm, Patient Position: Sitting, Cuff Size: Normal)   Pulse 69   Ht '5\' 10"'$  (1.778 m)   Wt 199 lb 6 oz (90.4 kg)   SpO2 97%   BMI 28.61 kg/m  , BMI Body mass index is 28.61 kg/m. GEN: Well nourished, well developed, in no acute distress  HEENT: normal  Neck: no JVD, carotid bruits, or masses Cardiac: RRR; no murmurs, rubs, or gallops,no edema  Respiratory:  clear to auscultation bilaterally, normal work of breathing GI: soft, nontender, nondistended, + BS MS: no deformity or atrophy  Skin: warm and dry, no rash Neuro:  Strength and sensation are intact Psych: euthymic mood, full  affect Radial pulses normal on both sides.   EKG:  EKG is ordered today. The ekg ordered today demonstrates normal sinus rhythm with first-degree AV block and possible old anterior infarct.  Recent Labs: 05/27/2022: TSH 8.240 10/28/2022: ALT 28; BUN 15; Creatinine, Ser 0.98; Hemoglobin 14.7; Platelets 194; Potassium 4.4; Sodium 140    Lipid Panel    Component Value Date/Time   CHOL 69 (L) 05/27/2022 1008   TRIG 156 (H) 05/27/2022 1008   HDL 30 (L) 05/27/2022 1008   CHOLHDL 2.3 05/27/2022 1008   CHOLHDL 3.9 11/29/2017 0936   VLDL 42 (H) 04/21/2017 0852   LDLCALC 13 05/27/2022 1008   LDLCALC 68 11/29/2017 0936      Wt Readings  from Last 3 Encounters:  12/28/22 199 lb 6 oz (90.4 kg)  12/14/22 204 lb 6 oz (92.7 kg)  12/01/22 202 lb 11.2 oz (91.9 kg)          12/14/2022    2:36 PM  PAD Screen  Previous PAD dx? No  Previous surgical procedure? Yes  Pain with walking? No  Feet/toe relief with dangling? No  Painful, non-healing ulcers? No  Extremities discolored? No      ASSESSMENT AND PLAN:  1.  Coronary artery disease involving native coronary arteries with unstable angina: He had recent prolonged episode of chest tightness that lasted for about 2 hours and now he has significant exertional dyspnea.  Based on his highly abnormal nuclear stress test, I suspect that he closed one of his vein grafts.  He still have significant ischemia in the anterolateral region.  I recommend proceeding with left heart catheterization with coronary and graft angiography and possible endovascular intervention.  I discussed the procedure in details as well as risks and benefits. Planned access is via the left radial artery.  2.  Essential hypertension: Blood pressure is old but I do recommend switching clonidine to carvedilol 6.25 mg twice daily for better antianginal effect and given underlying cardiomyopathy.  3.  Hyperlipidemia: Currently on atorvastatin 40 mg once daily.  Most recent  lipid profile showed an LDL of 13.  4.  Aortic atherosclerosis: No claudication and previous ABI in 2021 was normal.  He has palpable pulses by exam.  Continue treatment of risk factors.    Disposition:   Proceed with left heart catheterization on Friday and follow-up after.  Signed,  Kathlyn Sacramento, MD  12/28/2022 2:43 PM    [place]

## 2022-12-28 NOTE — H&P (View-Only) (Signed)
  Cardiology Office Note   Date:  12/28/2022   ID:  Juan A Glauber Jr., DOB 05/13/1946, MRN [id number]  PCP:  Holdsworth, Karen, NP  Cardiologist:   Laney Bagshaw, MD   Chief Complaint  Patient presents with   Other    ABN Stress test no complaints today. Meds reviewed verbally with pt.      History of Present Illness: Juan A Suhre Jr. is a 76 y.o. male who is here today for follow-up visit regarding coronary artery disease and recent abnormal nuclear stress test.    He has known history of coronary artery disease status post CABG in 1997 at [facility].  Most recent cardiac catheterization was in 2008 and showed significant underlying three-vessel coronary artery disease with patent LIMA to LAD, patent SVG to right PDA, patent SVG to OM and patent SVG to diagonal which had a 90% ostial stenosis.  This was treated with PCI and drug-eluting stent placement.  No cardiac events since then. He has chronic medical conditions that include hyperlipidemia, essential hypertension, type 2 diabetes, GERD and obesity. He recently had a prolonged episode of chest tightness that lasted for about 2 hours.  He started having substernal exertional chest pain and significant dyspnea after that.  Most of the chest pain is now completely gone but he continues to have dyspnea with minimal activities. He underwent a [employer]an Myoview which showed evidence of prior inferior and inferolateral infarct as well as anterolateral ischemia with moderately reduced ejection fraction.   Past Medical History:  Diagnosis Date   Anxiety    Atelectasis of left lung 03/25/2017   Chronic, continuous use of opioids    COPD (chronic obstructive pulmonary disease) (HCC)    Coronary artery disease    Degenerative disc disease, lumbar    Diabetes (HCC)    DJD (degenerative joint disease)    GERD (gastroesophageal reflux disease)    Heart attack (HCC)    History of diverticulitis    Hypertension    Hypertension     Hypertriglyceridemia    Hypokalemia    Insomnia    Mitral regurgitation    Sleep apnea    Vitamin D deficiency    Wears dentures    full upper    Past Surgical History:  Procedure Laterality Date   COLONOSCOPY WITH PROPOFOL N/A 01/28/2017   Procedure: COLONOSCOPY WITH PROPOFOL;  Surgeon: Darren Wohl, MD;  Location: [facility];  Service: Endoscopy;  Laterality: N/A;  diabetic - oral meds   COLONOSCOPY WITH PROPOFOL N/A 06/29/2022   Procedure: COLONOSCOPY WITH PROPOFOL;  Surgeon: Wohl, Darren, MD;  Location: [facility] ENDOSCOPY;  Service: Endoscopy;  Laterality: N/A;   CORONARY ARTERY BYPASS GRAFT  1997   CORONARY STENT PLACEMENT  11/2006   FOOT SURGERY  2003   HERNIA REPAIR     POLYPECTOMY N/A 01/28/2017   Procedure: POLYPECTOMY;  Surgeon: Darren Wohl, MD;  Location: [facility];  Service: Endoscopy;  Laterality: N/A;   UMBILICAL HERNIA REPAIR  04/2012     Current Outpatient Medications  Medication Sig Dispense Refill   aspirin 81 MG tablet Take 1 tablet by mouth daily.     atorvastatin (LIPITOR) 40 MG tablet Take 1 tab daily 90 tablet 1   Blood Glucose Monitoring Suppl (ONETOUCH VERIO FLEX SYSTEM) w/Device KIT USE UP TO 4 TIMES DAILY AS  DIRECTED 1 kit 0   cloNIDine (CATAPRES) 0.1 MG tablet Take 1 tablet (0.1 mg total) by mouth 2 (two) times daily. 180 tablet 1     dapagliflozin propanediol (FARXIGA) 10 MG TABS tablet Take 1 tablet (10 mg total) by mouth daily before breakfast. 90 tablet 1   Dulaglutide (TRULICITY) 0.75 MG/0.5ML SOPN Inject 0.75 mg into the skin once a week. 6 mL 1   gabapentin (NEURONTIN) 400 MG capsule Take 4 capsules by mouth daily 540 capsule 1   Lancets (ONETOUCH DELICA PLUS LANCET33G) MISC USE AS DIRECTED TO CHECK  BLOOD GLUCOSE ONCE DAILY 100 each 3   metFORMIN (GLUCOPHAGE) 1000 MG tablet Take 1 tablet (1,000 mg total) by mouth 2 (two) times daily with a meal. 180 tablet 4   omeprazole (PRILOSEC) 40 MG capsule Take 1 capsule (40 mg total) by mouth  daily. 90 capsule 4   ONETOUCH VERIO test strip USE AS INSTRUCTED  TO TEST  BLOOD SUGAR TWO TIMES DAILY 200 strip 3   QUEtiapine (SEROQUEL) 200 MG tablet Take 1 tablet (200 mg total) by mouth at bedtime. 90 tablet 3   telmisartan (MICARDIS) 40 MG tablet Take 1 tablet (40 mg total) by mouth daily. 90 tablet 4   No current facility-administered medications for this visit.    Allergies:   Ace inhibitors and Diphenhydramine    Social History:  The patient  reports that he has quit smoking. His smoking use included cigarettes. He has never used smokeless tobacco. He reports that he does not drink alcohol and does not use drugs.   Family History:  The patient's family history includes Arthritis in his brother; Bone cancer in his brother; Breast cancer in his paternal aunt; Cancer in his father; Colon cancer in his father; Depression in his father and sister; Heart disease in his father and mother; Hypertension in his father; Kidney disease in his mother; Kidney failure in his mother; Parkinson's disease in his father; Stroke in his brother, maternal grandmother, and mother; Throat cancer in his father.    ROS:  Please see the history of present illness.   Otherwise, review of systems are positive for none.   All other systems are reviewed and negative.    PHYSICAL EXAM: VS:  BP 120/62 (BP Location: Left Arm, Patient Position: Sitting, Cuff Size: Normal)   Pulse 69   Ht 5' 10" (1.778 m)   Wt 199 lb 6 oz (90.4 kg)   SpO2 97%   BMI 28.61 kg/m  , BMI Body mass index is 28.61 kg/m. GEN: Well nourished, well developed, in no acute distress  HEENT: normal  Neck: no JVD, carotid bruits, or masses Cardiac: RRR; no murmurs, rubs, or gallops,no edema  Respiratory:  clear to auscultation bilaterally, normal work of breathing GI: soft, nontender, nondistended, + BS MS: no deformity or atrophy  Skin: warm and dry, no rash Neuro:  Strength and sensation are intact Psych: euthymic mood, full  affect Radial pulses normal on both sides.   EKG:  EKG is ordered today. The ekg ordered today demonstrates normal sinus rhythm with first-degree AV block and possible old anterior infarct.  Recent Labs: 05/27/2022: TSH 8.240 10/28/2022: ALT 28; BUN 15; Creatinine, Ser 0.98; Hemoglobin 14.7; Platelets 194; Potassium 4.4; Sodium 140    Lipid Panel    Component Value Date/Time   CHOL 69 (L) 05/27/2022 1008   TRIG 156 (H) 05/27/2022 1008   HDL 30 (L) 05/27/2022 1008   CHOLHDL 2.3 05/27/2022 1008   CHOLHDL 3.9 11/29/2017 0936   VLDL 42 (H) 04/21/2017 0852   LDLCALC 13 05/27/2022 1008   LDLCALC 68 11/29/2017 0936      Wt Readings   from Last 3 Encounters:  12/28/22 199 lb 6 oz (90.4 kg)  12/14/22 204 lb 6 oz (92.7 kg)  12/01/22 202 lb 11.2 oz (91.9 kg)          12/14/2022    2:36 PM  PAD Screen  Previous PAD dx? No  Previous surgical procedure? Yes  Pain with walking? No  Feet/toe relief with dangling? No  Painful, non-healing ulcers? No  Extremities discolored? No      ASSESSMENT AND PLAN:  1.  Coronary artery disease involving native coronary arteries with unstable angina: He had recent prolonged episode of chest tightness that lasted for about 2 hours and now he has significant exertional dyspnea.  Based on his highly abnormal nuclear stress test, I suspect that he closed one of his vein grafts.  He still have significant ischemia in the anterolateral region.  I recommend proceeding with left heart catheterization with coronary and graft angiography and possible endovascular intervention.  I discussed the procedure in details as well as risks and benefits. Planned access is via the left radial artery.  2.  Essential hypertension: Blood pressure is old but I do recommend switching clonidine to carvedilol 6.25 mg twice daily for better antianginal effect and given underlying cardiomyopathy.  3.  Hyperlipidemia: Currently on atorvastatin 40 mg once daily.  Most recent  lipid profile showed an LDL of 13.  4.  Aortic atherosclerosis: No claudication and previous ABI in 2021 was normal.  He has palpable pulses by exam.  Continue treatment of risk factors.    Disposition:   Proceed with left heart catheterization on Friday and follow-up after.  Signed,  Briley Sulton, MD  12/28/2022 2:43 PM    [facility] 

## 2022-12-28 NOTE — Patient Instructions (Signed)
Medication Instructions:  STOP the Clonidine  START Carvedilol 6. 25 mg twice daily  *If you need a refill on your cardiac medications before your next appointment, please call your pharmacy*   Testing/Procedures: Your physician has requested that you have a cardiac catheterization. Cardiac catheterization is used to diagnose and/or treat various heart conditions. Doctors may recommend this procedure for a number of different reasons. The most common reason is to evaluate chest pain. Chest pain can be a symptom of coronary artery disease (CAD), and cardiac catheterization can show whether plaque is narrowing or blocking your heart's arteries. This procedure is also used to evaluate the valves, as well as measure the blood flow and oxygen levels in different parts of your heart. For further information please visit [email or web address]. Please follow instruction sheet, as given.    Follow-Up: At [facility], you and your health needs are our priority.  As part of our continuing mission to provide you with exceptional heart care, we have created designated Provider Care Teams.  These Care Teams include your primary Cardiologist (physician) and Advanced Practice Providers (APPs -  Physician Assistants and Nurse Practitioners) who all work together to provide you with the care you need, when you need it.  We recommend signing up for the patient portal called "MyChart".  Sign up information is provided on this After Visit Summary.  MyChart is used to connect with patients for Virtual Visits (Telemedicine).  Patients are able to view lab/test results, encounter notes, upcoming appointments, etc.  Non-urgent messages can be sent to your provider as well.   To learn more about what you can do with MyChart, go to [email or web address].    Your next appointment:   1 month(s)  Provider:   You may see Kathlyn Sacramento, MD or one of the following Advanced Practice Providers on your designated Care  Team:   Murray Hodgkins, NP Christell Faith, PA-C Cadence Kathlen Mody, PA-C Gerrie Nordmann, NP    Other Instructions  [facility] [address], [place] [address] Dept: [phone] Loc: [place].  12/28/2022  You are scheduled for a Cardiac Catheterization on Friday, March 15 with Dr. Kathlyn Sacramento.  1. Please arrive at the [place], [place] [place] at 10:30 am(This is 1 hour prior to your procedure time).  Proceed to the Check-In Desk directly inside the entrance.  Procedure Parking: Use the entrance off of the [place] side of the hospital. Turn right upon entering and follow the driveway to parking that is directly in front of the [place]. There is no valet parking available at this entrance, however there is an awning directly in front of the [place] for drop off/ pick up for patients.  Special note: Every effort is made to have your procedure done on time. Please understand that emergencies sometimes delay scheduled procedures.  2. Diet: Do not eat solid foods after midnight.  The patient may have clear liquids until 5am upon the day of the procedure.  3. Labs: You will need to have blood drawn on 12/28/22. You do not need to be fasting.  4. Medication instructions in preparation for your procedure: Hold all diabetic medication the morning of the procedure Hold the Metformin the morning of the procedure and then 48 hours after   On the morning  of your procedure, take your Aspirin 81 mg and any morning medicines NOT listed above.  You may use sips of water.  5. Plan for one night stay--bring personal belongings. 6. Bring a current list of your medications and current insurance cards. 7. You MUST have a responsible person to drive you home. 8. Someone MUST  be with you the first 24 hours after you arrive home or your discharge will be delayed. 9. Please wear clothes that are easy to get on and off and wear slip-on shoes.  Thank you for allowing [place] to care for you!   -- [facility]

## 2022-12-29 ENCOUNTER — Other Ambulatory Visit
Admission: RE | Admit: 2022-12-29 | Discharge: 2022-12-29 | Disposition: A | Discharge status: Home / Self Care | Payer: Medicare Other | Source: Ambulatory Visit | Attending: Cardiovascular Disease | Admitting: Cardiovascular Disease

## 2022-12-29 DIAGNOSIS — I10 Essential (primary) hypertension: Secondary | ICD-10-CM | POA: Diagnosis not present

## 2022-12-29 DIAGNOSIS — E785 Hyperlipidemia, unspecified: Secondary | ICD-10-CM

## 2022-12-29 LAB — CBC
HCT: 44.8 % (ref 39.0–52.0)
Hemoglobin: 15.2 g/dL (ref 13.0–17.0)
MCH: 30.2 pg (ref 26.0–34.0)
MCHC: 33.9 g/dL (ref 30.0–36.0)
MCV: 89.1 fL (ref 80.0–100.0)
Platelets: 216 10*3/uL (ref 150–400)
RBC: 5.03 MIL/uL (ref 4.22–5.81)
RDW: 13.9 % (ref 11.5–15.5)
WBC: 11.2 10*3/uL — ABNORMAL HIGH (ref 4.0–10.5)
nRBC: 0 % (ref 0.0–0.2)

## 2022-12-29 LAB — BASIC METABOLIC PANEL
Anion gap: 10 (ref 5–15)
BUN: 26 mg/dL — ABNORMAL HIGH (ref 8–23)
CO2: 20 mmol/L — ABNORMAL LOW (ref 22–32)
Calcium: 9 mg/dL (ref 8.9–10.3)
Chloride: 102 mmol/L (ref 98–111)
Creatinine, Ser: 1.06 mg/dL (ref 0.61–1.24)
GFR, Estimated: >60 mL/min (ref >60)
Glucose, Bld: 104 mg/dL — ABNORMAL HIGH (ref 70–99)
Potassium: 3.7 mmol/L (ref 3.5–5.1)
Sodium: 132 mmol/L — ABNORMAL LOW (ref 135–145)

## 2022-12-30 ENCOUNTER — Telehealth: Payer: Self-pay | Admitting: Cardiovascular Disease

## 2022-12-30 NOTE — Telephone Encounter (Signed)
Spoke to patient and informed him to please arrive at the [place], [place], [place] at 10:30 am(This is 1 hour prior to your procedure time).  Proceed to the Check-In Desk directly inside the entrance. Patient understood with read back

## 2022-12-30 NOTE — Telephone Encounter (Signed)
Patient calling to see what time he needs to be a the hospital on tomorrow. Please advise

## 2022-12-31 ENCOUNTER — Encounter
Admission: RE | Disposition: A | Discharge status: Home / Self Care | Payer: Self-pay | Source: Home / Self Care | Attending: Cardiovascular Disease

## 2022-12-31 ENCOUNTER — Other Ambulatory Visit: Payer: Self-pay

## 2022-12-31 ENCOUNTER — Ambulatory Visit
Admission: RE | Admit: 2022-12-31 | Discharge: 2022-12-31 | Disposition: A | Discharge status: Home / Self Care | Payer: Medicare Other | Attending: Cardiovascular Disease | Admitting: Cardiovascular Disease

## 2022-12-31 ENCOUNTER — Encounter: Payer: Self-pay | Admitting: Cardiovascular Disease

## 2022-12-31 DIAGNOSIS — Z7985 Long-term (current) use of injectable non-insulin antidiabetic drugs: Secondary | ICD-10-CM | POA: Insufficient documentation

## 2022-12-31 DIAGNOSIS — K219 Gastro-esophageal reflux disease without esophagitis: Secondary | ICD-10-CM | POA: Diagnosis not present

## 2022-12-31 DIAGNOSIS — Z79899 Other long term (current) drug therapy: Secondary | ICD-10-CM | POA: Diagnosis not present

## 2022-12-31 DIAGNOSIS — Z6828 Body mass index (BMI) 28.0-28.9, adult: Secondary | ICD-10-CM | POA: Insufficient documentation

## 2022-12-31 DIAGNOSIS — E669 Obesity, unspecified: Secondary | ICD-10-CM | POA: Diagnosis not present

## 2022-12-31 DIAGNOSIS — I700 Atherosclerosis of aorta: Secondary | ICD-10-CM | POA: Insufficient documentation

## 2022-12-31 DIAGNOSIS — E785 Hyperlipidemia, unspecified: Secondary | ICD-10-CM | POA: Insufficient documentation

## 2022-12-31 DIAGNOSIS — I25110 Atherosclerotic heart disease of native coronary artery with unstable angina pectoris: Secondary | ICD-10-CM | POA: Diagnosis not present

## 2022-12-31 DIAGNOSIS — Z87891 Personal history of nicotine dependence: Secondary | ICD-10-CM | POA: Diagnosis not present

## 2022-12-31 DIAGNOSIS — Z7984 Long term (current) use of oral hypoglycemic drugs: Secondary | ICD-10-CM | POA: Insufficient documentation

## 2022-12-31 DIAGNOSIS — R9439 Abnormal result of other cardiovascular function study: Secondary | ICD-10-CM

## 2022-12-31 DIAGNOSIS — Z951 Presence of aortocoronary bypass graft: Secondary | ICD-10-CM | POA: Insufficient documentation

## 2022-12-31 DIAGNOSIS — I10 Essential (primary) hypertension: Secondary | ICD-10-CM | POA: Diagnosis not present

## 2022-12-31 DIAGNOSIS — E119 Type 2 diabetes mellitus without complications: Secondary | ICD-10-CM | POA: Diagnosis not present

## 2022-12-31 HISTORY — PX: LEFT HEART CATH AND CORONARY ANGIOGRAPHY: CATH118249

## 2022-12-31 LAB — GLUCOSE, CAPILLARY
Glucose-Capillary: 121 mg/dL — ABNORMAL HIGH (ref 70–99)
Glucose-Capillary: 106 mg/dL — ABNORMAL HIGH (ref 70–99)

## 2022-12-31 SURGERY — LEFT HEART CATH AND CORONARY ANGIOGRAPHY
Anesthesia: Moderate Sedation | Laterality: Left

## 2022-12-31 MED ORDER — HEPARIN (PORCINE) IN NACL 2000-0.9 UNIT/L-% IV SOLN
INTRAVENOUS | Status: DC | PRN
  Administered 2022-12-31: 1000 mL

## 2022-12-31 MED ORDER — SODIUM CHLORIDE 0.9 % IV SOLN: INTRAVENOUS | Status: DC

## 2022-12-31 MED ORDER — ACETAMINOPHEN 325 MG PO TABS: 650.0000 mg | ORAL_TABLET | ORAL | Status: DC | PRN

## 2022-12-31 MED ORDER — SODIUM CHLORIDE 0.9% FLUSH: 3.0000 mL | INTRAVENOUS | Status: DC | PRN

## 2022-12-31 MED ORDER — SODIUM CHLORIDE 0.9 % IV SOLN: 250.0000 mL | INTRAVENOUS | Status: DC | PRN

## 2022-12-31 MED ORDER — VERAPAMIL HCL 2.5 MG/ML IV SOLN
INTRAVENOUS | Status: DC | PRN
  Administered 2022-12-31: 2.5 mg via INTRA_ARTERIAL

## 2022-12-31 MED ORDER — ONDANSETRON HCL 4 MG/2ML IJ SOLN: 4.0000 mg | Freq: Four times a day (QID) | INTRAMUSCULAR | Status: DC | PRN

## 2022-12-31 MED ORDER — SODIUM CHLORIDE 0.9% FLUSH: 3.0000 mL | Freq: Two times a day (BID) | INTRAVENOUS | Status: DC

## 2022-12-31 MED ORDER — IOHEXOL 300 MG/ML  SOLN
INTRAMUSCULAR | Status: DC | PRN
  Administered 2022-12-31: 96 mL

## 2022-12-31 MED ORDER — SODIUM CHLORIDE 0.9 % WEIGHT BASED INFUSION: 1.0000 mL/kg/h | INTRAVENOUS | Status: DC

## 2022-12-31 MED ORDER — MIDAZOLAM HCL 2 MG/2ML IJ SOLN
INTRAMUSCULAR | Status: AC
  Filled 2022-12-31: qty 2

## 2022-12-31 MED ORDER — HEPARIN (PORCINE) IN NACL 1000-0.9 UT/500ML-% IV SOLN
INTRAVENOUS | Status: AC
  Filled 2022-12-31: qty 1000

## 2022-12-31 MED ORDER — MIDAZOLAM HCL 2 MG/2ML IJ SOLN
INTRAMUSCULAR | Status: DC | PRN
  Administered 2022-12-31: 1 mg via INTRAVENOUS

## 2022-12-31 MED ORDER — SODIUM CHLORIDE 0.9 % WEIGHT BASED INFUSION
3.0000 mL/kg/h | INTRAVENOUS | Status: AC
  Administered 2022-12-31: 3 mL/kg/h via INTRAVENOUS

## 2022-12-31 MED ORDER — HEPARIN SODIUM (PORCINE) 1000 UNIT/ML IJ SOLN
INTRAMUSCULAR | Status: DC | PRN
  Administered 2022-12-31: 5000 [IU] via INTRAVENOUS

## 2022-12-31 MED ORDER — VERAPAMIL HCL 2.5 MG/ML IV SOLN
INTRAVENOUS | Status: AC
  Filled 2022-12-31: qty 2

## 2022-12-31 MED ORDER — ASPIRIN 81 MG PO CHEW: 81.0000 mg | CHEWABLE_TABLET | ORAL | Status: DC

## 2022-12-31 MED ORDER — FENTANYL CITRATE (PF) 100 MCG/2ML IJ SOLN
INTRAMUSCULAR | Status: AC
  Filled 2022-12-31: qty 2

## 2022-12-31 MED ORDER — FENTANYL CITRATE (PF) 100 MCG/2ML IJ SOLN
INTRAMUSCULAR | Status: DC | PRN
  Administered 2022-12-31: 25 ug via INTRAVENOUS

## 2022-12-31 MED ORDER — HEPARIN SODIUM (PORCINE) 1000 UNIT/ML IJ SOLN
INTRAMUSCULAR | Status: AC
  Filled 2022-12-31: qty 10

## 2022-12-31 SURGICAL SUPPLY — 13 items
CATH 5F 110X4 TIG (CATHETERS) IMPLANT
CATH INFINITI 5FR JL4 (CATHETERS) IMPLANT
CATH INFINITI JR4 5F (CATHETERS) IMPLANT
DEVICE RAD TR BAND REGULAR (VASCULAR PRODUCTS) IMPLANT
DRAPE BRACHIAL (DRAPES) IMPLANT
GLIDESHEATH SLEND SS 6F .021 (SHEATH) IMPLANT
GUIDEWIRE INQWIRE 1.5J.035X260 (WIRE) IMPLANT
INQWIRE 1.5J .035X260CM (WIRE) ×1
KIT SYRINGE INJ CVI SPIKEX1 (MISCELLANEOUS) IMPLANT
PACK CARDIAC CATH (CUSTOM PROCEDURE TRAY) ×1 IMPLANT
PROTECTION STATION PRESSURIZED (MISCELLANEOUS) ×1
SET ATX SIMPLICITY (MISCELLANEOUS) IMPLANT
STATION PROTECTION PRESSURIZED (MISCELLANEOUS) IMPLANT

## 2022-12-31 NOTE — Discharge Instructions (Signed)
Radial Site Care Refer to this sheet in the next few weeks. These instructions provide you with information about caring for yourself after your procedure. Your health care provider may also give you more specific instructions. Your treatment has been planned according to current medical practices, but problems sometimes occur. Call your health care provider if you have any problems or questions after your procedure. What can I expect after the procedure? After your procedure, it is typical to have the following: Bruising at the radial site that usually fades within 1-2 weeks. Blood collecting in the tissue (hematoma) that may be painful to the touch. It should usually decrease in size and tenderness within 1-2 weeks.  Follow these instructions at home: Take medicines only as directed by your health care provider. If you are on a medication called Metformin please do not take for 48 hours after your procedure. Over the next 48hrs please increase your fluid intake of water and non caffeine beverages to flush the contrast dye out of your system.  You may shower 24 hours after the procedure  Leave your bandage on and gently wash the site with plain soap and water. Pat the area dry with a clean towel. Do not rub the site, because this may cause bleeding.  Remove your dressing 48hrs after your procedure and leave open to air.  Do not submerge your site in water for 7 days. This includes swimming and washing dishes.  Check your insertion site every day for redness, swelling, or drainage. Do not apply powder or lotion to the site. Do not flex or bend the affected arm for 24 hours or as directed by your health care provider. Do not push or pull heavy objects with the affected arm for 24 hours or as directed by your health care provider. Do not lift over 10 lb (4.5 kg) for 5 days after your procedure or as directed by your health care provider. Ask your health care provider when it is okay to: Return to  work or school. Resume usual physical activities or sports. Resume sexual activity. Do not drive home if you are discharged the same day as the procedure. Have someone else drive you. You may drive 48 hours after the procedure Do not operate machinery or power tools for 24 hours after the procedure. If your procedure was done as an outpatient procedure, which means that you went home the same day as your procedure, a responsible adult should be with you for the first 24 hours after you arrive home. Keep all follow-up visits as directed by your health care provider. This is important. Contact a health care provider if: You have a fever. You have chills. You have increased bleeding from the radial site. Hold pressure on the site. Get help right away if: You have unusual pain at the radial site. You have redness, warmth, or swelling at the radial site. You have drainage (other than a small amount of blood on the dressing) from the radial site. The radial site is bleeding, and the bleeding does not stop after 15 minutes of holding steady pressure on the site. Your arm or hand becomes pale, cool, tingly, or numb. This information is not intended to replace advice given to you by your health care provider. Make sure you discuss any questions you have with your health care provider. Document Released: 11/06/2010 Document Revised: 03/11/2016 Document Reviewed: 04/22/2014 Elsevier Interactive Patient Education  2018 [employer]  

## 2022-12-31 NOTE — Interval H&P Note (Signed)
History and Physical Interval Note:  12/31/2022 11:58 AM  Juan Le.  has presented today for surgery, with the diagnosis of L Cath    Abnormal stress test.  The various methods of treatment have been discussed with the patient and family. After consideration of risks, benefits and other options for treatment, the patient has consented to  Procedure(s): LEFT HEART CATH AND CORONARY ANGIOGRAPHY (Left) as a surgical intervention.  The patient's history has been reviewed, patient examined, no change in status, stable for surgery.  I have reviewed the patient's chart and labs.  Questions were answered to the patient's satisfaction.     Kathlyn Sacramento

## 2023-01-02 ENCOUNTER — Encounter: Payer: Self-pay | Admitting: Cardiovascular Disease

## 2023-01-12 ENCOUNTER — Other Ambulatory Visit: Payer: Self-pay | Admitting: Nurse Practitioner

## 2023-01-12 DIAGNOSIS — E1142 Type 2 diabetes mellitus with diabetic polyneuropathy: Secondary | ICD-10-CM

## 2023-01-13 NOTE — Telephone Encounter (Signed)
Requested Prescriptions  Pending Prescriptions Disp Refills   gabapentin (NEURONTIN) 400 MG capsule [Pharmacy Med Name: Gabapentin 400 MG Oral Capsule] 300 capsule 2    Sig: TAKE 1 CAPSULE BY MOUTH 3 TIMES  DAILY     Neurology: Anticonvulsants - gabapentin Passed - 01/12/2023 10:37 PM      Passed - Cr in normal range and within 360 days    Creat  Date Value Ref Range Status  11/29/2017 1.03 0.70 - 1.18 mg/dL Final    Comment:    For patients >77 years of age, the reference limit for Creatinine is approximately 13% higher for people identified as African-American. .    Creatinine, Ser  Date Value Ref Range Status  12/29/2022 1.06 0.61 - 1.24 mg/dL Final   Creatinine, Urine  Date Value Ref Range Status  04/21/2017 206 20 - 370 mg/dL Final         Passed - Completed PHQ-2 or PHQ-9 in the last 360 days      Passed - Valid encounter within last 12 months    Recent Outpatient Visits           1 month ago Aortic atherosclerosis ([place])   [place] Jon Billings, NP   2 months ago Aortic atherosclerosis [facility])   [place] Jon Billings, NP   6 months ago Hypertension associated with diabetes [facility])   [place], NP   7 months ago Pre-op evaluation   [place], Karen, NP   10 months ago Hypertension associated with diabetes [facility])   [place] Jon Billings, NP       Future Appointments             In 2 weeks Jon Billings, NP [place]   In 1 month [place], Barbera Setters, NP [place] [facility] [place]

## 2023-01-24 ENCOUNTER — Ambulatory Visit: Payer: Medicare Other | Attending: Cardiovascular Disease

## 2023-01-24 DIAGNOSIS — R0602 Shortness of breath: Secondary | ICD-10-CM

## 2023-01-25 LAB — ECHOCARDIOGRAM COMPLETE
AR max vel: 2.13 cm2
AV Area VTI: 2.11 cm2
AV Area mean vel: 2.13 cm2
AV Mean grad: 6 mmHg
AV Peak grad: 13.2 mmHg
Ao pk vel: 1.82 m/s
Area-P 1/2: 3.21 cm2
S' Lateral: 3 cm

## 2023-01-27 ENCOUNTER — Ambulatory Visit (INDEPENDENT_AMBULATORY_CARE_PROVIDER_SITE_OTHER): Payer: Medicare Other | Admitting: Nurse Practitioner

## 2023-01-27 ENCOUNTER — Ambulatory Visit: Payer: Medicare Other | Admitting: Physician Assistant

## 2023-01-27 ENCOUNTER — Encounter: Payer: Self-pay | Admitting: Nurse Practitioner

## 2023-01-27 VITALS — BP 134/83 | HR 55 | Temp 97.6°F | Ht 70.0 in | Wt 204.2 lb

## 2023-01-27 DIAGNOSIS — E1169 Type 2 diabetes mellitus with other specified complication: Secondary | ICD-10-CM

## 2023-01-27 DIAGNOSIS — J439 Emphysema, unspecified: Secondary | ICD-10-CM

## 2023-01-27 DIAGNOSIS — I152 Hypertension secondary to endocrine disorders: Secondary | ICD-10-CM

## 2023-01-27 DIAGNOSIS — E785 Hyperlipidemia, unspecified: Secondary | ICD-10-CM

## 2023-01-27 DIAGNOSIS — J4489 Other specified chronic obstructive pulmonary disease: Secondary | ICD-10-CM

## 2023-01-27 DIAGNOSIS — L723 Sebaceous cyst: Secondary | ICD-10-CM

## 2023-01-27 DIAGNOSIS — I25110 Atherosclerotic heart disease of native coronary artery with unstable angina pectoris: Secondary | ICD-10-CM

## 2023-01-27 DIAGNOSIS — E1159 Type 2 diabetes mellitus with other circulatory complications: Secondary | ICD-10-CM | POA: Diagnosis not present

## 2023-01-27 DIAGNOSIS — E1142 Type 2 diabetes mellitus with diabetic polyneuropathy: Secondary | ICD-10-CM

## 2023-01-27 DIAGNOSIS — I700 Atherosclerosis of aorta: Secondary | ICD-10-CM

## 2023-01-27 DIAGNOSIS — E669 Obesity, unspecified: Secondary | ICD-10-CM

## 2023-01-27 DIAGNOSIS — Z6833 Body mass index (BMI) 33.0-33.9, adult: Secondary | ICD-10-CM

## 2023-01-27 NOTE — Assessment & Plan Note (Signed)
Chronic. Continue with Statin therapy.

## 2023-01-27 NOTE — Progress Notes (Signed)
BP 134/83   Pulse (!) 55   Temp 97.6 F (36.4 C) (Oral)   Ht 5\' 10"  (1.778 m)   Wt 204 lb 3.2 oz (92.6 kg)   SpO2 98%   BMI 29.30 kg/m    Subjective:    Patient ID: Juan Le., male    DOB: 05/12/1946, 77 y.o.   MRN: [id number]  HPI: Juan Le. is a 77 y.o. male  Chief Complaint  Patient presents with   Neck Pain    X 10 days, mid upper back/neck   Hypertension   Aortic Atherosclerosis   Diabetes   HYPERTENSION / HYPERLIPIDEMIA Doing well.  Denies concerns at visit today.  Satisfied with current treatment? yes Duration of hypertension: years BP monitoring frequency:no BP range:  BP medication side effects: no Past BP meds:  telmisartan and clonidine Duration of hyperlipidemia: years Cholesterol medication side effects: no Cholesterol supplements: none Past cholesterol medications: atorvastain (lipitor) Medication compliance: excellent compliance Aspirin: yes Recent stressors: no Recurrent headaches: no Visual changes: no Palpitations: no Dyspnea: with excretion Chest pain: no Lower extremity edema: no Dizzy/lightheaded: no  DIABETES Now taking [place] and Trulicity.  Denies concerns regarding medication at visit today.   Hypoglycemic episodes:no Polydipsia/polyuria: no Visual disturbance: no Chest pain: no Paresthesias: yes Glucose Monitoring: sometimes  Accucheck frequency: 130  Fasting glucose:  Post prandial:  Evening:  Before meals: Taking Insulin?: no  Long acting insulin:  Short acting insulin: Blood Pressure Monitoring: sometimes Retinal Examination: Up to Date Foot Exam: Up to Date Diabetic Education: Not Completed Pneumovax: Up to Date Influenza: Up to Date Aspirin: yes  COPD Feels like he is doing well without an inhaler.  COPD status: controlled Satisfied with current treatment?: yes Oxygen use: no Dyspnea frequency: none Cough frequency:  Rescue inhaler frequency:  none Limitation of activity:  yes Productive cough:  Last Spirometry: 03/04/2021 Pneumovax: Up to Date Influenza: Up to Date  SLEEP APNEA Doesn't even have a CPAP machine.  Not interested in getting another machine or doing another sleep study.  Sleep apnea status: uncontrolled Duration: chronic Satisfied with current treatment?:  no CPAP use:  no- didn't like it Sleep quality with CPAP use: good Treament compliance:poor compliance Last sleep study:  Treatments attempted: CPAP Wakes feeling refreshed:  yes Daytime hypersomnolence:  no Fatigue:  no Insomnia:  no Good sleep hygiene:  no Difficulty falling asleep:  no Difficulty staying asleep:  no Snoring bothers bed partner:   unknown Observed apnea by bed partner: no Obesity:  yes Hypertension: yes  Pulmonary hypertension:  no Coronary artery disease:  yes  NEUROPATHY Patient states his neuropathy is about the same.  His feet stay numb and every once in awhile they burn.  He takes two gabapentin in the morning and two at bedtime.  NECK PAIN FOLLOW UP Status: uncontrolled Treatments attempted:  Gabapentin helps it some but doesn't get rid of it   Compliant with recommended treatment: yes Relief with NSAIDs?:  No NSAIDs Taken Location:midline Duration: 10 days Severity: 7/10 Quality: sharp Frequency: constant Radiation: L arm Aggravating factors:  stays the same no matter what he is doing Alleviating factors:  Gabapentin and rest Weakness:  no Paresthesias / decreased sensation:  no  Fevers:  no    Relevant past medical, surgical, family and social history reviewed and updated as indicated. Interim medical history since our last visit reviewed. Allergies and medications reviewed and updated.  Review of Systems  Eyes:  Negative for visual  disturbance.  Respiratory:  Negative for chest tightness and shortness of breath.   Cardiovascular:  Negative for chest pain, palpitations and leg swelling.  Endocrine: Negative for polydipsia and polyuria.   Musculoskeletal:  Positive for neck pain.  Neurological:  Positive for numbness. Negative for dizziness, light-headedness and headaches.    Per HPI unless specifically indicated above     Objective:    BP 134/83   Pulse (!) 55   Temp 97.6 F (36.4 C) (Oral)   Ht 5\' 10"  (1.778 m)   Wt 204 lb 3.2 oz (92.6 kg)   SpO2 98%   BMI 29.30 kg/m   Wt Readings from Last 3 Encounters:  01/27/23 204 lb 3.2 oz (92.6 kg)  12/31/22 200 lb (90.7 kg)  12/28/22 199 lb 6 oz (90.4 kg)    Physical Exam Vitals and nursing note reviewed.  Constitutional:      General: He is not in acute distress.    Appearance: Normal appearance. He is obese. He is not ill-appearing, toxic-appearing or diaphoretic.  HENT:     Head: Normocephalic.     Right Ear: External ear normal.     Left Ear: External ear normal.     Nose: Nose normal. No congestion or rhinorrhea.     Mouth/Throat:     Mouth: Mucous membranes are moist.  Eyes:     General:        Right eye: No discharge.        Left eye: No discharge.     Extraocular Movements: Extraocular movements intact.     Conjunctiva/sclera: Conjunctivae normal.     Pupils: Pupils are equal, round, and reactive to light.  Cardiovascular:     Rate and Rhythm: Normal rate and regular rhythm.     Heart sounds: No murmur heard. Pulmonary:     Effort: Pulmonary effort is normal. No respiratory distress.     Breath sounds: Normal breath sounds. No wheezing, rhonchi or rales.  Abdominal:     General: Abdomen is flat. Bowel sounds are normal.  Musculoskeletal:     Cervical back: Normal range of motion and neck supple.  Feet:     Right foot:     Protective Sensation: 10 sites tested.  8 sites sensed.     Skin integrity: Callus and dry skin present. No skin breakdown, erythema or warmth.     Toenail Condition: Right toenails are abnormally thick and long. Fungal disease present.    Left foot:     Protective Sensation: 10 sites tested.  8 sites sensed.     Skin  integrity: Callus and dry skin present. No skin breakdown, erythema or warmth.     Toenail Condition: Left toenails are abnormally thick and long.  Skin:    General: Skin is warm and dry.     Capillary Refill: Capillary refill takes less than 2 seconds.       Neurological:     General: No focal deficit present.     Mental Status: He is alert and oriented to person, place, and time.  Psychiatric:        Mood and Affect: Mood normal.        Behavior: Behavior normal.        Thought Content: Thought content normal.        Judgment: Judgment normal.     Results for orders placed or performed in visit on 01/24/23  ECHOCARDIOGRAM COMPLETE  Result Value Ref Range   AR max vel 2.13 cm2  AV Peak grad 13.2 mmHg   Ao pk vel 1.82 m/s   S' Lateral 3.00 cm   Area-P 1/2 3.21 cm2   AV Area VTI 2.11 cm2   AV Mean grad 6.0 mmHg   AV Area mean vel 2.13 cm2   Est EF 55 - 60%       Assessment & Plan:   Problem List Items Addressed This Visit       Cardiovascular and Mediastinum   Hypertension associated with diabetes - Primary    Chronic.  Controlled.  Continue with current medication regimen of telmisartan and Clonidine. Labs ordered today. Will make recommendations based on lab results.       Relevant Orders   Comp Met (CMET)   Coronary artery disease involving native coronary artery of native heart with unstable angina pectoris    Chronic.  Continue with Statin therapy.        Aortic atherosclerosis    Chronic. Continue with Statin therapy.        Respiratory   COPD with chronic bronchitis and emphysema    Chronic.  Controlled.  Continue with current medication regimen.  Denies concerns about breathing today. Return to clinic in 3 months for reevaluation.  Call sooner if concerns arise.          Endocrine   Type 2 diabetes mellitus with peripheral neuropathy    Chronic.  Controlled.  Continue with current medication regimen of Gabapentin.  Continue with [place]Farxiga and  Trulicity.  Labs ordered today.  A1c has been well controlled at 5.7%.   Return to clinic in 3 months for reevaluation.  Call sooner if concerns arise.       Relevant Orders   HgB A1c   Hyperlipidemia associated with type 2 diabetes mellitus    Chronic. Controlled.  Continue with current medication regimen of Atorvastatin 40mg  daily.  Return in 3 months.  Call sooner if concerns arise.        Other   Obesity    Recommended eating smaller high protein, low fat meals more frequently and exercising 30 mins a day 5 times a week with a goal of 10-15lb weight loss in the next 3 months.       Other Visit Diagnoses     Sebaceous cyst       Referral placed for patient to see general surgery for evaluation of cyst. Pain is around cyst.   Relevant Orders   Ambulatory referral to General Surgery        Follow up plan: Return in about 3 months (around 04/28/2023) for HTN, HLD, DM2 FU.

## 2023-01-27 NOTE — Assessment & Plan Note (Signed)
Recommended eating smaller high protein, low fat meals more frequently and exercising 30 mins a day 5 times a week with a goal of 10-15lb weight loss in the next 3 months.  

## 2023-01-27 NOTE — Assessment & Plan Note (Signed)
Chronic. Controlled.  Continue with current medication regimen of Atorvastatin 40mg daily.  Return in 3 months.  Call sooner if concerns arise. 

## 2023-01-27 NOTE — Assessment & Plan Note (Signed)
Chronic.  Controlled.  Continue with current medication regimen of Gabapentin.  Continue with [place] and Trulicity.  Labs ordered today.  A1c has been well controlled at 5.7%.   Return to clinic in 3 months for reevaluation.  Call sooner if concerns arise.

## 2023-01-27 NOTE — Assessment & Plan Note (Signed)
Chronic.  Controlled.  Continue with current medication regimen.  Denies concerns about breathing today. Return to clinic in 3 months for reevaluation.  Call sooner if concerns arise.   

## 2023-01-27 NOTE — Assessment & Plan Note (Signed)
Chronic. Continue with Statin therapy. 

## 2023-01-27 NOTE — Assessment & Plan Note (Signed)
Chronic.  Controlled.  Continue with current medication regimen of telmisartan and Clonidine. Labs ordered today. Will make recommendations based on lab results.

## 2023-01-31 ENCOUNTER — Other Ambulatory Visit: Payer: Medicare Other

## 2023-01-31 DIAGNOSIS — I152 Hypertension secondary to endocrine disorders: Secondary | ICD-10-CM | POA: Diagnosis not present

## 2023-01-31 DIAGNOSIS — E1159 Type 2 diabetes mellitus with other circulatory complications: Secondary | ICD-10-CM

## 2023-01-31 DIAGNOSIS — E1142 Type 2 diabetes mellitus with diabetic polyneuropathy: Secondary | ICD-10-CM

## 2023-01-31 NOTE — Progress Notes (Unsigned)
Patient ID: [name], male   DOB: [birth year], 77 y.o.   MRN: [id number]  Chief Complaint: Neck cyst  History of Present Illness Juan Le. is a 77 y.o. male with a painful cyst on the back of his neck for many years.  Prior incision and drainage a few years ago.  Now with increased pain over the last 2 weeks.  Past Medical History Past Medical History:  Diagnosis Date   Anxiety    Atelectasis of left lung 03/25/2017   Chronic, continuous use of opioids    COPD (chronic obstructive pulmonary disease)    Coronary artery disease    Degenerative disc disease, lumbar    Diabetes    DJD (degenerative joint disease)    GERD (gastroesophageal reflux disease)    Heart attack    History of diverticulitis    Hypertension    Hypertension    Hypertriglyceridemia    Hypokalemia    Insomnia    Mitral regurgitation    Sleep apnea    Vitamin D deficiency    Wears dentures    full upper      Past Surgical History:  Procedure Laterality Date   COLONOSCOPY WITH PROPOFOL N/A 01/28/2017   Procedure: COLONOSCOPY WITH PROPOFOL;  Surgeon: Midge Minium, MD;  Location: [facility];  Service: Endoscopy;  Laterality: N/A;  diabetic - oral meds   COLONOSCOPY WITH PROPOFOL N/A 06/29/2022   Procedure: COLONOSCOPY WITH PROPOFOL;  Surgeon: Midge Minium, MD;  Location: [facility];  Service: Endoscopy;  Laterality: N/A;   CORONARY ARTERY BYPASS GRAFT  1997   CORONARY STENT PLACEMENT  11/2006   FOOT SURGERY  2003   HERNIA REPAIR     LEFT HEART CATH AND CORONARY ANGIOGRAPHY Left 12/31/2022   Procedure: LEFT HEART CATH AND CORONARY ANGIOGRAPHY;  Surgeon: Iran Ouch, MD;  Location: [facility];  Service: Cardiovascular;  Laterality: Left;   POLYPECTOMY N/A 01/28/2017   Procedure: POLYPECTOMY;  Surgeon: Midge Minium, MD;  Location[facility];  Service: Endoscopy;  Laterality: N/A;   UMBILICAL HERNIA REPAIR  04/2012    Allergies  Allergen Reactions    Ace Inhibitors Cough   Diphenhydramine Rash    stomach upset    Current Outpatient Medications  Medication Sig Dispense Refill   aspirin 81 MG tablet Take 1 tablet by mouth daily.     atorvastatin (LIPITOR) 40 MG tablet Take 1 tab daily 90 tablet 1   Blood Glucose Monitoring Suppl (ONETOUCH VERIO FLEX SYSTEM) w/Device KIT USE UP TO 4 TIMES DAILY AS  DIRECTED 1 kit 0   carvedilol (COREG) 6.25 MG tablet Take 1 tablet (6.25 mg total) by mouth 2 (two) times daily. 180 tablet 3   dapagliflozin propanediol (FARXIGA) 10 MG TABS tablet Take 1 tablet (10 mg total) by mouth daily before breakfast. 90 tablet 1   Dulaglutide (TRULICITY) 0.75 MG/0.5ML SOPN Inject 0.75 mg into the skin once a week. 6 mL 1   gabapentin (NEURONTIN) 400 MG capsule TAKE 1 CAPSULE BY MOUTH 3 TIMES  DAILY 300 capsule 2   Lancets (ONETOUCH DELICA PLUS LANCET33G) MISC USE AS DIRECTED TO CHECK  BLOOD GLUCOSE ONCE DAILY 100 each 3   metFORMIN (GLUCOPHAGE) 1000 MG tablet Take 1 tablet (1,000 mg total) by mouth 2 (two) times daily with a meal. 180 tablet 4   omeprazole (PRILOSEC) 40 MG capsule Take 1 capsule (40 mg total) by mouth daily. 90 capsule 4   ONETOUCH VERIO test  strip USE AS INSTRUCTED  TO TEST  BLOOD SUGAR TWO TIMES DAILY 200 strip 3   QUEtiapine (SEROQUEL) 200 MG tablet Take 1 tablet (200 mg total) by mouth at bedtime. 90 tablet 3   telmisartan (MICARDIS) 40 MG tablet Take 1 tablet (40 mg total) by mouth daily. 90 tablet 4   No current facility-administered medications for this visit.    Family History Family History  Problem Relation Age of Onset   Throat cancer Father    Colon cancer Father    Heart disease Father    Cancer Father    Depression Father    Hypertension Father    Parkinson's disease Father    Arthritis Brother    Stroke Mother    Kidney disease Mother    Heart disease Mother    Kidney failure Mother    Breast cancer Paternal Aunt    Depression Sister    Stroke Maternal Grandmother    Bone  cancer Brother    Stroke Brother    Prostate cancer Neg Hx    Kidney cancer Neg Hx    Bladder Cancer Neg Hx       Social History Social History   Tobacco Use   Smoking status: Former    Types: Cigarettes    Quit date: 10/19/1955    Years since quitting: 67.3    Passive exposure: Past   Smokeless tobacco: Never   Tobacco comments:    former light smoker; quit in his teens  Vaping Use   Vaping Use: Never used  Substance Use Topics   Alcohol use: No    Alcohol/week: 0.0 standard drinks of alcohol   Drug use: No        Review of Systems  Constitutional: Negative.   HENT: Negative.    Eyes: Negative.   Respiratory: Negative.    Cardiovascular: Negative.   Gastrointestinal: Negative.   Genitourinary: Negative.   Skin: Negative.   Neurological: Negative.   Psychiatric/Behavioral: Negative.       Physical Exam Blood pressure 133/79, pulse (!) 56, temperature 98.4 F (36.9 C), height  (1.778 m), weight 204 lb (92.5 kg), SpO2 99 %. Last Weight  Most recent update: 02/01/2023  8:48 AM    Weight  92.5 kg (204 lb)             CONSTITUTIONAL: Well developed, and nourished, appropriately responsive and aware without distress. ***  EYES: Sclera non-icteric.   EARS, NOSE, MOUTH AND THROAT: Mask worn.  *** The oropharynx is clear. Oral mucosa is pink and moist.  Dentition: ***   Hearing is intact to voice.  NECK: Trachea is midline, and there is no jugular venous distension.  LYMPH NODES:  Lymph nodes in the neck are not appreciated. RESPIRATORY:  Lungs are clear, and breath sounds are equal bilaterally. *** Normal respiratory effort without pathologic use of accessory muscles. CARDIOVASCULAR: Heart is regular in rate and rhythm.  *** Well perfused.  GI: The abdomen is *** soft, nontender, and nondistended. There were no palpable masses. *** I did not appreciate hepatosplenomegaly. There were normal bowel sounds.  *** GU: *** MUSCULOSKELETAL:  Symmetrical muscle tone  appreciated in all four extremities.    SKIN: Skin turgor is normal. No pathologic skin lesions appreciated.  NEUROLOGIC:  Motor and sensation appear grossly normal.  Cranial nerves are grossly without defect. PSYCH:  Alert and oriented to person, place and time. Affect is appropriate for situation.  Data Reviewed I have personally reviewed what is  currently available of the patient's imaging, recent labs and medical records.   Labs:     Latest Ref Rng & Units 12/29/2022   10:03 AM 10/28/2022   10:30 AM 05/27/2022   10:08 AM  CBC  WBC 4.0 - 10.5 K/uL 11.2  7.4  6.7   Hemoglobin 13.0 - 17.0 g/dL 67.1  24.5  80.9   Hematocrit 39.0 - 52.0 % 44.8  43.9  42.7   Platelets 150 - 400 K/uL 216  194  189       Latest Ref Rng & Units 01/31/2023    1:32 PM 12/29/2022   10:03 AM 10/28/2022   10:30 AM  CMP  Glucose 70 - 99 mg/dL 983  382  505   BUN 8 - 27 mg/dL 13  26  15    Creatinine 0.76 - 1.27 mg/dL 3.97  6.73  4.19   Sodium 134 - 144 mmol/L 140  132  140   Potassium 3.5 - 5.2 mmol/L 4.3  3.7  4.4   Chloride 96 - 106 mmol/L 101  102  101   CO2 20 - 29 mmol/L 19  20  20    Calcium 8.6 - 10.2 mg/dL 9.5  9.0  9.5   Total Protein 6.0 - 8.5 g/dL 7.2   7.0   Total Bilirubin 0.0 - 1.2 mg/dL 0.4   0.3   Alkaline Phos 44 - 121 IU/L 61   65   AST 0 - 40 IU/L 27   24   ALT 0 - 44 IU/L 35   28    *** {Labs :18171}  Imaging: Radiological images reviewed:  *** Within last 24 hrs: No results found.  Assessment    *** Patient Active Problem List   Diagnosis Date Noted   Abnormal stress test 12/31/2022   History of colonic polyps    Polyp of transverse colon    Vitamin B12 deficiency anemia 02/04/2021   Aortic atherosclerosis 08/31/2020   COPD with chronic bronchitis and emphysema 03/08/2019   Obesity 06/12/2018   History of acute myocardial infarction 03/10/2018   Obstructive sleep apnea 03/10/2018   Hx of colonic polyps    Benign neoplasm of ascending colon    Coronary artery disease  involving native coronary artery of native heart with unstable angina pectoris 01/20/2017   DDD (degenerative disc disease), lumbar 01/20/2017   Cervical spinal stenosis 01/20/2017   MI (mitral incompetence) 01/20/2017   Chronic neck and back pain 08/13/2015   GERD (gastroesophageal reflux disease) 06/12/2015   Hypertension associated with diabetes 06/12/2015   Type 2 diabetes mellitus with peripheral neuropathy 04/16/2015   Hyperlipidemia associated with type 2 diabetes mellitus 04/16/2015   Insomnia 04/16/2015   H/O coronary artery bypass surgery 04/16/2015   DDD (degenerative disc disease), cervical 04/11/2015   H/O adenomatous polyp of colon 06/10/2011    Plan    ***  Face-to-face time spent with the patient and accompanying care providers(if present) was *** minutes, with more than 50% of the time spent counseling, educating, and coordinating care of the patient.    These notes generated with voice recognition software. I apologize for typographical errors.  Campbell Lerner M.D., FACS 02/01/2023, 12:29 PM

## 2023-02-01 ENCOUNTER — Ambulatory Visit (INDEPENDENT_AMBULATORY_CARE_PROVIDER_SITE_OTHER): Payer: Medicare Other | Admitting: Surgery

## 2023-02-01 ENCOUNTER — Encounter: Payer: Self-pay | Admitting: Surgery

## 2023-02-01 VITALS — BP 133/79 | HR 56 | Temp 98.4°F | Ht 70.0 in | Wt 204.0 lb

## 2023-02-01 DIAGNOSIS — L723 Sebaceous cyst: Secondary | ICD-10-CM

## 2023-02-01 LAB — COMPREHENSIVE METABOLIC PANEL
ALT: 35 IU/L (ref 0–44)
AST: 27 IU/L (ref 0–40)
Albumin/Globulin Ratio: 1.7 (ref 1.2–2.2)
Albumin: 4.5 g/dL (ref 3.8–4.8)
Alkaline Phosphatase: 61 IU/L (ref 44–121)
BUN/Creatinine Ratio: 12 (ref 10–24)
BUN: 13 mg/dL (ref 8–27)
Bilirubin Total: 0.4 mg/dL (ref 0.0–1.2)
CO2: 19 mmol/L — ABNORMAL LOW (ref 20–29)
Calcium: 9.5 mg/dL (ref 8.6–10.2)
Chloride: 101 mmol/L (ref 96–106)
Creatinine, Ser: 1.07 mg/dL (ref 0.76–1.27)
Globulin, Total: 2.7 g/dL (ref 1.5–4.5)
Glucose: 152 mg/dL — ABNORMAL HIGH (ref 70–99)
Potassium: 4.3 mmol/L (ref 3.5–5.2)
Sodium: 140 mmol/L (ref 134–144)
Total Protein: 7.2 g/dL (ref 6.0–8.5)
eGFR: 72 mL/min/{1.73_m2} (ref >59)

## 2023-02-01 LAB — HEMOGLOBIN A1C
Est. average glucose Bld gHb Est-mCnc: 120 mg/dL
Hgb A1c MFr Bld: 5.8 % — ABNORMAL HIGH (ref 4.8–5.6)

## 2023-02-01 NOTE — Patient Instructions (Signed)
We have removed a Cyst in our office today.  You have sutures under the skin that will dissolve and also dermabond (skin glue) on top of your skin which will come off on it's own in 10-14 days.  You may use Ibuprofen or Tylenol as needed for pain control every 6 hours. Use the ice pack 3-4 times a day for the next two days for any achiness.  You may shower tomorrow evening, do not scrub at the area.   Avoid Strenuous activities that will make you sweat during the next 48 hours to avoid the glue coming off prematurely. Avoid activities that will place pressure to this area of the body for 1-2 weeks to avoid re-injury to incision site.  Please see your follow-up appointment provided. We will see you back in office to make sure this area is healed and to review the final pathology. If you have any questions or concerns prior to this appointment, call our office and speak with a nurse.    Excision of Skin Cysts or Lesions Excision of a skin lesion refers to the removal of a section of skin by making small cuts (incisions) in the skin. This procedure may be done to remove a cancerous (malignant) or noncancerous (benign) growth on the skin. It is typically done to treat or prevent cancer or infection. It may also be done to improve cosmetic appearance. The procedure may be done to remove: Cancerous growths, such as basal cell carcinoma, squamous cell carcinoma, or melanoma. Noncancerous growths, such as a cyst or lipoma. Growths, such as moles or skin tags, which may be removed for cosmetic reasons.  Various excision or surgical techniques may be used depending on your condition, the location of the lesion, and your overall health. Tell a health care provider about: Any allergies you have. All medicines you are taking, including vitamins, herbs, eye drops, creams, and over-the-counter medicines. Any problems you or family members have had with anesthetic medicines. Any blood disorders you  have. Any surgeries you have had. Any medical conditions you have. Whether you are pregnant or may be pregnant. What are the risks? Generally, this is a safe procedure. However, problems may occur, including: Bleeding. Infection. Scarring. Recurrence of the cyst, lipoma, or cancer. Changes in skin sensation or appearance, such as discoloration or swelling. Reaction to the anesthetics. Allergic reaction to surgical materials or ointments. Damage to nerves, blood vessels, muscles, or other structures. Continued pain.  What happens before the procedure? Ask your health care provider about: Changing or stopping your regular medicines. This is especially important if you are taking diabetes medicines or blood thinners. Taking medicines such as aspirin and ibuprofen. These medicines can thin your blood. Do not take these medicines before your procedure if your health care provider instructs you not to. You may be asked to take certain medicines. You may be asked to stop smoking. You may have an exam or testing. What happens during the procedure? To reduce your risk of infection: Your health care team will wash or sanitize their hands. Your skin will be washed with soap. You will be given a medicine to numb the area (local anesthetic). One of the following excision techniques will be performed. At the end of any of these procedures, antibiotic ointment will be applied as needed. Each of the following techniques may vary among health care providers and hospitals. Complete Surgical Excision The area of skin that needs to be removed will be marked with a pen. Using a small  scalpel or scissors, the surgeon will gently cut around and under the lesion until it is completely removed. The lesion will be placed in a fluid and sent to the lab for examination. If necessary, bleeding will be controlled with a device that delivers heat (electrocautery). The edges of the wound may be stitched (sutured)  together, and a bandage (dressing) will be applied. This procedure may be performed to treat a cancerous growth or a noncancerous cyst or lesion. Excision of a Cyst The surgeon will make an incision on the cyst. The entire cyst will be removed through the incision. The incision may be closed with sutures.  What happens after the procedure? Return to your normal activities as told by your health care provider.

## 2023-02-01 NOTE — Progress Notes (Signed)
Please let patient know that his lab work looks good.  No concerns at this time.  A1c is well controlled at 5.8%.

## 2023-02-10 ENCOUNTER — Ambulatory Visit (INDEPENDENT_AMBULATORY_CARE_PROVIDER_SITE_OTHER): Payer: Medicare Other | Admitting: Physician Assistant

## 2023-02-10 ENCOUNTER — Encounter: Payer: Self-pay | Admitting: Physician Assistant

## 2023-02-10 VITALS — BP 145/79 | HR 60 | Temp 98.0°F | Ht 70.0 in | Wt 207.0 lb

## 2023-02-10 DIAGNOSIS — Z09 Encounter for follow-up examination after completed treatment for conditions other than malignant neoplasm: Secondary | ICD-10-CM

## 2023-02-10 DIAGNOSIS — L723 Sebaceous cyst: Secondary | ICD-10-CM

## 2023-02-10 NOTE — Patient Instructions (Signed)
GENERAL POST-OPERATIVE PATIENT INSTRUCTIONS   WOUND CARE INSTRUCTIONS:  Keep a dry clean dressing on the wound if there is drainage. The initial bandage may be removed after 24 hours.  Once the wound has quit draining you may leave it open to air.  If clothing rubs against the wound or causes irritation and the wound is not draining you may cover it with a dry dressing during the daytime.  Try to keep the wound dry and avoid ointments on the wound unless directed to do so.  If the wound becomes bright red and painful or starts to drain infected material that is not clear, please contact your physician immediately.  If the wound is mildly pink and has a thick firm ridge underneath it, this is normal, and is referred to as a healing ridge.  This will resolve over the next 4-6 weeks.  BATHING: You may shower if you have been informed of this by your surgeon. However, Please do not submerge in a tub, hot tub, or pool until incisions are completely sealed or have been told by your surgeon that you may do so.  DIET:  You may eat any foods that you can tolerate.  It is a good idea to eat a high fiber diet and take in plenty of fluids to prevent constipation.  If you do become constipated you may want to take a mild laxative or take ducolax tablets on a daily basis until your bowel habits are regular.  Constipation can be very uncomfortable, along with straining, after recent surgery.  MEDICATIONS:  Try to take narcotic medications and anti-inflammatory medications, such as tylenol, ibuprofen, naprosyn, etc., with food.  This will minimize stomach upset from the medication.  Should you develop nausea and vomiting from the pain medication, or develop a rash, please discontinue the medication and contact your physician.  You should not drive, make important decisions, or operate machinery when taking narcotic pain medication.  SUNBLOCK Use sun block to incision area over the next year if this area will be exposed  to sun. This helps decrease scarring and will allow you avoid a permanent darkened area over your incision.  QUESTIONS:  Please feel free to call our office if you have any questions, and we will be glad to assist you. [phone]

## 2023-02-10 NOTE — Progress Notes (Signed)
[facility] POST-OP OFFICE VISIT  02/10/2023  HPI: Juan Le. is a 77 y.o. male 9 days s/p excision upper back/lower neck sebaceous cyst (4.2 cm) with Dr Juan Le  He is doing very well; very thankful of Dr Juan Le work/time No pain No fever, chills Wound has healed well without swelling, erythema, nor drainage No other complaints   Vital signs: BP (!) 145/79   Pulse 60   Temp 98 F (36.7 C)   Ht  (1.778 m)   Wt 207 lb (93.9 kg)   SpO2 98%   BMI 29.70 kg/m    Physical Exam: Constitutional: Well appearing male, NAD Skin: ~5 cm incision to the posterior upper back/lower neck; this is well healed; no erythema nor drainage   Assessment/Plan: This is a 77 y.o. male 9 days s/p excision upper back/lower neck sebaceous cyst (4.2 cm) with Dr Juan Le   - Pain control prn  - Reviewed wound care recommendation  - He can follow up on as needed basis; He understands to call with questions/concerns  -- Juan Oxford, PA-C [facility] 02/10/2023, 1:42 PM M-F: 7am - 4pm

## 2023-02-14 ENCOUNTER — Ambulatory Visit: Payer: Medicare Other | Admitting: Cardiology

## 2023-02-15 ENCOUNTER — Encounter: Payer: Self-pay | Admitting: Cardiology

## 2023-02-15 ENCOUNTER — Ambulatory Visit: Payer: Medicare Other | Attending: Cardiology | Admitting: Cardiology

## 2023-02-15 VITALS — BP 114/58 | HR 61 | Ht 70.0 in | Wt 208.6 lb

## 2023-02-15 DIAGNOSIS — I700 Atherosclerosis of aorta: Secondary | ICD-10-CM | POA: Diagnosis not present

## 2023-02-15 DIAGNOSIS — E785 Hyperlipidemia, unspecified: Secondary | ICD-10-CM

## 2023-02-15 DIAGNOSIS — I10 Essential (primary) hypertension: Secondary | ICD-10-CM

## 2023-02-15 DIAGNOSIS — H2513 Age-related nuclear cataract, bilateral: Secondary | ICD-10-CM | POA: Diagnosis not present

## 2023-02-15 DIAGNOSIS — I25110 Atherosclerotic heart disease of native coronary artery with unstable angina pectoris: Secondary | ICD-10-CM

## 2023-02-15 DIAGNOSIS — H353131 Nonexudative age-related macular degeneration, bilateral, early dry stage: Secondary | ICD-10-CM | POA: Diagnosis not present

## 2023-02-15 DIAGNOSIS — E119 Type 2 diabetes mellitus without complications: Secondary | ICD-10-CM | POA: Diagnosis not present

## 2023-02-15 DIAGNOSIS — E1142 Type 2 diabetes mellitus with diabetic polyneuropathy: Secondary | ICD-10-CM | POA: Diagnosis not present

## 2023-02-15 LAB — HM DIABETES EYE EXAM

## 2023-02-15 NOTE — Progress Notes (Signed)
Cardiology Office Note:   Date:  02/15/2023  ID:  Juan Coots., DOB 1946/06/14, MRN [id number]  History of Present Illness:   Juan Dogan. is a 77 y.o. male with a past medical history of coronary artery disease status post CABG in 1997 at [facility], essential hypertension, hyperlipidemia, type 2 diabetes, gastroesophageal reflux disease, and obesity, who is here today for follow up.  Noted coronary artery disease status post CABG 1997 at [facility].  Cardiac catheterization 201.  She has significant underlying three-vessel coronary artery disease with patent LIMA to LAD, patent saphenous to right PDA, patent saphenous to OM and patent saphenous today, which had 90% ostial stenosis.  This was treated with PCI DES placement.  No cardiac events since that time.    He was last seen in clinic 12/28/2022 by Dr.Arida. He had continued to have some episodes of chest pain and dyspnea with minimal activities and underwent [facility] which showed evidence of prior inferior and inferolateral infarct as well as an anterior lateral ischemia with moderately reduced ejection fraction.  Left heart catheterization was completed on 12/31/2022 which shows severe native three-vessel coronary disease with 3 out of 4 occluded grafts.  All SVGs were occluded daily patent grafts the LIMA to the LAD.  The native right coronary artery had moderate proximal disease with occluded distal LAD.  Well-developed left-to-right collaterals were noted.  Rest of the OM branches were also occluded with right to left collaterals from the RV marginal branch.  He was recommended for medical treatment as there were no good targets for PCI of the native vessels and no good targets for redo CABG.  He returns clinic today accompanied by his wife.  He states that he has been doing well since his heart catheterization.  Denies any chest pain shortness of breath.  States that he has tolerated the medication well as clonidine was changed to  carvedilol.  Continues to be compliant with his medications.  Denies any hospitalizations or visits to the emergency department.  ROS: 10 point review of systems has been reviewed and is considered negative with exception of what is listed in the HPI  Studies Reviewed:    EKG:  sinus rhythm rate of 61, no acute change from prior studies  TTE 01/24/23  1. Left ventricular ejection fraction, by estimation, is 55 to 60%. The  left ventricle has normal function. The left ventricle has no regional  wall motion abnormalities. Left ventricular diastolic parameters are  consistent with Grade I diastolic  dysfunction (impaired relaxation).   2. Right ventricular systolic function is normal. The right ventricular  size is normal.   3. The mitral valve is normal in structure. Mild mitral valve  regurgitation.   4. The aortic valve is tricuspid. Aortic valve regurgitation is not  visualized. Aortic valve sclerosis is present, with no evidence of aortic  valve stenosis.   5. The inferior vena cava is normal in size with greater than 50%  respiratory variability, suggesting right atrial pressure of 3 mmHg.   LHC 12/31/2022   Origin to Prox Graft lesion is 100% stenosed.   Origin to Prox Graft lesion is 100% stenosed.   Origin to Prox Graft lesion is 100% stenosed.   Dist RCA lesion is 100% stenosed.   Prox RCA lesion is 60% stenosed.   Ost LAD to Mid LAD lesion is 100% stenosed.   2nd Mrg lesion is 100% stenosed.   SVG.   SVG.   SVG graft was  visualized by angiography.   LIMA graft was visualized by angiography and is normal in caliber.   The graft exhibits no disease.   There is mild left ventricular systolic dysfunction.   LV end diastolic pressure is mildly elevated.   The left ventricular ejection fraction is 45-50% by visual estimate.   1.  Severe native three-vessel coronary artery disease with 3 out of 4 occluded grafts.  All SVGs are occluded and the only patent graft is the LIMA to  LAD.  The native right coronary artery has moderate proximal disease and is occluded distally.  Well-developed left to right collaterals are noted.  Most of the OM branches are also occluded with right to left collaterals from an RV marginal branch. 2.  Mildly reduced LV systolic function and mildly elevated left ventricular end-diastolic pressure.   Recommendations: No good target for PCI on the native vessels.  In addition, no good targets for redo CABG. Fortunately, collaterals are present.  I recommend medical therapy.    Risk Assessment/Calculations:              Physical Exam:   VS:  BP (!) 114/58 (BP Location: Left Arm, Patient Position: Sitting, Cuff Size: Normal)   Pulse 61   Ht 5\' 10"  (1.778 m)   Wt 208 lb 9.6 oz (94.6 kg)   SpO2 96%   BMI 29.93 kg/m    Wt Readings from Last 3 Encounters:  02/15/23 208 lb 9.6 oz (94.6 kg)  02/10/23 207 lb (93.9 kg)  02/01/23 204 lb (92.5 kg)     GEN: Well nourished, well developed in no acute distress NECK: No JVD; No carotid bruits CARDIAC: RRR, no murmurs, rubs, gallops RESPIRATORY:  Clear to auscultation without rales, wheezing or rhonchi  ABDOMEN: Soft, non-tender, non-distended EXTREMITIES:  No edema; No deformity   ASSESSMENT AND PLAN:   Coronary artery disease involving native coronary arteries with stable angina.  He just recently underwent left heart catheterization in 12/31/2022 at that time he was started to have severe native three-vessel coronary artery disease with 3 out of 4 occluded grafts.  All saphenous vein grafts were occluded and the only patent graft was a LIMA to the LAD.  The native right coronary artery has moderate proximal disease and is occluded distally.  He did have well-developed left-to-right collaterals that were noted.  Most of the OM branches were also occluded with right to left carotid levels from the RV marginal branch.  There were no good targets for PCI of the native vessels and no good targets for  redo CABG fortunately collaterals were present and it was recommended he undergo medical therapy.  He has been continued on aspirin 81 mg daily, atorvastatin 40 mg daily, carvedilol 6.25 mg twice daily.  Had repeat blood work that was done by his PCP.  Kidney function and electrolytes remained stable postprocedure.  Essential hypertension with blood pressure today 114/58.  If blood pressure remains stable to slightly lower than previously unfortunately is unable to monitor his blood pressures at home.  Patient denies having any anginal or anginal equivalent symptoms.  Will continue him on his current medication regimen of carvedilol and telmisartan.  His echocardiogram also revealed an LVEF of 55 to 60% because there was concern for underlying cardiomyopathy.  Mixed hyperlipidemia most recent lipid profile showed an LDL 13.  He is continued on atorvastatin 40 mg daily.  Aortic atherosclerosis with claudication and previous ABI in 2021 was normal.  He has palpable pulses on  exam.  Continue with medical management.  Type II diabetes where he is continued on metformin and [place] and Trulicity.  This continues to be managed by his PCP.  Disposition patient return to clinic to see MD/APP in 3 months or sooner if needed.        Signed, Tanay Misuraca, NP

## 2023-02-15 NOTE — Patient Instructions (Signed)
Medication Instructions:  No changes at this time.   *If you need a refill on your cardiac medications before your next appointment, please call your pharmacy*   Lab Work: None  If you have labs (blood work) drawn today and your tests are completely normal, you will receive your results only by: MyChart Message (if you have MyChart) OR A paper copy in the mail If you have any lab test that is abnormal or we need to change your treatment, we will call you to review the results.   Testing/Procedures: None   Follow-Up: At [facility], you and your health needs are our priority.  As part of our continuing mission to provide you with exceptional heart care, we have created designated Provider Care Teams.  These Care Teams include your primary Cardiologist (physician) and Advanced Practice Providers (APPs -  Physician Assistants and Nurse Practitioners) who all work together to provide you with the care you need, when you need it.   Your next appointment:   3 month(s)  Provider:   Muhammad Arida, MD or Sheri Hammock, NP    

## 2023-02-17 ENCOUNTER — Encounter: Payer: Self-pay | Admitting: Nurse Practitioner

## 2023-02-28 ENCOUNTER — Other Ambulatory Visit: Payer: Self-pay | Admitting: Nurse Practitioner

## 2023-02-28 DIAGNOSIS — E785 Hyperlipidemia, unspecified: Secondary | ICD-10-CM

## 2023-03-01 NOTE — Telephone Encounter (Signed)
Requested Prescriptions  Pending Prescriptions Disp Refills   atorvastatin (LIPITOR) 40 MG tablet [Pharmacy Med Name: Atorvastatin Calcium 40 MG Oral Tablet] 100 tablet 0    Sig: TAKE 1 TABLET BY MOUTH DAILY     Cardiovascular:  Antilipid - Statins Failed - 02/28/2023 10:57 PM      Failed - Lipid Panel in normal range within the last 12 months    Cholesterol, Total  Date Value Ref Range Status  05/27/2022 69 (L) 100 - 199 mg/dL Final   LDL Cholesterol (Calc)  Date Value Ref Range Status  11/29/2017 68 mg/dL (calc) Final    Comment:    Reference range: <100 . Desirable range <100 mg/dL for primary prevention;   <70 mg/dL for patients with CHD or diabetic patients  with > or = 2 CHD risk factors. Marland Kitchen LDL-C is now calculated using the Martin-Hopkins  calculation, which is a validated novel method providing  better accuracy than the Friedewald equation in the  estimation of LDL-C.  Horald Pollen et al. Lenox Ahr. 1610;960(45): 2061-2068  (http://education.[facility].com/faq/FAQ164)    LDL Chol Calc (NIH)  Date Value Ref Range Status  05/27/2022 13 0 - 99 mg/dL Final   HDL  Date Value Ref Range Status  05/27/2022 30 (L) >39 mg/dL Final   Triglycerides  Date Value Ref Range Status  05/27/2022 156 (H) 0 - 149 mg/dL Final         Passed - Patient is not pregnant      Passed - Valid encounter within last 12 months    Recent Outpatient Visits           1 month ago Hypertension associated with diabetes [facility])   [place] [facility] Larae Grooms, NP   3 months ago Aortic atherosclerosis [facility])   [place] [facility] Larae Grooms, NP   4 months ago Aortic atherosclerosis [facility])   [place] [facility] Larae Grooms, NP   8 months ago Hypertension associated with diabetes [facility])   [place] [facility] Larae Grooms, NP   9 months ago Pre-op evaluation   [place] [facility] Larae Grooms, NP       Future Appointments             In 2 months Charlsie Quest, NP [facility] [place]   In 5 months Larae Grooms, NP [place] [facility]

## 2023-03-04 ENCOUNTER — Other Ambulatory Visit: Payer: Self-pay | Admitting: Nurse Practitioner

## 2023-03-04 DIAGNOSIS — E1159 Type 2 diabetes mellitus with other circulatory complications: Secondary | ICD-10-CM

## 2023-03-04 NOTE — Telephone Encounter (Signed)
Unable to refill per protocol, Rx expired. Discontinued 12/28/22.  Requested Prescriptions  Pending Prescriptions Disp Refills   cloNIDine (CATAPRES) 0.1 MG tablet [Pharmacy Med Name: cloNIDine HCl 0.1 MG Oral Tablet] 200 tablet 2    Sig: TAKE 1 TABLET BY MOUTH TWICE  DAILY     Cardiovascular:  Alpha-2 Agonists Passed - 03/04/2023  6:23 AM      Passed - Last BP in normal range    BP Readings from Last 1 Encounters:  02/15/23 (!) 114/58         Passed - Last Heart Rate in normal range    Pulse Readings from Last 1 Encounters:  02/15/23 61         Passed - Valid encounter within last 6 months    Recent Outpatient Visits           1 month ago Hypertension associated with diabetes [facility])   [place] [facility] Larae Grooms, NP   3 months ago Aortic atherosclerosis [facility])   [place] [facility] Larae Grooms, NP   4 months ago Aortic atherosclerosis [facility])   [place] [facility] Larae Grooms, NP   8 months ago Hypertension associated with diabetes [facility])   [place] [facility] Larae Grooms, NP   9 months ago Pre-op evaluation   [place] [facility] Larae Grooms, NP       Future Appointments             In 2 months Charlsie Quest, NP [facility] [place]   In 5 months Larae Grooms, NP [facility]

## 2023-03-07 DIAGNOSIS — Z794 Long term (current) use of insulin: Secondary | ICD-10-CM | POA: Diagnosis not present

## 2023-03-07 DIAGNOSIS — H2511 Age-related nuclear cataract, right eye: Secondary | ICD-10-CM | POA: Diagnosis not present

## 2023-03-07 DIAGNOSIS — H2513 Age-related nuclear cataract, bilateral: Secondary | ICD-10-CM | POA: Diagnosis not present

## 2023-03-07 DIAGNOSIS — H25013 Cortical age-related cataract, bilateral: Secondary | ICD-10-CM | POA: Diagnosis not present

## 2023-03-07 DIAGNOSIS — E119 Type 2 diabetes mellitus without complications: Secondary | ICD-10-CM | POA: Diagnosis not present

## 2023-03-21 ENCOUNTER — Other Ambulatory Visit: Payer: Self-pay | Admitting: Nurse Practitioner

## 2023-03-21 NOTE — Telephone Encounter (Signed)
Medication Refill - Medication: dapagliflozin propanediol (FARXIGA) 10 MG TABS tablet  Has the patient contacted their pharmacy? No. (Agent: If no, request that the patient contact the pharmacy for the refill. If patient does not wish to contact the pharmacy document the reason why and proceed with request.) (Agent: If yes, when and what did the pharmacy advise?)  Preferred Pharmacy (with phone number or street name):  [facility]) [place], [place] - [address] [place]  Phone: [phone] Fax: [phone]  Has the patient been seen for an appointment in the last year OR does the patient have an upcoming appointment? Yes.    Agent: Please be advised that RX refills may take up to 3 business days. We ask that you follow-up with your pharmacy.

## 2023-03-22 NOTE — Telephone Encounter (Signed)
Unable to refill per protocol, Rx request is too soon. Last refill 10/28/22 for 90 and 1 refill.  Requested Prescriptions  Pending Prescriptions Disp Refills   dapagliflozin propanediol (FARXIGA) 10 MG TABS tablet 90 tablet 1    Sig: Take 1 tablet (10 mg total) by mouth daily before breakfast.     Endocrinology:  Diabetes - SGLT2 Inhibitors Passed - 03/21/2023  4:22 PM      Passed - Cr in normal range and within 360 days    Creat  Date Value Ref Range Status  11/29/2017 1.03 0.70 - 1.18 mg/dL Final    Comment:    For patients >66 years of age, the reference limit for Creatinine is approximately 13% higher for people identified as African-American. .    Creatinine, Ser  Date Value Ref Range Status  01/31/2023 1.07 0.76 - 1.27 mg/dL Final   Creatinine, Urine  Date Value Ref Range Status  04/21/2017 206 20 - 370 mg/dL Final         Passed - HBA1C is between 0 and 7.9 and within 180 days    HB A1C (BAYER DCA - WAIVED)  Date Value Ref Range Status  12/05/2020 6.9 <7.0 % Final    Comment:                                          Diabetic Adult            <7.0                                       Healthy Adult        4.3 - 5.7                                                           (DCCT/NGSP) American Diabetes Association's Summary of Glycemic Recommendations for Adults with Diabetes: Hemoglobin A1c <7.0%. More stringent glycemic goals (A1c <6.0%) may further reduce complications at the cost of increased risk of hypoglycemia.    Hgb A1c MFr Bld  Date Value Ref Range Status  01/31/2023 5.8 (H) 4.8 - 5.6 % Final    Comment:             Prediabetes: 5.7 - 6.4          Diabetes: >6.4          Glycemic control for adults with diabetes: <7.0          Passed - eGFR in normal range and within 360 days    GFR, Est African American  Date Value Ref Range Status  04/21/2017 86 >=60 mL/min Final   GFR calc Af Amer  Date Value Ref Range Status  12/05/2020 91 >59 mL/min/1.73  Final    Comment:    **In accordance with recommendations from the NKF-ASN Task force,**   [facility] is in the process of updating its eGFR calculation to the   2021 CKD-EPI creatinine equation that estimates kidney function   without a race variable.    GFR, Est Non African American  Date Value Ref Range Status  04/21/2017 74 >=60 mL/min Final  GFR, Estimated  Date Value Ref Range Status  12/29/2022 >60 >60 mL/min Final    Comment:    (NOTE) Calculated using the CKD-EPI Creatinine Equation (2021)    eGFR  Date Value Ref Range Status  01/31/2023 72 >59 mL/min/1.73 Final         Passed - Valid encounter within last 6 months    Recent Outpatient Visits           1 month ago Hypertension associated with diabetes [facility])   [place] [facility] Larae Grooms, NP   3 months ago Aortic atherosclerosis [facility])   [place] [facility] Larae Grooms, NP   4 months ago Aortic atherosclerosis [facility])   [place] [facility] Larae Grooms, NP   9 months ago Hypertension associated with diabetes [facility])   [place] [facility] Larae Grooms, NP   9 months ago Pre-op evaluation   [place] [facility] Larae Grooms, NP       Future Appointments             In 1 month Hammock, Lavonna Rua, NP  [facility] [place]   In 4 months Larae Grooms, NP  [facility]

## 2023-04-14 ENCOUNTER — Other Ambulatory Visit: Payer: Self-pay | Admitting: Nurse Practitioner

## 2023-04-14 NOTE — Telephone Encounter (Signed)
Medication Refill - Medication: dapagliflozin propanediol (FARXIGA) 10 MG TABS tablet   Has the patient contacted their pharmacy? No.  Preferred Pharmacy (with phone number or street name):  [facility]) [place], [place] - [address] Phone: [phone]  Fax: [phone]     Has the patient been seen for an appointment in the last year OR does the patient have an upcoming appointment? No.  Agent: Please be advised that RX refills may take up to 3 business days. We ask that you follow-up with your pharmacy.  Patient will be out of medication in 2 days

## 2023-04-15 MED ORDER — DAPAGLIFLOZIN PROPANEDIOL 10 MG PO TABS: 10.0000 mg | ORAL_TABLET | Freq: Every day | ORAL | 1 refills | Status: DC

## 2023-04-28 ENCOUNTER — Ambulatory Visit: Payer: Medicare Other | Admitting: Physician Assistant

## 2023-05-09 ENCOUNTER — Other Ambulatory Visit: Payer: Self-pay | Admitting: Nurse Practitioner

## 2023-05-09 DIAGNOSIS — E1169 Type 2 diabetes mellitus with other specified complication: Secondary | ICD-10-CM

## 2023-05-11 NOTE — Telephone Encounter (Signed)
Requested Prescriptions  Pending Prescriptions Disp Refills   atorvastatin (LIPITOR) 40 MG tablet [Pharmacy Med Name: Atorvastatin Calcium 40 MG Oral Tablet] 100 tablet 1    Sig: TAKE 1 TABLET BY MOUTH DAILY     Cardiovascular:  Antilipid - Statins Failed - 05/09/2023 10:33 PM      Failed - Lipid Panel in normal range within the last 12 months    Cholesterol, Total  Date Value Ref Range Status  05/27/2022 69 (L) 100 - 199 mg/dL Final   LDL Cholesterol (Calc)  Date Value Ref Range Status  11/29/2017 68 mg/dL (calc) Final    Comment:    Reference range: <100 . Desirable range <100 mg/dL for primary prevention;   <70 mg/dL for patients with CHD or diabetic patients  with > or = 2 CHD risk factors. Marland Kitchen LDL-C is now calculated using the Martin-Hopkins  calculation, which is a validated novel method providing  better accuracy than the Friedewald equation in the  estimation of LDL-C.  Horald Pollen et al. Lenox Ahr. 5009;381(82): 2061-2068  (http://education.[facility].com/faq/FAQ164)    LDL Chol Calc (NIH)  Date Value Ref Range Status  05/27/2022 13 0 - 99 mg/dL Final   HDL  Date Value Ref Range Status  05/27/2022 30 (L) >39 mg/dL Final   Triglycerides  Date Value Ref Range Status  05/27/2022 156 (H) 0 - 149 mg/dL Final         Passed - Patient is not pregnant      Passed - Valid encounter within last 12 months    Recent Outpatient Visits           3 months ago Hypertension associated with diabetes [facility])   [place] [facility] Larae Grooms, NP   5 months ago Aortic atherosclerosis [facility])   [place] Larae Grooms, NP   6 months ago Aortic atherosclerosis [facility])   [place] [facility] Larae Grooms, NP   10 months ago Hypertension associated with diabetes [facility])   [place] [facility] Larae Grooms, NP   11 months ago Pre-op evaluation   [place] [facility]  Larae Grooms, NP       Future Appointments             In 6 days Charlsie Quest, NP [facility] [place]   In 3 months Larae Grooms, NP  [facility]

## 2023-05-17 ENCOUNTER — Ambulatory Visit: Payer: Medicare Other | Attending: Cardiology | Admitting: Cardiology

## 2023-05-17 ENCOUNTER — Encounter: Payer: Self-pay | Admitting: Cardiology

## 2023-05-17 VITALS — BP 122/60 | HR 55 | Ht 70.0 in | Wt 213.0 lb

## 2023-05-17 DIAGNOSIS — I25110 Atherosclerotic heart disease of native coronary artery with unstable angina pectoris: Secondary | ICD-10-CM

## 2023-05-17 DIAGNOSIS — I10 Essential (primary) hypertension: Secondary | ICD-10-CM | POA: Diagnosis not present

## 2023-05-17 DIAGNOSIS — I700 Atherosclerosis of aorta: Secondary | ICD-10-CM | POA: Diagnosis not present

## 2023-05-17 DIAGNOSIS — Z7984 Long term (current) use of oral hypoglycemic drugs: Secondary | ICD-10-CM

## 2023-05-17 DIAGNOSIS — E782 Mixed hyperlipidemia: Secondary | ICD-10-CM | POA: Diagnosis not present

## 2023-05-17 DIAGNOSIS — E1142 Type 2 diabetes mellitus with diabetic polyneuropathy: Secondary | ICD-10-CM | POA: Diagnosis not present

## 2023-05-17 NOTE — Progress Notes (Signed)
Cardiology Office Note:  .   Date:  05/17/2023  ID:  Juan Le., DOB May 10, 1946, MRN [id number] PCP: Larae Grooms, NP  [facility] Providers Cardiologist:  Lorine Bears, MD    History of Present Illness: .   Juan Le. is a 77 y.o. male with past medical history of coronary disease status post CABG in 1997 at [facility], essential hypertension, hyperlipidemia, type 2 diabetes, gastroesophageal reflux disease, and obesity who is here today for follow-up of his coronary artery disease.  Coronary artery disease status post CABG in 1997 at [facility].  Cardiac catheterization in 2008 that showed significant underlying three-vessel coronary artery disease with patent LIMA-LAD, patent SVG-RPDA, patent SVG-OM patent SVG-D1 which had a 90% ostial stenosis.  He was treated with PCI/DES.  No cardiac events since that time.  Last heart catheterization was completed on 12/31/2022 which showed severe native three-vessel coronary disease with 3 out of 4 occluded grafts.  All SVGs were occluded with patent graft from LIMA-LAD.  The native right coronary artery had moderate proximal disease and occluded distal LAD.  Well-developed left to right collaterals were noted.  Rest of the OM branches were also occluded with right to left collaterals from the RV marginal branch.  He was recommended for medical treatment as there were no good targets for PCI of the native vessels and no good targets for redo CABG.  He was last seen in clinic 02/15/2023 stating that he had been doing well since his heart catheterization.  He denies any chest pain or shortness of breath.  He had no complications postprocedure and had follow-up blood work done by his PCP that remained stable.  Medications were continued without any changes at that time.  He returns to clinic today accompanied by his wife.  He states that overall he has been doing fairly well.  He denies any chest pain, shortness of breath, palpitations, or  peripheral edema.  States he has been compliant with his medications.  Denies any hospitalizations or visits to the emergency department.  ROS: 10 point review of systems has been reviewed and considered negative with exception of what is been listed in the HPI  Studies Reviewed: Marland Kitchen   EKG Interpretation Date/Time:  Tuesday May 17 2023 08:44:48 EDT Ventricular Rate:  55 PR Interval:  240 QRS Duration:  80 QT Interval:  420 QTC Calculation: 401 R Axis:   32  Text Interpretation: Sinus bradycardia with 1st degree A-V block Cannot rule out Anterior infarct (cited on or before 10-Dec-2006) When compared with ECG of 10-Dec-2006 09:03, PR interval has increased Nonspecific T wave abnormality now evident in Lateral leads Confirmed by Charlsie Quest ([id number]) on 05/17/2023 8:48:09 AM    TTE 01/24/23  1. Left ventricular ejection fraction, by estimation, is 55 to 60%. The  left ventricle has normal function. The left ventricle has no regional  wall motion abnormalities. Left ventricular diastolic parameters are  consistent with Grade I diastolic  dysfunction (impaired relaxation).   2. Right ventricular systolic function is normal. The right ventricular  size is normal.   3. The mitral valve is normal in structure. Mild mitral valve  regurgitation.   4. The aortic valve is tricuspid. Aortic valve regurgitation is not  visualized. Aortic valve sclerosis is present, with no evidence of aortic  valve stenosis.   5. The inferior vena cava is normal in size with greater than 50%  respiratory variability, suggesting right atrial pressure of 3 mmHg.  LHC 12/31/2022   Origin to Prox Graft lesion is 100% stenosed.   Origin to Prox Graft lesion is 100% stenosed.   Origin to Prox Graft lesion is 100% stenosed.   Dist RCA lesion is 100% stenosed.   Prox RCA lesion is 60% stenosed.   Ost LAD to Mid LAD lesion is 100% stenosed.   2nd Mrg lesion is 100% stenosed.   SVG.   SVG.   SVG graft was visualized  by angiography.   LIMA graft was visualized by angiography and is normal in caliber.   The graft exhibits no disease.   There is mild left ventricular systolic dysfunction.   LV end diastolic pressure is mildly elevated.   The left ventricular ejection fraction is 45-50% by visual estimate.   1.  Severe native three-vessel coronary artery disease with 3 out of 4 occluded grafts.  All SVGs are occluded and the only patent graft is the LIMA to LAD.  The native right coronary artery has moderate proximal disease and is occluded distally.  Well-developed left to right collaterals are noted.  Most of the OM branches are also occluded with right to left collaterals from an RV marginal branch. 2.  Mildly reduced LV systolic function and mildly elevated left ventricular end-diastolic pressure.   Recommendations: No good target for PCI on the native vessels.  In addition, no good targets for redo CABG. Fortunately, collaterals are present.  I recommend medical therapy. Risk Assessment/Calculations:             Physical Exam:   VS:  BP 122/60 (BP Location: Left Arm, Patient Position: Sitting, Cuff Size: Normal)   Pulse (!) 55   Ht 5\' 10"  (1.778 m)   Wt 213 lb (96.6 kg)   SpO2 98%   BMI 30.56 kg/m    Wt Readings from Last 3 Encounters:  05/17/23 213 lb (96.6 kg)  02/15/23 208 lb 9.6 oz (94.6 kg)  02/10/23 207 lb (93.9 kg)    GEN: Well nourished, well developed in no acute distress NECK: No JVD; No carotid bruits CARDIAC: RRR, no murmurs, rubs, gallops RESPIRATORY:  Clear to auscultation without rales, wheezing or rhonchi  ABDOMEN: Soft, non-tender, non-distended EXTREMITIES:  No edema; No deformity   ASSESSMENT AND PLAN: .   Coronary artery disease involving native coronary arteries with stable angina.  Patient is currently not had any complaints of chest discomfort since his last heart catheterization.  EKG today reveals sinus with no ischemic changes.  He has been continued on aspirin 80  g daily, atorvastatin 40 daily, carvedilol 6.25 mg twice daily.  He requires no refills to his medications at this time.  Essential hypertension with blood pressure today 122/68.  Blood pressures have remained stable.  He is continued on carvedilol and Micardis.  Encouraged to continue to monitor pressures at home 1 to 2 mm post medication administration.  Mixed hyperlipidemia (recent lipid profile showed LDL 13.  He is continued on atorvastatin 40 mg daily.  Aortic atherosclerosis with claudication and previous ABI 2021 that was normal.  He continues to have palpable pulses on exam.  Denies any claudication symptoms today.  Will continue with medical management.  Type 2 diabetes where he is continued on metformin, Farxiga, Trulicity.  This continues to be managed by his PCP.       Dispo: Patient to return to clinic to see MD/APP in 3 to 4 months or sooner if needed  Signed, Kadon Andrus, NP

## 2023-05-17 NOTE — Patient Instructions (Signed)
Medication Instructions:  Your physician recommends that you continue on your current medications as directed. Please refer to the Current Medication list given to you today.  *If you need a refill on your cardiac medications before your next appointment, please call your pharmacy*   Lab Work: None If you have labs (blood work) drawn today and your tests are completely normal, you will receive your results only by: MyChart Message (if you have MyChart) OR A paper copy in the mail If you have any lab test that is abnormal or we need to change your treatment, we will call you to review the results.   Testing/Procedures: none   Follow-Up: At [facility], you and your health needs are our priority.  As part of our continuing mission to provide you with exceptional heart care, we have created designated Provider Care Teams.  These Care Teams include your primary Cardiologist (physician) and Advanced Practice Providers (APPs -  Physician Assistants and Nurse Practitioners) who all work together to provide you with the care you need, when you need it.  We recommend signing up for the patient portal called "MyChart".  Sign up information is provided on this After Visit Summary.  MyChart is used to connect with patients for Virtual Visits (Telemedicine).  Patients are able to view lab/test results, encounter notes, upcoming appointments, etc.  Non-urgent messages can be sent to your provider as well.   To learn more about what you can do with MyChart, go to [email or web address].    Your next appointment:   3-4 Months  Provider:   You may see Lorine Bears, MD or one of the following Advanced Practice Providers on your designated Care Team:   Charlsie Quest, NP

## 2023-05-26 DIAGNOSIS — H2511 Age-related nuclear cataract, right eye: Secondary | ICD-10-CM | POA: Diagnosis not present

## 2023-05-27 DIAGNOSIS — H25012 Cortical age-related cataract, left eye: Secondary | ICD-10-CM | POA: Diagnosis not present

## 2023-05-27 DIAGNOSIS — E119 Type 2 diabetes mellitus without complications: Secondary | ICD-10-CM | POA: Diagnosis not present

## 2023-05-27 DIAGNOSIS — H2512 Age-related nuclear cataract, left eye: Secondary | ICD-10-CM | POA: Diagnosis not present

## 2023-06-02 DIAGNOSIS — H2511 Age-related nuclear cataract, right eye: Secondary | ICD-10-CM | POA: Diagnosis not present

## 2023-07-21 DIAGNOSIS — H2512 Age-related nuclear cataract, left eye: Secondary | ICD-10-CM | POA: Diagnosis not present

## 2023-07-29 DIAGNOSIS — H2512 Age-related nuclear cataract, left eye: Secondary | ICD-10-CM | POA: Diagnosis not present

## 2023-08-10 ENCOUNTER — Encounter: Payer: Self-pay | Admitting: Nurse Practitioner

## 2023-08-10 ENCOUNTER — Ambulatory Visit (INDEPENDENT_AMBULATORY_CARE_PROVIDER_SITE_OTHER): Payer: Medicare Other | Admitting: Nurse Practitioner

## 2023-08-10 VITALS — BP 96/61 | HR 59 | Temp 98.1°F | Ht 70.0 in | Wt 210.0 lb

## 2023-08-10 DIAGNOSIS — E1142 Type 2 diabetes mellitus with diabetic polyneuropathy: Secondary | ICD-10-CM | POA: Diagnosis not present

## 2023-08-10 DIAGNOSIS — Z7984 Long term (current) use of oral hypoglycemic drugs: Secondary | ICD-10-CM | POA: Diagnosis not present

## 2023-08-10 DIAGNOSIS — Z7985 Long-term (current) use of injectable non-insulin antidiabetic drugs: Secondary | ICD-10-CM

## 2023-08-10 DIAGNOSIS — I152 Hypertension secondary to endocrine disorders: Secondary | ICD-10-CM | POA: Diagnosis not present

## 2023-08-10 DIAGNOSIS — D519 Vitamin B12 deficiency anemia, unspecified: Secondary | ICD-10-CM

## 2023-08-10 DIAGNOSIS — E785 Hyperlipidemia, unspecified: Secondary | ICD-10-CM

## 2023-08-10 DIAGNOSIS — Z0000 Encounter for general adult medical examination without abnormal findings: Secondary | ICD-10-CM | POA: Diagnosis not present

## 2023-08-10 DIAGNOSIS — I700 Atherosclerosis of aorta: Secondary | ICD-10-CM

## 2023-08-10 DIAGNOSIS — E1159 Type 2 diabetes mellitus with other circulatory complications: Secondary | ICD-10-CM

## 2023-08-10 DIAGNOSIS — E1169 Type 2 diabetes mellitus with other specified complication: Secondary | ICD-10-CM | POA: Diagnosis not present

## 2023-08-10 DIAGNOSIS — J4489 Other specified chronic obstructive pulmonary disease: Secondary | ICD-10-CM | POA: Diagnosis not present

## 2023-08-10 DIAGNOSIS — G4733 Obstructive sleep apnea (adult) (pediatric): Secondary | ICD-10-CM

## 2023-08-10 DIAGNOSIS — Z7189 Other specified counseling: Secondary | ICD-10-CM | POA: Insufficient documentation

## 2023-08-10 DIAGNOSIS — J439 Emphysema, unspecified: Secondary | ICD-10-CM

## 2023-08-10 DIAGNOSIS — E66811 Obesity, class 1: Secondary | ICD-10-CM

## 2023-08-10 NOTE — Assessment & Plan Note (Signed)
Recommended eating smaller high protein, low fat meals more frequently and exercising 30 mins a day 5 times a week with a goal of 10-15lb weight loss in the next 3 months.  

## 2023-08-10 NOTE — Assessment & Plan Note (Signed)
Chronic.  Controlled.  Continue with current medication regimen.  Denies concerns about breathing today. Return to clinic in 3 months for reevaluation.  Call sooner if concerns arise.   

## 2023-08-10 NOTE — Assessment & Plan Note (Signed)
A voluntary discussion about advance care planning including the explanation and discussion of advance directives was extensively discussed  with the patient for 5 minutes with patient and his wife, Lupita Leash, present.  Explanation about the health care proxy and Living will was reviewed and packet with forms with explanation of how to fill them out was given.  During this discussion, the patient was able to identify a health care proxy as his wife and plans to fill out the paperwork required.  Patient was offered a separate Advance Care Planning visit for further assistance with forms.

## 2023-08-10 NOTE — Assessment & Plan Note (Signed)
Chronic.  Controlled.  Continue with current medication regimen of Gabapentin.  Continue with [place] and Trulicity.  Will work with pharmacy to get PAP for [employer].  Has been out of medication since June or July.  Labs ordered today.  A1c has been well controlled at 5.8%.  Eye exam up to date.  Return to clinic in 3 months for reevaluation.  Call sooner if concerns arise.

## 2023-08-10 NOTE — Progress Notes (Signed)
Subjective:   Juan Coots. is a 77 y.o. male who presents for Medicare Annual/Subsequent preventive examination.  Visit Complete: In person  Patient Medicare AWV questionnaire was completed by the patient on 10/10/23; I have confirmed that all information answered by patient is correct and no changes since this date.   HYPERTENSION / HYPERLIPIDEMIA Doing well.  Denies concerns at visit today.  Satisfied with current treatment? yes Duration of hypertension: years BP monitoring frequency:no BP range:  BP medication side effects: no Past BP meds: telmisartan and clonidine Duration of hyperlipidemia: years Cholesterol medication side effects: no Cholesterol supplements: none Past cholesterol medications: atorvastain (lipitor) Medication compliance: excellent compliance Aspirin: yes Recent stressors: no Recurrent headaches: no Visual changes: no Palpitations: no Dyspnea: with excretion Chest pain: no Lower extremity edema: no Dizzy/lightheaded: no  DIABETES Now taking [place] and Trulicity.  Denies concerns regarding medication at visit today.   Hypoglycemic episodes:no Polydipsia/polyuria: no Visual disturbance: no Chest pain: no Paresthesias: yes Glucose Monitoring: no  Accucheck frequency: hasn't been checking it  Fasting glucose:  Post prandial:  Evening:  Before meals: Taking Insulin?: no  Long acting insulin:  Short acting insulin: Blood Pressure Monitoring: sometimes Retinal Examination: Up to Date Foot Exam: Up to Date Diabetic Education: Not Completed Pneumovax: Up to Date Influenza: Up to Date Aspirin: yes  COPD COPD status: controlled Satisfied with current treatment?: yes Oxygen use: no Dyspnea frequency: nonel Cough frequency:  Rescue inhaler frequency:  none Limitation of activity: yes Productive cough:  Last Spirometry: 03/04/2021 Pneumovax: Up to Date Influenza: Up to Date  SLEEP APNEA Doesn't even have a CPAP machine.  Not  interested in getting another machine or doing another sleep study.  Does not want to get CPAP. Sleep apnea status: uncontrolled Duration: chronic Satisfied with current treatment?:  no CPAP use:  no- didn't like it Sleep quality with CPAP use: good Treament compliance:poor compliance Last sleep study:  Treatments attempted: CPAP Wakes feeling refreshed:  yes Daytime hypersomnolence:  no Fatigue:  no Insomnia:  no Good sleep hygiene:  no Difficulty falling asleep:  no Difficulty staying asleep:  no Snoring bothers bed partner:  unknown Observed apnea by bed partner: no Obesity:  yes Hypertension: yes  Pulmonary hypertension:  no Coronary artery disease:  yes  NEUROPATHY Patient states his neuropathy is about the same.  Stats the pain is a burning in his feet.  Feels like the gabapentin helps with his pain.  He is currently taking 4 tabs of gabapentin instead of 6.  He still feels numbness but the pain does improve with gabapentin  Physical Exam Vitals and nursing note reviewed.  Constitutional:      General: He is not in acute distress.    Appearance: Normal appearance. He is not ill-appearing, toxic-appearing or diaphoretic.  HENT:     Head: Normocephalic.     Right Ear: External ear normal.     Left Ear: External ear normal.     Nose: Nose normal. No congestion or rhinorrhea.     Mouth/Throat:     Mouth: Mucous membranes are moist.  Eyes:     General:        Right eye: No discharge.        Left eye: No discharge.     Extraocular Movements: Extraocular movements intact.     Conjunctiva/sclera: Conjunctivae normal.     Pupils: Pupils are equal, round, and reactive to light.  Cardiovascular:     Rate and Rhythm: Normal rate  and regular rhythm.     Heart sounds: No murmur heard. Pulmonary:     Effort: Pulmonary effort is normal. No respiratory distress.     Breath sounds: Normal breath sounds. No wheezing, rhonchi or rales.  Abdominal:     General: Abdomen is flat.  Bowel sounds are normal.  Musculoskeletal:     Cervical back: Normal range of motion and neck supple.  Skin:    General: Skin is warm and dry.     Capillary Refill: Capillary refill takes less than 2 seconds.  Neurological:     General: No focal deficit present.     Mental Status: He is alert and oriented to person, place, and time.  Psychiatric:        Mood and Affect: Mood normal.        Behavior: Behavior normal.        Thought Content: Thought content normal.        Judgment: Judgment normal.         Objective:    Today's Vitals   08/10/23 1323  BP: 96/61  Pulse: (!) 59  Temp: 98.1 F (36.7 C)  TempSrc: Oral  SpO2: 97%  Weight: 210 lb (95.3 kg)  Height: 5\' 10"  (1.778 m)   Body mass index is 30.13 kg/m.     12/31/2022   11:12 AM 07/21/2022    9:35 AM 06/29/2022    7:54 AM 07/15/2021    5:49 PM 12/17/2019    8:52 AM 12/13/2018    9:37 AM 01/24/2018    1:40 PM  Advanced Directives  Does Patient Have a Medical Advance Directive? Yes Yes Yes Yes Yes Yes Yes  Type of Advance Directive Living will Living will Healthcare Power of [place];Living will Healthcare Power of [place];Living will Healthcare Power of [place];Living will Healthcare Power of [place];Living will Living will;Healthcare Power of Attorney  Does patient want to make changes to medical advance directive? No - Patient declined   No - Patient declined     Copy of Healthcare Power of Attorney in Chart?  No - copy requested No - copy requested No - copy requested No - copy requested No - copy requested No - copy requested    Current Medications (verified) Outpatient Encounter Medications as of 08/10/2023  Medication Sig   aspirin 81 MG tablet Take 1 tablet by mouth daily.   atorvastatin (LIPITOR) 40 MG tablet TAKE 1 TABLET BY MOUTH DAILY   Blood Glucose Monitoring Suppl (ONETOUCH VERIO FLEX SYSTEM) w/Device KIT USE UP TO 4 TIMES DAILY AS  DIRECTED   carvedilol (COREG) 6.25 MG tablet Take 1 tablet (6.25 mg  total) by mouth 2 (two) times daily.   dapagliflozin propanediol (FARXIGA) 10 MG TABS tablet Take 1 tablet (10 mg total) by mouth daily before breakfast.   Dulaglutide (TRULICITY) 0.75 MG/0.5ML SOPN Inject 0.75 mg into the skin once a week.   gabapentin (NEURONTIN) 400 MG capsule TAKE 1 CAPSULE BY MOUTH 3 TIMES  DAILY   Lancets (ONETOUCH DELICA PLUS LANCET33G) MISC USE AS DIRECTED TO CHECK  BLOOD GLUCOSE ONCE DAILY   metFORMIN (GLUCOPHAGE) 1000 MG tablet Take 1 tablet (1,000 mg total) by mouth 2 (two) times daily with a meal.   omeprazole (PRILOSEC) 40 MG capsule Take 1 capsule (40 mg total) by mouth daily.   ONETOUCH VERIO test strip USE AS INSTRUCTED  TO TEST  BLOOD SUGAR TWO TIMES DAILY   QUEtiapine (SEROQUEL) 200 MG tablet Take 1 tablet (200 mg total) by mouth  at bedtime.   telmisartan (MICARDIS) 40 MG tablet Take 1 tablet (40 mg total) by mouth daily.   No facility-administered encounter medications on file as of 08/10/2023.    Allergies (verified) Ace inhibitors and Diphenhydramine   History: Past Medical History:  Diagnosis Date   Anxiety    Atelectasis of left lung 03/25/2017   Chronic, continuous use of opioids    COPD (chronic obstructive pulmonary disease) (HCC)    Coronary artery disease    Degenerative disc disease, lumbar    Diabetes (HCC)    DJD (degenerative joint disease)    GERD (gastroesophageal reflux disease)    Heart attack (HCC)    History of diverticulitis    Hypertension    Hypertension    Hypertriglyceridemia    Hypokalemia    Insomnia    Mitral regurgitation    Sleep apnea    Vitamin D deficiency    Wears dentures    full upper   Past Surgical History:  Procedure Laterality Date   COLONOSCOPY WITH PROPOFOL N/A 01/28/2017   Procedure: COLONOSCOPY WITH PROPOFOL;  Surgeon: Midge Minium, MD;  Location: [facility];  Service: Endoscopy;  Laterality: N/A;  diabetic - oral meds   COLONOSCOPY WITH PROPOFOL N/A 06/29/2022   Procedure: COLONOSCOPY  WITH PROPOFOL;  Surgeon: Midge Minium, MD;  Location[facility];  Service: Endoscopy;  Laterality: N/A;   CORONARY ARTERY BYPASS GRAFT  1997   CORONARY STENT PLACEMENT  11/2006   FOOT SURGERY  2003   HERNIA REPAIR     LEFT HEART CATH AND CORONARY ANGIOGRAPHY Left 12/31/2022   Procedure: LEFT HEART CATH AND CORONARY ANGIOGRAPHY;  Surgeon: Iran Ouch, MD;  Location: [facility] INVAS[facility] CV LAB;  Service: Cardiovascular;  Laterality: Left;   POLYPECTOMY N/A 01/28/2017   Procedure: POLYPECTOMY;  Surgeon: Midge Minium, MD;  Location: [facility];  Service: Endoscopy;  Laterality: N/A;   UMBILICAL HERNIA REPAIR  04/2012   Family History  Problem Relation Age of Onset   Throat cancer Father    Colon cancer Father    Heart disease Father    Cancer Father    Depression Father    Hypertension Father    Parkinson's disease Father    Arthritis Brother    Stroke Mother    Kidney disease Mother    Heart disease Mother    Kidney failure Mother    Breast cancer Paternal Aunt    Depression Sister    Stroke Maternal Grandmother    Bone cancer Brother    Stroke Brother    Prostate cancer Neg Hx    Kidney cancer Neg Hx    Bladder Cancer Neg Hx    Social History   Socioeconomic History   Marital status: Married    Spouse name: Not on file   Number of children: 2   Years of education: 12   Highest education level: High school graduate  Occupational History   Occupation: Retired    Comment: formerly worked at [employer] Administrator, [employer])  Tobacco Use   Smoking status: Former    Current packs/day: 0.00    Types: Cigarettes    Quit date: 10/19/1955    Years since quitting: 67.8    Passive exposure: Past   Smokeless tobacco: Never   Tobacco comments:    former light smoker; quit in his teens  Vaping Use   Vaping status: Never Used  Substance and Sexual Activity   Alcohol use: No    Alcohol/week: 0.0 standard  drinks of alcohol   Drug use: No   Sexual activity: Yes     Partners: Female  Other Topics Concern   Not on file  Social History Narrative   Not on file   Social Determinants of Health   Financial Resource Strain: Low Risk  (07/21/2022)   Overall Financial Resource Strain (CARDIA)    Difficulty of Paying Living Expenses: Not very hard  Recent Concern: Financial Resource Strain - High Risk (05/31/2022)   Overall Financial Resource Strain (CARDIA)    Difficulty of Paying Living Expenses: Hard  Food Insecurity: No Food Insecurity (07/21/2022)   Hunger Vital Sign    Worried About Running Out of Food in the Last Year: Never true    Ran Out of Food in the Last Year: Never true  Transportation Needs: No Transportation Needs (07/21/2022)   PRAPARE - Administrator, Civil Service (Medical): No    Lack of Transportation (Non-Medical): No  Physical Activity: Inactive (07/21/2022)   Exercise Vital Sign    Days of Exercise per Week: 0 days    Minutes of Exercise per Session: 0 min  Stress: No Stress Concern Present (07/21/2022)   [employer] of Occupational Health - Occupational Stress Questionnaire    Feeling of Stress : Not at all  Social Connections: Socially Isolated (07/21/2022)   Social Connection and Isolation Panel [NHANES]    Frequency of Communication with Friends and Family: Twice a week    Frequency of Social Gatherings with Friends and Family: Never    Attends Religious Services: Never    Database administrator or Organizations: No    Attends Engineer, structural: Never    Marital Status: Married    Tobacco Counseling Counseling given: Not Answered Tobacco comments: former light smoker; quit in his teens   Clinical Intake:  Pre-visit preparation completed: Yes                     Activities of Daily Living    08/10/2023    1:20 PM 12/31/2022   10:59 AM  In your present state of health, do you have any difficulty performing the following activities:  Hearing? 0 0  Vision? 0 0  Difficulty  concentrating or making decisions? 0 0  Walking or climbing stairs? 0 0  Dressing or bathing? 0 0  Doing errands, shopping? 0     Patient Care Team: Larae Grooms, NP as PCP - General Iran Ouch, MD as PCP - Cardiology (Cardiology) Isla Pence, OD as Consulting Physician (Optometry) Marvel Plan, Elana Alm as Physician Assistant (Urology) Shane Crutch, MD as Consulting Physician (Pulmonary Disease) Andee Poles, [facility] (Inactive) (Pharmacist) Zettie Pho, [facility] (Inactive) (Pharmacist)  Indicate any recent Medical Services you may have received from other than [facility] providers in the past year (date may be approximate).     Assessment:   This is a routine wellness examination for [employer].  Hearing/Vision screen No results found.   Goals Addressed   None   Depression Screen    01/27/2023   10:45 AM 10/28/2022   10:03 AM 07/21/2022    9:41 AM 06/25/2022    9:25 AM 05/27/2022    9:34 AM 03/01/2022    9:08 AM 12/01/2021    8:09 AM  PHQ 2/9 Scores  PHQ - 2 Score 4 0 0 0 0 0 0  PHQ- 9 Score 16  3 0 0 2 3    Fall Risk  08/10/2023    1:17 PM 01/27/2023   10:45 AM 12/01/2022   11:03 AM 10/28/2022   10:02 AM 07/21/2022    9:39 AM  Fall Risk   Falls in the past year? 0 0 0 0 0  Number falls in past yr:  0 0 0 0  Injury with Fall? 0 0 0 0 0  Risk for fall due to :  No Fall Risks No Fall Risks No Fall Risks Impaired balance/gait  Follow up  Falls evaluation completed Falls evaluation completed Falls evaluation completed Falls evaluation completed;Education provided;Falls prevention discussed         Cognitive Function:        08/10/2023    1:18 PM 07/21/2022    9:37 AM 07/15/2021    5:51 PM 12/13/2018    9:44 AM 01/24/2018    1:41 PM  6CIT Screen  What Year? 0 points 0 points 0 points 0 points 0 points  What month? 0 points 0 points 0 points 0 points 0 points  What time? 3 points 0 points 0 points 0 points 0 points  Count back from 20 0 points  0 points 0 points 0 points 0 points  Months in reverse 4 points 4 points 0 points 0 points 0 points  Repeat phrase 0 points 0 points 0 points 0 points 0 points  Total Score 7 points 4 points 0 points 0 points 0 points    Immunizations Immunization History  Administered Date(s) Administered   Fluad Quad(high Dose 65+) 06/11/2019, 08/10/2021, 07/14/2022   Fluad Trivalent(High Dose 65+) 07/19/2023   Influenza, High Dose Seasonal PF 07/17/2015, 07/29/2016, 07/15/2017   Influenza, Seasonal, Injecte, Preservative Fre 08/14/2008   Influenza,inj,Quad PF,6+ Mos 07/20/2013, 06/20/2014   Influenza,inj,quad, With Preservative 08/18/2018   Influenza-Unspecified 08/18/2018, 08/07/2020   Moderna Covid-19 Fall Seasonal Vaccine 38yrs & older 07/19/2023   Moderna Sars-Covid-2 Vaccination 11/28/2019, 12/26/2019, 09/17/2020   Pneumococcal Conjugate-13 08/15/2014   Pneumococcal Polysaccharide-23 05/11/2012   Tdap 05/29/2014   Zoster Recombinant(Shingrix) 08/10/2022, 11/10/2022    TDAP status: Up to date  Flu Vaccine status: Up to date  Pneumococcal vaccine status: Up to date  Covid-19 vaccine status: Completed vaccines  Qualifies for Shingles Vaccine? Yes   Zostavax completed No   Shingrix Completed?: No.    Education has been provided regarding the importance of this vaccine. Patient has been advised to call insurance company to determine out of pocket expense if they have not yet received this vaccine. Advised may also receive vaccine at local pharmacy or Health Dept. Verbalized acceptance and understanding.  Screening Tests Health Maintenance  Topic Date Due   HEMOGLOBIN A1C  08/02/2023   Diabetic kidney evaluation - Urine ACR  10/29/2023   FOOT EXAM  10/29/2023   COVID-19 Vaccine (5 - 2023-24 season) 11/19/2023   Diabetic kidney evaluation - eGFR measurement  01/31/2024   OPHTHALMOLOGY EXAM  02/15/2024   DTaP/Tdap/Td (2 - Td or Tdap) 05/29/2024   Medicare Annual Wellness (AWV)   08/09/2024   Colonoscopy  06/30/2027   Pneumonia Vaccine 46+ Years old  Completed   INFLUENZA VACCINE  Completed   Hepatitis C Screening  Completed   Zoster Vaccines- Shingrix  Completed   HPV VACCINES  Aged Out    Health Maintenance  Health Maintenance Due  Topic Date Due   HEMOGLOBIN A1C  08/02/2023    Colorectal cancer screening: Type of screening: Colonoscopy. Completed 06/29/22. Repeat every 5 years  Lung Cancer Screening: (Low Dose CT Chest recommended if  Age 72-80 years, 20 pack-year currently smoking OR have quit w/in 15years.) does not qualify.   Lung Cancer Screening Referral: NA  Additional Screening:  Hepatitis C Screening: does qualify; Completed   Vision Screening: Recommended annual ophthalmology exams for early detection of glaucoma and other disorders of the eye. Is the patient up to date with their annual eye exam?  Yes  Who is the provider or what is the name of the office in which the patient attends annual eye exams? Dr. Clydene Pugh If pt is not established with a provider, would they like to be referred to a provider to establish care? No .   Dental Screening: Recommended annual dental exams for proper oral hygiene  Diabetic Foot Exam: Diabetic Foot Exam: Completed    Community Resource Referral / Chronic Care Management: CRR required this visit?  No   CCM required this visit?  No     Plan:     I have personally reviewed and noted the following in the patient's chart:   Medical and social history Use of alcohol, tobacco or illicit drugs  Current medications and supplements including opioid prescriptions. Patient is not currently taking opioid prescriptions. Functional ability and status Nutritional status Physical activity Advanced directives List of other physicians Hospitalizations, surgeries, and ER visits in previous 12 months Vitals Screenings to include cognitive, depression, and falls Referrals and appointments  In addition, I have  reviewed and discussed with patient certain preventive protocols, quality metrics, and best practice recommendations. A written personalized care plan for preventive services as well as general preventive health recommendations were provided to patient.     Larae Grooms, NP   08/10/2023   After Visit Summary: (In Person-Printed) AVS printed and given to the patient  Nurse Notes:

## 2023-08-10 NOTE — Assessment & Plan Note (Signed)
Chronic. Does not use CPAP.  Does not want to get a different machine or new sleep study. Will continue to reassess at future visits. 

## 2023-08-10 NOTE — Assessment & Plan Note (Signed)
Labs ordered at visit today.  Will make recommendations based on lab results.   

## 2023-08-10 NOTE — Assessment & Plan Note (Signed)
Chronic. Continue with Statin therapy. 

## 2023-08-10 NOTE — Assessment & Plan Note (Signed)
Chronic.  Controlled.  Continue with current medication regimen of telmisartan and Clonidine. Labs ordered today. Will make recommendations based on lab results.

## 2023-08-10 NOTE — Assessment & Plan Note (Signed)
Chronic. Controlled.  Continue with current medication regimen of Atorvastatin '40mg'$  daily.  Return in 3 months.  Call sooner if concerns arise.

## 2023-08-11 ENCOUNTER — Telehealth: Payer: Self-pay

## 2023-08-11 NOTE — Progress Notes (Signed)
   Care Guide Note  08/11/2023 Name: Juan Le. MRN: [id number] DOB: May 10, 1946  Referred by: Larae Grooms, NP Reason for referral : Care Coordination (Outreach to schedule with Pharm d )   Juan Le. is a 77 y.o. year old male who is a primary care patient of Larae Grooms, NP. Juan Le. was referred to the pharmacist for assistance related to DM.    An unsuccessful telephone outreach was attempted today to contact the patient who was referred to the pharmacy team for assistance with medication assistance. Additional attempts will be made to contact the patient.   Penne Lash, RMA Care Guide [facility]  [place], [place] [address] Direct Dial: [phone] [email or web address]

## 2023-08-12 ENCOUNTER — Telehealth: Payer: Self-pay | Admitting: Nurse Practitioner

## 2023-08-12 LAB — LIPID PANEL
Chol/HDL Ratio: 4.4 ratio (ref 0.0–5.0)
Cholesterol, Total: 129 mg/dL (ref 100–199)
HDL: 29 mg/dL — ABNORMAL LOW (ref >39)
LDL Chol Calc (NIH): 62 mg/dL (ref 0–99)
Triglycerides: 235 mg/dL — ABNORMAL HIGH (ref 0–149)
VLDL Cholesterol Cal: 38 mg/dL (ref 5–40)

## 2023-08-12 LAB — COMPREHENSIVE METABOLIC PANEL
ALT: 51 [IU]/L — ABNORMAL HIGH (ref 0–44)
AST: 30 [IU]/L (ref 0–40)
Albumin: 4.5 g/dL (ref 3.8–4.8)
Alkaline Phosphatase: 67 [IU]/L (ref 44–121)
BUN/Creatinine Ratio: 19 (ref 10–24)
BUN: 19 mg/dL (ref 8–27)
Bilirubin Total: 0.4 mg/dL (ref 0.0–1.2)
CO2: 18 mmol/L — ABNORMAL LOW (ref 20–29)
Calcium: 9.6 mg/dL (ref 8.6–10.2)
Chloride: 102 mmol/L (ref 96–106)
Creatinine, Ser: 1 mg/dL (ref 0.76–1.27)
Globulin, Total: 2.5 g/dL (ref 1.5–4.5)
Glucose: 135 mg/dL — ABNORMAL HIGH (ref 70–99)
Potassium: 4.3 mmol/L (ref 3.5–5.2)
Sodium: 139 mmol/L (ref 134–144)
Total Protein: 7 g/dL (ref 6.0–8.5)
eGFR: 78 mL/min/{1.73_m2} (ref >59)

## 2023-08-12 LAB — CBC WITH DIFFERENTIAL/PLATELET
Basophils Absolute: 0.1 x10E3/uL (ref 0.0–0.2)
Basos: 1 %
EOS (ABSOLUTE): 0.1 x10E3/uL (ref 0.0–0.4)
Eos: 2 %
Hematocrit: 42.6 % (ref 37.5–51.0)
Hemoglobin: 14.1 g/dL (ref 13.0–17.7)
Immature Grans (Abs): 0 x10E3/uL (ref 0.0–0.1)
Immature Granulocytes: 0 %
Lymphocytes Absolute: 2.8 x10E3/uL (ref 0.7–3.1)
Lymphs: 36 %
MCH: 32 pg (ref 26.6–33.0)
MCHC: 33.1 g/dL (ref 31.5–35.7)
MCV: 97 fL (ref 79–97)
Monocytes Absolute: 0.7 x10E3/uL (ref 0.1–0.9)
Monocytes: 9 %
Neutrophils Absolute: 4.1 x10E3/uL (ref 1.4–7.0)
Neutrophils: 52 %
Platelets: 227 x10E3/uL (ref 150–450)
RBC: 4.4 x10E6/uL (ref 4.14–5.80)
RDW: 13.5 % (ref 11.6–15.4)
WBC: 7.8 x10E3/uL (ref 3.4–10.8)

## 2023-08-12 LAB — HEMOGLOBIN A1C
Est. average glucose Bld gHb Est-mCnc: 123 mg/dL
Hgb A1c MFr Bld: 5.9 % — ABNORMAL HIGH (ref 4.8–5.6)

## 2023-08-12 LAB — VITAMIN B12: Vitamin B-12: 314 pg/mL (ref 232–1245)

## 2023-08-12 NOTE — Telephone Encounter (Signed)
Copied from CRM [id number]. Topic: General - Other >> Aug 11, 2023 12:43 PM Jannifer Rodney M wrote: Reason for CRM: Pt spouse returned call to the office from a missed call pt received. Cb# [phone]

## 2023-08-18 ENCOUNTER — Ambulatory Visit: Payer: Medicare Other | Admitting: Cardiovascular Disease

## 2023-08-23 ENCOUNTER — Other Ambulatory Visit: Payer: Medicare Other

## 2023-08-23 NOTE — Progress Notes (Signed)
08/23/2023 Name: Juan Le. MRN: [id number] DOB: 27-Dec-1945  Chief Complaint  Patient presents with   Medication Assistance   Juan Dawes Jiovany Scheffel. is a 77 y.o. year old male who presented for a telephone visit.   They were referred to the pharmacist by their PCP for assistance in managing medication access.   Subjective:  Care Team: Primary Care Provider: Larae Grooms, NP ; Next Scheduled Visit: 02/08/24  Medication Access/Adherence  Current Pharmacy:  [facility] - [place], [place] - [address] [place] [address] Phone: [phone] Fax: [phone]  [facility] - [place], [place] - [address] [facility] [address] [place] [address] Phone: [phone] Fax: [phone]  [facility] - [place], [place] - [address] [place] [address] Phone: [phone] Fax: [phone]  -Patient reports affordability concerns with their medications: Yes  -Patient reports access/transportation concerns to their pharmacy: No  -Patient reports adherence concerns with their medications:  Yes    Diabetes: Current medications: Farxiga 10mg  daily, Trulicity 0.75mg  weekly, metformin 1000mg  BID -Patient has not been taking [place] 10mg  due to cost; he was receiving through AZ[employer]Me PAP but is unsure of current enrollment status -Patient does not check home BG since recent A1c values have been at goal -Patient denies hypoglycemic s/sx including dizziness, shakiness, sweating.  -Patient denies hyperglycemic symptoms including polyuria, polydipsia, polyphagia, nocturia, neuropathy, blurred vision. -Current medication access support: [employer] PAP for Trulicity   Objective:  Lab Results  Component Value Date   HGBA1C 5.9 (H) 08/10/2023   Lab Results  Component Value Date   CREATININE 1.00 08/10/2023   BUN 19 08/10/2023   NA 139 08/10/2023   K  4.3 08/10/2023   CL 102 08/10/2023   CO2 18 (L) 08/10/2023   Medications Reviewed Today     Reviewed by Lenna Gilford, RPH (Pharmacist) on 08/23/23 at 1313  Med List Status: <None>   Medication Order Taking? Sig Documenting Provider Last Dose Status Informant  aspirin 81 MG tablet [id number]  Take 1 tablet by mouth daily. [provider]  Active            Med Note Franki Cabot Jun 12, 2018  8:09 AM)    atorvastatin (LIPITOR) 40 MG tablet [id number]  TAKE 1 TABLET BY MOUTH DAILY Larae Grooms, NP  Active   Blood Glucose Monitoring Suppl (ONETOUCH VERIO FLEX SYSTEM) w/Device KIT [id number]  USE UP TO 4 TIMES DAILY AS  DIRECTED Cannady, Jolene T, NP  Active   carvedilol (COREG) 6.25 MG tablet [id number]  Take 1 tablet (6.25 mg total) by mouth 2 (two) times daily. Iran Ouch, MD  Active   dapagliflozin propanediol (FARXIGA) 10 MG TABS tablet [id number] No Take 1 tablet (10 mg total) by mouth daily before breakfast.  Patient not taking: Reported on 08/23/2023   Larae Grooms, NP Not Taking Active   Dulaglutide (TRULICITY) 0.75 MG/0.5ML SOPN [id number] Yes Inject 0.75 mg into the skin once a week. Larae Grooms, NP Taking Active            Med Note Littie Deeds, Adren Dollins A   Tue Aug 23, 2023  1:12 PM) PAP  gabapentin (NEURONTIN) 400 MG capsule [id number]  TAKE 1 CAPSULE BY MOUTH 3 TIMES  DAILY Larae Grooms, NP  Active   Lancets (ONETOUCH DELICA PLUS [place]) MISC  [id number]  USE AS DIRECTED TO CHECK  BLOOD GLUCOSE ONCE DAILY Laural Benes, Megan P, DO  Active   metFORMIN (GLUCOPHAGE) 1000 MG tablet [id number] Yes Take 1 tablet (1,000 mg total) by mouth 2 (two) times daily with a meal. Larae Grooms, NP Taking Active   omeprazole (PRILOSEC) 40 MG capsule [id number]  Take 1 capsule (40 mg total) by mouth daily. Larae Grooms, NP  Active   [facility] VERIO test strip [id number]  USE AS INSTRUCTED  TO TEST  BLOOD SUGAR TWO TIMES DAILY Beryle Flock Marzella Schlein, MD  Active    QUEtiapine (SEROQUEL) 200 MG tablet [id number]  Take 1 tablet (200 mg total) by mouth at bedtime. Larae Grooms, NP  Active   telmisartan (MICARDIS) 40 MG tablet [id number]  Take 1 tablet (40 mg total) by mouth daily. Larae Grooms, NP  Active            Assessment/Plan:   Diabetes: - Currently controlled - Recommend to continue current regimen and resume Farxiga 10mg  daily - Contacted AZ[employer]Me PAP, and patient's enrollment ended August of this year; and he will need to reapply.  Coordinating with the medication assistance team to initiate application process.  Follow Up Plan:  Will monitor progress of PAP and keep patient/provider aware  Lenna Gilford, PharmD, DPLA

## 2023-08-23 NOTE — Progress Notes (Signed)
   Care Guide Note  08/23/2023 Name: Juan Le. MRN: [id number] DOB: [birth year]  Referred by: Larae Grooms, NP Reason for referral : Care Coordination (Outreach to schedule with Pharm d )   Juan Le. is a 77 y.o. year old male who is a primary care patient of Larae Grooms, NP. Juan Le. was referred to the pharmacist for assistance related to HTN and HLD.    Successful contact was made with the patient to discuss pharmacy services including being ready for the pharmacist to call at least 5 minutes before the scheduled appointment time, to have medication bottles and any blood sugar or blood pressure readings ready for review. The patient agreed to meet with the pharmacist via with the pharmacist via telephone visit on (date/time).  08/23/2023  Penne Lash, RMA Care Guide [facility]  [place], [place] [address] Direct Dial: [phone] [email or web address]

## 2023-08-26 NOTE — Progress Notes (Signed)
   08/26/2023  Patient ID: Juan Coots., male   DOB: [birth year], 77 y.o.   MRN: [id number]  Outreach attempt to inform Juan Le his AZ&Me PAP for [name] ended in August, and the medication assistance team will work with him on re-enrollment.  I was not able to reach the patient or leave a voicemail, but I am sending a MyChart message with this information.  Lenna Gilford, PharmD, DPLA

## 2023-08-31 NOTE — Progress Notes (Signed)
Cardiology Clinic Note   Date: 09/02/2023 ID: Juan Coots., DOB 07-08-1946, MRN [id number]  Primary Cardiologist:  Lorine Bears, MD  Patient Profile    Juan Coots. is a 77 y.o. male who presents to the clinic today for routine follow up.     Past medical history significant for: CAD. CABG x 4 1997 at [facility]: LIMA to LAD, SVG to RPDA, SVG to OM, SVG to D1. LHC 2008: Patent LIMA to LAD, SVG to RPDA, SVG to OM.  SVG to D1 with 90% ostial stenosis.  PCI with DES to SVG to D1. LHC 12/31/2022 (abnormal stress test): Severe native three-vessel CAD with 3 out of 4 occluded grafts.  Only patent graft is LIMA to LAD.  The native RCA has moderate proximal disease and is occluded distally.  Well-developed left-to-right collaterals are noted.  Most of the OM branches are also occluded with right to left collaterals from RV marginal branch.  Mildly reduced LV systolic function and mildly elevated LVEDP. Echo 01/24/2023: 60%.  No RWMA.  Grade I DD.  Normal RV size/function.  Mild MR.  Aortic valve sclerosis without stenosis. Hypertension. Hyperlipidemia. Lipid panel 08/10/2023: LDL 62, HDL 29, TG 235, total 129. COPD. OSA. GERD. T2DM.  In summary, patient with a history of CAD s/p CABG x 4 in 1997 performed at [facility].  LHC in 2008 showed significant underlying three-vessel CAD with patent grafts x 3 and 90% ostial stenosis in SVG to D1 s/p PCI with DES.  Patient was seen in the clinic by Dr. Kirke Corin on 12/14/2022 with complaints of episodic chest pain and dyspnea with minimal activities.  He underwent stress test which showed evidence of prior inferior and inferolateral infarct as well as an anterior lateral ischemia with moderately reduced EF.  He underwent LHC 12/31/2022 which showed severe native three-vessel CAD with 3 out of 4 occluded grafts (details above).  Medical treatment was recommended as there were no good targets for PCI of the native vessels and no good targets  for redo CABG.     History of Present Illness    Juan Coots. is followed by Dr. Kirke Corin for the above outlined history.  In the office by Charlsie Quest, NP on 05/17/2023 for routine follow-up.  He was doing well at that time with no cardiac complaints.  No medication changes were made.   Discussed the use of AI scribe software for clinical note transcription with the patient, who gave verbal consent to proceed.  The patient is accompanied by his wife. He presents for a routine follow-up. Patient denies shortness of breath, dyspnea on exertion, lower extremity edema, orthopnea or PND. No chest pain, pressure, or tightness. No palpitations.  He is able to perform normal activities around the house and grocery shop without any chest pain or shortness of breath. He admits to being mostly sedentary. He does a few things around the house but does has stopped doing yard work. He does not monitor his salt intake. He voices no concerns today.        ROS: All other systems reviewed and are otherwise negative except as noted in History of Present Illness.  Studies Reviewed    EKG Interpretation Date/Time:  Friday September 02 2023 08:06:41 EST Ventricular Rate:  57 PR Interval:  208 QRS Duration:  88 QT Interval:  426 QTC Calculation: 414 R Axis:   30  Text Interpretation: Sinus bradycardia with occasional Premature ventricular complexes Low voltage QRS  Cannot rule out Anterior infarct (cited on or before 10-Dec-2006) When compared with ECG of 17-May-2023 08:44, Premature ventricular complexes are now Present PR interval has decreased Confirmed by Carlos Levering [phone]) on 09/02/2023 8:14:54 AM        Physical Exam    VS:  BP 120/62 (BP Location: Left Arm, Patient Position: Sitting, Cuff Size: Large)   Pulse (!) 57   Ht 5\' 10"  (1.778 m)   Wt 210 lb 3.2 oz (95.3 kg)   SpO2 99%   BMI 30.16 kg/m  , BMI Body mass index is 30.16 kg/m.  GEN: Well nourished, well developed, in no  acute distress. Neck: No JVD or carotid bruits. Cardiac:  RRR. No murmurs. No rubs or gallops.   Respiratory:  Respirations regular and unlabored. Clear to auscultation without rales, wheezing or rhonchi. GI: Soft, nontender, nondistended. Extremities: Radials/DP/PT 2+ and equal bilaterally. No clubbing or cyanosis. No edema.  Skin: Warm and dry, no rash. Neuro: Strength intact.  Assessment & Plan       CAD CABG x 4 1997 at [facility], PCI with DES to SVG to D1 2008.  LHC March 2024 showed severe native three-vessel CAD with 3 of 4 grafts occluded and no good targets for PCI or redo CABG.  Patient denies chest pain, pressure or tightness. EKG shows sinus bradycardia with PVCs. He denies palpitations.  -Continue aspirin, carvedilol, atorvastatin.  Hypertension BP today 120/62.  -Continue carvedilol, telmisartan.  Hyperlipidemia LDL October 2024 62, at goal.   -Continue atorvastatin.       Disposition: Return in 6 months or sooner as needed.          Signed, Etta Grandchild. May Manrique, DNP, NP-C

## 2023-09-02 ENCOUNTER — Encounter: Payer: Self-pay | Admitting: Student

## 2023-09-02 ENCOUNTER — Ambulatory Visit: Payer: Medicare Other | Attending: Student | Admitting: Student

## 2023-09-02 VITALS — BP 120/62 | HR 57 | Ht 70.0 in | Wt 210.2 lb

## 2023-09-02 DIAGNOSIS — I10 Essential (primary) hypertension: Secondary | ICD-10-CM

## 2023-09-02 DIAGNOSIS — I25810 Atherosclerosis of coronary artery bypass graft(s) without angina pectoris: Secondary | ICD-10-CM | POA: Diagnosis not present

## 2023-09-02 DIAGNOSIS — E785 Hyperlipidemia, unspecified: Secondary | ICD-10-CM

## 2023-09-02 DIAGNOSIS — Z951 Presence of aortocoronary bypass graft: Secondary | ICD-10-CM

## 2023-09-02 NOTE — Patient Instructions (Signed)
Medication Instructions:  No changes *If you need a refill on your cardiac medications before your next appointment, please call your pharmacy*   Lab Work: None ordered If you have labs (blood work) drawn today and your tests are completely normal, you will receive your results only by: MyChart Message (if you have MyChart) OR A paper copy in the mail If you have any lab test that is abnormal or we need to change your treatment, we will call you to review the results.   Testing/Procedures: None ordered   Follow-Up: At [facility], you and your health needs are our priority.  As part of our continuing mission to provide you with exceptional heart care, we have created designated Provider Care Teams.  These Care Teams include your primary Cardiologist (physician) and Advanced Practice Providers (APPs -  Physician Assistants and Nurse Practitioners) who all work together to provide you with the care you need, when you need it.  We recommend signing up for the patient portal called "MyChart".  Sign up information is provided on this After Visit Summary.  MyChart is used to connect with patients for Virtual Visits (Telemedicine).  Patients are able to view lab/test results, encounter notes, upcoming appointments, etc.  Non-urgent messages can be sent to your provider as well.   To learn more about what you can do with MyChart, go to [email or web address].    Your next appointment:   6 month(s)  Provider:   You may see Lorine Bears, MD or one of the following Advanced Practice Providers on your designated Care Team:   Carlos Levering, NP

## 2023-09-12 NOTE — Progress Notes (Signed)
   09/12/2023  Patient ID: Juan Le., male   DOB: 12-Feb-1946, 77 y.o.   MRN: [id number]  Completed AZ&Me PAP re-enrollment application for Farxiga 10mg .  Contacted patient to arrange reviewing, signing and dating of application and check on re-enrollment for Lilly PAP for Trulicity for 2025.  Patient is coming in to sign [place] application 12/4 around 11am.  He has not heard anything about Trulicity renewal for 2025, so I will work on this as well; so he can sign paperwork for this program at the same time.  Lenna Gilford, PharmD, DPLA

## 2023-09-12 NOTE — Progress Notes (Signed)
ERROR

## 2023-09-16 NOTE — Progress Notes (Signed)
   09/16/2023  Patient ID: Juan Coots., male   DOB: Jan 18, 1946, 77 y.o.   MRN: [id number]  Submitted patient portion of Lilly Cares PAP application for 2025 re-enrollment online, and provider portion has been emailed to PCP to complete.  Lenna Gilford, PharmD, DPLA

## 2023-09-22 ENCOUNTER — Telehealth: Payer: Self-pay

## 2023-09-22 NOTE — Progress Notes (Signed)
   09/22/2023  Patient ID: Juan Coots., male   DOB: 1946-02-01, 77 y.o.   MRN: [id number]  Outreach attempt to arrange signing of AZ&Me 2025 PAP re-enrollment application for [place].  Patient came in to sign last week when I was not at the office with the paperwork and was gong to come back yesterday while I was there but did not.  I was not able to reach Juan Le, but I will try to call again early next week.  Lenna Gilford, PharmD, DPLA

## 2023-09-23 ENCOUNTER — Other Ambulatory Visit: Payer: Self-pay | Admitting: Nurse Practitioner

## 2023-09-26 ENCOUNTER — Telehealth: Payer: Self-pay

## 2023-09-26 NOTE — Progress Notes (Signed)
   09/26/2023  Patient ID: [name], male   DOB: 01-May-1946, 77 y.o.   MRN: [id number]  Outreach attempt to reschedule time for patient to sign AZ&Me re-enrollment application for 2025 assistance with [place].  Patient was going to come by when I was at [facility] last week but did not, and I have not been able to reach him to coordinate another time.  His voicemail is full, but I was able to leave a message with my direct number on his wife's voicemail for him to call me back.  Lenna Gilford, PharmD, DPLA

## 2023-09-26 NOTE — Progress Notes (Signed)
   09/26/2023  Patient ID: Juan Coots., male   DOB: 11-05-1945, 77 y.o.   MRN: [id number]  Patient returned my call and will be coming by tomorrow around 1pm to sign paperwork.  Lenna Gilford, PharmD, DPLA

## 2023-09-26 NOTE — Telephone Encounter (Signed)
Requested medications are due for refill today.  yes  Requested medications are on the active medications list.  yes  Last refill. 10/28/2022 #90 3 rf  Future visit scheduled.   yes  Notes to clinic.  Refill not delegated.    Requested Prescriptions  Pending Prescriptions Disp Refills   QUEtiapine (SEROQUEL) 200 MG tablet [Pharmacy Med Name: QUETIAPINE  200MG   TAB] 90 tablet 3    Sig: TAKE 1 TABLET BY MOUTH AT  BEDTIME     Not Delegated - Psychiatry:  Antipsychotics - Second Generation (Atypical) - quetiapine Failed - 09/23/2023 10:44 PM      Failed - This refill cannot be delegated      Failed - TSH in normal range and within 360 days    TSH  Date Value Ref Range Status  05/27/2022 8.240 (H) 0.450 - 4.500 uIU/mL Final         Failed - Lipid Panel in normal range within the last 12 months    Cholesterol, Total  Date Value Ref Range Status  08/10/2023 129 100 - 199 mg/dL Final   LDL Cholesterol (Calc)  Date Value Ref Range Status  11/29/2017 68 mg/dL (calc) Final    Comment:    Reference range: <100 . Desirable range <100 mg/dL for primary prevention;   <70 mg/dL for patients with CHD or diabetic patients  with > or = 2 CHD risk factors. Marland Kitchen LDL-C is now calculated using the Martin-Hopkins  calculation, which is a validated novel method providing  better accuracy than the Friedewald equation in the  estimation of LDL-C.  Horald Pollen et al. Lenox Ahr. 5284;132(44): 2061-2068  (http://education.[employer].com/faq/FAQ164)    LDL Chol Calc (NIH)  Date Value Ref Range Status  08/10/2023 62 0 - 99 mg/dL Final   HDL  Date Value Ref Range Status  08/10/2023 29 (L) >39 mg/dL Final   Triglycerides  Date Value Ref Range Status  08/10/2023 235 (H) 0 - 149 mg/dL Final         Passed - Last BP in normal range    BP Readings from Last 1 Encounters:  09/02/23 120/62         Passed - Last Heart Rate in normal range    Pulse Readings from Last 1 Encounters:  09/02/23  (!) 57         Passed - Valid encounter within last 6 months    Recent Outpatient Visits           1 month ago Encounter for [employer] annual wellness exam   [place] [facility], Clydie Braun, NP   8 months ago Hypertension associated with diabetes [facility])   [place] [facility] Larae Grooms, NP   9 months ago Aortic atherosclerosis [facility])   [place] [facility] Larae Grooms, NP   11 months ago Aortic atherosclerosis [facility])   [place] [facility] Larae Grooms, NP   1 year ago Hypertension associated with diabetes [facility])   [place] [facility] Larae Grooms, NP       Future Appointments             In 4 months Larae Grooms, NP [facility]            Passed - CBC within normal limits and completed in the last 12 months    WBC  Date Value Ref Range Status  08/10/2023 7.8 3.4 - 10.8 x10E3/uL Final  12/29/2022 11.2 (H)  4.0 - 10.5 K/uL Final   RBC  Date Value Ref Range Status  08/10/2023 4.40 4.14 - 5.80 x10E6/uL Final  12/29/2022 5.03 4.22 - 5.81 MIL/uL Final   Hemoglobin  Date Value Ref Range Status  08/10/2023 14.1 13.0 - 17.7 g/dL Final   Hematocrit  Date Value Ref Range Status  08/10/2023 42.6 37.5 - 51.0 % Final   MCHC  Date Value Ref Range Status  08/10/2023 33.1 31.5 - 35.7 g/dL Final  95/28/4132 44.0 30.0 - 36.0 g/dL Final   [facility]  Date Value Ref Range Status  08/10/2023 32.0 26.6 - 33.0 pg Final  12/29/2022 30.2 26.0 - 34.0 pg Final   MCV  Date Value Ref Range Status  08/10/2023 97 79 - 97 fL Final   No results found for: "PLTCOUNTKUC", "LABPLAT", "POCPLA" RDW  Date Value Ref Range Status  08/10/2023 13.5 11.6 - 15.4 % Final         Passed - CMP within normal limits and completed in the last 12 months    Albumin  Date Value Ref Range Status  08/10/2023 4.5 3.8 - 4.8 g/dL Final   Alkaline Phosphatase  Date  Value Ref Range Status  08/10/2023 67 44 - 121 IU/L Final   Alkaline phosphatase (APISO)  Date Value Ref Range Status  11/29/2017 55 40 - 115 U/L Final   ALT  Date Value Ref Range Status  08/10/2023 51 (H) 0 - 44 IU/L Final   AST  Date Value Ref Range Status  08/10/2023 30 0 - 40 IU/L Final   BUN  Date Value Ref Range Status  08/10/2023 19 8 - 27 mg/dL Final   Calcium  Date Value Ref Range Status  08/10/2023 9.6 8.6 - 10.2 mg/dL Final   CO2  Date Value Ref Range Status  08/10/2023 18 (L) 20 - 29 mmol/L Final   Creat  Date Value Ref Range Status  11/29/2017 1.03 0.70 - 1.18 mg/dL Final    Comment:    For patients >40 years of age, the reference limit for Creatinine is approximately 13% higher for people identified as African-American. .    Creatinine, Ser  Date Value Ref Range Status  08/10/2023 1.00 0.76 - 1.27 mg/dL Final   Creatinine, Urine  Date Value Ref Range Status  04/21/2017 206 20 - 370 mg/dL Final   Glucose  Date Value Ref Range Status  08/10/2023 135 (H) 70 - 99 mg/dL Final  08/14/2535 644 mg/dL Final   Glucose, Bld  Date Value Ref Range Status  12/29/2022 104 (H) 70 - 99 mg/dL Final    Comment:    Glucose reference range applies only to samples taken after fasting for at least 8 hours.   POC Glucose  Date Value Ref Range Status  03/22/2017 125 (A) 70 - 99 mg/dl Final   Glucose-Capillary  Date Value Ref Range Status  12/31/2022 106 (H) 70 - 99 mg/dL Final    Comment:    Glucose reference range applies only to samples taken after fasting for at least 8 hours.   Potassium  Date Value Ref Range Status  08/10/2023 4.3 3.5 - 5.2 mmol/L Final   Sodium  Date Value Ref Range Status  08/10/2023 139 134 - 144 mmol/L Final   Bilirubin Total  Date Value Ref Range Status  08/10/2023 0.4 0.0 - 1.2 mg/dL Final   Bilirubin, Total  Date Value Ref Range Status  11/12/2014 0.4 mg/dL Final   Bilirubin, Direct  Date Value Ref Range  Status   02/05/2016 0.13 0.00 - 0.40 mg/dL Final   Protein,UA  Date Value Ref Range Status  05/27/2022 Negative Negative/Trace Final   Total Protein  Date Value Ref Range Status  08/10/2023 7.0 6.0 - 8.5 g/dL Final   GFR, Est African American  Date Value Ref Range Status  04/21/2017 86 >=60 mL/min Final   GFR calc Af Amer  Date Value Ref Range Status  12/05/2020 91 >59 mL/min/1.73 Final    Comment:    **In accordance with recommendations from the NKF-ASN Task force,**   [facility] is in the process of updating its eGFR calculation to the   2021 CKD-EPI creatinine equation that estimates kidney function   without a race variable.    eGFR  Date Value Ref Range Status  08/10/2023 78 >59 mL/min/1.73 Final   GFR, Est Non African American  Date Value Ref Range Status  04/21/2017 74 >=60 mL/min Final   GFR, Estimated  Date Value Ref Range Status  12/29/2022 >60 >60 mL/min Final    Comment:    (NOTE) Calculated using the CKD-EPI Creatinine Equation (2021)

## 2023-10-10 ENCOUNTER — Other Ambulatory Visit: Payer: Self-pay | Admitting: Nurse Practitioner

## 2023-10-10 DIAGNOSIS — E1142 Type 2 diabetes mellitus with diabetic polyneuropathy: Secondary | ICD-10-CM

## 2023-10-11 NOTE — Telephone Encounter (Signed)
Requested Prescriptions  Pending Prescriptions Disp Refills   gabapentin (NEURONTIN) 400 MG capsule [Pharmacy Med Name: Gabapentin 400 MG Oral Capsule] 300 capsule 2    Sig: TAKE 1 CAPSULE BY MOUTH 3 TIMES  DAILY     Neurology: Anticonvulsants - gabapentin Passed - 10/11/2023  3:01 PM      Passed - Cr in normal range and within 360 days    Creat  Date Value Ref Range Status  11/29/2017 1.03 0.70 - 1.18 mg/dL Final    Comment:    For patients >49 years of age, the reference limit for Creatinine is approximately 13% higher for people identified as African-American. .    Creatinine, Ser  Date Value Ref Range Status  08/10/2023 1.00 0.76 - 1.27 mg/dL Final   Creatinine, Urine  Date Value Ref Range Status  04/21/2017 206 20 - 370 mg/dL Final         Passed - Completed PHQ-2 or PHQ-9 in the last 360 days      Passed - Valid encounter within last 12 months    Recent Outpatient Visits           2 months ago Encounter for [employer] annual wellness exam   [place] [facility] Larae Grooms, NP   8 months ago Hypertension associated with diabetes [facility])   [place] [facility] Larae Grooms, NP   10 months ago Aortic atherosclerosis [facility])   [place] [facility] Larae Grooms, NP   11 months ago Aortic atherosclerosis [facility])   [place] [facility] Larae Grooms, NP   1 year ago Hypertension associated with diabetes [facility])    [facility] Larae Grooms, NP       Future Appointments             In 4 months Larae Grooms, NP  [facility], [facility]

## 2023-10-12 ENCOUNTER — Other Ambulatory Visit: Payer: Self-pay | Admitting: Nurse Practitioner

## 2023-10-14 NOTE — Telephone Encounter (Signed)
Requested medication (s) are due for refill today: Yes  Requested medication (s) are on the active medication list: Yes  Last refill:  03/09/21  Future visit scheduled: Yes  Notes to clinic:  See request.    Requested Prescriptions  Pending Prescriptions Disp Refills   TRULICITY 0.75 MG/0.5ML SOAJ [Pharmacy Med Name: Trulicity Subcutaneous Solution Auto-injector 0.75 MG/0.5ML] 8 mL PRN    Sig: INJECT 0.75MG  (0.5ML) UNDER THE SKIN ONCE A WEEK.     Endocrinology:  Diabetes - GLP-1 Receptor Agonists Passed - 10/14/2023  7:47 AM      Passed - HBA1C is between 0 and 7.9 and within 180 days    HB A1C (BAYER DCA - WAIVED)  Date Value Ref Range Status  12/05/2020 6.9 <7.0 % Final    Comment:                                          Diabetic Adult            <7.0                                       Healthy Adult        4.3 - 5.7                                                           (DCCT/NGSP) American Diabetes Association's Summary of Glycemic Recommendations for Adults with Diabetes: Hemoglobin A1c <7.0%. More stringent glycemic goals (A1c <6.0%) may further reduce complications at the cost of increased risk of hypoglycemia.    Hgb A1c MFr Bld  Date Value Ref Range Status  08/10/2023 5.9 (H) 4.8 - 5.6 % Final    Comment:             Prediabetes: 5.7 - 6.4          Diabetes: >6.4          Glycemic control for adults with diabetes: <7.0          Passed - Valid encounter within last 6 months    Recent Outpatient Visits           2 months ago Encounter for [employer] annual wellness exam   [place] [facility] Larae Grooms, NP   8 months ago Hypertension associated with diabetes [facility])   [place] [facility] Larae Grooms, NP   10 months ago Aortic atherosclerosis [facility])   [place] [facility] Larae Grooms, NP   11 months ago Aortic atherosclerosis [facility])   [place] [facility] Larae Grooms,  NP   1 year ago Hypertension associated with diabetes [facility])   [place] [facility] Larae Grooms, NP       Future Appointments             In 3 months Larae Grooms, NP [place] [facility], [facility]

## 2023-10-21 ENCOUNTER — Other Ambulatory Visit: Payer: Self-pay | Admitting: Nurse Practitioner

## 2023-10-21 DIAGNOSIS — E1169 Type 2 diabetes mellitus with other specified complication: Secondary | ICD-10-CM

## 2023-10-21 DIAGNOSIS — E1142 Type 2 diabetes mellitus with diabetic polyneuropathy: Secondary | ICD-10-CM

## 2023-10-21 DIAGNOSIS — E785 Hyperlipidemia, unspecified: Secondary | ICD-10-CM

## 2023-10-25 NOTE — Telephone Encounter (Signed)
 Requested Prescriptions  Pending Prescriptions Disp Refills   metFORMIN  (GLUCOPHAGE ) 1000 MG tablet [Pharmacy Med Name: metFORMIN  HCl 1000 MG Oral Tablet] 200 tablet 2    Sig: TAKE 1 TABLET BY MOUTH TWICE  DAILY WITH MEALS     Endocrinology:  Diabetes - Biguanides Passed - 10/25/2023 11:49 AM      Passed - Cr in normal range and within 360 days    Creat  Date Value Ref Range Status  11/29/2017 1.03 0.70 - 1.18 mg/dL Final    Comment:    For patients >46 years of age, the reference limit for Creatinine is approximately 13% higher for people identified as African-American. .    Creatinine, Ser  Date Value Ref Range Status  08/10/2023 1.00 0.76 - 1.27 mg/dL Final   Creatinine, Urine  Date Value Ref Range Status  04/21/2017 206 20 - 370 mg/dL Final         Passed - HBA1C is between 0 and 7.9 and within 180 days    HB A1C (BAYER DCA - WAIVED)  Date Value Ref Range Status  12/05/2020 6.9 <7.0 % Final    Comment:                                          Diabetic Adult            <7.0                                       Healthy Adult        4.3 - 5.7                                                           (DCCT/NGSP) American Diabetes Association's Summary of Glycemic Recommendations for Adults with Diabetes: Hemoglobin A1c <7.0%. More stringent glycemic goals (A1c <6.0%) may further reduce complications at the cost of increased risk of hypoglycemia.    Hgb A1c MFr Bld  Date Value Ref Range Status  08/10/2023 5.9 (H) 4.8 - 5.6 % Final    Comment:             Prediabetes: 5.7 - 6.4          Diabetes: >6.4          Glycemic control for adults with diabetes: <7.0          Passed - eGFR in normal range and within 360 days    GFR, Est African American  Date Value Ref Range Status  04/21/2017 86 >=60 mL/min Final   GFR calc Af Amer  Date Value Ref Range Status  12/05/2020 91 >59 mL/min/1.73 Final    Comment:    **In accordance with recommendations from the NKF-ASN Task  force,**   [facility] is in the process of updating its eGFR calculation to the   2021 CKD-EPI creatinine equation that estimates kidney function   without a race variable.    GFR, Est Non African American  Date Value Ref Range Status  04/21/2017 74 >=60 mL/min Final   GFR, Estimated  Date Value Ref Range Status  12/29/2022 >60 >60 mL/min  Final    Comment:    (NOTE) Calculated using the CKD-EPI Creatinine Equation (2021)    eGFR  Date Value Ref Range Status  08/10/2023 78 >59 mL/min/1.73 Final         Passed - B12 Level in normal range and within 720 days    Vitamin B-12  Date Value Ref Range Status  08/10/2023 314 232 - 1,245 pg/mL Final         Passed - Valid encounter within last 6 months    Recent Outpatient Visits           2 months ago Encounter for Medicare annual wellness exam   [place] [facility] Melvin Pao, NP   9 months ago Hypertension associated with diabetes [facility])   [place] [facility]vin Pao, NP   10 months ago Aortic atherosclerosis [facility])   [place] [facility] Melvin Pao, NP   12 months ago Aortic atherosclerosis [facility])   [place] [facility] Melvin Pao, NP   1 year ago Hypertension associated with diabetes [facility])   [place] [facility] Melvin Pao, NP       Future Appointments             In 3 months Melvin Pao, NP [place] [facility]            Passed - CBC within normal limits and completed in the last 12 months    WBC  Date Value Ref Range Status  08/10/2023 7.8 3.4 - 10.8 x10E3/uL Final  12/29/2022 11.2 (H) 4.0 - 10.5 K/uL Final   RBC  Date Value Ref Range Status  08/10/2023 4.40 4.14 - 5.80 x10E6/uL Final  12/29/2022 5.03 4.22 - 5.81 MIL/uL Final   Hemoglobin  Date Value Ref Range Status  08/10/2023 14.1 13.0 - 17.7 g/dL Final   Hematocrit  Date Value Ref Range Status  08/10/2023  42.6 37.5 - 51.0 % Final   MCHC  Date Value Ref Range Status  08/10/2023 33.1 31.5 - 35.7 g/dL Final  96/86/7975 66.0 30.0 - 36.0 g/dL Final   [facility]  Date Value Ref Range Status  08/10/2023 32.0 26.6 - 33.0 pg Final  12/29/2022 30.2 26.0 - 34.0 pg Final   MCV  Date Value Ref Range Status  08/10/2023 97 79 - 97 fL Final   No results found for: PLTCOUNTKUC, LABPLAT, POCPLA RDW  Date Value Ref Range Status  08/10/2023 13.5 11.6 - 15.4 % Final          atorvastatin  (LIPITOR) 40 MG tablet [Pharmacy Med Name: Atorvastatin  Calcium  40 MG Oral Tablet] 100 tablet 2    Sig: TAKE 1 TABLET BY MOUTH DAILY     Cardiovascular:  Antilipid - Statins Failed - 10/25/2023 11:49 AM      Failed - Lipid Panel in normal range within the last 12 months    Cholesterol, Total  Date Value Ref Range Status  08/10/2023 129 100 - 199 mg/dL Final   LDL Cholesterol (Calc)  Date Value Ref Range Status  11/29/2017 68 mg/dL (calc) Final    Comment:    Reference range: <100 . Desirable range <100 mg/dL for primary prevention;   <70 mg/dL for patients with CHD or diabetic patients  with > or = 2 CHD risk factors. SABRA LDL-C is now calculated using the Martin-Hopkins  calculation, which is a validated novel method providing  better accuracy than the Friedewald equation in the  estimation  of LDL-C.  Gladis APPLETHWAITE et al. SANDREA. 7986;689(80): 2061-2068  (http://education.Quest[employer].com/faq/FAQ164)    LDL Chol Calc (NIH)  Date Value Ref Range Status  08/10/2023 62 0 - 99 mg/dL Final   HDL  Date Value Ref Range Status  08/10/2023 29 (L) >39 mg/dL Final   Triglycerides  Date Value Ref Range Status  08/10/2023 235 (H) 0 - 149 mg/dL Final         Passed - Patient is not pregnant      Passed - Valid encounter within last 12 months    Recent Outpatient Visits           2 months ago Encounter for [employer] annual wellness exam   [place] [facility] Melvin Pao, NP   9  months ago Hypertension associated with diabetes [facility])   [place] [facility] Pao, NP   10 months ago Aortic atherosclerosis [facility])   [place] [facility] Melvin Pao, NP   12 months ago Aortic atherosclerosis [facility])   [place] [facility] Melvin Pao, NP   1 year ago Hypertension associated with diabetes [facility])   [place] [facility] Melvin Pao, NP       Future Appointments             In 3 months Melvin Pao, NP [place] [facility], [facility]

## 2023-11-01 ENCOUNTER — Other Ambulatory Visit: Payer: Self-pay | Admitting: Nurse Practitioner

## 2023-11-01 DIAGNOSIS — I152 Hypertension secondary to endocrine disorders: Secondary | ICD-10-CM

## 2023-11-01 DIAGNOSIS — K219 Gastro-esophageal reflux disease without esophagitis: Secondary | ICD-10-CM

## 2023-11-02 ENCOUNTER — Telehealth: Payer: Self-pay | Admitting: Student

## 2023-11-02 NOTE — Telephone Encounter (Signed)
 The patient called to report that he has noticed an increase in his blood pressure over the last 3-4 days. He stated that his BP is normally within normal limit, but noted that last night it was 160/82. The patient mentioned that he does not keep a log but reports that his BP has been in the 160s. He also stated that he is asymptomatic.  The patient has an appointment scheduled for 11/07/23. The nurse recommended that the patient keep the scheduled appointment but will forward the message to [facility] for recommendations in the interim.

## 2023-11-02 NOTE — Telephone Encounter (Signed)
 Pt c/o BP issue: STAT if pt c/o blurred vision, one-sided weakness or slurred speech  1. What are your last 5 BP readings? 160/81 on yesterday  2. Are you having any other symptoms (ex. Dizziness, headache, blurred vision, passed out)? No  3. What is your BP issue? Bp has been elevated. Pt has taken bp but did not write down readings. Requesting appt.  Scheduled appt for 11/07/23

## 2023-11-02 NOTE — Telephone Encounter (Signed)
 Requested Prescriptions  Pending Prescriptions Disp Refills   omeprazole  (PRILOSEC) 40 MG capsule [Pharmacy Med Name: Omeprazole  40 MG Oral Capsule Delayed Release] 100 capsule 2    Sig: TAKE 1 CAPSULE BY MOUTH DAILY     Gastroenterology: Proton Pump Inhibitors Passed - 11/02/2023  3:11 PM      Passed - Valid encounter within last 12 months    Recent Outpatient Visits           2 months ago Encounter for [employer] annual wellness exam   [place] [facility] Aileen Alexanders, NP   9 months ago Hypertension associated with diabetes [facility])   [place] [facility] Aileen Alexanders, NP   11 months ago Aortic atherosclerosis [facility])   [place] [facility] Aileen Alexanders, NP   1 year ago Aortic atherosclerosis [facility])   [facility] Aile[facility] Alexanders, NP   1 year ago Hypertension associated with diabetes [facility])   [place] [facility] Aileen Alexanders, NP       Future Appointments             In 5 days [place], Bernardo Bridgeman, NP [place] [facility] [place]   In 3 months Aileen Alexanders, NP [place] [facility], [facility]             telmisartan  (MICARDIS ) 40 MG tablet [Pharmacy Med Name: Telmisartan  40 MG Oral Tablet] 100 tablet 0    Sig: TAKE 1 TABLET BY MOUTH DAILY     Cardiovascular:  Angiotensin Receptor Blockers Passed - 11/02/2023  3:11 PM      Passed - Cr in normal range and within 180 days    Creat  Date Value Ref Range Status  11/29/2017 1.03 0.70 - 1.18 mg/dL Final    Comment:    For patients >65 years of age, the reference limit for Creatinine is approximately 13% higher for people identified as African-American. .    Creatinine, Ser  Date Value Ref Range Status  08/10/2023 1.00 0.76 - 1.27 mg/dL Final   Creatinine, Urine  Date Value Ref Range Status  04/21/2017 206 20 - 370 mg/dL Final         Passed - K in normal range and within 180 days    Potassium   Date Value Ref Range Status  08/10/2023 4.3 3.5 - 5.2 mmol/L Final         Passed - Patient is not pregnant      Passed - Last BP in normal range    BP Readings from Last 1 Encounters:  09/02/23 120/62         Passed - Valid encounter within last 6 months    Recent Outpatient Visits           2 months ago Encounter for [employer] annual wellness exam   [place] [facility] Aileen Alexanders, NP   9 months ago Hypertension associated with diabetes [place] [facility])   [place] [facility] Aileen Alexanders, NP   11 months ago Aortic atherosclerosis [facility])   [place] [facility] Aileen Alexanders, NP   1 year ago Aortic atherosclerosis [facility])   [place] [facility] Aileen Alexanders, NP   1 year ago Hypertension associated with diabetes [facility])    [facility] Aile[facility] Alexand[facility], NP       Future Appointments             In 5 days Morey Ar, NP [facility] [place]   In 3 months Aileen Alexanders, NP [place] [facility], [facility]

## 2023-11-05 NOTE — Progress Notes (Unsigned)
Cardiology Clinic Note   Date: 11/07/2023 ID: Juan Coots., DOB October 16, 1946, MRN [id number]  Primary Cardiologist:  Lorine Bears, MD  Patient Profile    Juan Coots. is a 78 y.o. male who presents to the clinic today for evaluation of elevated BP.     Past medical history significant for: CAD. CABG x 4 1997 at [facility]: LIMA to LAD, SVG to RPDA, SVG to OM, SVG to D1. LHC 2008: Patent LIMA to LAD, SVG to RPDA, SVG to OM.  SVG to D1 with 90% ostial stenosis.  PCI with DES to SVG to D1. LHC 12/31/2022 (abnormal stress test): Severe native three-vessel CAD with 3 out of 4 occluded grafts.  Only patent graft is LIMA to LAD.  The native RCA has moderate proximal disease and is occluded distally.  Well-developed left-to-right collaterals are noted.  Most of the OM branches are also occluded with right to left collaterals from RV marginal branch.  Mildly reduced LV systolic function and mildly elevated LVEDP. Echo 01/24/2023: 60%.  No RWMA.  Grade I DD.  Normal RV size/function.  Mild MR.  Aortic valve sclerosis without stenosis. Hypertension. Hyperlipidemia. Lipid panel 08/10/2023: LDL 62, HDL 29, TG 235, total 129. COPD. OSA. GERD. T2DM.  In summary, patient with a history of CAD s/p CABG x 4 in 1997 performed at [facility].  LHC in 2008 showed significant underlying three-vessel CAD with patent grafts x 3 and 90% ostial stenosis in SVG to D1 s/p PCI with DES.  Patient was seen in the clinic by Dr. Kirke Corin on 12/14/2022 with complaints of episodic chest pain and dyspnea with minimal activities.  He underwent stress test which showed evidence of prior inferior and inferolateral infarct as well as an anterior lateral ischemia with moderately reduced EF.  He underwent LHC 12/31/2022 which showed severe native three-vessel CAD with 3 out of 4 occluded grafts (details above).  Medical treatment was recommended as there were no good targets for PCI of the native vessels and no good  targets for redo CABG.      History of Present Illness    Juan Coots. is followed by Dr. Kirke Corin for the above outlined history.  Patient was last seen in the office by me on 09/02/2023 for routine follow-up.  He was doing well at that time with well-controlled BP and no changes were made.  Patient contacted the office on 11/02/2023 with complaints of elevated BP.  Per triage RN patient noted elevated BP readings over 3 to 4 days.  He reported not keeping a log but noting SBP in the 160s.  Today, patient is accompanied by his wife. He reports home BP readings in the 150s-160s/50s-90s. He typically checks his BP prior to medications and then again in the evening. Patient denies headaches, dizziness, or vision changes.  BP today is 118/80 which is typically in his normal range. He and his wife wonder if their BP cuff is the issue. Discussed not wanting to make medication changes given the large difference between home BP and office BP. Patient and wife are in agreement. Discussed BP technique as well as checking BP 2 hours after morning medications. Patient is going to try that. If he continues to get high readings at home he will contact PCP to see if he can get in for a nurse visit and bring his cuff with him to be calibrated.      ROS: All other systems reviewed and are otherwise  negative except as noted in History of Present Illness.  EKGs/Labs Reviewed       EKG not ordered today.   08/10/2023: ALT 51; AST 30; BUN 19; Creatinine, Ser 1.00; Potassium 4.3; Sodium 139   08/10/2023: Hemoglobin 14.1; WBC 7.8   Physical Exam    VS:  BP 118/80 (BP Location: Left Arm, Patient Position: Sitting, Cuff Size: Normal)   Pulse 63   Ht 5\' 10"  (1.778 m)   Wt 205 lb 2 oz (93 kg)   SpO2 99%   BMI 29.43 kg/m  , BMI Body mass index is 29.43 kg/m.  GEN: Well nourished, well developed, in no acute distress. Neck: No JVD or carotid bruits. Cardiac: RRR. No murmurs. No rubs or gallops.    Respiratory:  Respirations regular and unlabored. Clear to auscultation without rales, wheezing or rhonchi. GI: Soft, nontender, nondistended. Extremities: Radials/DP/PT 2+ and equal bilaterally. No clubbing or cyanosis. No edema.  Skin: Warm and dry, no rash. Neuro: Strength intact.  Assessment & Plan   CAD CABG x 4 1997 at [facility], PCI with DES to SVG to D1 2008.  LHC March 2024 showed severe native three-vessel CAD with 3 of 4 grafts occluded and no good targets for PCI or redo CABG.  Patient denies chest pain, pressure or tightness. -Continue aspirin, carvedilol, atorvastatin.   Hypertension BP today 118/80 on intake and 110/70 on my recheck. These readings are more consistent with patient's past readings. Home readings ranging from 150-160s/50-90s. Discussed proper technique and taking BP 2 hours after morning medications. If he continues to get high readings at home he is going to contact PCP to see if he can get a nurse visit and bring his cuff to be calibrated. Patient and wife are in agreement with not making any changes today.   -Continue carvedilol, telmisartan.   Hyperlipidemia LDL October 2024 62, at goal.   -Continue atorvastatin.   Disposition: Follow up as previously planned or sooner as needed.          Signed, Etta Grandchild. Myka Hitz, DNP, NP-C

## 2023-11-07 ENCOUNTER — Encounter: Payer: Self-pay | Admitting: Student

## 2023-11-07 ENCOUNTER — Ambulatory Visit: Payer: Medicare Other | Attending: Student | Admitting: Student

## 2023-11-07 VITALS — BP 118/80 | HR 63 | Ht 70.0 in | Wt 205.1 lb

## 2023-11-07 DIAGNOSIS — E785 Hyperlipidemia, unspecified: Secondary | ICD-10-CM | POA: Diagnosis not present

## 2023-11-07 DIAGNOSIS — I10 Essential (primary) hypertension: Secondary | ICD-10-CM

## 2023-11-07 DIAGNOSIS — I2510 Atherosclerotic heart disease of native coronary artery without angina pectoris: Secondary | ICD-10-CM | POA: Diagnosis not present

## 2023-11-07 NOTE — Patient Instructions (Signed)
Medication Instructions:  Your physician recommends that you continue on your current medications as directed. Please refer to the Current Medication list given to you today.   *If you need a refill on your cardiac medications before your next appointment, please call your pharmacy*   No labs ordered today    Testing/Procedures: No test ordered today    Follow-Up: At [facility], you and your health needs are our priority.  As part of our continuing mission to provide you with exceptional heart care, we have created designated Provider Care Teams.  These Care Teams include your primary Cardiologist (physician) and Advanced Practice Providers (APPs -  Physician Assistants and Nurse Practitioners) who all work together to provide you with the care you need, when you need it.  We recommend signing up for the patient portal called "MyChart".  Sign up information is provided on this After Visit Summary.  MyChart is used to connect with patients for Virtual Visits (Telemedicine).  Patients are able to view lab/test results, encounter notes, upcoming appointments, etc.  Non-urgent messages can be sent to your provider as well.   To learn more about what you can do with MyChart, go to [email or web address].    Your next appointment:   4 month(s)  Provider:   You may see Lorine Bears, MD or one of the following Advanced Practice Providers on your designated Care Team:   Carlos Levering, NP

## 2023-11-21 DIAGNOSIS — E119 Type 2 diabetes mellitus without complications: Secondary | ICD-10-CM | POA: Diagnosis not present

## 2023-11-21 DIAGNOSIS — H353131 Nonexudative age-related macular degeneration, bilateral, early dry stage: Secondary | ICD-10-CM | POA: Diagnosis not present

## 2023-11-21 DIAGNOSIS — H26493 Other secondary cataract, bilateral: Secondary | ICD-10-CM | POA: Diagnosis not present

## 2023-11-21 LAB — HM DIABETES EYE EXAM

## 2023-12-01 ENCOUNTER — Telehealth: Payer: Self-pay

## 2023-12-01 NOTE — Telephone Encounter (Signed)
PAP: Patient assistance application for Trulicity has been approved by PAP Companies: [employer] from 10/19/2023 to 10/17/2024. Medication should be delivered to PAP Delivery: Home. For further shipping updates, please contact [employer] at [phone]. Patient ID is: NO ID    PLEASE BE ADVISED SHIPPING PROCESSED ON 12/01/2023 FROM [facility] [phone] QUESTION FOR SHIPMENT

## 2023-12-01 NOTE — Progress Notes (Unsigned)
Pharmacy Medication Assistance Program Note    12/01/2023  Patient ID: [name], male   DOB: 03/07/1946, 78 y.o.   MRN: [id number]     12/01/2023  Outreach [employer]  Initial Outreach Date (Medication One) 10/05/2023  Manufacturer [employer]ication [employer] Retail buyer Drugs Trulicity  Dose of Trulicity 0.75MG   Type of Radiographer, therapeutic Assistance  Name of Prescriber Larae Grooms  Patient Assistance Determination Approved  Approval Start Date 10/19/2023  Approval End Date 10/17/2024  Patient Notification Method MyChart     Signature

## 2023-12-15 ENCOUNTER — Telehealth: Payer: Self-pay

## 2023-12-15 NOTE — Telephone Encounter (Signed)
 PAP: Patient assistance application for [name] has been approved by PAP Companies: [employer] from 09/28/2023 to 10/17/2024. Medication should be delivered to PAP Delivery: Home. For further shipping updates, please contact [employer] ([employer]) at [phone]. Patient ID is: NO ID  PLEASE BE ADVISE PT IS APPROVED BUT NEED A SCRIPT SENT TO COMPANY I HAVE ADDED MEDVANTX TO PREFERRED  PHARMACIES

## 2023-12-15 NOTE — Progress Notes (Unsigned)
   12/15/2023  Patient ID: [name], male   DOB: [birth year], 78 y.o.   MRN: [id number]  In basket message from medication assistance team stating patient approved to continue to receive Farxiga 10mg  through [employer] PAP; but the program needs an updated prescription.  Pending 90 day refill with year worth of refills for PCP to sign if in agreement.  Lenna Gilford, PharmD, DPLA

## 2023-12-16 MED ORDER — FARXIGA 10 MG PO TABS: 10.0000 mg | ORAL_TABLET | Freq: Every day | ORAL | 4 refills | Status: DC

## 2023-12-21 ENCOUNTER — Other Ambulatory Visit: Payer: Self-pay | Admitting: Cardiovascular Disease

## 2024-01-10 ENCOUNTER — Other Ambulatory Visit: Payer: Self-pay | Admitting: Nurse Practitioner

## 2024-01-10 DIAGNOSIS — E1159 Type 2 diabetes mellitus with other circulatory complications: Secondary | ICD-10-CM

## 2024-01-12 NOTE — Telephone Encounter (Signed)
 Requested Prescriptions  Pending Prescriptions Disp Refills   telmisartan (MICARDIS) 40 MG tablet [Pharmacy Med Name: Telmisartan 40 MG Oral Tablet] 100 tablet 0    Sig: TAKE 1 TABLET BY MOUTH DAILY     Cardiovascular:  Angiotensin Receptor Blockers Failed - 01/12/2024 12:16 PM      Failed - Valid encounter within last 6 months    Recent Outpatient Visits   None     Future Appointments             In 3 weeks Larae Grooms, NP [facility], [facility]   In 1 month Iran Ouch, MD [facility] - Cr in normal range and within 180 days    Creat  Date Value Ref Range Status  11/29/2017 1.03 0.70 - 1.18 mg/dL Final    Comment:    For patients >25 years of age, the reference limit for Creatinine is approximately 13% higher for people identified as African-American. .    Creatinine, Ser  Date Value Ref Range Status  08/10/2023 1.00 0.76 - 1.27 mg/dL Final   Creatinine, Urine  Date Value Ref Range Status  04/21/2017 206 20 - 370 mg/dL Final         Passed - K in normal range and within 180 days    Potassium  Date Value Ref Range Status  08/10/2023 4.3 3.5 - 5.2 mmol/L Final         Passed - Patient is not pregnant      Passed - Last BP in normal range    BP Readings from Last 1 Encounters:  11/07/23 118/80

## 2024-01-30 ENCOUNTER — Other Ambulatory Visit: Payer: Self-pay

## 2024-01-31 MED ORDER — TRULICITY 0.75 MG/0.5ML ~~LOC~~ SOAJ: 0.7500 mg | SUBCUTANEOUS | 1 refills | Status: AC

## 2024-02-08 ENCOUNTER — Ambulatory Visit (INDEPENDENT_AMBULATORY_CARE_PROVIDER_SITE_OTHER): Payer: Self-pay | Admitting: Nurse Practitioner

## 2024-02-08 ENCOUNTER — Encounter: Payer: Self-pay | Admitting: Nurse Practitioner

## 2024-02-08 VITALS — BP 105/70 | HR 59 | Temp 97.9°F | Wt 205.0 lb

## 2024-02-08 DIAGNOSIS — J439 Emphysema, unspecified: Secondary | ICD-10-CM

## 2024-02-08 DIAGNOSIS — I700 Atherosclerosis of aorta: Secondary | ICD-10-CM | POA: Diagnosis not present

## 2024-02-08 DIAGNOSIS — D519 Vitamin B12 deficiency anemia, unspecified: Secondary | ICD-10-CM

## 2024-02-08 DIAGNOSIS — E1159 Type 2 diabetes mellitus with other circulatory complications: Secondary | ICD-10-CM | POA: Diagnosis not present

## 2024-02-08 DIAGNOSIS — J4489 Other specified chronic obstructive pulmonary disease: Secondary | ICD-10-CM

## 2024-02-08 DIAGNOSIS — Z7985 Long-term (current) use of injectable non-insulin antidiabetic drugs: Secondary | ICD-10-CM | POA: Diagnosis not present

## 2024-02-08 DIAGNOSIS — E785 Hyperlipidemia, unspecified: Secondary | ICD-10-CM

## 2024-02-08 DIAGNOSIS — I25110 Atherosclerotic heart disease of native coronary artery with unstable angina pectoris: Secondary | ICD-10-CM

## 2024-02-08 DIAGNOSIS — E119 Type 2 diabetes mellitus without complications: Secondary | ICD-10-CM | POA: Diagnosis not present

## 2024-02-08 DIAGNOSIS — I152 Hypertension secondary to endocrine disorders: Secondary | ICD-10-CM | POA: Diagnosis not present

## 2024-02-08 DIAGNOSIS — E1169 Type 2 diabetes mellitus with other specified complication: Secondary | ICD-10-CM | POA: Diagnosis not present

## 2024-02-08 DIAGNOSIS — E1142 Type 2 diabetes mellitus with diabetic polyneuropathy: Secondary | ICD-10-CM | POA: Diagnosis not present

## 2024-02-08 LAB — MICROALBUMIN, URINE WAIVED
Creatinine, Urine Waived: 100 mg/dL (ref 10–300)
Microalb, Ur Waived: 30 mg/L — ABNORMAL HIGH (ref 0–19)

## 2024-02-08 NOTE — Assessment & Plan Note (Signed)
 Chronic.  Continue with Statin therapy and ASA.

## 2024-02-08 NOTE — Assessment & Plan Note (Signed)
Chronic.  Controlled.  Continue with current medication regimen of telmisartan and Clonidine. Labs ordered today. Will make recommendations based on lab results.

## 2024-02-08 NOTE — Assessment & Plan Note (Signed)
 Chronic.  Controlled.  Continue with current medication regimen of Gabapentin .  Continue with Farxiga  and Trulicity .  Labs ordered today.  A1c has been well controlled at 5.9%.  Eye exam up to date.  Return to clinic in 3 months for reevaluation.  Call sooner if concerns arise.

## 2024-02-08 NOTE — Progress Notes (Signed)
 BP 105/70   Pulse (!) 59   Temp 97.9 F (36.6 C) (Oral)   Wt 205 lb (93 kg)   SpO2 98%   BMI 29.41 kg/m    Subjective:    Patient ID: Juan Kitty., male    DOB: [birth year], 78 y.o.   MRN: [id number]  HPI: Juan Le. is a 78 y.o. male  Chief Complaint  Patient presents with   Hypertension   Diabetes    HYPERTENSION / HYPERLIPIDEMIA Doing well.  Denies concerns at visit today.  Satisfied with current treatment? yes Duration of hypertension: years BP monitoring frequency:no BP range:  BP medication side effects: no Past BP meds: telmisartan  and clonidine  Duration of hyperlipidemia: years Cholesterol medication side effects: no Cholesterol supplements: none Past cholesterol medications: atorvastain (lipitor) Medication compliance: excellent compliance Aspirin : yes Recent stressors: no Recurrent headaches: no Visual changes: no Palpitations: no Dyspnea: with excretion Chest pain: no Lower extremity edema: no Dizzy/lightheaded: no   DIABETES Now taking Farxiga  and Trulicity .  Denies concerns regarding medication at visit today.  A1c has been well controlled. Hypoglycemic episodes:no Polydipsia/polyuria: no Visual disturbance: no Chest pain: no Paresthesias: yes Glucose Monitoring: no             Accucheck frequency: hasn't been checking it             Fasting glucose:             Post prandial:             Evening:             Before meals: Taking Insulin?: no             Long acting insulin:             Short acting insulin: Blood Pressure Monitoring: sometimes Retinal Examination: Up to Date Foot Exam: Up to Date Diabetic Education: Not Completed Pneumovax: Up to Date Influenza: Up to Date Aspirin : yes   COPD COPD status: controlled Satisfied with current treatment?: yes Oxygen use: no Dyspnea frequency: nonel Cough frequency:  Rescue inhaler frequency:  none Limitation of activity: yes Productive cough:  Last Spirometry:  03/04/2021 Pneumovax: Up to Date Influenza: Up to Date   SLEEP APNEA Doesn't even have a CPAP machine.  Not interested in getting another machine or doing another sleep study.  Does not want to get CPAP. Sleep apnea status: uncontrolled Duration: chronic Satisfied with current treatment?:  no CPAP use:  no- didn't like it Sleep quality with CPAP use: good Treament compliance:poor compliance Last sleep study:  Treatments attempted: CPAP Wakes feeling refreshed:  yes Daytime hypersomnolence:  no Fatigue:  no Insomnia:  no Good sleep hygiene:  no Difficulty falling asleep:  no Difficulty staying asleep:  no Snoring bothers bed partner:  unknown Observed apnea by bed partner: no Obesity:  yes Hypertension: yes  Pulmonary hypertension:  no Coronary artery disease:  yes   NEUROPATHY Patient states his neuropathy is about the same.  States the pain is a burning in his feet.  Feels like the gabapentin  helps with his pain.  He feels like this dose is good. He still feels numbness but the pain does improve with gabapentin .  Relevant past medical, surgical, family and social history reviewed and updated as indicated. Interim medical history since our last visit reviewed. Allergies and medications reviewed and updated.  Review of Systems  Eyes:  Negative for visual disturbance.  Respiratory:  Negative for chest tightness and  shortness of breath.   Cardiovascular:  Negative for chest pain, palpitations and leg swelling.  Endocrine: Negative for polydipsia and polyuria.  Neurological:  Positive for numbness. Negative for dizziness, light-headedness and headaches.    Per HPI unless specifically indicated above     Objective:    BP 105/70   Pulse (!) 59   Temp 97.9 F (36.6 C) (Oral)   Wt 205 lb (93 kg)   SpO2 98%   BMI 29.41 kg/m   Wt Readings from Last 3 Encounters:  02/08/24 205 lb (93 kg)  11/07/23 205 lb 2 oz (93 kg)  09/02/23 210 lb 3.2 oz (95.3 kg)    Physical  Exam Vitals and nursing note reviewed.  Constitutional:      General: He is not in acute distress.    Appearance: Normal appearance. He is not ill-appearing, toxic-appearing or diaphoretic.  HENT:     Head: Normocephalic.     Right Ear: External ear normal.     Left Ear: External ear normal.     Nose: Nose normal. No congestion or rhinorrhea.     Mouth/Throat:     Mouth: Mucous membranes are moist.  Eyes:     General:        Right eye: No discharge.        Left eye: No discharge.     Extraocular Movements: Extraocular movements intact.     Conjunctiva/sclera: Conjunctivae normal.     Pupils: Pupils are equal, round, and reactive to light.  Cardiovascular:     Rate and Rhythm: Normal rate and regular rhythm.     Heart sounds: No murmur heard. Pulmonary:     Effort: Pulmonary effort is normal. No respiratory distress.     Breath sounds: Normal breath sounds. No wheezing, rhonchi or rales.  Abdominal:     General: Abdomen is flat. Bowel sounds are normal.  Musculoskeletal:     Cervical back: Normal range of motion and neck supple.  Skin:    General: Skin is warm and dry.     Capillary Refill: Capillary refill takes less than 2 seconds.  Neurological:     General: No focal deficit present.     Mental Status: He is alert and oriented to person, place, and time.  Psychiatric:        Mood and Affect: Mood normal.        Behavior: Behavior normal.        Thought Content: Thought content normal.        Judgment: Judgment normal.     Results for orders placed or performed in visit on 11/21/23  HM DIABETES EYE EXAM   Collection Time: 11/21/23 12:31 PM  Result Value Ref Range   HM Diabetic Eye Exam No Retinopathy No Retinopathy      Assessment & Plan:   Problem List Items Addressed This Visit       Cardiovascular and Mediastinum   Hypertension associated with diabetes (HCC)   Chronic.  Controlled.  Continue with current medication regimen of telmisartan  and Clonidine .  Labs ordered today. Will make recommendations based on lab results.       Relevant Orders   Comprehensive metabolic panel with GFR   Coronary artery disease involving native coronary artery of native heart with unstable angina pectoris (HCC)   Chronic.  Continue with Statin therapy and ASA.      Relevant Orders   CBC w/Diff   Aortic atherosclerosis (HCC)   Chronic. Continue with Statin therapy and  ASA.        Respiratory   COPD with chronic bronchitis and emphysema (HCC)   Chronic.  Controlled.  Continue with current medication regimen.  Denies concerns about breathing today. Return to clinic in 3 months for reevaluation.  Call sooner if concerns arise.         Endocrine   Type 2 diabetes mellitus with peripheral neuropathy (HCC) - Primary   Chronic.  Controlled.  Continue with current medication regimen of Gabapentin .  Continue with Farxiga  and Trulicity .  Labs ordered today.  A1c has been well controlled at 5.9%.  Eye exam up to date.  Return to clinic in 3 months for reevaluation.  Call sooner if concerns arise.       Relevant Orders   Hemoglobin A1c   Microalbumin, Urine Waived   Hyperlipidemia associated with type 2 diabetes mellitus (HCC)   Chronic. Controlled.  Continue with current medication regimen of Atorvastatin  40mg  daily.  Return in 3 months.  Call sooner if concerns arise.      Relevant Orders   Lipid panel   Diabetes mellitus treated with injections of non-insulin medication (HCC)     Other   Vitamin B12 deficiency anemia   Labs ordered at visit today.  Will make recommendations based on lab results.       Relevant Orders   B12     Follow up plan: No follow-ups on file.

## 2024-02-08 NOTE — Assessment & Plan Note (Signed)
Chronic. Controlled.  Continue with current medication regimen of Atorvastatin '40mg'$  daily.  Return in 3 months.  Call sooner if concerns arise.

## 2024-02-08 NOTE — Assessment & Plan Note (Signed)
 Labs ordered at visit today.  Will make recommendations based on lab results.

## 2024-02-08 NOTE — Assessment & Plan Note (Signed)
Chronic.  Controlled.  Continue with current medication regimen.  Denies concerns about breathing today. Return to clinic in 3 months for reevaluation.  Call sooner if concerns arise.   

## 2024-02-09 ENCOUNTER — Encounter: Payer: Self-pay | Admitting: Nurse Practitioner

## 2024-02-09 LAB — CBC WITH DIFFERENTIAL/PLATELET
Basophils Absolute: 0 10*3/uL (ref 0.0–0.2)
Basos: 1 %
EOS (ABSOLUTE): 0.1 10*3/uL (ref 0.0–0.4)
Eos: 2 %
Hematocrit: 43.9 % (ref 37.5–51.0)
Hemoglobin: 14 g/dL (ref 13.0–17.7)
Immature Grans (Abs): 0 10*3/uL (ref 0.0–0.1)
Immature Granulocytes: 0 %
Lymphocytes Absolute: 2.5 10*3/uL (ref 0.7–3.1)
Lymphs: 33 %
MCH: 29.7 pg (ref 26.6–33.0)
MCHC: 31.9 g/dL (ref 31.5–35.7)
MCV: 93 fL (ref 79–97)
Monocytes Absolute: 0.6 10*3/uL (ref 0.1–0.9)
Monocytes: 8 %
Neutrophils Absolute: 4.3 10*3/uL (ref 1.4–7.0)
Neutrophils: 56 %
Platelets: 226 10*3/uL (ref 150–450)
RBC: 4.71 x10E6/uL (ref 4.14–5.80)
RDW: 14.5 % (ref 11.6–15.4)
WBC: 7.6 10*3/uL (ref 3.4–10.8)

## 2024-02-09 LAB — COMPREHENSIVE METABOLIC PANEL WITH GFR
ALT: 22 IU/L (ref 0–44)
AST: 20 IU/L (ref 0–40)
Albumin: 4.3 g/dL (ref 3.8–4.8)
Alkaline Phosphatase: 64 IU/L (ref 44–121)
BUN/Creatinine Ratio: 24 (ref 10–24)
BUN: 24 mg/dL (ref 8–27)
Bilirubin Total: 0.4 mg/dL (ref 0.0–1.2)
CO2: 19 mmol/L — ABNORMAL LOW (ref 20–29)
Calcium: 9.4 mg/dL (ref 8.6–10.2)
Chloride: 98 mmol/L (ref 96–106)
Creatinine, Ser: 0.99 mg/dL (ref 0.76–1.27)
Globulin, Total: 2.6 g/dL (ref 1.5–4.5)
Glucose: 97 mg/dL (ref 70–99)
Potassium: 4.6 mmol/L (ref 3.5–5.2)
Sodium: 136 mmol/L (ref 134–144)
Total Protein: 6.9 g/dL (ref 6.0–8.5)
eGFR: 78 mL/min/{1.73_m2} (ref >59)

## 2024-02-09 LAB — LIPID PANEL
Chol/HDL Ratio: 4.7 ratio (ref 0.0–5.0)
Cholesterol, Total: 146 mg/dL (ref 100–199)
HDL: 31 mg/dL — ABNORMAL LOW (ref >39)
LDL Chol Calc (NIH): 73 mg/dL (ref 0–99)
Triglycerides: 254 mg/dL — ABNORMAL HIGH (ref 0–149)
VLDL Cholesterol Cal: 42 mg/dL — ABNORMAL HIGH (ref 5–40)

## 2024-02-09 LAB — HEMOGLOBIN A1C
Est. average glucose Bld gHb Est-mCnc: 117 mg/dL
Hgb A1c MFr Bld: 5.7 % — ABNORMAL HIGH (ref 4.8–5.6)

## 2024-02-09 LAB — VITAMIN B12: Vitamin B-12: 194 pg/mL — ABNORMAL LOW (ref 232–1245)

## 2024-02-16 DIAGNOSIS — H353131 Nonexudative age-related macular degeneration, bilateral, early dry stage: Secondary | ICD-10-CM | POA: Diagnosis not present

## 2024-02-16 DIAGNOSIS — E119 Type 2 diabetes mellitus without complications: Secondary | ICD-10-CM | POA: Diagnosis not present

## 2024-02-16 DIAGNOSIS — H26493 Other secondary cataract, bilateral: Secondary | ICD-10-CM | POA: Diagnosis not present

## 2024-02-16 LAB — HM DIABETES EYE EXAM

## 2024-02-27 ENCOUNTER — Encounter (HOSPITAL_COMMUNITY): Payer: Self-pay

## 2024-03-05 ENCOUNTER — Encounter: Payer: Self-pay | Admitting: Nurse Practitioner

## 2024-03-05 ENCOUNTER — Ambulatory Visit (INDEPENDENT_AMBULATORY_CARE_PROVIDER_SITE_OTHER): Admitting: Nurse Practitioner

## 2024-03-05 VITALS — BP 150/82 | HR 57 | Temp 98.6°F | Wt 203.0 lb

## 2024-03-05 DIAGNOSIS — J4489 Other specified chronic obstructive pulmonary disease: Secondary | ICD-10-CM

## 2024-03-05 DIAGNOSIS — J439 Emphysema, unspecified: Secondary | ICD-10-CM

## 2024-03-05 MED ORDER — BREZTRI AEROSPHERE 160-9-4.8 MCG/ACT IN AERO: 2.0000 | INHALATION_SPRAY | Freq: Two times a day (BID) | RESPIRATORY_TRACT | Status: DC

## 2024-03-05 NOTE — Assessment & Plan Note (Signed)
 Chronic.  Worsening cough. Will start Breztri - sample given at visit today.  If he is doing well, can send in prescription.  He will likely need to apply for PAP.  Will also place referral for patient to see Pulmonology.

## 2024-03-05 NOTE — Progress Notes (Signed)
 BP (!) 150/82   Pulse (!) 57   Temp 98.6 F (37 C) (Oral)   Wt 203 lb (92.1 kg)   SpO2 98%   BMI 29.13 kg/m    Subjective:    Patient ID: Juan Kitty., male    DOB: 18-Jun-1946, 78 y.o.   MRN: [id number]  HPI: Juan Trulson. is a 78 y.o. male  Chief Complaint  Patient presents with   OTHER    Pt is wanting referral for lung doctor   COPD COPD status: controlled Satisfied with current treatment?: yes Oxygen use: no Dyspnea frequency: no Cough frequency: yes Rescue inhaler frequency:  none Limitation of activity: no Productive cough: no Last Spirometry: none Pneumovax: Up to date Influenza: Up to Date Not currently using any maintenance inhalers.  Complains of worsening cough.  Would like to have his lungs looked at further.  Relevant past medical, surgical, family and social history reviewed and updated as indicated. Interim medical history since our last visit reviewed. Allergies and medications reviewed and updated.  Review of Systems  Respiratory:  Positive for cough. Negative for shortness of breath.     Per HPI unless specifically indicated above     Objective:     BP (!) 150/82   Pulse (!) 57   Temp 98.6 F (37 C) (Oral)   Wt 203 lb (92.1 kg)   SpO2 98%   BMI 29.13 kg/m   Wt Readings from Last 3 Encounters:  03/05/24 203 lb (92.1 kg)  02/08/24 205 lb (93 kg)  11/07/23 205 lb 2 oz (93 kg)    Physical Exam Vitals and nursing note reviewed.  Constitutional:      General: He is not in acute distress.    Appearance: Normal appearance. He is not ill-appearing, toxic-appearing or diaphoretic.  HENT:     Head: Normocephalic.     Right Ear: External ear normal.     Left Ear: External ear normal.     Nose: Nose normal. No congestion or rhinorrhea.     Mouth/Throat:     Mouth: Mucous membranes are moist.  Eyes:     General:        Right eye: No discharge.        Left eye: No discharge.     Extraocular Movements: Extraocular  movements intact.     Conjunctiva/sclera: Conjunctivae normal.     Pupils: Pupils are equal, round, and reactive to light.  Cardiovascular:     Rate and Rhythm: Normal rate and regular rhythm.     Heart sounds: No murmur heard. Pulmonary:     Effort: Pulmonary effort is normal. No respiratory distress.     Breath sounds: Normal breath sounds. No wheezing, rhonchi or rales.  Abdominal:     General: Abdomen is flat. Bowel sounds are normal.  Musculoskeletal:     Cervical back: Normal range of motion and neck supple.  Skin:    General: Skin is warm and dry.     Capillary Refill: Capillary refill takes less than 2 seconds.  Neurological:     General: No focal deficit present.     Mental Status: He is alert and oriented to person, place, and time.  Psychiatric:        Mood and Affect: Mood normal.        Behavior: Behavior normal.        Thought Content: Thought content normal.        Judgment: Judgment normal.  Results for orders placed or performed in visit on 02/21/24  HM DIABETES EYE EXAM   Collection Time: 02/16/24  2:59 PM  Result Value Ref Range   HM Diabetic Eye Exam No Retinopathy No Retinopathy      Assessment & Plan:   Problem List Items Addressed This Visit       Respiratory   COPD with chronic bronchitis and emphysema (HCC) - Primary   Chronic.  Worsening cough. Will start Breztri - sample given at visit today.  If he is doing well, can send in prescription.  He will likely need to apply for PAP.  Will also place referral for patient to see Pulmonology.        Relevant Medications   budesonide-glycopyrrolate-formoterol (BREZTRI  AEROSPHERE) 160-9-4.8 MCG/ACT AERO inhaler   Other Relevant Orders   Ambulatory referral to Pulmonology     Follow up plan: No follow-ups on file.

## 2024-03-06 ENCOUNTER — Ambulatory Visit: Payer: Medicare Other | Admitting: Cardiovascular Disease

## 2024-03-14 ENCOUNTER — Encounter: Payer: Self-pay | Admitting: Nurse Practitioner

## 2024-03-14 ENCOUNTER — Ambulatory Visit: Attending: Nurse Practitioner | Admitting: Nurse Practitioner

## 2024-03-14 VITALS — BP 130/68 | HR 59 | Ht 70.0 in | Wt 207.2 lb

## 2024-03-14 DIAGNOSIS — J439 Emphysema, unspecified: Secondary | ICD-10-CM | POA: Diagnosis not present

## 2024-03-14 DIAGNOSIS — I10 Essential (primary) hypertension: Secondary | ICD-10-CM

## 2024-03-14 DIAGNOSIS — I2510 Atherosclerotic heart disease of native coronary artery without angina pectoris: Secondary | ICD-10-CM

## 2024-03-14 DIAGNOSIS — E785 Hyperlipidemia, unspecified: Secondary | ICD-10-CM | POA: Diagnosis not present

## 2024-03-14 DIAGNOSIS — J4489 Other specified chronic obstructive pulmonary disease: Secondary | ICD-10-CM

## 2024-03-14 DIAGNOSIS — E1142 Type 2 diabetes mellitus with diabetic polyneuropathy: Secondary | ICD-10-CM

## 2024-03-14 NOTE — Patient Instructions (Signed)
 Medication Instructions:  Your physician recommends that you continue on your current medications as directed. Please refer to the Current Medication list given to you today.  *If you need a refill on your cardiac medications before your next appointment, please call your pharmacy*  Lab Work: None ordered   If you have labs (blood work) drawn today and your tests are completely normal, you will receive your results only by: MyChart Message (if you have MyChart) OR A paper copy in the mail If you have any lab test that is abnormal or we need to change your treatment, we will call you to review the results.  Testing/Procedures: None Ordered    Follow-Up: At [facility], you and your health needs are our priority.  As part of our continuing mission to provide you with exceptional heart care, our providers are all part of one team.  This team includes your primary Cardiologist (physician) and Advanced Practice Providers or APPs (Physician Assistants and Nurse Practitioners) who all work together to provide you with the care you need, when you need it.  Your next appointment:   6 month(s)  Provider:   You may see Antionette Kirks, MD or one of the following Advanced Practice Providers on your designated Care Team:   Laneta Pintos, NP Gildardo Labrador, PA-C Varney Gentleman, PA-C Cadence Hatboro, PA-C Ronald Cockayne, NP Morey Ar, NP    We recommend signing up for the patient portal called "MyChart".  Sign up information is provided on this After Visit Summary.  MyChart is used to connect with patients for Virtual Visits (Telemedicine).  Patients are able to view lab/test results, encounter notes, upcoming appointments, etc.  Non-urgent messages can be sent to your provider as well.   To learn more about what you can do with MyChart, go to [email or web address].

## 2024-03-14 NOTE — Progress Notes (Addendum)
 Office Visit    Patient Name: Juan Le. Date of Encounter: 03/14/2024  Primary Care Provider:  Melvin Pao, NP Primary Cardiologist:  Deatrice Cage, MD  Chief Complaint    78 y.o. male with a history of CAD status post CABG x 4 and subsequent PCI, hypertension, hyperlipidemia, diabetes, COPD, sleep apnea, GERD, diastolic dysfunction, and sleep apnea, who presents for CAD follow-up.  Past Medical History   Subjective   Past Medical History:  Diagnosis Date   Anxiety    Atelectasis of left lung 03/25/2017   Chronic, continuous use of opioids    COPD (chronic obstructive pulmonary disease) (HCC)    Coronary artery disease    a. 1997 s/p CABG x 4 (LIMA->LAD, VG->OM2, VG->D2, VG->RPDA); b. 2008 Cath/PCI: VG->D1 90ost (s/p DES). Other grafts patent; c. 12/2022 MV: antlat/inflat/inf ischemia/infarct; d. 12/2022 Cath: LM diff dzs, LAD 100ost, LCX ok, OM2 100, OM branches fill via R->L collats from RV marginal branch,RCA 60p, 100d, RPL3 fills via collats from OM3, VG->OM2 100, VG->D2 100, VG->RPDA 100, LIMA->LAD ok-->Med Rx.   Degenerative disc disease, lumbar    Diabetes (HCC)    Diastolic dysfunction    a. 01/2023 Echo: EF 55-60%, no rwma, GrI DD, nl RV fxn, mild MR, Ao sclerosis.   DJD (degenerative joint disease)    GERD (gastroesophageal reflux disease)    Heart attack (HCC)    History of diverticulitis    Hypertension    Hypertriglyceridemia    Hypokalemia    Insomnia    Mitral regurgitation    Sleep apnea    Vitamin D  deficiency    Wears dentures    full upper   Past Surgical History:  Procedure Laterality Date   COLONOSCOPY WITH PROPOFOL  N/A 01/28/2017   Procedure: COLONOSCOPY WITH PROPOFOL ;  Surgeon: Rogelia Copping, MD;  Location: [facility];  Service: Endoscopy;  Laterality: N/A;  diabetic - oral meds   COLONOSCOPY WITH PROPOFOL  N/A 06/29/2022   Procedure: COLONOSCOPY WITH PROPOFOL ;  Surgeon: Copping Rogelia, MD;  Location: [facility];   Service: Endoscopy;  Laterality: N/A;   CORONARY ARTERY BYPASS GRAFT  1997   CORONARY STENT PLACEMENT  11/2006   FOOT SURGERY  2003   HERNIA REPAIR     LEFT HEART CATH AND CORONARY ANGIOGRAPHY Left 12/31/2022   Procedure: LEFT HEART CATH AND CORONARY ANGIOGRAPHY;  Surgeon: Cage Deatrice DELENA, MD;  Location: [facility];  Service: Cardiovascular;  Laterality: Left;   POLYPECTOMY N/A 01/28/2017   Procedure: POLYPECTOMY;  Surgeon: Rogelia Copping, MD;  Location: [facility];  Service: Endoscopy;  Laterality: N/A;   UMBILICAL HERNIA REPAIR  04/2012    Allergies  Allergies  Allergen Reactions   Ace Inhibitors Cough   Diphenhydramine Rash    stomach upset       History of Present Illness      78 y.o. y/o male with a history of CAD status post CABG x 4 and subsequent PCI, hypertension, hyperlipidemia, diabetes, COPD, sleep apnea, GERD, diastolic dysfunction, and sleep apnea.  He previously underwent CABG x 4 in 1997 and then required PCI of the vein graft to the diagonal in 2008.  It is notable that other grafts were patent at that time.  In February 2024, he was evaluated secondary to chest pain and dyspnea.  Stress testing was performed and showed inferior and inferolateral infarct as well as anterolateral ischemia.  Diagnostic catheterization was performed in March 2024 which showed severe native multivessel disease with  only 1 of 4 patent grafts (only the LIMA to the LAD remained patent).  Medical therapy was recommended.  Echocardiogram in April 2024 showed an EF of 55 to 60% with grade 1 diastolic dysfunction, mild MR, and aortic sclerosis.   Juan Le was last seen in cardiology clinic in January 2025, at which time he was doing well.  Over the past 4 mos, he has continued to do well.  He admits to being sedentary.  In that setting, he does not experience chest pain or dyspnea.  Further, he denies palpitation, PND, orthopnea, dizziness, syncope, edema, or early  satiety. Objective   Home Medications    Current Outpatient Medications  Medication Sig Dispense Refill   aspirin  81 MG tablet Take 1 tablet by mouth daily.     atorvastatin  (LIPITOR) 40 MG tablet TAKE 1 TABLET BY MOUTH DAILY 100 tablet 2   budesonide-glycopyrrolate-formoterol (BREZTRI  AEROSPHERE) 160-9-4.8 MCG/ACT AERO inhaler Inhale 2 puffs into the lungs 2 (two) times daily.     carvedilol  (COREG ) 6.25 MG tablet Take 1 tablet (6.25 mg total) by mouth 2 (two) times daily. 180 tablet 2   Dulaglutide  (TRULICITY ) 0.75 MG/0.5ML SOAJ Inject 0.75 mg into the skin once a week. 6 mL 1   FARXIGA  10 MG TABS tablet Take 1 tablet (10 mg total) by mouth daily before breakfast. 90 tablet 4   metFORMIN  (GLUCOPHAGE ) 1000 MG tablet TAKE 1 TABLET BY MOUTH TWICE  DAILY WITH MEALS 200 tablet 1   omeprazole  (PRILOSEC) 40 MG capsule TAKE 1 CAPSULE BY MOUTH DAILY 100 capsule 2   QUEtiapine  (SEROQUEL ) 200 MG tablet TAKE 1 TABLET BY MOUTH AT  BEDTIME 90 tablet 3   telmisartan  (MICARDIS ) 40 MG tablet TAKE 1 TABLET BY MOUTH DAILY 100 tablet 0   Blood Glucose Monitoring Suppl (ONETOUCH VERIO FLEX SYSTEM) w/Device KIT USE UP TO 4 TIMES DAILY AS  DIRECTED (Patient not taking: Reported on 03/14/2024) 1 kit 0   Lancets (ONETOUCH DELICA PLUS LANCET33G) MISC USE AS DIRECTED TO CHECK  BLOOD GLUCOSE ONCE DAILY (Patient not taking: Reported on 03/14/2024) 100 each 3   ONETOUCH VERIO test strip USE AS INSTRUCTED  TO TEST  BLOOD SUGAR TWO TIMES DAILY (Patient not taking: Reported on 03/14/2024) 200 strip 3   No current facility-administered medications for this visit.     Physical Exam    VS:  BP 130/68 (BP Location: Left Arm, Patient Position: Sitting, Cuff Size: Normal)   Pulse (!) 59   Ht 5' 10 (1.778 m)   Wt 207 lb 4 oz (94 kg)   SpO2 97%   BMI 29.74 kg/m  , BMI Body mass index is 29.74 kg/m.       GEN: Well nourished, well developed, in no acute distress. HEENT: normal. Neck: Supple, no JVD, carotid bruits, or  masses. Cardiac: RRR, 2 out of 6 systolic murmur at the right upper sternal border.  No rubs or gallops. No clubbing, cyanosis, edema.  Radials 2+/PT 2+ and equal bilaterally.  Respiratory:  Respirations regular and unlabored, clear to auscultation bilaterally. GI: Soft, nontender, nondistended, BS + x 4. MS: no deformity or atrophy. Skin: warm and dry, no rash. Neuro:  Strength and sensation are intact. Psych: Normal affect.  Accessory Clinical Findings    ECG personally reviewed by me today - EKG Interpretation Date/Time:  Wednesday Mar 14 2024 09:00:47 EDT Ventricular Rate:  59 PR Interval:  210 QRS Duration:  82 QT Interval:  412 QTC Calculation: 407 R Axis:  22  Text Interpretation: Sinus bradycardia with 1st degree A-V block with occasional Premature ventricular complexes Possible Anterior infarct (cited on or before 10-Dec-2006) Confirmed by Vivienne Bruckner ([id number]) on 03/14/2024 9:12:28 AM  - no acute changes.  Lab Results  Component Value Date   WBC 7.6 02/08/2024   HGB 14.0 02/08/2024   HCT 43.9 02/08/2024   MCV 93 02/08/2024   PLT 226 02/08/2024   Lab Results  Component Value Date   CREATININE 0.99 02/08/2024   BUN 24 02/08/2024   NA 136 02/08/2024   K 4.6 02/08/2024   CL 98 02/08/2024   CO2 19 (L) 02/08/2024   Lab Results  Component Value Date   ALT 22 02/08/2024   AST 20 02/08/2024   ALKPHOS 64 02/08/2024   BILITOT 0.4 02/08/2024   Lab Results  Component Value Date   CHOL 146 02/08/2024   HDL 31 (L) 02/08/2024   LDLCALC 73 02/08/2024   TRIG 254 (H) 02/08/2024   CHOLHDL 4.7 02/08/2024    Lab Results  Component Value Date   HGBA1C 5.7 (H) 02/08/2024   Lab Results  Component Value Date   TSH 8.240 (H) 05/27/2022       Assessment & Plan    1.  Coronary artery disease: Status post CABG x 4 in 1997 with PCI of the vein graft to the diagonal in 2008.  Most recent catheterization in early 2024 showed 1 of 4 patent grafts (LIMA to the LAD  patent), with severe multivessel native coronary artery disease.  He has been medically managed since.  Fortunately, he has continued to do well without chest pain or dyspnea.  He is very sedentary.  We discussed the importance of achieving at least 150 minutes of moderate exercise weekly and discussed strategies for how to fit this into his day.  He says he will try.  He remains on aspirin , statin, beta-blocker, and ARB.  2.  Primary hypertension: Stable on beta-blocker and ARB therapy.  3.  Hyperlipidemia: Recent lab work with an LDL of 73.  We discussed this today.  He is currently on atorvastatin  40 mg daily.  We mutually agreed that instead of titrating atorvastatin  or adding Zetia to achieve a goal of less than 70, that he would begin exercising and also begin to substitute meat in his diet for legumes/vegetables.  4.  Type 2 diabetes mellitus: A1c 5.7 in April.  He remains on Farxiga , Trulicity , metformin , ARB, and statin.  5.  COPD: Recently placed on Breztri  in the setting of worsening cough.  Patient denies cough today.  No active wheezing.  6.  Disposition: Follow-up in 6 months or sooner if necessary.  Bruckner Vivienne, NP 03/14/2024, 9:12 AM

## 2024-03-20 ENCOUNTER — Other Ambulatory Visit: Payer: Self-pay | Admitting: Nurse Practitioner

## 2024-03-20 DIAGNOSIS — E1159 Type 2 diabetes mellitus with other circulatory complications: Secondary | ICD-10-CM

## 2024-03-22 NOTE — Telephone Encounter (Signed)
 Requested Prescriptions  Pending Prescriptions Disp Refills   telmisartan  (MICARDIS ) 40 MG tablet [Pharmacy Med Name: Telmisartan  40 MG Oral Tablet] 100 tablet 0    Sig: TAKE 1 TABLET BY MOUTH DAILY     Cardiovascular:  Angiotensin Receptor Blockers Passed - 03/22/2024  8:18 AM      Passed - Cr in normal range and within 180 days    Creat  Date Value Ref Range Status  11/29/2017 1.03 0.70 - 1.18 mg/dL Final    Comment:    For patients >78 years of age, the reference limit for Creatinine is approximately 13% higher for people identified as African-American. .    Creatinine, Ser  Date Value Ref Range Status  02/08/2024 0.99 0.76 - 1.27 mg/dL Final   Creatinine, Urine  Date Value Ref Range Status  04/21/2017 206 20 - 370 mg/dL Final         Passed - K in normal range and within 180 days    Potassium  Date Value Ref Range Status  02/08/2024 4.6 3.5 - 5.2 mmol/L Final         Passed - Patient is not pregnant      Passed - Last BP in normal range    BP Readings from Last 1 Encounters:  03/14/24 130/68         Passed - Valid encounter within last 6 months    Recent Outpatient Visits           2 weeks ago COPD with chronic bronchitis and emphysema (HCC)   [place] [facility] Aileen Alexanders, NP   1 month ago Type 2 diabetes mellitus with peripheral neuropathy [facility])   [place] [facility] Aileen Alexanders, NP

## 2024-03-28 ENCOUNTER — Encounter: Payer: Self-pay | Admitting: Internal Medicine

## 2024-03-28 ENCOUNTER — Ambulatory Visit: Admitting: Internal Medicine

## 2024-03-28 VITALS — BP 110/80 | HR 70 | Temp 98.0°F | Ht 70.0 in | Wt 205.2 lb

## 2024-03-28 DIAGNOSIS — J449 Chronic obstructive pulmonary disease, unspecified: Secondary | ICD-10-CM | POA: Diagnosis not present

## 2024-03-28 DIAGNOSIS — I2510 Atherosclerotic heart disease of native coronary artery without angina pectoris: Secondary | ICD-10-CM | POA: Diagnosis not present

## 2024-03-28 DIAGNOSIS — R0602 Shortness of breath: Secondary | ICD-10-CM | POA: Diagnosis not present

## 2024-03-28 DIAGNOSIS — R5381 Other malaise: Secondary | ICD-10-CM | POA: Diagnosis not present

## 2024-03-28 DIAGNOSIS — Z87891 Personal history of nicotine dependence: Secondary | ICD-10-CM

## 2024-03-28 MED ORDER — BREZTRI AEROSPHERE 160-9-4.8 MCG/ACT IN AERO: 2.0000 | INHALATION_SPRAY | Freq: Two times a day (BID) | RESPIRATORY_TRACT | Status: DC

## 2024-03-28 NOTE — Patient Instructions (Signed)
 Recommend obtaining chest x-ray to assess lungs Recommend obtaining pulmonary function testing to assess lung function Continue Breztri  as prescribed Please rinse mouth after use  Avoid Allergens and Irritants Avoid secondhand smoke Avoid SICK contacts Recommend  Masking  when appropriate Recommend Keep up-to-date with vaccinations  Please exercise as tolerated

## 2024-03-28 NOTE — Progress Notes (Signed)
 [facility] Consultation      Date: 03/28/2024,   MRN# [id number] Juan Le. 08-22-1946    CHIEF COMPLAINT:   Assessment of COPD  HISTORY OF PRESENT ILLNESS   78 year old white male seen today for ongoing shortness of breath Patient may have underlying diagnosis of COPD Patient is a non-smoker however has significant secondhand smoke exposure his whole life Findings are consistent with shortness of breath with exertion Intermittent productive cough but mostly dry Intermittent wheezing Patient was started on Breztri  inhaler by his PCP and seems to help his symptoms  Ambulating pulse oximetry in the office today did not reveal hypoxia  No exacerbation at this time No evidence of heart failure at this time No evidence or signs of infection at this time No respiratory distress No fevers, chills, nausea, vomiting, diarrhea No evidence of lower extremity edema No evidence hemoptysis  I have explained to him that patient will need further testing with chest x-ray and pulmonary function test for further assessment but at this time he does not require oxygen with exertion  Cardiology assessment Coronary artery disease: Status post CABG x 4 in 1997 with PCI of the vein graft to the diagonal in 2008.  Most recent catheterization in early 2024 showed 1 of 4 patent grafts (LIMA to the LAD patent), with severe multivessel native coronary artery disease.  He has been medically managed since.   He is very sedentary.  He remains on aspirin , statin, beta-blocker, and ARB.   PAST MEDICAL HISTORY   Past Medical History:  Diagnosis Date   Anxiety    Atelectasis of left lung 03/25/2017   Chronic, continuous use of opioids    COPD (chronic obstructive pulmonary disease) (HCC)    Coronary artery disease    a. 1997 s/p CABG x 4 (LIMA->LAD, VG->OM2, VG->D2, VG->RPDA); b. 2008 Cath/PCI: VG->D1 90ost (s/p DES). Other grafts patent; c. 12/2022 MV: antlat/inflat/inf  ischemia/infarct; d. 12/2022 Cath: LM diff dzs, LAD 100ost, LCX ok, OM2 100, OM branches fill via R->L collats from RV marginal branch,RCA 60p, 100d, RPL3 fills via collats from OM3, VG->OM2 100, VG->D2 100, VG->RPDA 100, LIMA->LAD ok-->Med Rx.   Degenerative disc disease, lumbar    Diabetes (HCC)    Diastolic dysfunction    a. 01/2023 Echo: EF 55-60%, no rwma, GrI DD, nl RV fxn, mild MR, Ao sclerosis.   DJD (degenerative joint disease)    GERD (gastroesophageal reflux disease)    Heart attack (HCC)    History of diverticulitis    Hypertension    Hypertriglyceridemia    Hypokalemia    Insomnia    Mitral regurgitation    Sleep apnea    Vitamin D  deficiency    Wears dentures    full upper     SURGICAL HISTORY   Past Surgical History:  Procedure Laterality Date   COLONOSCOPY WITH PROPOFOL  N/A 01/28/2017   Procedure: COLONOSCOPY WITH PROPOFOL ;  Surgeon: Marnee Sink, MD;  Location: [facility];  Service: Endoscopy;  Laterality: N/A;  diabetic - oral meds   COLONOSCOPY WITH PROPOFOL  N/A 06/29/2022   Procedure: COLONOSCOPY WITH PROPOFOL ;  Surgeon: Marnee Sink, MD;  Location: [facility];  Service: Endoscopy;  Laterality: N/A;   CORONARY ARTERY BYPASS GRAFT  1997   CORONARY STENT PLACEMENT  11/2006   FOOT SURGERY  2003   HERNIA REPAIR     LEFT HEART CATH AND CORONARY ANGIOGRAPHY Left 12/31/2022   Procedure: LEFT HEART CATH AND CORONARY ANGIOGRAPHY;  Surgeon: Antionette Kirks  A, MD;  Location: [facility]AS[facility] CV [facility];  Service: Cardiovascular;  Laterality: Left;   POLYPECTOMY N/A 01/28/2017   Procedure: POLYPECTOMY;  Surgeon: Marnee Sink, MD;  Location: [facility];  Service: Endoscopy;  Laterality: N/A;   UMBILICAL HERNIA REPAIR  04/2012     FAMILY HISTORY   Family History  Problem Relation Age of Onset   Throat cancer Father    Colon cancer Father    Heart disease Father    Cancer Father    Depression Father    Hypertension Father    Parkinson's disease Father     Arthritis Brother    Stroke Mother    Kidney disease Mother    Heart disease Mother    Kidney failure Mother    Breast cancer Paternal Aunt    Depression Sister    Stroke Maternal Grandmother    Bone cancer Brother    Stroke Brother    Prostate cancer Neg Hx    Kidney cancer Neg Hx    Bladder Cancer Neg Hx      SOCIAL HISTORY   Social History   Tobacco Use   Smoking status: Former    Current packs/day: 0.00    Types: Cigarettes    Quit date: 10/19/1955    Years since quitting: 68.4    Passive exposure: Past   Smokeless tobacco: Never   Tobacco comments:    former light smoker; quit in his teens  Vaping Use   Vaping status: Never Used  Substance Use Topics   Alcohol  use: No    Alcohol /week: 0.0 standard drinks of alcohol    Drug use: No     MEDICATIONS    Home Medication:  Current Outpatient Rx   Order #: [id number] Class: Historical Med   Order #: [id number] Class: Normal   Order #: [id number] Class: Normal   Order #: [id number] Class: Sample   Order #: [id number] Class: Normal   Order #: [id number] Class: Normal   Order #: [id number] Class: Normal   Order #: [id number] Class: Normal   Order #: [id number] Class: Normal   Order #: [id number] Class: Normal   Order #: [id number] Class: Normal   Order #: [id number] Class: Normal   Order #: [id number] Class: Normal    Current Medication:  Current Outpatient Medications:    aspirin  81 MG tablet, Take 1 tablet by mouth daily., Disp: , Rfl:    atorvastatin  (LIPITOR) 40 MG tablet, TAKE 1 TABLET BY MOUTH DAILY, Disp: 100 tablet, Rfl: 2   Blood Glucose Monitoring Suppl (ONETOUCH VERIO FLEX SYSTEM) w/Device KIT, USE UP TO 4 TIMES DAILY AS  DIRECTED (Patient not taking: Reported on 03/14/2024), Disp: 1 kit, Rfl: 0   budesonide-glycopyrrolate-formoterol (BREZTRI  AEROSPHERE) 160-9-4.8 MCG/ACT AERO inhaler, Inhale 2 puffs into the lungs 2 (two) times daily., Disp: , Rfl:    carvedilol  (COREG ) 6.25 MG tablet, Take 1 tablet (6.25 mg total) by  mouth 2 (two) times daily., Disp: 180 tablet, Rfl: 2   Dulaglutide  (TRULICITY ) 0.75 MG/0.5ML SOAJ, Inject 0.75 mg into the skin once a week., Disp: 6 mL, Rfl: 1   FARXIGA  10 MG TABS tablet, Take 1 tablet (10 mg total) by mouth daily before breakfast., Disp: 90 tablet, Rfl: 4   Lancets (ONETOUCH DELICA PLUS LANCET33G) MISC, USE AS DIRECTED TO CHECK  BLOOD GLUCOSE ONCE DAILY (Patient not taking: Reported on 03/14/2024), Disp: 100 each, Rfl: 3   metFORMIN  (GLUCOPHAGE ) 1000 MG tablet, TAKE 1 TABLET BY MOUTH TWICE  DAILY WITH MEALS, Disp: 200 tablet, Rfl: 1   omeprazole  (PRILOSEC) 40 MG capsule, TAKE  1 CAPSULE BY MOUTH DAILY, Disp: 100 capsule, Rfl: 2   ONETOUCH VERIO test strip, USE AS INSTRUCTED  TO TEST  BLOOD SUGAR TWO TIMES DAILY (Patient not taking: Reported on 03/14/2024), Disp: 200 strip, Rfl: 3   QUEtiapine  (SEROQUEL ) 200 MG tablet, TAKE 1 TABLET BY MOUTH AT  BEDTIME, Disp: 90 tablet, Rfl: 3   telmisartan  (MICARDIS ) 40 MG tablet, TAKE 1 TABLET BY MOUTH DAILY, Disp: 100 tablet, Rfl: 0    ALLERGIES   Ace inhibitors and Diphenhydramine  BP 110/80 (BP Location: Right Arm, Patient Position: Sitting, Cuff Size: Normal)   Pulse 70   Temp 98 F (36.7 C) (Oral)   Ht 5' 10 (1.778 m)   Wt 205 lb 3.2 oz (93.1 kg)   SpO2 97%   BMI 29.44 kg/m    Review of Systems: Gen:  Denies  fever, sweats, chills weight loss  HEENT: Denies blurred vision, double vision, ear pain, eye pain, hearing loss, nose bleeds, sore throat Cardiac:  No dizziness, chest pain or heaviness, chest tightness,edema, No JVD Resp:  +cough, -sputum production, +shortness of breath,-wheezing, -hemoptysis,  Other:  All other systems negative   Physical Examination:   General Appearance: No distress  EYES PERRLA, EOM intact.   NECK Supple, No JVD Pulmonary: normal breath sounds, No wheezing.  CardiovascularNormal S1,S2.  No m/r/g.   Abdomen: Benign, Soft, non-tender. Neurology UE/LE 5/5 strength, no focal deficits Ext  pulses intact, cap refill intact ALL OTHER ROS ARE NEGATIVE       ASSESSMENT/PLAN   78 year old white male seen today for assessment of COPD with significant secondhand smoke exposure no personal history of smoking with ongoing symptoms of dyspnea exertion with intermittent dry cough likely underlying COPD in the setting of severe multivessel coronary artery disease, in the setting of very deconditioned state and sedentary lifestyle   Assessment of COPD Continue Breztri  as prescribed Rinse mouth after use No exacerbation at this time No evidence of heart failure at this time No evidence or signs of infection at this time No respiratory distress No fevers, chills, nausea, vomiting, diarrhea No evidence of lower extremity edema No evidence hemoptysis Avoid Allergens and Irritants Avoid secondhand smoke Avoid SICK contacts Recommend  Masking  when appropriate Recommend Keep up-to-date with vaccinations Recommend chest x-ray Recommend pulmonary function testing  Severe multi vessel disease coronary artery disease Follow-up with cardiology as scheduled  Sedentary lifestyle Patient with significant deconditioned state Recommend exercise as tolerated   MEDICATION ADJUSTMENTS/LABS AND TESTS ORDERED: Chest x-ray PFT Continue Breztri  Rinse mouth Avoid secondhand smoke Increase exercise capacity   CURRENT MEDICATIONS REVIEWED AT LENGTH WITH PATIENT TODAY   Patient  satisfied with Plan of action and management. All questions answered  Follow up 4 weeks  I spent a total of 65 minutes reviewing chart data, face-to-face evaluation with the patient, counseling and coordination of care as detailed above.     Lady Pier, M.D.  [facility]  Medical Director [facility] Medical Director [facility] Cardio-Pulmonary Department

## 2024-03-29 ENCOUNTER — Encounter: Payer: Self-pay | Admitting: Internal Medicine

## 2024-03-29 ENCOUNTER — Other Ambulatory Visit: Payer: Self-pay | Admitting: Nurse Practitioner

## 2024-03-29 DIAGNOSIS — E1142 Type 2 diabetes mellitus with diabetic polyneuropathy: Secondary | ICD-10-CM

## 2024-04-02 NOTE — Telephone Encounter (Signed)
 Requested Prescriptions  Pending Prescriptions Disp Refills   metFORMIN  (GLUCOPHAGE ) 1000 MG tablet [Pharmacy Med Name: metFORMIN  HCl 1000 MG Oral Tablet] 200 tablet 1    Sig: TAKE 1 TABLET BY MOUTH TWICE  DAILY WITH MEALS     Endocrinology:  Diabetes - Biguanides Failed - 04/02/2024 10:31 AM      Failed - B12 Level in normal range and within 720 days    Vitamin B-12  Date Value Ref Range Status  02/08/2024 194 (L) 232 - 1,245 pg/mL Final         Passed - Cr in normal range and within 360 days    Creat  Date Value Ref Range Status  11/29/2017 1.03 0.70 - 1.18 mg/dL Final    Comment:    For patients >51 years of age, the reference limit for Creatinine is approximately 13% higher for people identified as African-American. .    Creatinine, Ser  Date Value Ref Range Status  02/08/2024 0.99 0.76 - 1.27 mg/dL Final   Creatinine, Urine  Date Value Ref Range Status  04/21/2017 206 20 - 370 mg/dL Final         Passed - HBA1C is between 0 and 7.9 and within 180 days    HB A1C (BAYER DCA - WAIVED)  Date Value Ref Range Status  12/05/2020 6.9 <7.0 % Final    Comment:                                          Diabetic Adult            <7.0                                       Healthy Adult        4.3 - 5.7                                                           (DCCT/NGSP) American Diabetes Association's Summary of Glycemic Recommendations for Adults with Diabetes: Hemoglobin A1c <7.0%. More stringent glycemic goals (A1c <6.0%) may further reduce complications at the cost of increased risk of hypoglycemia.    Hgb A1c MFr Bld  Date Value Ref Range Status  02/08/2024 5.7 (H) 4.8 - 5.6 % Final    Comment:             Prediabetes: 5.7 - 6.4          Diabetes: >6.4          Glycemic control for adults with diabetes: <7.0          Passed - eGFR in normal range and within 360 days    GFR, Est African American  Date Value Ref Range Status  04/21/2017 86 >=60 mL/min Final    GFR calc Af Amer  Date Value Ref Range Status  12/05/2020 91 >59 mL/min/1.73 Final    Comment:    **In accordance with recommendations from the NKF-ASN Task force,**   [facility] is in the process of updating its eGFR calculation to the   2021 CKD-EPI creatinine equation that estimates kidney function  without a race variable.    GFR, Est Non African American  Date Value Ref Range Status  04/21/2017 74 >=60 mL/min Final   GFR, Estimated  Date Value Ref Range Status  12/29/2022 >60 >60 mL/min Final    Comment:    (NOTE) Calculated using the CKD-EPI Creatinine Equation (2021)    eGFR  Date Value Ref Range Status  02/08/2024 78 >59 mL/min/1.73 Final         Passed - Valid encounter within last 6 months    Recent Outpatient Visits           4 weeks ago COPD with chronic bronchitis and emphysema (HCC)   [facility], Mariah Shines, NP   1 month ago Type 2 diabetes mellitus with peripheral neuropathy [facility])   [facility] Aileen Alexanders, NP              Passed - CBC within normal limits and completed in the last 12 months    WBC  Date Value Ref Range Status  02/08/2024 7.6 3.4 - 10.8 x10E3/uL Final  12/29/2022 11.2 (H) 4.0 - 10.5 K/uL Final   RBC  Date Value Ref Range Status  02/08/2024 4.71 4.14 - 5.80 x10E6/uL Final  12/29/2022 5.03 4.22 - 5.81 MIL/uL Final   Hemoglobin  Date Value Ref Range Status  02/08/2024 14.0 13.0 - 17.7 g/dL Final   Hematocrit  Date Value Ref Range Status  02/08/2024 43.9 37.5 - 51.0 % Final   MCHC  Date Value Ref Range Status  02/08/2024 31.9 31.5 - 35.7 g/dL Final  16/07/9603 54.0 30.0 - 36.0 g/dL Final   [facility]  Date Value Ref Range Status  02/08/2024 29.7 26.6 - 33.0 pg Final  12/29/2022 30.2 26.0 - 34.0 pg Final   MCV  Date Value Ref Range Status  02/08/2024 93 79 - 97 fL Final   No results found for: PLTCOUNTKUC, LABPLAT, POCPLA RDW  Date Value Ref Range  Status  02/08/2024 14.5 11.6 - 15.4 % Final

## 2024-04-18 ENCOUNTER — Ambulatory Visit: Admitting: Internal Medicine

## 2024-04-18 ENCOUNTER — Encounter

## 2024-05-15 ENCOUNTER — Ambulatory Visit (INDEPENDENT_AMBULATORY_CARE_PROVIDER_SITE_OTHER): Admitting: Nurse Practitioner

## 2024-05-15 ENCOUNTER — Encounter: Payer: Self-pay | Admitting: Nurse Practitioner

## 2024-05-15 VITALS — BP 131/75 | HR 60 | Temp 98.7°F | Ht 70.0 in | Wt 205.2 lb

## 2024-05-15 DIAGNOSIS — I152 Hypertension secondary to endocrine disorders: Secondary | ICD-10-CM | POA: Diagnosis not present

## 2024-05-15 DIAGNOSIS — E785 Hyperlipidemia, unspecified: Secondary | ICD-10-CM | POA: Diagnosis not present

## 2024-05-15 DIAGNOSIS — I700 Atherosclerosis of aorta: Secondary | ICD-10-CM | POA: Diagnosis not present

## 2024-05-15 DIAGNOSIS — I25110 Atherosclerotic heart disease of native coronary artery with unstable angina pectoris: Secondary | ICD-10-CM

## 2024-05-15 DIAGNOSIS — J4489 Other specified chronic obstructive pulmonary disease: Secondary | ICD-10-CM

## 2024-05-15 DIAGNOSIS — J439 Emphysema, unspecified: Secondary | ICD-10-CM

## 2024-05-15 DIAGNOSIS — E1169 Type 2 diabetes mellitus with other specified complication: Secondary | ICD-10-CM | POA: Diagnosis not present

## 2024-05-15 DIAGNOSIS — E1159 Type 2 diabetes mellitus with other circulatory complications: Secondary | ICD-10-CM | POA: Diagnosis not present

## 2024-05-15 DIAGNOSIS — E1142 Type 2 diabetes mellitus with diabetic polyneuropathy: Secondary | ICD-10-CM

## 2024-05-15 NOTE — Assessment & Plan Note (Signed)
 Chronic.  Continue with Statin therapy and ASA.

## 2024-05-15 NOTE — Assessment & Plan Note (Signed)
 Chronic.  Controlled.  Continue with current medication regimen of Gabapentin .  Continue with Trulicity .  Labs ordered today.  A1c has been well controlled at 5.7%.  Eye exam up to date.  Return to clinic in 3 months for reevaluation.  Call sooner if concerns arise.

## 2024-05-15 NOTE — Assessment & Plan Note (Signed)
 Chronic.  Controlled.  Continue with current medication regimen of telmisartan  and Clonidine . Labs ordered today. Will make recommendations based on lab results. Recommend checking blood pressures at home and bringing log to next visit.

## 2024-05-15 NOTE — Progress Notes (Signed)
 BP 131/75   Pulse 60   Temp 98.7 F (37.1 C) (Oral)   Ht 5' 10 (1.778 m)   Wt 205 lb 3.2 oz (93.1 kg)   SpO2 97%   BMI 29.44 kg/m    Subjective:    Patient ID: Juan Le., male    DOB: [birth year], 78 y.o.   MRN: [id number]  HPI: Juan Le. is a 77 y.o. male  Chief Complaint  Patient presents with   Diabetes   Hyperlipidemia   Hypertension    HYPERTENSION / HYPERLIPIDEMIA Doing well.  Denies concerns at visit today.  Satisfied with current treatment? yes Duration of hypertension: years BP monitoring frequency:no BP range:  BP medication side effects: no Past BP meds: telmisartan  and clonidine  Duration of hyperlipidemia: years Cholesterol medication side effects: no Cholesterol supplements: none Past cholesterol medications: atorvastain (lipitor) Medication compliance: excellent compliance Aspirin : yes Recent stressors: no Recurrent headaches: no Visual changes: no Palpitations: no Dyspnea: with excretion Chest pain: no Lower extremity edema: no Dizzy/lightheaded: no   DIABETES Now taking Farxiga  and Trulicity .  Denies concerns regarding medication at visit today.  A1c has been well controlled.  He stopped Farxiga  due to confusion.  Doesn't check sugars.  Hypoglycemic episodes:no Polydipsia/polyuria: no Visual disturbance: no Chest pain: no Paresthesias: yes Glucose Monitoring: no             Accucheck frequency: hasn't been checking it             Fasting glucose:             Post prandial:             Evening:             Before meals: Taking Insulin?: no             Long acting insulin:             Short acting insulin: Blood Pressure Monitoring: sometimes Retinal Examination: Up to Date Foot Exam: Up to Date Diabetic Education: Not Completed Pneumovax: Up to Date Influenza: Up to Date Aspirin : yes   COPD Now seeing Pulmonology.  On Breztri .  Does still have a cough. COPD status: controlled Satisfied with current  treatment?: yes Oxygen use: no Dyspnea frequency: nonel Cough frequency:  Rescue inhaler frequency:  none Limitation of activity: yes Productive cough:  Last Spirometry: 03/04/2021 Pneumovax: Up to Date Influenza: Up to Date   SLEEP APNEA Doesn't even have a CPAP machine.  Not interested in getting another machine or doing another sleep study.  Does not want to get CPAP. Sleep apnea status: uncontrolled Duration: chronic Satisfied with current treatment?:  no CPAP use:  no- didn't like it Sleep quality with CPAP use: good Treament compliance:poor compliance Last sleep study:  Treatments attempted: CPAP Wakes feeling refreshed:  yes Daytime hypersomnolence:  no Fatigue:  no Insomnia:  no Good sleep hygiene:  no Difficulty falling asleep:  no Difficulty staying asleep:  no Snoring bothers bed partner:  unknown Observed apnea by bed partner: no Obesity:  yes Hypertension: yes  Pulmonary hypertension:  no Coronary artery disease:  yes   NEUROPATHY Patient states his neuropathy is about the same.  States the pain is a burning in his feet.  Feels like the gabapentin  helps with his pain.  He feels like this dose is good. He still feels numbness but the pain does improve with gabapentin .  Relevant past medical, surgical, family and social history reviewed and updated  as indicated. Interim medical history since our last visit reviewed. Allergies and medications reviewed and updated.  Review of Systems  Eyes:  Negative for visual disturbance.  Respiratory:  Positive for cough. Negative for chest tightness and shortness of breath.   Cardiovascular:  Negative for chest pain, palpitations and leg swelling.  Endocrine: Negative for polydipsia and polyuria.  Neurological:  Positive for numbness. Negative for dizziness, light-headedness and headaches.    Per HPI unless specifically indicated above     Objective:    BP 131/75   Pulse 60   Temp 98.7 F (37.1 C) (Oral)   Ht 5'  10 (1.778 m)   Wt 205 lb 3.2 oz (93.1 kg)   SpO2 97%   BMI 29.44 kg/m   Wt Readings from Last 3 Encounters:  05/15/24 205 lb 3.2 oz (93.1 kg)  03/28/24 205 lb 3.2 oz (93.1 kg)  03/14/24 207 lb 4 oz (94 kg)    Physical Exam Vitals and nursing note reviewed.  Constitutional:      General: He is not in acute distress.    Appearance: Normal appearance. He is not ill-appearing, toxic-appearing or diaphoretic.  HENT:     Head: Normocephalic.     Right Ear: External ear normal.     Left Ear: External ear normal.     Nose: Nose normal. No congestion or rhinorrhea.     Mouth/Throat:     Mouth: Mucous membranes are moist.  Eyes:     General:        Right eye: No discharge.        Left eye: No discharge.     Extraocular Movements: Extraocular movements intact.     Conjunctiva/sclera: Conjunctivae normal.     Pupils: Pupils are equal, round, and reactive to light.  Cardiovascular:     Rate and Rhythm: Normal rate and regular rhythm.     Heart sounds: No murmur heard. Pulmonary:     Effort: Pulmonary effort is normal. No respiratory distress.     Breath sounds: Normal breath sounds. No wheezing, rhonchi or rales.  Abdominal:     General: Abdomen is flat. Bowel sounds are normal.  Musculoskeletal:     Cervical back: Normal range of motion and neck supple.  Skin:    General: Skin is warm and dry.     Capillary Refill: Capillary refill takes less than 2 seconds.  Neurological:     General: No focal deficit present.     Mental Status: He is alert and oriented to person, place, and time.  Psychiatric:        Mood and Affect: Mood normal.        Behavior: Behavior normal.        Thought Content: Thought content normal.        Judgment: Judgment normal.     Results for orders placed or performed in visit on 02/21/24  HM DIABETES EYE EXAM   Collection Time: 02/16/24  2:59 PM  Result Value Ref Range   HM Diabetic Eye Exam No Retinopathy No Retinopathy      Assessment & Plan:    Problem List Items Addressed This Visit       Cardiovascular and Mediastinum   Hypertension associated with diabetes (HCC)   Chronic.  Controlled.  Continue with current medication regimen of telmisartan  and Clonidine . Labs ordered today. Will make recommendations based on lab results. Recommend checking blood pressures at home and bringing log to next visit.  Coronary artery disease involving native coronary artery of native heart with unstable angina pectoris (HCC)   Chronic.  Continue with Statin therapy and ASA.      Aortic atherosclerosis (HCC)   Chronic. Continue with Statin therapy and ASA.        Respiratory   COPD with chronic bronchitis and emphysema (HCC)   Chronic.  Cough improved some.  Continue with Breztri .  Needs PFTs but hasn't gotten them scheduled.  Referral placed for VBCI for continuity of care.  Continue to follow up with Pulmonology.  Note reviewed.        Endocrine   Type 2 diabetes mellitus with peripheral neuropathy (HCC) - Primary   Chronic.  Controlled.  Continue with current medication regimen of Gabapentin .  Continue with Trulicity .  Labs ordered today.  A1c has been well controlled at 5.7%.  Eye exam up to date.  Return to clinic in 3 months for reevaluation.  Call sooner if concerns arise.       Relevant Orders   Comprehensive metabolic panel with GFR   Hemoglobin A1c   Hyperlipidemia associated with type 2 diabetes mellitus (HCC)   Chronic. Controlled.  Continue with current medication regimen of Atorvastatin  40mg  daily.  Return in 3 months.  Call sooner if concerns arise.      Relevant Orders   Lipid panel     Follow up plan: No follow-ups on file.

## 2024-05-15 NOTE — Assessment & Plan Note (Signed)
Chronic. Controlled.  Continue with current medication regimen of Atorvastatin '40mg'$  daily.  Return in 3 months.  Call sooner if concerns arise.

## 2024-05-15 NOTE — Assessment & Plan Note (Signed)
 Chronic.  Cough improved some.  Continue with Breztri .  Needs PFTs but hasn't gotten them scheduled.  Referral placed for VBCI for continuity of care.  Continue to follow up with Pulmonology.  Note reviewed.

## 2024-05-16 ENCOUNTER — Ambulatory Visit: Payer: Self-pay | Admitting: Nurse Practitioner

## 2024-05-16 LAB — COMPREHENSIVE METABOLIC PANEL WITH GFR
ALT: 19 IU/L (ref 0–44)
AST: 17 IU/L (ref 0–40)
Albumin: 4.3 g/dL (ref 3.8–4.8)
Alkaline Phosphatase: 64 IU/L (ref 44–121)
BUN/Creatinine Ratio: 14 (ref 10–24)
BUN: 14 mg/dL (ref 8–27)
Bilirubin Total: 0.4 mg/dL (ref 0.0–1.2)
CO2: 19 mmol/L — ABNORMAL LOW (ref 20–29)
Calcium: 9.2 mg/dL (ref 8.6–10.2)
Chloride: 102 mmol/L (ref 96–106)
Creatinine, Ser: 1.03 mg/dL (ref 0.76–1.27)
Globulin, Total: 2.5 g/dL (ref 1.5–4.5)
Glucose: 107 mg/dL — ABNORMAL HIGH (ref 70–99)
Potassium: 4.1 mmol/L (ref 3.5–5.2)
Sodium: 139 mmol/L (ref 134–144)
Total Protein: 6.8 g/dL (ref 6.0–8.5)
eGFR: 74 mL/min/1.73 (ref >59)

## 2024-05-16 LAB — LIPID PANEL
Chol/HDL Ratio: 4.1 ratio (ref 0.0–5.0)
Cholesterol, Total: 115 mg/dL (ref 100–199)
HDL: 28 mg/dL — ABNORMAL LOW (ref >39)
LDL Chol Calc (NIH): 57 mg/dL (ref 0–99)
Triglycerides: 181 mg/dL — ABNORMAL HIGH (ref 0–149)
VLDL Cholesterol Cal: 30 mg/dL (ref 5–40)

## 2024-05-16 LAB — HEMOGLOBIN A1C
Est. average glucose Bld gHb Est-mCnc: 114 mg/dL
Hgb A1c MFr Bld: 5.6 % (ref 4.8–5.6)

## 2024-05-29 ENCOUNTER — Other Ambulatory Visit: Payer: Self-pay | Admitting: Nurse Practitioner

## 2024-05-29 DIAGNOSIS — E1159 Type 2 diabetes mellitus with other circulatory complications: Secondary | ICD-10-CM

## 2024-06-01 NOTE — Telephone Encounter (Signed)
 Requested Prescriptions  Pending Prescriptions Disp Refills   telmisartan  (MICARDIS ) 40 MG tablet [Pharmacy Med Name: Telmisartan  40 MG Oral Tablet] 100 tablet 1    Sig: TAKE 1 TABLET BY MOUTH DAILY     Cardiovascular:  Angiotensin Receptor Blockers Passed - 06/01/2024 12:32 PM      Passed - Cr in normal range and within 180 days    Creat  Date Value Ref Range Status  11/29/2017 1.03 0.70 - 1.18 mg/dL Final    Comment:    For patients >78 years of age, the reference limit for Creatinine is approximately 13% higher for people identified as African-American. .    Creatinine, Ser  Date Value Ref Range Status  05/15/2024 1.03 0.76 - 1.27 mg/dL Final   Creatinine, Urine  Date Value Ref Range Status  04/21/2017 206 20 - 370 mg/dL Final         Passed - K in normal range and within 180 days    Potassium  Date Value Ref Range Status  05/15/2024 4.1 3.5 - 5.2 mmol/L Final         Passed - Patient is not pregnant      Passed - Last BP in normal range    BP Readings from Last 1 Encounters:  05/15/24 131/75         Passed - Valid encounter within last 6 months    Recent Outpatient Visits           2 weeks ago Type 2 diabetes mellitus with peripheral neuropathy [facility])   [place] [facility] Melvin Pao, NP   2 months ago COPD with chronic bronchitis and emphysema [facility])   [facility] Melvin Pao, NP   3 months ago Type 2 diabetes mellitus with peripheral neuropathy [facility])   [place] [facility] Melvin Pao, NP

## 2024-06-13 ENCOUNTER — Other Ambulatory Visit: Payer: Self-pay

## 2024-06-13 NOTE — Telephone Encounter (Signed)
 Copied from CRM #[id number]. Topic: Clinical - Medication Question >> Jun 12, 2024  1:10 PM Teressa P wrote: Reason for CRM: patient called asking for Breztri .  He was prescribed that by Dr. Francis.  He wants to know if Darice will prescribe it for him.  He is out.  If so he uses [address] in [place].  CB#  [phone]

## 2024-06-14 MED ORDER — BREZTRI AEROSPHERE 160-9-4.8 MCG/ACT IN AERO: 2.0000 | INHALATION_SPRAY | Freq: Two times a day (BID) | RESPIRATORY_TRACT | Status: DC

## 2024-06-27 ENCOUNTER — Other Ambulatory Visit: Payer: Self-pay | Admitting: Nurse Practitioner

## 2024-06-27 ENCOUNTER — Telehealth: Payer: Self-pay

## 2024-06-27 DIAGNOSIS — E1169 Type 2 diabetes mellitus with other specified complication: Secondary | ICD-10-CM

## 2024-06-27 DIAGNOSIS — E1142 Type 2 diabetes mellitus with diabetic polyneuropathy: Secondary | ICD-10-CM

## 2024-06-27 NOTE — Progress Notes (Signed)
   06/27/2024  Patient ID: Juan DELENA Fern Mickey., male   DOB: [birth year], 78 y.o.   MRN: [id number]  Missed call/voicemail from patient stating he is down to 2 pens of Trulicity  0.75mg  on hand.  Patient receives medication through [facility].  I contacted [facility] to check on the patient's next refill, and an order is in progress currently and should arrive at his home by the beginning of next week.  Patient is also enrolled in automatic refills and enrollment is good through 10/17/2024.  Contacted patient to make him aware.  Juan Le, PharmD, DPLA

## 2024-06-28 NOTE — Telephone Encounter (Signed)
 Requested Prescriptions  Pending Prescriptions Disp Refills   atorvastatin  (LIPITOR) 40 MG tablet [Pharmacy Med Name: Atorvastatin  Calcium  40 MG Oral Tablet] 100 tablet 2    Sig: TAKE 1 TABLET BY MOUTH DAILY     Cardiovascular:  Antilipid - Statins Failed - 06/28/2024 10:54 AM      Failed - Lipid Panel in normal range within the last 12 months    Cholesterol, Total  Date Value Ref Range Status  05/15/2024 115 100 - 199 mg/dL Final   LDL Cholesterol (Calc)  Date Value Ref Range Status  11/29/2017 68 mg/dL (calc) Final    Comment:    Reference range: <100 . Desirable range <100 mg/dL for primary prevention;   <70 mg/dL for patients with CHD or diabetic patients  with > or = 2 CHD risk factors. SABRA LDL-C is now calculated using the Martin-Hopkins  calculation, which is a validated novel method providing  better accuracy than the Friedewald equation in the  estimation of LDL-C.  Gladis APPLETHWAITE et al. SANDREA. 7986;689(80): 2061-2068  (http://education.[employer].com/faq/FAQ164)    LDL Chol Calc (NIH)  Date Value Ref Range Status  05/15/2024 57 0 - 99 mg/dL Final   HDL  Date Value Ref Range Status  05/15/2024 28 (L) >39 mg/dL Final   Triglycerides  Date Value Ref Range Status  05/15/2024 181 (H) 0 - 149 mg/dL Final         Passed - Patient is not pregnant      Passed - Valid encounter within last 12 months    Recent Outpatient Visits           1 month ago Type 2 diabetes mellitus with peripheral neuropathy (HCC)   [place] [facility] Melvin Pao, NP   3 months ago COPD with chronic bronchitis and emphysema (HCC)   [place] [facility] Melvin Pao, NP   4 months ago Type 2 diabetes mellitus with peripheral neuropathy [facility])   [place] [facility] Melvin Pao, NP              Refused Prescriptions Disp Refills   gabapentin  (NEURONTIN ) 400 MG capsule [Pharmacy Med Name: Gabapentin  400 MG Oral Capsule]  300 capsule     Sig: TAKE 1 CAPSULE BY MOUTH 3 TIMES  DAILY     Neurology: Anticonvulsants - gabapentin  Passed - 06/28/2024 10:54 AM      Passed - Cr in normal range and within 360 days    Creat  Date Value Ref Range Status  11/29/2017 1.03 0.70 - 1.18 mg/dL Final    Comment:    For patients >27 years of age, the reference limit for Creatinine is approximately 13% higher for people identified as African-American. .    Creatinine, Ser  Date Value Ref Range Status  05/15/2024 1.03 0.76 - 1.27 mg/dL Final   Creatinine, Urine  Date Value Ref Range Status  04/21/2017 206 20 - 370 mg/dL Final         Passed - Completed PHQ-2 or PHQ-9 in the last 360 days      Passed - Valid encounter within last 12 months    Recent Outpatient Visits           1 month ago Type 2 diabetes mellitus with peripheral neuropathy [facility])   [place] [facility] Melvin Pao, NP   3 months ago COPD with chronic bronchitis and emphysema [facility])   [place] [facility] Melvin Pao, NP   4 months  ago Type 2 diabetes mellitus with peripheral neuropathy [facility])   [place] [facility] Melvin Pao, NP

## 2024-07-04 ENCOUNTER — Other Ambulatory Visit: Payer: Self-pay | Admitting: Nurse Practitioner

## 2024-07-04 DIAGNOSIS — K219 Gastro-esophageal reflux disease without esophagitis: Secondary | ICD-10-CM

## 2024-07-04 DIAGNOSIS — E1142 Type 2 diabetes mellitus with diabetic polyneuropathy: Secondary | ICD-10-CM

## 2024-07-05 NOTE — Telephone Encounter (Signed)
 Gabapentin  discontinued on 03/05/24. Too soon for refill, L[facility] 11/15/23 FOR 100 AND 2 RF.  Requested Prescriptions  Pending Prescriptions Disp Refills   gabapentin  (NEURONTIN ) 400 MG capsule [Pharmacy Med Name: Gabapentin  400 MG Oral Capsule] 300 capsule 2    Sig: TAKE 1 CAPSULE BY MOUTH 3 TIMES  DAILY     Neurology: Anticonvulsants - gabapentin  Passed - 07/05/2024  3:56 PM      Passed - Cr in normal range and within 360 days    Creat  Date Value Ref Range Status  11/29/2017 1.03 0.70 - 1.18 mg/dL Final    Comment:    For patients >84 years of age, the reference limit for Creatinine is approximately 13% higher for people identified as African-American. .    Creatinine, Ser  Date Value Ref Range Status  05/15/2024 1.03 0.76 - 1.27 mg/dL Final   Creatinine, Urine  Date Value Ref Range Status  04/21/2017 206 20 - 370 mg/dL Final         Passed - Completed PHQ-2 or PHQ-9 in the last 360 days      Passed - Valid encounter within last 12 months    Recent Outpatient Visits           1 month ago Type 2 diabetes mellitus with peripheral neuropathy [facility])   [place] [facility] Melvin Pao, NP   4 months ago COPD with chronic bronchitis and emphysema (HCC)   [place] [facility] [facility] Melvin Pao, NP   4 months ago Type 2 diabetes mellitus with peripheral neuropathy [facility])   [place] [facility] Melvin Pao, NP               omeprazole  (PRILOSEC) 40 MG capsule [Pharmacy Med Name: Omeprazole  40 MG Oral Capsule Delayed Release] 100 capsule 2    Sig: TAKE 1 CAPSULE BY MOUTH DAILY     Gastroenterology: Proton Pump Inhibitors Passed - 07/05/2024  3:56 PM      Passed - Valid encounter within last 12 months    Recent Outpatient Visits           1 month ago Type 2 diabetes mellitus with peripheral neuropathy [facility])   [place] [facility] Melvin Pao, NP   4 months ago COPD with chronic bronchitis and  emphysema (HCC)   [place] [facility] Melvin Pao, NP   4 months ago Type 2 diabetes mellitus with peripheral neuropathy [facility])   [place] [facility] Melvin Pao, NP

## 2024-07-10 ENCOUNTER — Ambulatory Visit: Admitting: Internal Medicine

## 2024-07-10 ENCOUNTER — Encounter: Payer: Self-pay | Admitting: Internal Medicine

## 2024-07-10 VITALS — BP 120/80 | HR 52 | Temp 98.1°F | Ht 70.0 in | Wt 204.8 lb

## 2024-07-10 DIAGNOSIS — J449 Chronic obstructive pulmonary disease, unspecified: Secondary | ICD-10-CM

## 2024-07-10 MED ORDER — BREZTRI AEROSPHERE 160-9-4.8 MCG/ACT IN AERO: 2.0000 | INHALATION_SPRAY | Freq: Two times a day (BID) | RESPIRATORY_TRACT | Status: AC

## 2024-07-10 NOTE — Patient Instructions (Addendum)
 Please continue Breztri  as prescribed Rinse mouth after use  At this time you are refusing breathing test however if you change your mind please let us  know  Recommend obtaining chest x-ray to assess your lungs Recommend overnight pulse oximetry to assess oxygen levels at night  Avoid Allergens and Irritants Avoid secondhand smoke Avoid SICK contacts Recommend  Masking  when appropriate Recommend Keep up-to-date with vaccinations

## 2024-07-10 NOTE — Progress Notes (Signed)
 [facility] Consultation      Date: 07/10/2024,   MRN# [id number] Juan Le. [birth year]    CHIEF COMPLAINT:  Follow up assessment of COPD  HISTORY OF PRESENT ILLNESS   Establish diagnosis for assessment of COPD Patient is a non-smoker however has significant secondhand smoke exposure his whole life started on Breztri  inhaler by his PCP and seems to help his symptoms Ambulating pulse oximetry in the office  did not reveal hypoxia  Assessment of COPD needs to be assessed with pulmonary function test and patient refuses to have pulmonary function testing at this time  Patient did not obtain pulmonary function testing or chest x-ray Patient continues to use Breztri  inhaler is asking for a sample  No exacerbation at this time No evidence of heart failure at this time No evidence or signs of infection at this time No respiratory distress No fevers, chills, nausea, vomiting, diarrhea No evidence of lower extremity edema No evidence hemoptysis   Cardiology assessment Coronary artery disease: Status post CABG x 4 in 1997 with PCI of the vein graft to the diagonal in 2008.  Most recent catheterization in early 2024 showed 1 of 4 patent grafts (LIMA to the LAD patent), with severe multivessel native coronary artery disease.  He has been medically managed since.   He is very sedentary.  He remains on aspirin , statin, beta-blocker, and ARB.   PAST MEDICAL HISTORY   Past Medical History:  Diagnosis Date   Anxiety    Atelectasis of left lung 03/25/2017   Chronic, continuous use of opioids    COPD (chronic obstructive pulmonary disease) (HCC)    Coronary artery disease    a. 1997 s/p CABG x 4 (LIMA->LAD, VG->OM2, VG->D2, VG->RPDA); b. 2008 Cath/PCI: VG->D1 90ost (s/p DES). Other grafts patent; c. 12/2022 MV: antlat/inflat/inf ischemia/infarct; d. 12/2022 Cath: LM diff dzs, LAD 100ost, LCX ok, OM2 100, OM branches fill via R->L collats from RV marginal  branch,RCA 60p, 100d, RPL3 fills via collats from OM3, VG->OM2 100, VG->D2 100, VG->RPDA 100, LIMA->LAD ok-->Med Rx.   Degenerative disc disease, lumbar    Diabetes (HCC)    Diastolic dysfunction    a. 01/2023 Echo: EF 55-60%, no rwma, GrI DD, nl RV fxn, mild MR, Ao sclerosis.   DJD (degenerative joint disease)    GERD (gastroesophageal reflux disease)    Heart attack (HCC)    History of diverticulitis    Hypertension    Hypertriglyceridemia    Hypokalemia    Insomnia    Mitral regurgitation    Sleep apnea    Vitamin D  deficiency    Wears dentures    full upper     SURGICAL HISTORY   Past Surgical History:  Procedure Laterality Date   COLONOSCOPY WITH PROPOFOL  N/A 01/28/2017   Procedure: COLONOSCOPY WITH PROPOFOL ;  Surgeon: Rogelia Copping, MD;  [facility];  Service: Endoscopy;  Laterality: N/A;  diabetic - oral meds   COLONOSCOPY WITH PROPOFOL  N/A 06/29/2022   Procedure: COLONOSCOPY WITH PROPOFOL ;  Surgeon: Copping Rogelia, MD;  Location: [facility] END[facility];  Service: Endoscopy;  Laterality: N/A;   CORONARY ARTERY BYPASS GRAFT  1997   CORONARY STENT PLACEMENT  11/2006   FOOT SURGERY  2003   HERNIA REPAIR     LEFT HEART CATH AND CORONARY ANGIOGRAPHY Left 12/31/2022   Procedure: LEFT HEART CATH AND CORONARY ANGIOGRAPHY;  Surgeon: Darron Deatrice DELENA, MD;  Location: [facility] INVASIVE CV LAB;  Service: Cardiovascular;  Laterality: Left;   POLYPECTOMY  N/A 01/28/2017   Procedure: POLYPECTOMY;  Surgeon: Rogelia Copping, MD;  Location: [facility];  Service: Endoscopy;  Laterality: N/A;   UMBILICAL HERNIA REPAIR  04/2012     FAMILY HISTORY   Family History  Problem Relation Age of Onset   Throat cancer Father    Colon cancer Father    Heart disease Father    Cancer Father    Depression Father    Hypertension Father    Parkinson's disease Father    Arthritis Brother    Stroke Mother    Kidney disease Mother    Heart disease Mother    Kidney failure Mother    Breast  cancer Paternal Aunt    Depression Sister    Stroke Maternal Grandmother    Bone cancer Brother    Stroke Brother    Prostate cancer Neg Hx    Kidney cancer Neg Hx    Bladder Cancer Neg Hx      SOCIAL HISTORY   Social History   Tobacco Use   Smoking status: Former    Current packs/day: 0.00    Types: Cigarettes    Quit date: 10/19/1955    Years since quitting: 68.7    Passive exposure: Past   Smokeless tobacco: Never   Tobacco comments:    former light smoker; quit in his teens  Vaping Use   Vaping status: Never Used  Substance Use Topics   Alcohol  use: No    Alcohol /week: 0.0 standard drinks of alcohol    Drug use: No     MEDICATIONS    Home Medication:  Current Outpatient Rx   Order #: [id number] Class: Historical Med   Order #: [id number] Class: Normal   Order #: [id number] Class: Sample   Order #: [id number] Class: Sample   Order #: [id number] Class: Normal   Order #: [id number] Class: Normal   Order #: [id number] Class: Normal   Order #: [id number] Class: Normal   Order #: [id number] Class: Normal   Order #: [id number] Class: Normal    Current Medication:  Current Outpatient Medications:    aspirin  81 MG tablet, Take 1 tablet by mouth daily., Disp: , Rfl:    atorvastatin  (LIPITOR) 40 MG tablet, TAKE 1 TABLET BY MOUTH DAILY, Disp: 100 tablet, Rfl: 2   budesonide-glycopyrrolate-formoterol (BREZTRI  AEROSPHERE) 160-9-4.8 MCG/ACT AERO inhaler, Inhale 2 puffs into the lungs in the morning and at bedtime. Please rinse mouth after every use., Disp: , Rfl:    budesonide-glycopyrrolate-formoterol (BREZTRI  AEROSPHERE) 160-9-4.8 MCG/ACT AERO inhaler, Inhale 2 puffs into the lungs 2 (two) times daily., Disp: , Rfl:    carvedilol  (COREG ) 6.25 MG tablet, Take 1 tablet (6.25 mg total) by mouth 2 (two) times daily., Disp: 180 tablet, Rfl: 2   Dulaglutide  (TRULICITY ) 0.75 MG/0.5ML SOAJ, Inject 0.75 mg into the skin once a week., Disp: 6 mL, Rfl: 1   metFORMIN  (GLUCOPHAGE ) 1000 MG tablet, TAKE  1 TABLET BY MOUTH TWICE  DAILY WITH MEALS, Disp: 200 tablet, Rfl: 1   omeprazole  (PRILOSEC) 40 MG capsule, TAKE 1 CAPSULE BY MOUTH DAILY, Disp: 100 capsule, Rfl: 2   QUEtiapine  (SEROQUEL ) 200 MG tablet, TAKE 1 TABLET BY MOUTH AT  BEDTIME, Disp: 90 tablet, Rfl: 3   telmisartan  (MICARDIS ) 40 MG tablet, TAKE 1 TABLET BY MOUTH DAILY, Disp: 100 tablet, Rfl: 1    ALLERGIES   Ace inhibitors and Diphenhydramine  BP 120/80   Pulse (!) 52   Temp 98.1 F (36.7 C)   Ht 5' 10 (1.778 m)   Wt 204 lb 12.8 oz (92.9  kg)   SpO2 98%   BMI 29.39 kg/m       Review of Systems: Gen:  Denies  fever, sweats, chills weight loss  HEENT: Denies blurred vision, double vision, ear pain, eye pain, hearing loss, nose bleeds, sore throat Cardiac:  No dizziness, chest pain or heaviness, chest tightness,edema, No JVD Resp:   No cough, -sputum production, -shortness of breath,-wheezing, -hemoptysis,  Other:  All other systems negative   Physical Examination:   General Appearance: No distress  EYES PERRLA, EOM intact.   NECK Supple, No JVD Pulmonary: normal breath sounds, No wheezing.  CardiovascularNormal S1,S2.  No m/r/g.   Abdomen: Benign, Soft, non-tender. Neurology UE/LE 5/5 strength, no focal deficits Ext pulses intact, cap refill intact ALL OTHER ROS ARE NEGATIVE        ASSESSMENT/PLAN   78 year old white male seen today for assessment of COPD with significant secondhand smoke exposure no personal history of smoking with ongoing symptoms of dyspnea exertion with intermittent dry cough likely underlying COPD in the setting of severe multivessel coronary artery disease, in the setting of very deconditioned state and sedentary lifestyle   Assessment of COPD Continue Breztri  as prescribed Rinse mouth after use No exacerbation at this time No evidence of heart failure at this time No evidence or signs of infection at this time No respiratory distress No fevers, chills, nausea, vomiting,  diarrhea No evidence of lower extremity edema No evidence hemoptysis Avoid Allergens and Irritants Avoid secondhand smoke Avoid SICK contacts Recommend  Masking  when appropriate Recommend Keep up-to-date with vaccinations Patient refusing breathing test at this time with pulmonary function testing  Severe multi vessel disease coronary artery disease Follow-up with cardiology as scheduled  Sedentary lifestyle Patient with significant deconditioned state Recommend exercise as tolerated   MEDICATION ADJUSTMENTS/LABS AND TESTS ORDERED: Obtain CXR Patient refusing PFT Continue Breztri  Rinse mouth after every use Avoid secondhand smoke Increase exercise capacity Recommend an overnight pulse oximetry   CURRENT MEDICATIONS REVIEWED AT LENGTH WITH PATIENT TODAY   Patient  satisfied with Plan of action and management. All questions answered   Follow up 6 months   I spent a total of 42 minutes dedicated to the care of this patient on the date of this encounter to include pre-visit review of records, face-to-face time with the patient discussing conditions above, post visit ordering of testing, clinical documentation with the electronic health record, making appropriate referrals as documented, and communicating necessary information to the patient's healthcare team.    The Patient requires high complexity decision making for assessment and support, frequent evaluation and titration of therapies, application of advanced monitoring technologies and extensive interpretation of multiple databases.  Patient satisfied with Plan of action and management. All questions answered    Nickolas Alm Cellar, M.D.  [facility]  Medical Director [facility] Medical Director [facility] Cardio-Pulmonary Department

## 2024-07-17 DIAGNOSIS — R0902 Hypoxemia: Secondary | ICD-10-CM | POA: Diagnosis not present

## 2024-07-17 DIAGNOSIS — G4730 Sleep apnea, unspecified: Secondary | ICD-10-CM | POA: Diagnosis not present

## 2024-07-26 ENCOUNTER — Ambulatory Visit: Payer: Self-pay | Admitting: Internal Medicine

## 2024-08-13 ENCOUNTER — Ambulatory Visit (INDEPENDENT_AMBULATORY_CARE_PROVIDER_SITE_OTHER): Admitting: Nurse Practitioner

## 2024-08-13 ENCOUNTER — Encounter: Payer: Self-pay | Admitting: Nurse Practitioner

## 2024-08-13 ENCOUNTER — Ambulatory Visit: Payer: Self-pay | Admitting: Nurse Practitioner

## 2024-08-13 ENCOUNTER — Ambulatory Visit
Admission: RE | Admit: 2024-08-13 | Discharge: 2024-08-13 | Disposition: A | Discharge status: Home / Self Care | Source: Ambulatory Visit | Attending: Nurse Practitioner | Admitting: Nurse Practitioner

## 2024-08-13 VITALS — BP 157/76 | HR 65 | Temp 98.0°F | Ht 70.0 in | Wt 199.8 lb

## 2024-08-13 DIAGNOSIS — E1159 Type 2 diabetes mellitus with other circulatory complications: Secondary | ICD-10-CM | POA: Diagnosis not present

## 2024-08-13 DIAGNOSIS — E119 Type 2 diabetes mellitus without complications: Secondary | ICD-10-CM

## 2024-08-13 DIAGNOSIS — I152 Hypertension secondary to endocrine disorders: Secondary | ICD-10-CM

## 2024-08-13 DIAGNOSIS — J4489 Other specified chronic obstructive pulmonary disease: Secondary | ICD-10-CM

## 2024-08-13 DIAGNOSIS — E1169 Type 2 diabetes mellitus with other specified complication: Secondary | ICD-10-CM | POA: Diagnosis not present

## 2024-08-13 DIAGNOSIS — R1085 Abdominal pain of multiple sites: Secondary | ICD-10-CM | POA: Insufficient documentation

## 2024-08-13 DIAGNOSIS — J439 Emphysema, unspecified: Secondary | ICD-10-CM

## 2024-08-13 DIAGNOSIS — E785 Hyperlipidemia, unspecified: Secondary | ICD-10-CM

## 2024-08-13 DIAGNOSIS — Z7985 Long-term (current) use of injectable non-insulin antidiabetic drugs: Secondary | ICD-10-CM

## 2024-08-13 DIAGNOSIS — I25110 Atherosclerotic heart disease of native coronary artery with unstable angina pectoris: Secondary | ICD-10-CM | POA: Diagnosis not present

## 2024-08-13 DIAGNOSIS — E1142 Type 2 diabetes mellitus with diabetic polyneuropathy: Secondary | ICD-10-CM

## 2024-08-13 LAB — POCT I-STAT CREATININE: Creatinine, Ser: 1.1 mg/dL (ref 0.61–1.24)

## 2024-08-13 MED ORDER — DICYCLOMINE HCL 10 MG PO CAPS: 10.0000 mg | ORAL_CAPSULE | Freq: Three times a day (TID) | ORAL | 1 refills | Status: AC

## 2024-08-13 MED ORDER — IOHEXOL 300 MG/ML  SOLN
100.0000 mL | Freq: Once | INTRAMUSCULAR | Status: AC | PRN
  Administered 2024-08-13: 100 mL via INTRAVENOUS

## 2024-08-13 MED ORDER — PANTOPRAZOLE SODIUM 40 MG PO TBEC: 40.0000 mg | DELAYED_RELEASE_TABLET | Freq: Every day | ORAL | 1 refills | Status: AC

## 2024-08-13 NOTE — Assessment & Plan Note (Signed)
Chronic. Controlled.  Continue with current medication regimen of Atorvastatin '40mg'$  daily.  Return in 3 months.  Call sooner if concerns arise.

## 2024-08-13 NOTE — Assessment & Plan Note (Signed)
 Chronic.  Cough improved some.  Continue with Breztri .  Continue to follow up with Pulmonology.  Note reviewed.

## 2024-08-13 NOTE — Progress Notes (Deleted)
   There were no vitals taken for this visit.   Subjective:    Patient ID: Juan Le., male    DOB: 1945/10/30, 78 y.o.   MRN: [id number]  HPI: Juan Le. is a 78 y.o. male  No chief complaint on file.   Relevant past medical, surgical, family and social history reviewed and updated as indicated. Interim medical history since our last visit reviewed. Allergies and medications reviewed and updated.  Review of Systems  Per HPI unless specifically indicated above     Objective:    There were no vitals taken for this visit.  Wt Readings from Last 3 Encounters:  07/10/24 204 lb 12.8 oz (92.9 kg)  05/15/24 205 lb 3.2 oz (93.1 kg)  03/28/24 205 lb 3.2 oz (93.1 kg)    Physical Exam  Results for orders placed or performed in visit on 05/15/24  Comprehensive metabolic panel with GFR   Collection Time: 05/15/24  9:13 AM  Result Value Ref Range   Glucose 107 (H) 70 - 99 mg/dL   BUN 14 8 - 27 mg/dL   Creatinine, Ser 8.96 0.76 - 1.27 mg/dL   eGFR 74 >40 fO/fpw/8.26   BUN/Creatinine Ratio 14 10 - 24   Sodium 139 134 - 144 mmol/L   Potassium 4.1 3.5 - 5.2 mmol/L   Chloride 102 96 - 106 mmol/L   CO2 19 (L) 20 - 29 mmol/L   Calcium  9.2 8.6 - 10.2 mg/dL   Total Protein 6.8 6.0 - 8.5 g/dL   Albumin 4.3 3.8 - 4.8 g/dL   Globulin, Total 2.5 1.5 - 4.5 g/dL   Bilirubin Total 0.4 0.0 - 1.2 mg/dL   Alkaline Phosphatase 64 44 - 121 IU/L   AST 17 0 - 40 IU/L   ALT 19 0 - 44 IU/L  Hemoglobin A1c   Collection Time: 05/15/24  9:13 AM  Result Value Ref Range   Hgb A1c MFr Bld 5.6 4.8 - 5.6 %   Est. average glucose Bld gHb Est-mCnc 114 mg/dL  Lipid panel   Collection Time: 05/15/24  9:13 AM  Result Value Ref Range   Cholesterol, Total 115 100 - 199 mg/dL   Triglycerides 818 (H) 0 - 149 mg/dL   HDL 28 (L) >60 mg/dL   VLDL Cholesterol Cal 30 5 - 40 mg/dL   LDL Chol Calc (NIH) 57 0 - 99 mg/dL   Chol/HDL Ratio 4.1 0.0 - 5.0 ratio      Assessment & Plan:   Problem  List Items Addressed This Visit   None    Follow up plan: No follow-ups on file.

## 2024-08-13 NOTE — Assessment & Plan Note (Signed)
 Abdominal pain x 2 weeks. Large ventral hernia, guarding, adominal distention on exam. Unable to evaluate for Murphy's sign due to pain. Also having diarrhea. Will rule out strangulated hernia.

## 2024-08-13 NOTE — Assessment & Plan Note (Signed)
 Chronic.  Continue with Statin therapy and ASA.

## 2024-08-13 NOTE — Progress Notes (Signed)
 BP (!) 157/76   Pulse 65   Temp 98 F (36.7 C) (Oral)   Ht 5' 10 (1.778 m)   Wt 199 lb 12.8 oz (90.6 kg)   SpO2 97%   BMI 28.67 kg/m    Subjective:    Patient ID: Juan Le., male    DOB: [birth year], 78 y.o.   MRN: [id number]  HPI: Juan Le. is a 78 y.o. male  Chief Complaint  Patient presents with   Abdominal Pain    Patient states he has been having shooting upper abdominal pains for the last 2 weeks. States the pain is pretty constant, does stop occasionally but the pain comes right back.    Diabetes   Hyperlipidemia   Hypertension    HYPERTENSION / HYPERLIPIDEMIA Doing well.  Denies concerns at visit today.  Satisfied with current treatment? yes Duration of hypertension: years BP monitoring frequency:no BP range:  BP medication side effects: no Past BP meds: telmisartan  and carvedilol  Duration of hyperlipidemia: years Cholesterol medication side effects: no Cholesterol supplements: none Past cholesterol medications: atorvastain (lipitor) Medication compliance: excellent compliance Aspirin : yes Recent stressors: no Recurrent headaches: no Visual changes: no Palpitations: no Dyspnea: with excretion Chest pain: no Lower extremity edema: no Dizzy/lightheaded: no   DIABETES Now taking metformin  1000mg  Trulicity  0.75mg .  Denies concerns regarding medication at visit today.  A1c has been well controlled.  Doesn't check sugars.  Hypoglycemic episodes:no Polydipsia/polyuria: no Visual disturbance: no Chest pain: no Paresthesias: yes Glucose Monitoring: no             Accucheck frequency: hasn't been checking it             Fasting glucose:             Post prandial:             Evening:             Before meals: Taking Insulin?: no             Long acting insulin:             Short acting insulin: Blood Pressure Monitoring: sometimes Retinal Examination: Up to Date Foot Exam: Up to Date Diabetic Education: Not  Completed Pneumovax: Up to Date Influenza: Up to Date Aspirin : yes   COPD Now seeing Pulmonology.  On Breztri .   COPD status: controlled Satisfied with current treatment?: yes Oxygen use: no Dyspnea frequency: nonel Cough frequency:  Rescue inhaler frequency:  none Limitation of activity: yes Productive cough:  Last Spirometry: 03/04/2021 Pneumovax: Up to Date Influenza: Up to Date   SLEEP APNEA Doesn't even have a CPAP machine.  Not interested in getting another machine or doing another sleep study.  Does not want to get CPAP. Sleep apnea status: uncontrolled Duration: chronic Satisfied with current treatment?:  no CPAP use:  no- didn't like it Sleep quality with CPAP use: good Treament compliance:poor compliance Last sleep study:  Treatments attempted: CPAP Wakes feeling refreshed:  yes Daytime hypersomnolence:  no Fatigue:  no Insomnia:  no Good sleep hygiene:  no Difficulty falling asleep:  no Difficulty staying asleep:  no Snoring bothers bed partner:  unknown Observed apnea by bed partner: no Obesity:  yes Hypertension: yes  Pulmonary hypertension:  no Coronary artery disease:  yes   NEUROPATHY Patient states his neuropathy is about the same.  He stopped taking Gabapentin  and it didn't make any difference in his pain.  ABDOMINAL ISSUES Lost 5lbs in the  last month. Decreased appetite.  Duration: at least 2 weeks Nature: sharp and burning Location: epigastric  Severity: 10/10  Radiation: no Episode duration: Frequency: constant but gets more intense Alleviating factors: nothing Aggravating factors: nothing Treatments attempted: none Constipation: no Diarrhea: yes Episodes of diarrhea/day: Mucous in the stool: no Heartburn: yes Bloating:yes Flatulence: no Nausea: no Vomiting: no Episodes of vomit/day: Melena or hematochezia: no Rash: no Jaundice: no Fever: no Weight loss: no   Relevant past medical, surgical, family and social history  reviewed and updated as indicated. Interim medical history since our last visit reviewed. Allergies and medications reviewed and updated.  Review of Systems  Constitutional:  Negative for fever.  Eyes:  Negative for visual disturbance.  Respiratory:  Negative for cough, chest tightness and shortness of breath.   Cardiovascular:  Negative for chest pain, palpitations and leg swelling.  Gastrointestinal:  Positive for abdominal distention, abdominal pain and diarrhea. Negative for anal bleeding, blood in stool, constipation, nausea, rectal pain and vomiting.  Endocrine: Negative for polydipsia and polyuria.  Neurological:  Positive for numbness. Negative for dizziness, light-headedness and headaches.    Per HPI unless specifically indicated above     Objective:    BP (!) 157/76   Pulse 65   Temp 98 F (36.7 C) (Oral)   Ht 5' 10 (1.778 m)   Wt 199 lb 12.8 oz (90.6 kg)   SpO2 97%   BMI 28.67 kg/m   Wt Readings from Last 3 Encounters:  08/13/24 199 lb 12.8 oz (90.6 kg)  07/10/24 204 lb 12.8 oz (92.9 kg)  05/15/24 205 lb 3.2 oz (93.1 kg)    Physical Exam Vitals and nursing note reviewed.  Constitutional:      General: He is not in acute distress.    Appearance: Normal appearance. He is not ill-appearing, toxic-appearing or diaphoretic.  HENT:     Head: Normocephalic.     Right Ear: External ear normal.     Left Ear: External ear normal.     Nose: Nose normal. No congestion or rhinorrhea.     Mouth/Throat:     Mouth: Mucous membranes are moist.  Eyes:     General:        Right eye: No discharge.        Left eye: No discharge.     Extraocular Movements: Extraocular movements intact.     Conjunctiva/sclera: Conjunctivae normal.     Pupils: Pupils are equal, round, and reactive to light.  Cardiovascular:     Rate and Rhythm: Normal rate and regular rhythm.     Heart sounds: No murmur heard. Pulmonary:     Effort: Pulmonary effort is normal. No respiratory distress.      Breath sounds: Normal breath sounds. No wheezing, rhonchi or rales.  Abdominal:     General: Abdomen is flat. Bowel sounds are normal.     Tenderness: There is abdominal tenderness in the right upper quadrant, epigastric area and left upper quadrant. There is guarding.     Hernia: A hernia is present. Hernia is present in the ventral area.     Comments: Large Ventral hernia on exam  Musculoskeletal:     Cervical back: Normal range of motion and neck supple.  Skin:    General: Skin is warm and dry.     Capillary Refill: Capillary refill takes less than 2 seconds.  Neurological:     General: No focal deficit present.     Mental Status: He is alert and oriented  to person, place, and time.  Psychiatric:        Mood and Affect: Mood normal.        Behavior: Behavior normal.        Thought Content: Thought content normal.        Judgment: Judgment normal.     Results for orders placed or performed in visit on 05/15/24  Comprehensive metabolic panel with GFR   Collection Time: 05/15/24  9:13 AM  Result Value Ref Range   Glucose 107 (H) 70 - 99 mg/dL   BUN 14 8 - 27 mg/dL   Creatinine, Ser 8.96 0.76 - 1.27 mg/dL   eGFR 74 >40 fO/fpw/8.26   BUN/Creatinine Ratio 14 10 - 24   Sodium 139 134 - 144 mmol/L   Potassium 4.1 3.5 - 5.2 mmol/L   Chloride 102 96 - 106 mmol/L   CO2 19 (L) 20 - 29 mmol/L   Calcium  9.2 8.6 - 10.2 mg/dL   Total Protein 6.8 6.0 - 8.5 g/dL   Albumin 4.3 3.8 - 4.8 g/dL   Globulin, Total 2.5 1.5 - 4.5 g/dL   Bilirubin Total 0.4 0.0 - 1.2 mg/dL   Alkaline Phosphatase 64 44 - 121 IU/L   AST 17 0 - 40 IU/L   ALT 19 0 - 44 IU/L  Hemoglobin A1c   Collection Time: 05/15/24  9:13 AM  Result Value Ref Range   Hgb A1c MFr Bld 5.6 4.8 - 5.6 %   Est. average glucose Bld gHb Est-mCnc 114 mg/dL  Lipid panel   Collection Time: 05/15/24  9:13 AM  Result Value Ref Range   Cholesterol, Total 115 100 - 199 mg/dL   Triglycerides 818 (H) 0 - 149 mg/dL   HDL 28 (L) >60 mg/dL    VLDL Cholesterol Cal 30 5 - 40 mg/dL   LDL Chol Calc (NIH) 57 0 - 99 mg/dL   Chol/HDL Ratio 4.1 0.0 - 5.0 ratio      Assessment & Plan:   Problem List Items Addressed This Visit       Cardiovascular and Mediastinum   Hypertension associated with diabetes (HCC)   Chronic.  Elevated at visit today likely due to abdominal pain.  Continue with current medication regimen of telmisartan  and carvedilol .  Will adjust medications if needed in the future.  Labs ordered today. Will make recommendations based on lab results. Recommend checking blood pressures at home and bringing log to next visit.      Relevant Orders   Comprehensive metabolic panel with GFR   Hemoglobin A1c   Coronary artery disease involving native coronary artery of native heart with unstable angina pectoris (HCC)   Chronic.  Continue with Statin therapy and ASA.        Respiratory   COPD with chronic bronchitis and emphysema (HCC) - Primary   Chronic.  Cough improved some.  Continue with Breztri .  Continue to follow up with Pulmonology.  Note reviewed.        Endocrine   Type 2 diabetes mellitus with peripheral neuropathy (HCC)   Chronic.  Controlled.  No longer taking Gabapentin .  Continue with Trulicity .  Labs ordered today.  A1c has been well controlled at 5.7%.  Eye exam up to date.  Return to clinic in 3 months for reevaluation.  Call sooner if concerns arise.       Hyperlipidemia associated with type 2 diabetes mellitus (HCC)   Chronic. Controlled.  Continue with current medication regimen of Atorvastatin  40mg  daily.  Return in 3 months.  Call sooner if concerns arise.      Relevant Orders   Lipid panel   Diabetes mellitus treated with injections of non-insulin medication (HCC)     Other   Abdominal pain of multiple sites   Abdominal pain x 2 weeks. Large ventral hernia, guarding, adominal distention on exam. Unable to evaluate for Murphy's sign due to pain. Also having diarrhea. Will rule out strangulated  hernia.      Relevant Orders   CBC w/Diff   CT ABDOMEN PELVIS W CONTRAST      Follow up plan: Return in about 3 months (around 11/13/2024) for HTN, HLD, DM2 FU.

## 2024-08-13 NOTE — Assessment & Plan Note (Signed)
 Chronic.  Elevated at visit today likely due to abdominal pain.  Continue with current medication regimen of telmisartan  and carvedilol .  Will adjust medications if needed in the future.  Labs ordered today. Will make recommendations based on lab results. Recommend checking blood pressures at home and bringing log to next visit.

## 2024-08-13 NOTE — Assessment & Plan Note (Signed)
 Chronic.  Controlled.  No longer taking Gabapentin .  Continue with Trulicity .  Labs ordered today.  A1c has been well controlled at 5.7%.  Eye exam up to date.  Return to clinic in 3 months for reevaluation.  Call sooner if concerns arise.

## 2024-08-14 LAB — COMPREHENSIVE METABOLIC PANEL WITH GFR
ALT: 20 IU/L (ref 0–44)
AST: 14 IU/L (ref 0–40)
Albumin: 4.2 g/dL (ref 3.8–4.8)
Alkaline Phosphatase: 66 IU/L (ref 47–123)
BUN/Creatinine Ratio: 16 (ref 10–24)
BUN: 16 mg/dL (ref 8–27)
Bilirubin Total: 0.4 mg/dL (ref 0.0–1.2)
CO2: 22 mmol/L (ref 20–29)
Calcium: 9.3 mg/dL (ref 8.6–10.2)
Chloride: 102 mmol/L (ref 96–106)
Creatinine, Ser: 1.01 mg/dL (ref 0.76–1.27)
Globulin, Total: 2.5 g/dL (ref 1.5–4.5)
Glucose: 99 mg/dL (ref 70–99)
Potassium: 3.9 mmol/L (ref 3.5–5.2)
Sodium: 140 mmol/L (ref 134–144)
Total Protein: 6.7 g/dL (ref 6.0–8.5)
eGFR: 76 mL/min/1.73 (ref >59)

## 2024-08-14 LAB — CBC WITH DIFFERENTIAL/PLATELET
Basophils Absolute: 0 x10E3/uL (ref 0.0–0.2)
Basos: 0 %
EOS (ABSOLUTE): 0.1 x10E3/uL (ref 0.0–0.4)
Eos: 2 %
Hematocrit: 41.3 % (ref 37.5–51.0)
Hemoglobin: 13.5 g/dL (ref 13.0–17.7)
Immature Grans (Abs): 0 x10E3/uL (ref 0.0–0.1)
Immature Granulocytes: 0 %
Lymphocytes Absolute: 1.9 x10E3/uL (ref 0.7–3.1)
Lymphs: 29 %
MCH: 30.4 pg (ref 26.6–33.0)
MCHC: 32.7 g/dL (ref 31.5–35.7)
MCV: 93 fL (ref 79–97)
Monocytes Absolute: 0.5 x10E3/uL (ref 0.1–0.9)
Monocytes: 8 %
Neutrophils Absolute: 4.1 x10E3/uL (ref 1.4–7.0)
Neutrophils: 61 %
Platelets: 233 x10E3/uL (ref 150–450)
RBC: 4.44 x10E6/uL (ref 4.14–5.80)
RDW: 13.5 % (ref 11.6–15.4)
WBC: 6.7 x10E3/uL (ref 3.4–10.8)

## 2024-08-14 LAB — LIPID PANEL
Chol/HDL Ratio: 3.6 ratio (ref 0.0–5.0)
Cholesterol, Total: 109 mg/dL (ref 100–199)
HDL: 30 mg/dL — ABNORMAL LOW (ref >39)
LDL Chol Calc (NIH): 52 mg/dL (ref 0–99)
Triglycerides: 158 mg/dL — ABNORMAL HIGH (ref 0–149)
VLDL Cholesterol Cal: 27 mg/dL (ref 5–40)

## 2024-08-14 LAB — HEMOGLOBIN A1C
Est. average glucose Bld gHb Est-mCnc: 103 mg/dL
Hgb A1c MFr Bld: 5.2 % (ref 4.8–5.6)

## 2024-08-17 ENCOUNTER — Ambulatory Visit (INDEPENDENT_AMBULATORY_CARE_PROVIDER_SITE_OTHER): Admitting: Nurse Practitioner

## 2024-08-17 ENCOUNTER — Encounter: Payer: Self-pay | Admitting: Nurse Practitioner

## 2024-08-17 VITALS — BP 164/74 | HR 57 | Temp 99.6°F | Ht 70.0 in | Wt 198.8 lb

## 2024-08-17 DIAGNOSIS — Z0000 Encounter for general adult medical examination without abnormal findings: Secondary | ICD-10-CM

## 2024-08-17 DIAGNOSIS — R1085 Abdominal pain of multiple sites: Secondary | ICD-10-CM | POA: Diagnosis not present

## 2024-08-17 DIAGNOSIS — Z7189 Other specified counseling: Secondary | ICD-10-CM

## 2024-08-17 NOTE — Progress Notes (Signed)
 Subjective:   Juan Le. is a 78 y.o. male who presents for Medicare Annual/Subsequent preventive examination.  Visit Complete: In person  Patient Medicare AWV questionnaire was completed by the patient on 08/17/24; I have confirmed that all information answered by patient is correct and no changes since this date.  Cardiac Risk Factors include: advanced age (>67men, >53 women);diabetes mellitus;male gender;sedentary lifestyle     Objective:    Today's Vitals   08/17/24 0900 08/17/24 0905  BP:  (!) 153/78  Pulse:  (!) 59  Temp:  99.6 F (37.6 C)  TempSrc:  Oral  SpO2:  98%  Weight: 198 lb 12.8 oz (90.2 kg) 198 lb 12.8 oz (90.2 kg)  Height: 5' 10 (1.778 m) 5' 10 (1.778 m)  PainSc:  7    Body mass index is 28.52 kg/m.     08/17/2024    9:08 AM 12/31/2022   11:12 AM 07/21/2022    9:35 AM 06/29/2022    7:54 AM 07/15/2021    5:49 PM 12/17/2019    8:52 AM 12/13/2018    9:37 AM  Advanced Directives  Does Patient Have a Medical Advance Directive? Yes Yes Yes Yes Yes Yes Yes   Type of Estate Agent of [place];Living will Living will Living will Healthcare Power of [place];Living will Healthcare Power of [place];Living will Healthcare Power of [place];Living will Healthcare Power of [place];Living will  Does patient want to make changes to medical advance directive? No - Patient declined No - Patient declined   No - Patient declined    Copy of Healthcare Power of Attorney in Chart? No - copy requested  No - copy requested No - copy requested No - copy requested No - copy requested No - copy requested      Data saved with a previous flowsheet row definition    Current Medications (verified) Outpatient Encounter Medications as of 08/17/2024  Medication Sig   aspirin  81 MG tablet Take 1 tablet by mouth daily.   atorvastatin  (LIPITOR) 40 MG tablet TAKE 1 TABLET BY MOUTH DAILY   budesonide-glycopyrrolate-formoterol (BREZTRI  AEROSPHERE) 160-9-4.8  MCG/ACT AERO inhaler Inhale 2 puffs into the lungs in the morning and at bedtime.   carvedilol  (COREG ) 6.25 MG tablet Take 1 tablet (6.25 mg total) by mouth 2 (two) times daily.   dicyclomine (BENTYL) 10 MG capsule Take 1 capsule (10 mg total) by mouth 4 (four) times daily -  before meals and at bedtime.   Dulaglutide  (TRULICITY ) 0.75 MG/0.5ML SOAJ Inject 0.75 mg into the skin once a week.   metFORMIN  (GLUCOPHAGE ) 1000 MG tablet TAKE 1 TABLET BY MOUTH TWICE  DAILY WITH MEALS   pantoprazole (PROTONIX) 40 MG tablet Take 1 tablet (40 mg total) by mouth daily.   QUEtiapine  (SEROQUEL ) 200 MG tablet TAKE 1 TABLET BY MOUTH AT  BEDTIME   telmisartan  (MICARDIS ) 40 MG tablet TAKE 1 TABLET BY MOUTH DAILY   No facility-administered encounter medications on file as of 08/17/2024.    Allergies (verified) Ace inhibitors and Diphenhydramine   History: Past Medical History:  Diagnosis Date   Anxiety    Atelectasis of left lung 03/25/2017   Chronic, continuous use of opioids    COPD (chronic obstructive pulmonary disease) (HCC)    Coronary artery disease    a. 1997 s/p CABG x 4 (LIMA->LAD, VG->OM2, VG->D2, VG->RPDA); b. 2008 Cath/PCI: VG->D1 90ost (s/p DES). Other grafts patent; c. 12/2022 MV: antlat/inflat/inf ischemia/infarct; d. 12/2022 Cath: LM diff dzs, LAD 100ost, LCX  ok, OM2 100, OM branches fill via R->L collats from RV marginal branch,RCA 60p, 100d, RPL3 fills via collats from OM3, VG->OM2 100, VG->D2 100, VG->RPDA 100, LIMA->LAD ok-->Med Rx.   Degenerative disc disease, lumbar    Diabetes (HCC)    Diastolic dysfunction    a. 01/2023 Echo: EF 55-60%, no rwma, GrI DD, nl RV fxn, mild MR, Ao sclerosis.   DJD (degenerative joint disease)    GERD (gastroesophageal reflux disease)    Heart attack (HCC)    History of diverticulitis    Hypertension    Hypertriglyceridemia    Hypokalemia    Insomnia    Mitral regurgitation    Sleep apnea    Vitamin D  deficiency    Wears dentures    full upper    Past Surgical History:  Procedure Laterality Date   COLONOSCOPY WITH PROPOFOL  N/A 01/28/2017   Procedure: COLONOSCOPY WITH PROPOFOL ;  Surgeon: Rogelia Copping, MD;  Location: [facility];  Service: Endoscopy;  Laterality: N/A;  diabetic - oral meds   COLONOSCOPY WITH PROPOFOL  N/A 06/29/2022   Procedure: COLONOSCOPY WITH PROPOFOL ;  Surgeon: Copping Rogelia, MD;  Location: [facility] END[facility]COPY;  Service: Endoscopy;  Laterality: N/A;   CORONARY ARTERY BYPASS GRAFT  1997   CORONARY STENT PLACEMENT  11/2006   FOOT SURGERY  2003   HERNIA REPAIR     LEFT HEART CATH AND CORONARY ANGIOGRAPHY Left 12/31/2022   Procedure: LEFT HEART CATH AND CORONARY ANGIOGRAPHY;  Surgeon: Darron Deatrice LABOR, MD;  Location: [facility] INVASIVE CV LAB;  Service: Cardiovascular;  Laterality: Left;   POLYPECTOMY N/A 01/28/2017   Procedure: POLYPECTOMY;  Surgeon: Rogelia Copping, MD;  Location: [facility];  Service: Endoscopy;  Laterality: N/A;   UMBILICAL HERNIA REPAIR  04/2012   Family History  Problem Relation Age of Onset   Throat cancer Father    Colon cancer Father    Heart disease Father    Cancer Father    Depression Father    Hypertension Father    Parkinson's disease Father    Arthritis Brother    Stroke Mother    Kidney disease Mother    Heart disease Mother    Kidney failure Mother    Breast cancer Paternal Aunt    Depression Sister    Stroke Maternal Grandmother    Bone cancer Brother    Stroke Brother    Prostate cancer Neg Hx    Kidney cancer Neg Hx    Bladder Cancer Neg Hx    Social History   Socioeconomic History   Marital status: Married    Spouse name: Not on file   Number of children: 2   Years of education: 12   Highest education level: High school graduate  Occupational History   Occupation: Retired    Comment: formerly worked at [employer])  Tobacco Use   Smoking status: Former    Current packs/day: 0.00    Types: Cigarettes    Quit date: 10/19/1955    Years since  quitting: 68.8    Passive exposure: Past   Smokeless tobacco: Never   Tobacco comments:    former light smoker; quit in his teens  Vaping Use   Vaping status: Never Used  Substance and Sexual Activity   Alcohol  use: No    Alcohol /week: 0.0 standard drinks of alcohol    Drug use: No   Sexual activity: Yes    Partners: Female  Other Topics Concern   Not on file  Social History Narrative  Not on file   Social Drivers of Health   Financial Resource Strain: Low Risk  (08/17/2024)   Overall Financial Resource Strain (CARDIA)    Difficulty of Paying Living Expenses: Not hard at all  Food Insecurity: No Food Insecurity (08/17/2024)   Hunger Vital Sign    Worried About Running Out of Food in the Last Year: Never true    Ran Out of Food in the Last Year: Never true  Transportation Needs: No Transportation Needs (08/17/2024)   PRAPARE - Administrator, Civil Service (Medical): No    Lack of Transportation (Non-Medical): No  Physical Activity: Inactive (08/17/2024)   Exercise Vital Sign    Days of Exercise per Week: 0 days    Minutes of Exercise per Session: 0 min  Stress: No Stress Concern Present (08/17/2024)   [employer] of Occupational Health - Occupational Stress Questionnaire    Feeling of Stress: Not at all  Social Connections: Moderately Isolated (08/17/2024)   Social Connection and Isolation Panel    Frequency of Communication with Friends and Family: Three times a week    Frequency of Social Gatherings with Friends and Family: Never    Attends Religious Services: Never    Database Administrator or Organizations: No    Attends Engineer, Structural: Never    Marital Status: Married    Tobacco Counseling Counseling given: Not Answered Tobacco comments: former light smoker; quit in his teens   Clinical Intake:  Pre-visit preparation completed: Yes  Pain : 0-10 Pain Score: 7  Pain Type: Chronic pain Pain Onset: More than a month ago      BMI - recorded: 28.52 Nutritional Status: BMI 25 -29 Overweight Nutritional Risks: None Diabetes: Yes CBG done?: No Did pt. bring in CBG monitor from home?: No  How often do you need to have someone help you when you read instructions, pamphlets, or other written materials from your doctor or pharmacy?: 1 - Never What is the last grade level you completed in school?: GED  Interpreter Needed?: No      Activities of Daily Living    08/17/2024    9:07 AM  In your present state of health, do you have any difficulty performing the following activities:  Hearing? 0  Vision? 0  Difficulty concentrating or making decisions? 1  Walking or climbing stairs? 0  Dressing or bathing? 0  Doing errands, shopping? 0  Preparing Food and eating ? N  Using the Toilet? N  In the past six months, have you accidently leaked urine? N  Do you have problems with loss of bowel control? N  Managing your Medications? N  Managing your Finances? N  Housekeeping or managing your Housekeeping? N    Patient Care Team: Melvin Pao, NP as PCP - General Darron Deatrice LABOR, MD as PCP - Cardiology (Cardiology) Mevelyn JONETTA Bathe, OD as Consulting Physician (Optometry) Helon, Clotilda LABOR RIGGERS as Physician Assistant (Urology) Verdia Art, MD as Consulting Physician (Pulmonary Disease) Valora Mliss BRAVO, [facility] (Inactive) (Pharmacist) Nyle Rankin POUR, [facility] (Inactive) (Pharmacist)  Indicate any recent Medical Services you may have received from other than [facility] providers in the past year (date may be approximate).     Assessment:   This is a routine wellness examination for Juan Le.  Hearing/Vision screen No results found.   Goals Addressed   None    Depression Screen    08/17/2024    9:16 AM 05/15/2024    8:53 AM 03/05/2024  2:50 PM 02/08/2024    8:42 AM 01/27/2023   10:45 AM 10/28/2022   10:03 AM 07/21/2022    9:41 AM  PHQ 2/9 Scores  PHQ - 2 Score 0 0 0 0 4 0 0  PHQ- 9 Score 0 0  0 0 16  3    Fall Risk    08/17/2024    9:16 AM 05/15/2024    8:53 AM 03/05/2024    2:50 PM 02/08/2024    8:42 AM 08/10/2023    1:17 PM  Fall Risk   Falls in the past year? 0 0 0 0 0  Number falls in past yr: 0 0 0 0   Injury with Fall? 0 0 0 0 0  Risk for fall due to : No Fall Risks No Fall Risks No Fall Risks No Fall Risks   Follow up Falls evaluation completed Falls evaluation completed Falls evaluation completed Falls evaluation completed     MEDICARE RISK AT HOME: Medicare Risk at Home Any stairs in or around the home?: Yes If so, are there any without handrails?: No Home free of loose throw rugs in walkways, pet beds, electrical cords, etc?: Yes Adequate lighting in your home to reduce risk of falls?: Yes Life alert?: No Use of a cane, walker or w/c?: No Grab bars in the bathroom?: No Shower chair or bench in shower?: No Elevated toilet seat or a handicapped toilet?: No  TIMED UP AND GO:  Was the test performed?  Yes  Length of time to ambulate 10 feet: 6 sec Gait steady and fast without use of assistive device    Cognitive Function:        08/17/2024    9:11 AM 08/10/2023    1:18 PM 07/21/2022    9:37 AM 07/15/2021    5:51 PM 12/13/2018    9:44 AM  6CIT Screen  What Year? 0 points 0 points 0 points 0 points 0 points  What month? 0 points 0 points 0 points 0 points 0 points  What time? 3 points 3 points 0 points 0 points 0 points  Count back from 20 0 points 0 points 0 points 0 points 0 points  Months in reverse 0 points 4 points 4 points 0 points 0 points  Repeat phrase 0 points 0 points 0 points 0 points 0 points  Total Score 3 points 7 points 4 points 0 points 0 points    Immunizations Immunization History  Administered Date(s) Administered   Fluad Quad(high Dose 65+) 06/11/2019, 08/10/2021, 07/14/2022, 08/02/2024   Fluad Trivalent(High Dose 65+) 07/19/2023   INFLUENZA, HIGH DOSE SEASONAL PF 07/17/2015, 07/29/2016, 07/15/2017, 07/01/2023   Influenza,  Seasonal, Injecte, Preservative Fre 08/14/2008   Influenza,inj,Quad PF,6+ Mos 07/20/2013, 06/20/2014   Influenza,inj,quad, With Preservative 08/18/2018   Influenza-Unspecified 08/18/2018, 08/07/2020   Moderna Covid-19 Fall Seasonal Vaccine 20yrs & older 07/28/2022, 07/01/2023, 07/19/2023   Moderna Sars-Covid-2 Vaccination 11/28/2019, 12/26/2019, 09/17/2020, 10/23/2020   Pneumococcal Conjugate-13 08/15/2014   Pneumococcal Polysaccharide-23 05/11/2012   Tdap 05/29/2014   Zoster Recombinant(Shingrix) 08/10/2022, 11/10/2022    TDAP status: Due, Education has been provided regarding the importance of this vaccine. Advised may receive this vaccine at local pharmacy or Health Dept. Aware to provide a copy of the vaccination record if obtained from local pharmacy or Health Dept. Verbalized acceptance and understanding.  Flu Vaccine status: Up to date  Pneumococcal vaccine status: Up to date  Covid-19 vaccine status: Completed vaccines  Qualifies for Shingles Vaccine? Yes   Zostavax completed  Yes   Shingrix Completed?: Yes  Screening Tests Health Maintenance  Topic Date Due   COVID-19 Vaccine (7 - 2025-26 season) 08/29/2024 (Originally 06/18/2024)   DTaP/Tdap/Td (2 - Td or Tdap) 08/13/2025 (Originally 05/29/2024)   Diabetic kidney evaluation - Urine ACR  02/07/2025   HEMOGLOBIN A1C  02/11/2025   OPHTHALMOLOGY EXAM  02/15/2025   Diabetic kidney evaluation - eGFR measurement  08/13/2025   FOOT EXAM  08/13/2025   Medicare Annual Wellness (AWV)  08/17/2025   Colonoscopy  06/30/2027   Pneumococcal Vaccine: 50+ Years  Completed   Influenza Vaccine  Completed   Hepatitis C Screening  Completed   Zoster Vaccines- Shingrix  Completed   Meningococcal B Vaccine  Aged Out    Health Maintenance  There are no preventive care reminders to display for this patient.   Colorectal cancer screening: Type of screening: Colonoscopy. Completed 06/29/22. Repeat every 5 years  Lung Cancer Screening: (Low  Dose CT Chest recommended if Age 54-80 years, 20 pack-year currently smoking OR have quit w/in 15years.) does not qualify.   Lung Cancer Screening Referral: N/A  Additional Screening:  Hepatitis C Screening: does qualify; Completed 01/17/17  Vision Screening: Recommended annual ophthalmology exams for early detection of glaucoma and other disorders of the eye. Is the patient up to date with their annual eye exam?  Yes  Who is the provider or what is the name of the office in which the patient attends annual eye exams? Dr. Mevelyn If pt is not established with a provider, would they like to be referred to a provider to establish care? N/A.   Dental Screening: Recommended annual dental exams for proper oral hygiene  Diabetic Foot Exam: Diabetic Foot Exam: Completed 08/13/24  Community Resource Referral / Chronic Care Management: CRR required this visit?  No   CCM required this visit?  No     Plan:     I have personally reviewed and noted the following in the patient's chart:   Medical and social history Use of alcohol , tobacco or illicit drugs  Current medications and supplements including opioid prescriptions. Patient is not currently taking opioid prescriptions. Functional ability and status Nutritional status Physical activity Advanced directives List of other physicians Hospitalizations, surgeries, and ER visits in previous 12 months Vitals Screenings to include cognitive, depression, and falls Referrals and appointments  In addition, I have reviewed and discussed with patient certain preventive protocols, quality metrics, and best practice recommendations. A written personalized care plan for preventive services as well as general preventive health recommendations were provided to patient.     Juan Le, CMA   08/17/2024   After Visit Summary: (In Person-Printed) AVS printed and given to the patient

## 2024-08-17 NOTE — Assessment & Plan Note (Signed)
 Ongoing.  CT unremarkable for symptoms. Continue to hold Trulicity .  Continue with Pantoprazole and hold Omeprazole .  Use Dicyclomine as needed for pain.  Follow up in 2 weeks.

## 2024-08-17 NOTE — Assessment & Plan Note (Signed)
 A voluntary discussion about advance care planning including the explanation and discussion of advance directives was extensively discussed  with the patient for 5 minutes with patient and his wife present.  Explanation about the health care proxy and Living will was reviewed and packet with forms with explanation of how to fill them out was given.  During this discussion, the patient was able to identify a health care proxy as his wife and plans to fill out the paperwork required.  Patient was offered a separate Advance Care Planning visit for further assistance with forms.

## 2024-08-17 NOTE — Progress Notes (Signed)
 BP (!) 164/74 (BP Location: Right Arm, Cuff Size: Normal)   Pulse (!) 57   Temp 99.6 F (37.6 C) (Oral)   Ht 5' 10 (1.778 m)   Wt 198 lb 12.8 oz (90.2 kg)   SpO2 98%   BMI 28.52 kg/m    Subjective:    Patient ID: Juan Le., male    DOB: December 19, 1945, 78 y.o.   MRN: [id number]  HPI: Juan Le. is a 78 y.o. male  Chief Complaint  Patient presents with   Abdominal Pain    4 day follow up   Medicare Wellness   ABDOMINAL ISSUES His weight is controlled.  CT scan was unremarkable.  Started Pantoprazole and stopped Omeprazole .  Also taking Dicyclomine without improvement in symptoms.  Pain starts on the left side and works its way across his chest. He took his Trulicity  10 days ago.   Duration: at least 2 weeks Nature: sharp and burning Location: epigastric  Severity: 10/10  Radiation: no Episode duration: Frequency: constant but gets more intense Alleviating factors: nothing Aggravating factors: nothing Treatments attempted: none Constipation: no Diarrhea: yes Episodes of diarrhea/day: Mucous in the stool: no Heartburn: yes Bloating:yes Flatulence: no Nausea: no Vomiting: no Episodes of vomit/day: Melena or hematochezia: no Rash: no Jaundice: no Fever: no Weight loss: no   Relevant past medical, surgical, family and social history reviewed and updated as indicated. Interim medical history since our last visit reviewed. Allergies and medications reviewed and updated.  Review of Systems  Constitutional:  Negative for unexpected weight change.  Gastrointestinal:  Positive for abdominal pain. Negative for constipation, diarrhea, nausea and vomiting.    Per HPI unless specifically indicated above     Objective:    BP (!) 164/74 (BP Location: Right Arm, Cuff Size: Normal)   Pulse (!) 57   Temp 99.6 F (37.6 C) (Oral)   Ht 5' 10 (1.778 m)   Wt 198 lb 12.8 oz (90.2 kg)   SpO2 98%   BMI 28.52 kg/m   Wt Readings from Last 3 Encounters:   08/17/24 198 lb 12.8 oz (90.2 kg)  08/13/24 199 lb 12.8 oz (90.6 kg)  07/10/24 204 lb 12.8 oz (92.9 kg)    Physical Exam Vitals and nursing note reviewed.  Constitutional:      General: He is not in acute distress.    Appearance: Normal appearance. He is not ill-appearing, toxic-appearing or diaphoretic.  HENT:     Head: Normocephalic.     Right Ear: External ear normal.     Left Ear: External ear normal.     Nose: Nose normal. No congestion or rhinorrhea.     Mouth/Throat:     Mouth: Mucous membranes are moist.  Eyes:     General:        Right eye: No discharge.        Left eye: No discharge.     Extraocular Movements: Extraocular movements intact.     Conjunctiva/sclera: Conjunctivae normal.     Pupils: Pupils are equal, round, and reactive to light.  Cardiovascular:     Rate and Rhythm: Normal rate and regular rhythm.     Heart sounds: No murmur heard. Pulmonary:     Effort: Pulmonary effort is normal. No respiratory distress.     Breath sounds: Normal breath sounds. No wheezing, rhonchi or rales.  Abdominal:     General: Abdomen is flat. Bowel sounds are normal.     Tenderness: There is abdominal tenderness  in the right upper quadrant, right lower quadrant, left upper quadrant and left lower quadrant.  Musculoskeletal:     Cervical back: Normal range of motion and neck supple.  Skin:    General: Skin is warm and dry.     Capillary Refill: Capillary refill takes less than 2 seconds.  Neurological:     General: No focal deficit present.     Mental Status: He is alert and oriented to person, place, and time.  Psychiatric:        Mood and Affect: Mood normal.        Behavior: Behavior normal.        Thought Content: Thought content normal.        Judgment: Judgment normal.     Results for orders placed or performed during the hospital encounter of 08/13/24  I-STAT creatinine   Collection Time: 08/13/24 12:18 PM  Result Value Ref Range   Creatinine, Ser 1.10 0.61  - 1.24 mg/dL      Assessment & Plan:   Problem List Items Addressed This Visit       Other   Advanced care planning/counseling discussion   A voluntary discussion about advance care planning including the explanation and discussion of advance directives was extensively discussed  with the patient for 5 minutes with patient and his wife present.  Explanation about the health care proxy and Living will was reviewed and packet with forms with explanation of how to fill them out was given.  During this discussion, the patient was able to identify a health care proxy as his wife and plans to fill out the paperwork required.  Patient was offered a separate Advance Care Planning visit for further assistance with forms.         Abdominal pain of multiple sites   Ongoing.  CT unremarkable for symptoms. Continue to hold Trulicity .  Continue with Pantoprazole and hold Omeprazole .  Use Dicyclomine as needed for pain.  Follow up in 2 weeks.      Other Visit Diagnoses       Encounter for Medicare annual wellness exam    -  Primary        Follow up plan: Return in about 2 weeks (around 08/31/2024) for Abdominal pain .

## 2024-08-31 ENCOUNTER — Ambulatory Visit: Admitting: Nurse Practitioner

## 2024-08-31 NOTE — Progress Notes (Deleted)
   There were no vitals taken for this visit.   Subjective:    Patient ID: Juan DELENA Fern Mickey., male    DOB: 1945-10-27, 78 y.o.   MRN: [id number]  HPI: Juan Swindle. is a 78 y.o. male  No chief complaint on file.  ABDOMINAL ISSUES His weight is controlled.  CT scan was unremarkable.  Started Pantoprazole and stopped Omeprazole .  Also taking Dicyclomine without improvement in symptoms.  Pain starts on the left side and works its way across his chest. He took his Trulicity  10 days ago.   Duration: at least 2 weeks Nature: sharp and burning Location: epigastric  Severity: 10/10  Radiation: no Episode duration: Frequency: constant but gets more intense Alleviating factors: nothing Aggravating factors: nothing Treatments attempted: none Constipation: no Diarrhea: yes Episodes of diarrhea/day: Mucous in the stool: no Heartburn: yes Bloating:yes Flatulence: no Nausea: no Vomiting: no Episodes of vomit/day: Melena or hematochezia: no Rash: no Jaundice: no Fever: no Weight loss: no   Relevant past medical, surgical, family and social history reviewed and updated as indicated. Interim medical history since our last visit reviewed. Allergies and medications reviewed and updated.  Review of Systems  Per HPI unless specifically indicated above     Objective:    There were no vitals taken for this visit.  Wt Readings from Last 3 Encounters:  08/17/24 198 lb 12.8 oz (90.2 kg)  08/13/24 199 lb 12.8 oz (90.6 kg)  07/10/24 204 lb 12.8 oz (92.9 kg)    Physical Exam  Results for orders placed or performed during the hospital encounter of 08/13/24  I-STAT creatinine   Collection Time: 08/13/24 12:18 PM  Result Value Ref Range   Creatinine, Ser 1.10 0.61 - 1.24 mg/dL      Assessment & Plan:   Problem List Items Addressed This Visit   None    Follow up plan: No follow-ups on file.

## 2024-09-04 ENCOUNTER — Other Ambulatory Visit: Payer: Self-pay | Admitting: Nurse Practitioner

## 2024-09-04 DIAGNOSIS — E1142 Type 2 diabetes mellitus with diabetic polyneuropathy: Secondary | ICD-10-CM

## 2024-09-07 NOTE — Telephone Encounter (Signed)
 Requested medication (s) are due for refill today: yes  Requested medication (s) are on the active medication list: yes  Last refill:  09/26/23  Future visit scheduled: yes  Notes to clinic:  Unable to refill per protocol, cannot delegate.       Requested Prescriptions  Pending Prescriptions Disp Refills   QUEtiapine  (SEROQUEL ) 200 MG tablet [Pharmacy Med Name: QUETIAPINE   200MG   TAB] 90 tablet 3    Sig: TAKE 1 TABLET BY MOUTH EVERY  NIGHT AT BEDTIME     Not Delegated - Psychiatry:  Antipsychotics - Second Generation (Atypical) - quetiapine  Failed - 09/07/2024 11:57 AM      Failed - This refill cannot be delegated      Failed - TSH in normal range and within 360 days    TSH  Date Value Ref Range Status  05/27/2022 8.240 (H) 0.450 - 4.500 uIU/mL Final         Failed - Last BP in normal range    BP Readings from Last 1 Encounters:  08/17/24 (!) 164/74         Failed - Lipid Panel in normal range within the last 12 months    Cholesterol, Total  Date Value Ref Range Status  08/13/2024 109 100 - 199 mg/dL Final   LDL Cholesterol (Calc)  Date Value Ref Range Status  11/29/2017 68 mg/dL (calc) Final    Comment:    Reference range: <100 . Desirable range <100 mg/dL for primary prevention;   <70 mg/dL for patients with CHD or diabetic patients  with > or = 2 CHD risk factors. SABRA LDL-C is now calculated using the Martin-Hopkins  calculation, which is a validated novel method providing  better accuracy than the Friedewald equation in the  estimation of LDL-C.  Juan Le et al. Juan Le. 7986;689(80): 2061-2068  (http://education.[facility].com/faq/FAQ164)    LDL Chol Calc (NIH)  Date Value Ref Range Status  08/13/2024 52 0 - 99 mg/dL Final   HDL  Date Value Ref Range Status  08/13/2024 30 (L) >39 mg/dL Final   Triglycerides  Date Value Ref Range Status  08/13/2024 158 (H) 0 - 149 mg/dL Final         Passed - Last Heart Rate in normal range    Pulse  Readings from Last 1 Encounters:  08/17/24 (!) 57         Passed - Valid encounter within last 6 months    Recent Outpatient Visits           3 weeks ago Encounter for [employer] annual wellness exam   [place] [facility] [place], Darice, NP   3 weeks ago COPD with chronic bronchitis and emphysema (HCC)   [place] [facility] Melvin Darice, NP   3 months ago Type 2 diabetes mellitus with peripheral neuropathy [facility])   [place] [facility] Melvin Darice, NP   6 months ago COPD with chronic bronchitis and emphysema (HCC)   [place] [facility] Melvin Darice, NP   7 months ago Type 2 diabetes mellitus with peripheral neuropathy [facility])   [place] [facility] Melvin Darice, NP       Future Appointments             In 5 days Vivienne Lonni Ingle, NP [facility] [facility] - CBC within normal limits and completed in the last 12 months  WBC  Date Value Ref Range Status  08/13/2024 6.7 3.4 - 10.8 x10E3/uL Final  12/29/2022 11.2 (H) 4.0 - 10.5 K/uL Final   RBC  Date Value Ref Range Status  08/13/2024 4.44 4.14 - 5.80 x10E6/uL Final  12/29/2022 5.03 4.22 - 5.81 MIL/uL Final   Hemoglobin  Date Value Ref Range Status  08/13/2024 13.5 13.0 - 17.7 g/dL Final   Hematocrit  Date Value Ref Range Status  08/13/2024 41.3 37.5 - 51.0 % Final   MCHC  Date Value Ref Range Status  08/13/2024 32.7 31.5 - 35.7 g/dL Final  96/86/7975 66.0 30.0 - 36.0 g/dL Final   [facility]  Date Value Ref Range Status  08/13/2024 30.4 26.6 - 33.0 pg Final  12/29/2022 30.2 26.0 - 34.0 pg Final   MCV  Date Value Ref Range Status  08/13/2024 93 79 - 97 fL Final   No results found for: PLTCOUNTKUC, LABPLAT, POCPLA RDW  Date Value Ref Range Status  08/13/2024 13.5 11.6 - 15.4 % Final         Passed - CMP within normal limits and completed in the last 12 months     Albumin  Date Value Ref Range Status  08/13/2024 4.2 3.8 - 4.8 g/dL Final   Alkaline Phosphatase  Date Value Ref Range Status  08/13/2024 66 47 - 123 IU/L Final   Alkaline phosphatase (APISO)  Date Value Ref Range Status  11/29/2017 55 40 - 115 U/L Final   ALT  Date Value Ref Range Status  08/13/2024 20 0 - 44 IU/L Final   AST  Date Value Ref Range Status  08/13/2024 14 0 - 40 IU/L Final   BUN  Date Value Ref Range Status  08/13/2024 16 8 - 27 mg/dL Final   Calcium   Date Value Ref Range Status  08/13/2024 9.3 8.6 - 10.2 mg/dL Final   CO2  Date Value Ref Range Status  08/13/2024 22 20 - 29 mmol/L Final   Creat  Date Value Ref Range Status  11/29/2017 1.03 0.70 - 1.18 mg/dL Final    Comment:    For patients >61 years of age, the reference limit for Creatinine is approximately 13% higher for people identified as African-American. .    Creatinine, Ser  Date Value Ref Range Status  08/13/2024 1.10 0.61 - 1.24 mg/dL Final   Creatinine, Urine  Date Value Ref Range Status  04/21/2017 206 20 - 370 mg/dL Final   Glucose  Date Value Ref Range Status  08/13/2024 99 70 - 99 mg/dL Final  98/73/7983 896 mg/dL Final   Glucose, Bld  Date Value Ref Range Status  12/29/2022 104 (H) 70 - 99 mg/dL Final    Comment:    Glucose reference range applies only to samples taken after fasting for at least 8 hours.   POC Glucose  Date Value Ref Range Status  03/22/2017 125 (A) 70 - 99 mg/dl Final   Glucose-Capillary  Date Value Ref Range Status  12/31/2022 106 (H) 70 - 99 mg/dL Final    Comment:    Glucose reference range applies only to samples taken after fasting for at least 8 hours.   Potassium  Date Value Ref Range Status  08/13/2024 3.9 3.5 - 5.2 mmol/L Final   Sodium  Date Value Ref Range Status  08/13/2024 140 134 - 144 mmol/L Final   Bilirubin Total  Date Value Ref Range Status  08/13/2024 0.4 0.0 - 1.2 mg/dL Final   Bilirubin, Total  Date Value Ref  Range Status  11/12/2014 0.4 mg/dL Final   Bilirubin, Direct  Date Value Ref Range Status  02/05/2016 0.13 0.00 - 0.40 mg/dL Final   Protein,UA  Date Value Ref Range Status  05/27/2022 Negative Negative/Trace Final   Total Protein  Date Value Ref Range Status  08/13/2024 6.7 6.0 - 8.5 g/dL Final   GFR, Est African American  Date Value Ref Range Status  04/21/2017 86 >=60 mL/min Final   GFR calc Af Amer  Date Value Ref Range Status  12/05/2020 91 >59 mL/min/1.73 Final    Comment:    **In accordance with recommendations from the NKF-ASN Task force,**   [facility] is in the process of updating its eGFR calculation to the   2021 CKD-EPI creatinine equation that estimates kidney function   without a race variable.    eGFR  Date Value Ref Range Status  08/13/2024 76 >59 mL/min/1.73 Final   GFR, Est Non African American  Date Value Ref Range Status  04/21/2017 74 >=60 mL/min Final   GFR, Estimated  Date Value Ref Range Status  12/29/2022 >60 >60 mL/min Final    Comment:    (NOTE) Calculated using the CKD-EPI Creatinine Equation (2021)          Signed Prescriptions Disp Refills   metFORMIN  (GLUCOPHAGE ) 1000 MG tablet 200 tablet 0    Sig: TAKE 1 TABLET BY MOUTH TWICE  DAILY WITH MEALS     Endocrinology:  Diabetes - Biguanides Failed - 09/07/2024 11:57 AM      Failed - B12 Level in normal range and within 720 days    Vitamin B-12  Date Value Ref Range Status  02/08/2024 194 (L) 232 - 1,245 pg/mL Final         Passed - Cr in normal range and within 360 days    Creat  Date Value Ref Range Status  11/29/2017 1.03 0.70 - 1.18 mg/dL Final    Comment:    For patients >19 years of age, the reference limit for Creatinine is approximately 13% higher for people identified as African-American. .    Creatinine, Ser  Date Value Ref Range Status  08/13/2024 1.10 0.61 - 1.24 mg/dL Final   Creatinine, Urine  Date Value Ref Range Status  04/21/2017 206 20 - 370 mg/dL  Final         Passed - HBA1C is between 0 and 7.9 and within 180 days    HB A1C (BAYER DCA - WAIVED)  Date Value Ref Range Status  12/05/2020 6.9 <7.0 % Final    Comment:                                          Diabetic Adult            <7.0                                       Healthy Adult        4.3 - 5.7                                                           (  DCCT/NGSP) American Diabetes Association's Summary of Glycemic Recommendations for Adults with Diabetes: Hemoglobin A1c <7.0%. More stringent glycemic goals (A1c <6.0%) may further reduce complications at the cost of increased risk of hypoglycemia.    Hgb A1c MFr Bld  Date Value Ref Range Status  08/13/2024 5.2 4.8 - 5.6 % Final    Comment:             Prediabetes: 5.7 - 6.4          Diabetes: >6.4          Glycemic control for adults with diabetes: <7.0          Passed - eGFR in normal range and within 360 days    GFR, Est African American  Date Value Ref Range Status  04/21/2017 86 >=60 mL/min Final   GFR calc Af Amer  Date Value Ref Range Status  12/05/2020 91 >59 mL/min/1.73 Final    Comment:    **In accordance with recommendations from the NKF-ASN Task force,**   [facility] is in the process of updating its eGFR calculation to the   2021 CKD-EPI creatinine equation that estimates kidney function   without a race variable.    GFR, Est Non African American  Date Value Ref Range Status  04/21/2017 74 >=60 mL/min Final   GFR, Estimated  Date Value Ref Range Status  12/29/2022 >60 >60 mL/min Final    Comment:    (NOTE) Calculated using the CKD-EPI Creatinine Equation (2021)    eGFR  Date Value Ref Range Status  08/13/2024 76 >59 mL/min/1.73 Final         Passed - Valid encounter within last 6 months    Recent Outpatient Visits           3 weeks ago Encounter for [employer] annual wellness exam   [place] [facility] Melvin Pao, NP   3 weeks ago COPD with chronic  bronchitis and emphysema (HCC)   [place] [facility] [place], Pao, NP   3 months ago Type 2 diabetes mellitus with peripheral neuropathy [facility])   [place] [facility] Melvin Pao, NP   6 months ago COPD with chronic bronchitis and emphysema (HCC)   [place] [facility] Melvin Pao, NP   7 months ago Type 2 diabetes mellitus with peripheral neuropathy [facility])   [place] [facility] Melvin Pao, NP       Future Appointments             In 5 days Vivienne, Lonni Ingle, NP [place] [facility] - CBC within normal limits and completed in the last 12 months    WBC  Date Value Ref Range Status  08/13/2024 6.7 3.4 - 10.8 x10E3/uL Final  12/29/2022 11.2 (H) 4.0 - 10.5 K/uL Final   RBC  Date Value Ref Range Status  08/13/2024 4.44 4.14 - 5.80 x10E6/uL Final  12/29/2022 5.03 4.22 - 5.81 MIL/uL Final   Hemoglobin  Date Value Ref Range Status  08/13/2024 13.5 13.0 - 17.7 g/dL Final   Hematocrit  Date Value Ref Range Status  08/13/2024 41.3 37.5 - 51.0 % Final   MCHC  Date Value Ref Range Status  08/13/2024 32.7 31.5 - 35.7 g/dL Final  96/86/7975 66.0 30.0 - 36.0 g/dL Final   [facility]  Date Value Ref Range Status  08/13/2024 30.4 26.6 - 33.0 pg Final  12/29/2022 30.2 26.0 -  34.0 pg Final   MCV  Date Value Ref Range Status  08/13/2024 93 79 - 97 fL Final   No results found for: PLTCOUNTKUC, LABPLAT, POCPLA RDW  Date Value Ref Range Status  08/13/2024 13.5 11.6 - 15.4 % Final

## 2024-09-07 NOTE — Telephone Encounter (Signed)
 Requested Prescriptions  Pending Prescriptions Disp Refills   QUEtiapine  (SEROQUEL ) 200 MG tablet [Pharmacy Med Name: QUETIAPINE   200MG   TAB] 90 tablet 3    Sig: TAKE 1 TABLET BY MOUTH EVERY  NIGHT AT BEDTIME     Not Delegated - Psychiatry:  Antipsychotics - Second Generation (Atypical) - quetiapine  Failed - 09/07/2024 11:56 AM      Failed - This refill cannot be delegated      Failed - TSH in normal range and within 360 days    TSH  Date Value Ref Range Status  05/27/2022 8.240 (H) 0.450 - 4.500 uIU/mL Final         Failed - Last BP in normal range    BP Readings from Last 1 Encounters:  08/17/24 (!) 164/74         Failed - Lipid Panel in normal range within the last 12 months    Cholesterol, Total  Date Value Ref Range Status  08/13/2024 109 100 - 199 mg/dL Final   LDL Cholesterol (Calc)  Date Value Ref Range Status  11/29/2017 68 mg/dL (calc) Final    Comment:    Reference range: <100 . Desirable range <100 mg/dL for primary prevention;   <70 mg/dL for patients with CHD or diabetic patients  with > or = 2 CHD risk factors. SABRA LDL-C is now calculated using the Martin-Hopkins  calculation, which is a validated novel method providing  better accuracy than the Friedewald equation in the  estimation of LDL-C.  Gladis APPLETHWAITE et al. SANDREA. 7986;689(80): 2061-2068  (http://education.[facility].com/faq/FAQ164)    LDL Chol Calc (NIH)  Date Value Ref Range Status  08/13/2024 52 0 - 99 mg/dL Final   HDL  Date Value Ref Range Status  08/13/2024 30 (L) >39 mg/dL Final   Triglycerides  Date Value Ref Range Status  08/13/2024 158 (H) 0 - 149 mg/dL Final         Passed - Last Heart Rate in normal range    Pulse Readings from Last 1 Encounters:  08/17/24 (!) 57         Passed - Valid encounter within last 6 months    Recent Outpatient Visits           3 weeks ago Encounter for [employer] annual wellness exam   [place] [facility] [place], Darice,  NP   3 weeks ago COPD with chronic bronchitis and emphysema (HCC)   [place] [facility] [place], Darice, NP   3 months ago Type 2 diabetes mellitus with peripheral neuropathy [facility])   [facility] Darice, NP   6 months ago COPD with chronic bronchitis and emphysema (HCC)   [place] [facility] Mel[facility] Darice, NP   7 months ago Type 2 diabetes mellitus with peripheral neuropathy [facility])   [place] [facility] Melvin Darice, NP       Future Appointments             In 5 days Vivienne Lonni Ingle, NP [place] [facility] - CBC within normal limits and completed in the last 12 months    WBC  Date Value Ref Range Status  08/13/2024 6.7 3.4 - 10.8 x10E3/uL Final  12/29/2022 11.2 (H) 4.0 - 10.5 K/uL Final   RBC  Date Value Ref Range Status  08/13/2024 4.44 4.14 - 5.80 x10E6/uL Final  12/29/2022 5.03 4.22 - 5.81  MIL/uL Final   Hemoglobin  Date Value Ref Range Status  08/13/2024 13.5 13.0 - 17.7 g/dL Final   Hematocrit  Date Value Ref Range Status  08/13/2024 41.3 37.5 - 51.0 % Final   MCHC  Date Value Ref Range Status  08/13/2024 32.7 31.5 - 35.7 g/dL Final  96/86/7975 66.0 30.0 - 36.0 g/dL Final   [facility]  Date Value Ref Range Status  08/13/2024 30.4 26.6 - 33.0 pg Final  12/29/2022 30.2 26.0 - 34.0 pg Final   MCV  Date Value Ref Range Status  08/13/2024 93 79 - 97 fL Final   No results found for: PLTCOUNTKUC, LABPLAT, POCPLA RDW  Date Value Ref Range Status  08/13/2024 13.5 11.6 - 15.4 % Final         Passed - CMP within normal limits and completed in the last 12 months    Albumin  Date Value Ref Range Status  08/13/2024 4.2 3.8 - 4.8 g/dL Final   Alkaline Phosphatase  Date Value Ref Range Status  08/13/2024 66 47 - 123 IU/L Final   Alkaline phosphatase (APISO)  Date Value Ref Range Status  11/29/2017 55 40 - 115 U/L Final    ALT  Date Value Ref Range Status  08/13/2024 20 0 - 44 IU/L Final   AST  Date Value Ref Range Status  08/13/2024 14 0 - 40 IU/L Final   BUN  Date Value Ref Range Status  08/13/2024 16 8 - 27 mg/dL Final   Calcium   Date Value Ref Range Status  08/13/2024 9.3 8.6 - 10.2 mg/dL Final   CO2  Date Value Ref Range Status  08/13/2024 22 20 - 29 mmol/L Final   Creat  Date Value Ref Range Status  11/29/2017 1.03 0.70 - 1.18 mg/dL Final    Comment:    For patients >65 years of age, the reference limit for Creatinine is approximately 13% higher for people identified as African-American. .    Creatinine, Ser  Date Value Ref Range Status  08/13/2024 1.10 0.61 - 1.24 mg/dL Final   Creatinine, Urine  Date Value Ref Range Status  04/21/2017 206 20 - 370 mg/dL Final   Glucose  Date Value Ref Range Status  08/13/2024 99 70 - 99 mg/dL Final  98/73/7983 896 mg/dL Final   Glucose, Bld  Date Value Ref Range Status  12/29/2022 104 (H) 70 - 99 mg/dL Final    Comment:    Glucose reference range applies only to samples taken after fasting for at least 8 hours.   POC Glucose  Date Value Ref Range Status  03/22/2017 125 (A) 70 - 99 mg/dl Final   Glucose-Capillary  Date Value Ref Range Status  12/31/2022 106 (H) 70 - 99 mg/dL Final    Comment:    Glucose reference range applies only to samples taken after fasting for at least 8 hours.   Potassium  Date Value Ref Range Status  08/13/2024 3.9 3.5 - 5.2 mmol/L Final   Sodium  Date Value Ref Range Status  08/13/2024 140 134 - 144 mmol/L Final   Bilirubin Total  Date Value Ref Range Status  08/13/2024 0.4 0.0 - 1.2 mg/dL Final   Bilirubin, Total  Date Value Ref Range Status  11/12/2014 0.4 mg/dL Final   Bilirubin, Direct  Date Value Ref Range Status  02/05/2016 0.13 0.00 - 0.40 mg/dL Final   Protein,UA  Date Value Ref Range Status  05/27/2022 Negative Negative/Trace Final   Total Protein  Date Value Ref  Range  Status  08/13/2024 6.7 6.0 - 8.5 g/dL Final   GFR, Est African American  Date Value Ref Range Status  04/21/2017 86 >=60 mL/min Final   GFR calc Af Amer  Date Value Ref Range Status  12/05/2020 91 >59 mL/min/1.73 Final    Comment:    **In accordance with recommendations from the NKF-ASN Task force,**   [facility] is in the process of updating its eGFR calculation to the   2021 CKD-EPI creatinine equation that estimates kidney function   without a race variable.    eGFR  Date Value Ref Range Status  08/13/2024 76 >59 mL/min/1.73 Final   GFR, Est Non African American  Date Value Ref Range Status  04/21/2017 74 >=60 mL/min Final   GFR, Estimated  Date Value Ref Range Status  12/29/2022 >60 >60 mL/min Final    Comment:    (NOTE) Calculated using the CKD-EPI Creatinine Equation (2021)           metFORMIN  (GLUCOPHAGE ) 1000 MG tablet [Pharmacy Med Name: metFORMIN  HCl 1000 MG Oral Tablet] 200 tablet 0    Sig: TAKE 1 TABLET BY MOUTH TWICE  DAILY WITH MEALS     Endocrinology:  Diabetes - Biguanides Failed - 09/07/2024 11:56 AM      Failed - B12 Level in normal range and within 720 days    Vitamin B-12  Date Value Ref Range Status  02/08/2024 194 (L) 232 - 1,245 pg/mL Final         Passed - Cr in normal range and within 360 days    Creat  Date Value Ref Range Status  11/29/2017 1.03 0.70 - 1.18 mg/dL Final    Comment:    For patients >4 years of age, the reference limit for Creatinine is approximately 13% higher for people identified as African-American. .    Creatinine, Ser  Date Value Ref Range Status  08/13/2024 1.10 0.61 - 1.24 mg/dL Final   Creatinine, Urine  Date Value Ref Range Status  04/21/2017 206 20 - 370 mg/dL Final         Passed - HBA1C is between 0 and 7.9 and within 180 days    HB A1C (BAYER DCA - WAIVED)  Date Value Ref Range Status  12/05/2020 6.9 <7.0 % Final    Comment:                                          Diabetic Adult             <7.0                                       Healthy Adult        4.3 - 5.7                                                           (DCCT/NGSP) American Diabetes Association's Summary of Glycemic Recommendations for Adults with Diabetes: Hemoglobin A1c <7.0%. More stringent glycemic goals (A1c <6.0%) may further reduce complications at the cost of increased risk of hypoglycemia.    Hgb A1c MFr Bld  Date Value Ref Range Status  08/13/2024 5.2 4.8 - 5.6 % Final    Comment:             Prediabetes: 5.7 - 6.4          Diabetes: >6.4          Glycemic control for adults with diabetes: <7.0          Passed - eGFR in normal range and within 360 days    GFR, Est African American  Date Value Ref Range Status  04/21/2017 86 >=60 mL/min Final   GFR calc Af Amer  Date Value Ref Range Status  12/05/2020 91 >59 mL/min/1.73 Final    Comment:    **In accordance with recommendations from the NKF-ASN Task force,**   [facility] is in the process of updating its eGFR calculation to the   2021 CKD-EPI creatinine equation that estimates kidney function   without a race variable.    GFR, Est Non African American  Date Value Ref Range Status  04/21/2017 74 >=60 mL/min Final   GFR, Estimated  Date Value Ref Range Status  12/29/2022 >60 >60 mL/min Final    Comment:    (NOTE) Calculated using the CKD-EPI Creatinine Equation (2021)    eGFR  Date Value Ref Range Status  08/13/2024 76 >59 mL/min/1.73 Final         Passed - Valid encounter within last 6 months    Recent Outpatient Visits           3 weeks ago Encounter for Medicare annual wellness exam   [place] [facility] Melvin Pao, NP   3 weeks ago COPD with chronic bronchitis and emphysema (HCC)   [place] [facility] Mel[facility] Pao, NP   3 months ago Type 2 diabetes mellitus with peripheral neuropathy (HCC)   [place] [facility] Melvin Pao, NP   6 months ago COPD with  chronic bronchitis and emphysema (HCC)   [place] [facility] Melvin Pao, NP   7 months ago Type 2 diabetes mellitus with peripheral neuropathy [facility])   [facility] Melvin Pao, NP       Future Appointments             In 5 days Vivienne, Lonni Ingle, NP [place] [facility] - CBC within normal limits and completed in the last 12 months    WBC  Date Value Ref Range Status  08/13/2024 6.7 3.4 - 10.8 x10E3/uL Final  12/29/2022 11.2 (H) 4.0 - 10.5 K/uL Final   RBC  Date Value Ref Range Status  08/13/2024 4.44 4.14 - 5.80 x10E6/uL Final  12/29/2022 5.03 4.22 - 5.81 MIL/uL Final   Hemoglobin  Date Value Ref Range Status  08/13/2024 13.5 13.0 - 17.7 g/dL Final   Hematocrit  Date Value Ref Range Status  08/13/2024 41.3 37.5 - 51.0 % Final   MCHC  Date Value Ref Range Status  08/13/2024 32.7 31.5 - 35.7 g/dL Final  96/86/7975 66.0 30.0 - 36.0 g/dL Final   [facility]  Date Value Ref Range Status  08/13/2024 30.4 26.6 - 33.0 pg Final  12/29/2022 30.2 26.0 - 34.0 pg Final   MCV  Date Value Ref Range Status  08/13/2024 93 79 - 97 fL Final   No results found for: PLTCOUNTKUC, LABPLAT, POCPLA RDW  Date Value Ref Range Status  08/13/2024 13.5 11.6 -  15.4 % Final

## 2024-09-11 ENCOUNTER — Ambulatory Visit: Admitting: Nurse Practitioner

## 2024-09-11 NOTE — Assessment & Plan Note (Deleted)
 Managed on metformin , Trulicity  08/13/2024: HgbA1c 5.2

## 2024-09-11 NOTE — Progress Notes (Deleted)
 Cardiology Office Note:    Date:  09/11/2024   ID:  Juan DELENA Fern Mickey., DOB [birth year], MRN [id number]  PCP:  Melvin Pao, NP   [facility] Providers Cardiologist:  Deatrice Cage, MD { Click to update primary MD,subspecialty MD or APP then REFRESH:1}    Referring MD: Melvin Pao, NP   Chief complaint: CAD follow-up     History of Present Illness:    ROS:   Please see the history of present illness.    *** All other systems reviewed and are negative.     Past Medical History:  Diagnosis Date   Anxiety    Atelectasis of left lung 03/25/2017   Chronic, continuous use of opioids    COPD (chronic obstructive pulmonary disease) (HCC)    Coronary artery disease    a. 1997 s/p CABG x 4 (LIMA->LAD, VG->OM2, VG->D2, VG->RPDA); b. 2008 Cath/PCI: VG->D1 90ost (s/p DES). Other grafts patent; c. 12/2022 MV: antlat/inflat/inf ischemia/infarct; d. 12/2022 Cath: LM diff dzs, LAD 100ost, LCX ok, OM2 100, OM branches fill via R->L collats from RV marginal branch,RCA 60p, 100d, RPL3 fills via collats from OM3, VG->OM2 100, VG->D2 100, VG->RPDA 100, LIMA->LAD ok-->Med Rx.   Degenerative disc disease, lumbar    Diabetes (HCC)    Diastolic dysfunction    a. 01/2023 Echo: EF 55-60%, no rwma, GrI DD, nl RV fxn, mild MR, Ao sclerosis.   DJD (degenerative joint disease)    GERD (gastroesophageal reflux disease)    Heart attack (HCC)    History of diverticulitis    Hypertension    Hypertriglyceridemia    Hypokalemia    Insomnia    Mitral regurgitation    Sleep apnea    Vitamin D  deficiency    Wears dentures    full upper    Past Surgical History:  Procedure Laterality Date   COLONOSCOPY WITH PROPOFOL  N/A 01/28/2017   Procedure: COLONOSCOPY WITH PROPOFOL ;  Surgeon: Rogelia Copping, MD;  Location: [facility];  Service: Endoscopy;  Laterality: N/A;  diabetic - oral meds   COLONOSCOPY WITH PROPOFOL  N/A 06/29/2022   Procedure: COLONOSCOPY WITH PROPOFOL ;  Surgeon:  Copping Rogelia, MD;  Location: [facility];  Service: Endoscopy;  Laterality: N/A;   CORONARY ARTERY BYPASS GRAFT  1997   CORONARY STENT PLACEMENT  11/2006   FOOT SURGERY  2003   HERNIA REPAIR     LEFT HEART CATH AND CORONARY ANGIOGRAPHY Left 12/31/2022   Procedure: LEFT HEART CATH AND CORONARY ANGIOGRAPHY;  Surgeon: Cage Deatrice DELENA, MD;  Location: [facility];  Service: Cardiovascular;  Laterality: Left;   POLYPECTOMY N/A 01/28/2017   Procedure: POLYPECTOMY;  Surgeon: Rogelia Copping, MD;  Location: [facility];  Service: Endoscopy;  Laterality: N/A;   UMBILICAL HERNIA REPAIR  04/2012    Current Medications: No outpatient medications have been marked as taking for the 09/12/24 encounter (Appointment) with Juan Lonni Ingle, NP.     Allergies:   Ace inhibitors and Diphenhydramine   Social History   Socioeconomic History   Marital status: Married    Spouse name: Not on file   Number of children: 2   Years of education: 12   Highest education level: High school graduate  Occupational History   Occupation: Retired    Comment: formerly worked at [employer])  Tobacco Use   Smoking status: Former    Current packs/day: 0.00    Types: Cigarettes    Quit date: 10/19/1955    Years since quitting:  68.9    Passive exposure: Past   Smokeless tobacco: Never   Tobacco comments:    former light smoker; quit in his teens  Vaping Use   Vaping status: Never Used  Substance and Sexual Activity   Alcohol  use: No    Alcohol /week: 0.0 standard drinks of alcohol    Drug use: No   Sexual activity: Yes    Partners: Female  Other Topics Concern   Not on file  Social History Narrative   Not on file   Social Drivers of Health   Financial Resource Strain: Low Risk  (08/17/2024)   Overall Financial Resource Strain (CARDIA)    Difficulty of Paying Living Expenses: Not hard at all  Food Insecurity: No Food Insecurity (08/17/2024)   Hunger Vital Sign     Worried About Running Out of Food in the Last Year: Never true    Ran Out of Food in the Last Year: Never true  Transportation Needs: No Transportation Needs (08/17/2024)   PRAPARE - Administrator, Civil Service (Medical): No    Lack of Transportation (Non-Medical): No  Physical Activity: Inactive (08/17/2024)   Exercise Vital Sign    Days of Exercise per Week: 0 days    Minutes of Exercise per Session: 0 min  Stress: No Stress Concern Present (08/17/2024)   [employer] of Occupational Health - Occupational Stress Questionnaire    Feeling of Stress: Not at all  Social Connections: Moderately Isolated (08/17/2024)   Social Connection and Isolation Panel    Frequency of Communication with Friends and Family: Three times a week    Frequency of Social Gatherings with Friends and Family: Never    Attends Religious Services: Never    Database Administrator or Organizations: No    Attends Engineer, Structural: Never    Marital Status: Married     Family History: The patient's ***family history includes Arthritis in his brother; Bone cancer in his brother; Breast cancer in his paternal aunt; Cancer in his father; Colon cancer in his father; Depression in his father and sister; Heart disease in his father and mother; Hypertension in his father; Kidney disease in his mother; Kidney failure in his mother; Parkinson's disease in his father; Stroke in his brother, maternal grandmother, and mother; Throat cancer in his father. There is no history of Prostate cancer, Kidney cancer, or Bladder Cancer.  EKGs/Labs/Other Studies Reviewed:    The following studies were reviewed today: ***      Recent Labs: 08/13/2024: ALT 20; BUN 16; Creatinine, Ser 1.10; Hemoglobin 13.5; Platelets 233; Potassium 3.9; Sodium 140  Recent Lipid Panel    Component Value Date/Time   CHOL 109 08/13/2024 1123   TRIG 158 (H) 08/13/2024 1123   HDL 30 (L) 08/13/2024 1123   CHOLHDL 3.6 08/13/2024  1123   CHOLHDL 3.9 11/29/2017 0936   VLDL 42 (H) 04/21/2017 0852   LDLCALC 52 08/13/2024 1123   LDLCALC 68 11/29/2017 0936     Risk Assessment/Calculations:   {Does this patient have ATRIAL FIBRILLATION?:[id number]}  No BP recorded.  {Refresh Note OR Click here to enter BP  :1}***         Physical Exam:    VS:  There were no vitals taken for this visit.       Wt Readings from Last 3 Encounters:  08/17/24 198 lb 12.8 oz (90.2 kg)  08/13/24 199 lb 12.8 oz (90.6 kg)  07/10/24 204 lb 12.8 oz (92.9 kg)  GEN: *** Well nourished, well developed in no acute distress HEENT: Normal NECK: No JVD; No carotid bruits CARDIAC: *** S1-S2 normal, RRR, no murmurs, rubs, gallops RESPIRATORY:  Clear to auscultation without rales, wheezing or rhonchi  MUSCULOSKELETAL:  No edema; No deformity  SKIN: Warm and dry NEUROLOGIC:  Alert and oriented x 3 PSYCHIATRIC:  Normal affect       Assessment & Plan Coronary artery disease involving native coronary artery of native heart, unspecified whether angina present S/P CABG x 4 EKG Symptoms Need for nitroglycerin? Continue atorvastatin  40 mg daily Continue carvedilol  6.25 mg twice daily Continue telmisartan  40 mg daily Continue aspirin  81 mg daily 08/13/2024: Hgb 13.5, platelets 233 Primary hypertension BPs? Continue telmisartan  40 mg daily Continue carvedilol  6.25 mg twice daily 08/13/2024: BUN 16, creatinine 1.10, K 3.9 Mixed hyperlipidemia Lipid panel 08/13/2024: Cholesterol 109, HDL 30, LDL 52, triglycerides 158 LDL goal <55 Continue atorvastatin  40 mg daily Type 2 diabetes mellitus with peripheral neuropathy (HCC) Managed on metformin , Trulicity  08/13/2024: HgbA1c 5.2 COPD with chronic bronchitis and emphysema (HCC) Managed on Breztri        {Are you ordering a CV Procedure (e.g. stress test, cath, DCCV, TEE, etc)?   Press F2        :[id number]}    Medication Adjustments/Labs and Tests Ordered: Current medicines are  reviewed at length with the patient today.  Concerns regarding medicines are outlined above.  No orders of the defined types were placed in this encounter.  No orders of the defined types were placed in this encounter.   There are no Patient Instructions on file for this visit.   Signed, Miriam FORBES Shams, NP  09/11/2024 8:08 PM    [facility]

## 2024-09-11 NOTE — Assessment & Plan Note (Deleted)
 Managed on Breztri 

## 2024-09-12 ENCOUNTER — Ambulatory Visit: Attending: Nurse Practitioner | Admitting: Nurse Practitioner

## 2024-09-12 DIAGNOSIS — J4489 Other specified chronic obstructive pulmonary disease: Secondary | ICD-10-CM

## 2024-09-12 DIAGNOSIS — I2510 Atherosclerotic heart disease of native coronary artery without angina pectoris: Secondary | ICD-10-CM

## 2024-09-12 DIAGNOSIS — E1142 Type 2 diabetes mellitus with diabetic polyneuropathy: Secondary | ICD-10-CM

## 2024-09-12 DIAGNOSIS — I10 Essential (primary) hypertension: Secondary | ICD-10-CM

## 2024-09-12 DIAGNOSIS — E782 Mixed hyperlipidemia: Secondary | ICD-10-CM

## 2024-09-12 DIAGNOSIS — Z951 Presence of aortocoronary bypass graft: Secondary | ICD-10-CM

## 2024-09-20 ENCOUNTER — Other Ambulatory Visit: Payer: Self-pay | Admitting: Nurse Practitioner

## 2024-09-20 DIAGNOSIS — E1142 Type 2 diabetes mellitus with diabetic polyneuropathy: Secondary | ICD-10-CM

## 2024-09-20 NOTE — Telephone Encounter (Signed)
 Copied from CRM #[id number]. Topic: Clinical - Medication Refill >> Sep 20, 2024  4:31 PM Juan Le wrote: Medication: metFORMIN  (GLUCOPHAGE ) 1000 MG tablet  Has the patient contacted their pharmacy? Yes (Agent: If no, request that the patient contact the pharmacy for the refill. If patient does not wish to contact the pharmacy document the reason why and proceed with request.) (Agent: If yes, when and what did the pharmacy advise?)  This is the patient's preferred pharmacy:  [facility] - [place], [place] - [address] [place] [address] Phone: [phone] Fax: [phone]   Is this the correct pharmacy for this prescription? Yes If no, delete pharmacy and type the correct one.   Has the prescription been filled recently? Yes  Is the patient out of the medication? Yes  Has the patient been seen for an appointment in the last year OR does the patient have an upcoming appointment? Yes  Can we respond through MyChart? Yes  Agent: Please be advised that Rx refills may take up to 3 business days. We ask that you follow-up with your pharmacy.

## 2024-09-24 ENCOUNTER — Other Ambulatory Visit: Payer: Self-pay | Admitting: Nurse Practitioner

## 2024-09-24 ENCOUNTER — Telehealth: Payer: Self-pay | Admitting: Nurse Practitioner

## 2024-09-24 DIAGNOSIS — E1142 Type 2 diabetes mellitus with diabetic polyneuropathy: Secondary | ICD-10-CM

## 2024-09-24 MED ORDER — METFORMIN HCL 1000 MG PO TABS: 1000.0000 mg | ORAL_TABLET | Freq: Two times a day (BID) | ORAL | 1 refills | Status: AC

## 2024-09-24 MED ORDER — METFORMIN HCL 1000 MG PO TABS: 1000.0000 mg | ORAL_TABLET | Freq: Two times a day (BID) | ORAL | 0 refills | Status: DC

## 2024-09-24 NOTE — Telephone Encounter (Signed)
 Medication sent to [facility] court.

## 2024-09-24 NOTE — Telephone Encounter (Signed)
 Resending to another pharmacy: [facility].  Requested Prescriptions  Pending Prescriptions Disp Refills   metFORMIN  (GLUCOPHAGE ) 1000 MG tablet 200 tablet 0    Sig: Take 1 tablet (1,000 mg total) by mouth 2 (two) times daily with a meal.     Endocrinology:  Diabetes - Biguanides Failed - 09/24/2024  1:49 PM      Failed - B12 Level in normal range and within 720 days    Vitamin B-12  Date Value Ref Range Status  02/08/2024 194 (L) 232 - 1,245 pg/mL Final         Passed - Cr in normal range and within 360 days    Creat  Date Value Ref Range Status  11/29/2017 1.03 0.70 - 1.18 mg/dL Final    Comment:    For patients >41 years of age, the reference limit for Creatinine is approximately 13% higher for people identified as African-American. .    Creatinine, Ser  Date Value Ref Range Status  08/13/2024 1.10 0.61 - 1.24 mg/dL Final   Creatinine, Urine  Date Value Ref Range Status  04/21/2017 206 20 - 370 mg/dL Final         Passed - HBA1C is between 0 and 7.9 and within 180 days    HB A1C (BAYER DCA - WAIVED)  Date Value Ref Range Status  12/05/2020 6.9 <7.0 % Final    Comment:                                          Diabetic Adult            <7.0                                       Healthy Adult        4.3 - 5.7                                                           (DCCT/NGSP) American Diabetes Association's Summary of Glycemic Recommendations for Adults with Diabetes: Hemoglobin A1c <7.0%. More stringent glycemic goals (A1c <6.0%) may further reduce complications at the cost of increased risk of hypoglycemia.    Hgb A1c MFr Bld  Date Value Ref Range Status  08/13/2024 5.2 4.8 - 5.6 % Final    Comment:             Prediabetes: 5.7 - 6.4          Diabetes: >6.4          Glycemic control for adults with diabetes: <7.0          Passed - eGFR in normal range and within 360 days    GFR, Est African American  Date Value Ref Range Status  04/21/2017 86 >=60  mL/min Final   GFR calc Af Amer  Date Value Ref Range Status  12/05/2020 91 >59 mL/min/1.73 Final    Comment:    **In accordance with recommendations from the NKF-ASN Task force,**   [facility] is in the process of updating its eGFR calculation to the   2021 CKD-EPI creatinine equation that estimates  kidney function   without a race variable.    GFR, Est Non African American  Date Value Ref Range Status  04/21/2017 74 >=60 mL/min Final   GFR, Estimated  Date Value Ref Range Status  12/29/2022 >60 >60 mL/min Final    Comment:    (NOTE) Calculated using the CKD-EPI Creatinine Equation (2021)    eGFR  Date Value Ref Range Status  08/13/2024 76 >59 mL/min/1.73 Final         Passed - Valid encounter within last 6 months    Recent Outpatient Visits           1 month ago Encounter for Medicare annual wellness exam   [facility] Melvin Pao, NP   1 month ago COPD with chronic bronchitis and emphysema (HCC)   [facility] Melvin Pao, NP   4 months ago Type 2 diabetes mellitus with peripheral neuropathy (HCC)   [facility] Melvin Pao, NP   6 months ago COPD with chronic bronchitis and emphysema (HCC)   [facility] Melvin Pao, NP   7 months ago Type 2 diabetes mellitus with peripheral neuropathy [facility])   [facility] Melvin Pao, NP       Future Appointments             In 2 days Vivienne, Lonni Ingle, NP [facility] - CBC within normal limits and completed in the last 12 months    WBC  Date Value Ref Range Status  08/13/2024 6.7 3.4 - 10.8 x10E3/uL Final  12/29/2022 11.2 (H) 4.0 - 10.5 K/uL Final   RBC  Date Value Ref Range Status  08/13/2024 4.44 4.14 - 5.80 x10E6/uL Final  12/29/2022 5.03 4.22 - 5.81 MIL/uL Final   Hemoglobin  Date Value Ref Range Status   08/13/2024 13.5 13.0 - 17.7 g/dL Final   Hematocrit  Date Value Ref Range Status  08/13/2024 41.3 37.5 - 51.0 % Final   MCHC  Date Value Ref Range Status  08/13/2024 32.7 31.5 - 35.7 g/dL Final  96/86/7975 66.0 30.0 - 36.0 g/dL Final   [facility]  Date Value Ref Range Status  08/13/2024 30.4 26.6 - 33.0 pg Final  12/29/2022 30.2 26.0 - 34.0 pg Final   MCV  Date Value Ref Range Status  08/13/2024 93 79 - 97 fL Final   No results found for: PLTCOUNTKUC, LABPLAT, POCPLA RDW  Date Value Ref Range Status  08/13/2024 13.5 11.6 - 15.4 % Final

## 2024-09-24 NOTE — Telephone Encounter (Signed)
 Previous RX sent to mail order but patient is requesting to get medication sent to [facility]. Can we resend please?

## 2024-09-24 NOTE — Telephone Encounter (Signed)
 Prescription Request  09/24/2024  LOV: 08/17/2024  What is the name of the medication or equipment? metFORMIN  (GLUCOPHAGE ) 1000 MG tablet   Have you contacted your pharmacy to request a refill? No   Which pharmacy would you like this sent to?   [address] [place], [place] [address]    Patient notified that their request is being sent to the clinical staff for review and that they should receive a response within 2 business days.   Please advise at Mobile [phone] (mobile)

## 2024-09-25 NOTE — Progress Notes (Unsigned)
 Cardiology Office Note:    Date:  09/25/2024   ID:  Juan DELENA Fern Mickey., DOB Jul 21, 1946, MRN [id number]  PCP:  Melvin Pao, NP   [facility] Providers Cardiologist:  Deatrice Cage, MD { Click to update primary MD,subspecialty MD or APP then REFRESH:1}    Referring MD: Melvin Pao, NP   Chief complaint: 99-month follow-up     History of Present Illness:   Juan DELENA Fern Mickey. is a 78 y.o. male with a hx of CAD status post CABG x 4 and subsequent PCI, hypertension, hyperlipidemia, diabetes, COPD, sleep apnea, GERD, diastolic dysfunction, and sleep apnea, who presents for CAD follow-up.   1997 underwent CABG X4 (LIMA-LAD, SVG-OM 2, SVG-D2, SVG-RPDA), PCI of vein graft to the diagonal in 2008.  Stress testing in February 2024 showed inferior and inferolateral wall as well as anterolateral wall ischemia.  LHC in 12/2022 showed severe native multivessel disease with 3 occluded grafts, and only the LIMA-LAD remaining patent.  Echo 01/2023: LVEF 55-60%, G1 DD, mild MR, aortic sclerosis.  Last seen by cardiology 03/14/2024, he is doing well from a cardiovascular standpoint.  CAD s/p CABG X4 in 1997 EKG Symptoms Continue ASA 81 mg daily Continue atorvastatin  40 mg daily Continue carvedilol  6.25 mg twice daily  HTN BPs? Continue carvedilol  6.25 mg twice daily Continue telmisartan  40 mg daily  HLD Continue atorvastatin  40 mg daily  T2DM Managed on Trulicity  and metformin   COPD Managed on Breztri    ROS:   Please see the history of present illness.    *** All other systems reviewed and are negative.     Past Medical History:  Diagnosis Date   Anxiety    Atelectasis of left lung 03/25/2017   Chronic, continuous use of opioids    COPD (chronic obstructive pulmonary disease) (HCC)    Coronary artery disease    a. 1997 s/p CABG x 4 (LIMA->LAD, VG->OM2, VG->D2, VG->RPDA); b. 2008 Cath/PCI: VG->D1 90ost (s/p DES). Other grafts patent; c. 12/2022 MV:  antlat/inflat/inf ischemia/infarct; d. 12/2022 Cath: LM diff dzs, LAD 100ost, LCX ok, OM2 100, OM branches fill via R->L collats from RV marginal branch,RCA 60p, 100d, RPL3 fills via collats from OM3, VG->OM2 100, VG->D2 100, VG->RPDA 100, LIMA->LAD ok-->Med Rx.   Degenerative disc disease, lumbar    Diabetes (HCC)    Diastolic dysfunction    a. 01/2023 Echo: EF 55-60%, no rwma, GrI DD, nl RV fxn, mild MR, Ao sclerosis.   DJD (degenerative joint disease)    GERD (gastroesophageal reflux disease)    Heart attack (HCC)    History of diverticulitis    Hypertension    Hypertriglyceridemia    Hypokalemia    Insomnia    Mitral regurgitation    Sleep apnea    Vitamin D  deficiency    Wears dentures    full upper    Past Surgical History:  Procedure Laterality Date   COLONOSCOPY WITH PROPOFOL  N/A 01/28/2017   Procedure: COLONOSCOPY WITH PROPOFOL ;  Surgeon: Rogelia Copping, MD;  Location: [facility];  Service: Endoscopy;  Laterality: N/A;  diabetic - oral meds   COLONOSCOPY WITH PROPOFOL  N/A 06/29/2022   Procedure: COLONOSCOPY WITH PROPOFOL ;  Surgeon: Copping Rogelia, MD;  Location: [facility];  Service: Endoscopy;  Laterality: N/A;   CORONARY ARTERY BYPASS GRAFT  1997   CORONARY STENT PLACEMENT  11/2006   FOOT SURGERY  2003   HERNIA REPAIR     LEFT HEART CATH AND CORONARY ANGIOGRAPHY Left 12/31/2022   Procedure: LEFT  HEART CATH AND CORONARY ANGIOGRAPHY;  Surgeon: Darron Deatrice LABOR, MD;  Location: [facility];  Service: Cardiovascular;  Laterality: Left;   POLYPECTOMY N/A 01/28/2017   Procedure: POLYPECTOMY;  Surgeon: Rogelia Copping, MD;  Location: [facility];  Service: Endoscopy;  Laterality: N/A;   UMBILICAL HERNIA REPAIR  04/2012    Current Medications: No outpatient medications have been marked as taking for the 09/26/24 encounter (Appointment) with Vivienne Lonni Ingle, NP.     Allergies:   Ace inhibitors and Diphenhydramine   Social History   Socioeconomic  History   Marital status: Married    Spouse name: Not on file   Number of children: 2   Years of education: 12   Highest education level: High school graduate  Occupational History   Occupation: Retired    Comment: formerly worked at [employer])  Tobacco Use   Smoking status: Former    Current packs/day: 0.00    Types: Cigarettes    Quit date: 10/19/1955    Years since quitting: 68.9    Passive exposure: Past   Smokeless tobacco: Never   Tobacco comments:    former light smoker; quit in his teens  Vaping Use   Vaping status: Never Used  Substance and Sexual Activity   Alcohol  use: No    Alcohol /week: 0.0 standard drinks of alcohol    Drug use: No   Sexual activity: Yes    Partners: Female  Other Topics Concern   Not on file  Social History Narrative   Not on file   Social Drivers of Health   Financial Resource Strain: Low Risk  (08/17/2024)   Overall Financial Resource Strain (CARDIA)    Difficulty of Paying Living Expenses: Not hard at all  Food Insecurity: No Food Insecurity (08/17/2024)   Hunger Vital Sign    Worried About Running Out of Food in the Last Year: Never true    Ran Out of Food in the Last Year: Never true  Transportation Needs: No Transportation Needs (08/17/2024)   PRAPARE - Administrator, Civil Service (Medical): No    Lack of Transportation (Non-Medical): No  Physical Activity: Inactive (08/17/2024)   Exercise Vital Sign    Days of Exercise per Week: 0 days    Minutes of Exercise per Session: 0 min  Stress: No Stress Concern Present (08/17/2024)   [employer] of Occupational Health - Occupational Stress Questionnaire    Feeling of Stress: Not at all  Social Connections: Moderately Isolated (08/17/2024)   Social Connection and Isolation Panel    Frequency of Communication with Friends and Family: Three times a week    Frequency of Social Gatherings with Friends and Family: Never    Attends Religious Services:  Never    Database Administrator or Organizations: No    Attends Engineer, Structural: Never    Marital Status: Married     Family History: The patient's ***family history includes Arthritis in his brother; Bone cancer in his brother; Breast cancer in his paternal aunt; Cancer in his father; Colon cancer in his father; Depression in his father and sister; Heart disease in his father and mother; Hypertension in his father; Kidney disease in his mother; Kidney failure in his mother; Parkinson's disease in his father; Stroke in his brother, maternal grandmother, and mother; Throat cancer in his father. There is no history of Prostate cancer, Kidney cancer, or Bladder Cancer.  EKGs/Labs/Other Studies Reviewed:    The following studies  were reviewed today: ***      Recent Labs: 08/13/2024: ALT 20; BUN 16; Creatinine, Ser 1.10; Hemoglobin 13.5; Platelets 233; Potassium 3.9; Sodium 140  Recent Lipid Panel    Component Value Date/Time   CHOL 109 08/13/2024 1123   TRIG 158 (H) 08/13/2024 1123   HDL 30 (L) 08/13/2024 1123   CHOLHDL 3.6 08/13/2024 1123   CHOLHDL 3.9 11/29/2017 0936   VLDL 42 (H) 04/21/2017 0852   LDLCALC 52 08/13/2024 1123   LDLCALC 68 11/29/2017 0936     Risk Assessment/Calculations:   {Does this patient have ATRIAL FIBRILLATION?:[id number]}  No BP recorded.  {Refresh Note OR Click here to enter BP  :1}***         Physical Exam:    VS:  There were no vitals taken for this visit.       Wt Readings from Last 3 Encounters:  08/17/24 198 lb 12.8 oz (90.2 kg)  08/13/24 199 lb 12.8 oz (90.6 kg)  07/10/24 204 lb 12.8 oz (92.9 kg)     GEN: *** Well nourished, well developed in no acute distress HEENT: Normal NECK: No JVD; No carotid bruits CARDIAC: *** S1-S2 normal, RRR, no murmurs, rubs, gallops RESPIRATORY:  Clear to auscultation without rales, wheezing or rhonchi  MUSCULOSKELETAL:  No edema; No deformity  SKIN: Warm and dry NEUROLOGIC:  Alert and  oriented x 3 PSYCHIATRIC:  Normal affect       Assessment & Plan        {Are you ordering a CV Procedure (e.g. stress test, cath, DCCV, TEE, etc)?   Press F2        :[id number]}    Medication Adjustments/Labs and Tests Ordered: Current medicines are reviewed at length with the patient today.  Concerns regarding medicines are outlined above.  No orders of the defined types were placed in this encounter.  No orders of the defined types were placed in this encounter.   There are no Patient Instructions on file for this visit.   Signed, Miriam FORBES Shams, NP  09/25/2024 6:31 PM    [facility]

## 2024-09-26 ENCOUNTER — Ambulatory Visit: Attending: Nurse Practitioner | Admitting: Nurse Practitioner

## 2024-09-26 ENCOUNTER — Encounter: Payer: Self-pay | Admitting: Nurse Practitioner

## 2024-09-26 VITALS — BP 138/60 | HR 52 | Ht 70.0 in | Wt 190.0 lb

## 2024-09-26 DIAGNOSIS — F4321 Adjustment disorder with depressed mood: Secondary | ICD-10-CM

## 2024-09-26 DIAGNOSIS — I700 Atherosclerosis of aorta: Secondary | ICD-10-CM

## 2024-09-26 DIAGNOSIS — E785 Hyperlipidemia, unspecified: Secondary | ICD-10-CM

## 2024-09-26 DIAGNOSIS — I2510 Atherosclerotic heart disease of native coronary artery without angina pectoris: Secondary | ICD-10-CM | POA: Diagnosis not present

## 2024-09-26 DIAGNOSIS — I10 Essential (primary) hypertension: Secondary | ICD-10-CM

## 2024-09-26 MED ORDER — TELMISARTAN 40 MG PO TABS: 40.0000 mg | ORAL_TABLET | Freq: Every day | ORAL | 1 refills | Status: AC

## 2024-09-26 NOTE — Assessment & Plan Note (Signed)
 Lipid 08/13/2024: chol 109, trig 158, hdl 30, ldl 52 Continue atorvastatin  40 mg daily

## 2024-09-26 NOTE — Patient Instructions (Signed)
 Medication Instructions:  RESUME the Telmisartan  (Micardis ) 40 mg once daily  *If you need a refill on your cardiac medications before your next appointment, please call your pharmacy*  Lab Work: Your provider would like for you to return in 2 weeks to have the following labs drawn: BMET.   Please go to [facility] [address] ([facility] [address], [place] [address] You do not need an appointment.  They are open from 8 am- 4:30 pm.  Lunch from 1:00 pm- 2:00 pm You will not need to be fasting.   You may also go to one of the following [facility]:  [address] [place], [place] [address] Phone: [phone] Lab hours: Mon-Fri 8 am- 5 pm    Lunch 12 pm- 1 pm  [address] [place],  [place]  [address]  [place]  Phone: [phone] Lab hours: 7 am- 4 pm Lunch 12 pm-1 pm   [address] [place],  [place]  [address]  [place]  Phone: [phone] Lab hours: Mon-Fri 8 am- 5 pm    Lunch 12 pm- 1 pm  If you have labs (blood work) drawn today and your tests are completely normal, you will receive your results only by: MyChart Message (if you have MyChart) OR A paper copy in the mail If you have any lab test that is abnormal or we need to change your treatment, we will call you to review the results.  Testing/Procedures: None ordered  Follow-Up: At [facility], you and your health needs are our priority.  As part of our continuing mission to provide you with exceptional heart care, our providers are all part of one team.  This team includes your primary Cardiologist (physician) and Advanced Practice Providers or APPs (Physician Assistants and Nurse Practitioners) who all work together to provide you with the care you need, when you need it.  Your next appointment:   1 month(s)  Provider:   Deatrice Cage, MD or Lonni Meager, NP    We recommend signing up for the patient portal called MyChart.  Sign up information is provided on this After Visit  Summary.  MyChart is used to connect with patients for Virtual Visits (Telemedicine).  Patients are able to view lab/test results, encounter notes, upcoming appointments, etc.  Non-urgent messages can be sent to your provider as well.   To learn more about what you can do with MyChart, go to [email or web address].   Other Instructions Please take to your primary care provider about depression. Please be sure to eat and drink protein shakes if needed.  Check your blood pressure daily for the next 4 weeks.  Keep a journal of these daily blood pressure and heart rate readings and call our office or send a message through MyChart with the results. Thank you!  It is best to check your BP 1-2 hours after taking your medications to see the medications effectiveness on your BP.    Here are some tips that our clinical pharmacists share for home BP monitoring:          Rest 10 minutes before taking your blood pressure.          Don't smoke or drink caffeinated beverages for at least 30 minutes before.          Take your blood pressure before (not after) you eat.          Sit comfortably with your back supported and both feet on the floor (don't cross your legs).  Elevate your arm to heart level on a table or a desk.          Use the proper sized cuff. It should fit smoothly and snugly around your bare upper arm. There should be enough room to slip a fingertip under the cuff. The bottom edge of the cuff should be 1 inch above the crease of the elbow.

## 2024-11-09 ENCOUNTER — Ambulatory Visit: Admitting: Nurse Practitioner

## 2024-11-09 ENCOUNTER — Other Ambulatory Visit: Payer: Self-pay | Admitting: Nurse Practitioner

## 2024-11-09 NOTE — Progress Notes (Unsigned)
 "    Office Visit    Patient Name: Juan Le. Date of Encounter: 11/09/2024  Primary Care Provider:  Melvin Pao, NP Primary Cardiologist:  Juan Cage, MD    Chief Complaint    79 y.o. male  with a history of CAD status post CABG x 4 and subsequent PCI, hypertension, hyperlipidemia, diabetes, COPD, sleep apnea, GERD, diastolic dysfunction, and sleep apnea, who presents for hypertensionfollow-up.   Past Medical History   Subjective   Past Medical History:  Diagnosis Date   Anxiety    Atelectasis of left lung 03/25/2017   Chronic, continuous use of opioids    COPD (chronic obstructive pulmonary disease) (HCC)    Coronary artery disease    a. 1997 s/p CABG x 4 (LIMA->LAD, VG->OM2, VG->D2, VG->RPDA); b. 2008 Cath/PCI: VG->D1 90ost (s/p DES). Other grafts patent; c. 12/2022 MV: antlat/inflat/inf ischemia/infarct; d. 12/2022 Cath: LM diff dzs, LAD 100ost, LCX ok, OM2 100, OM branches fill via R->L collats from RV marginal branch,RCA 60p, 100d, RPL3 fills via collats from OM3, VG->OM2 100, VG->D2 100, VG->RPDA 100, LIMA->LAD ok-->Med Rx.   Degenerative disc disease, lumbar    Diabetes (HCC)    Diastolic dysfunction    a. 01/2023 Echo: EF 55-60%, no rwma, GrI DD, nl RV fxn, mild MR, Ao sclerosis.   DJD (degenerative joint disease)    GERD (gastroesophageal reflux disease)    Heart attack (HCC)    History of diverticulitis    Hypertension    Hypertriglyceridemia    Hypokalemia    Insomnia    Mitral regurgitation    Sleep apnea    Vitamin D  deficiency    Wears dentures    full upper   Past Surgical History:  Procedure Laterality Date   COLONOSCOPY WITH PROPOFOL  N/A 01/28/2017   Procedure: COLONOSCOPY WITH PROPOFOL ;  Surgeon: Juan Copping, MD;  [facility];  Service: Endoscopy;  Laterality: N/A;  diabetic - oral meds   COLONOSCOPY WITH PROPOFOL  N/A 06/29/2022   Procedure: COLONOSCOPY WITH PROPOFOL ;  Surgeon: Le Rogelia, MD;  Location: [facility]  ENDOSCOPY;  Service: Endoscopy;  Laterality: N/A;   CORONARY ARTERY BYPASS GRAFT  1997   CORONARY STENT PLACEMENT  11/2006   FOOT SURGERY  2003   HERNIA REPAIR     LEFT HEART CATH AND CORONARY ANGIOGRAPHY Left 12/31/2022   Procedure: LEFT HEART CATH AND CORONARY ANGIOGRAPHY;  Surgeon: Le Juan DELENA, MD;  Location: [facility] INVASIVE CV LAB;  Service: Cardiovascular;  Laterality: Left;   POLYPECTOMY N/A 01/28/2017   Procedure: POLYPECTOMY;  Surgeon: Juan Copping, MD;  Location: [facility];  Service: Endoscopy;  Laterality: N/A;   UMBILICAL HERNIA REPAIR  04/2012    Allergies  Allergies[1]     History of Present Illness      79 y.o. y/o male with a history of CAD status post CABG x 4 and subsequent PCI, hypertension, hyperlipidemia, diabetes, COPD, sleep apnea, GERD, diastolic dysfunction, and sleep apnea.  He previously underwent CABG x 4 in 1997 and then required PCI of the vein graft to the diagonal in 2008.  It is notable that other grafts were patent at that time.  In February 2024, he was evaluated secondary to chest pain and dyspnea.  Stress testing was performed and showed inferior and inferolateral infarct as well as anterolateral ischemia.  Diagnostic catheterization was performed in March 2024 which showed severe native multivessel disease with only 1 of 4 patent grafts (only the LIMA to the LAD remained patent).  Medical therapy was recommended.  Echocardiogram in April 2024 showed an EF of 55 to 60% with grade 1 diastolic dysfunction, mild MR, and aortic sclerosis.    [name] was last in cardiology clinic in December 2025.  He was not having any chest pain but was hypertensive.  We resumed telmisartan  40 mg daily, which he had come off of for unknown reasons. Objective   Home Medications    Current Outpatient Medications  Medication Sig Dispense Refill   aspirin  81 MG tablet Take 1 tablet by mouth daily.     atorvastatin  (LIPITOR) 40 MG tablet TAKE 1 TABLET BY MOUTH  DAILY 100 tablet 2   budesonide-glycopyrrolate-formoterol (BREZTRI  AEROSPHERE) 160-9-4.8 MCG/ACT AERO inhaler Inhale 2 puffs into the lungs in the morning and at bedtime. (Patient not taking: Reported on 09/26/2024)     carvedilol  (COREG ) 6.25 MG tablet Take 1 tablet (6.25 mg total) by mouth 2 (two) times daily. 180 tablet 2   dicyclomine  (BENTYL ) 10 MG capsule Take 1 capsule (10 mg total) by mouth 4 (four) times daily -  before meals and at bedtime. (Patient not taking: Reported on 09/26/2024) 90 capsule 1   Dulaglutide  (TRULICITY ) 0.75 MG/0.5ML SOAJ Inject 0.75 mg into the skin once a week. (Patient not taking: Reported on 09/26/2024) 6 mL 1   metFORMIN  (GLUCOPHAGE ) 1000 MG tablet Take 1 tablet (1,000 mg total) by mouth 2 (two) times daily with a meal. 200 tablet 1   pantoprazole  (PROTONIX ) 40 MG tablet Take 1 tablet (40 mg total) by mouth daily. 90 tablet 1   QUEtiapine  (SEROQUEL ) 200 MG tablet TAKE 1 TABLET BY MOUTH EVERY  NIGHT AT BEDTIME 90 tablet 3   telmisartan  (MICARDIS ) 40 MG tablet Take 1 tablet (40 mg total) by mouth daily. 90 tablet 1   No current facility-administered medications for this visit.     Physical Exam    VS:  There were no vitals taken for this visit. , BMI There is no height or weight on file to calculate BMI.          GEN: Well nourished, well developed, in no acute distress. HEENT: normal. Neck: Supple, no JVD, carotid bruits, or masses. Cardiac: RRR, no murmurs, rubs, or gallops. No clubbing, cyanosis, edema.  Radials 2+/PT 2+ and equal bilaterally.  Respiratory:  Respirations regular and unlabored, clear to auscultation bilaterally. GI: Soft, nontender, nondistended, BS + x 4. MS: no deformity or atrophy. Skin: warm and dry, no rash. Neuro:  Strength and sensation are intact. Psych: Normal affect.  Accessory Clinical Findings    ECG personally reviewed by me today -    *** - no acute changes.  Lab Results  Component Value Date   WBC 6.7 08/13/2024    HGB 13.5 08/13/2024   HCT 41.3 08/13/2024   MCV 93 08/13/2024   PLT 233 08/13/2024   Lab Results  Component Value Date   CREATININE 1.10 08/13/2024   BUN 16 08/13/2024   NA 140 08/13/2024   K 3.9 08/13/2024   CL 102 08/13/2024   CO2 22 08/13/2024   Lab Results  Component Value Date   ALT 20 08/13/2024   AST 14 08/13/2024   ALKPHOS 66 08/13/2024   BILITOT 0.4 08/13/2024   Lab Results  Component Value Date   CHOL 109 08/13/2024   HDL 30 (L) 08/13/2024   LDLCALC 52 08/13/2024   TRIG 158 (H) 08/13/2024   CHOLHDL 3.6 08/13/2024    Lab Results  Component Value Date  HGBA1C 5.2 08/13/2024   Lab Results  Component Value Date   TSH 8.240 (H) 05/27/2022       Assessment & Plan    1.  ***  Lonni Meager, NP 11/09/2024, 8:01 AM     [1]  Allergies Allergen Reactions   Ace Inhibitors Cough   Diphenhydramine Rash    stomach upset   "

## 2024-11-09 NOTE — Telephone Encounter (Signed)
 Requested Prescriptions  Refused Prescriptions Disp Refills   pantoprazole  (PROTONIX ) 40 MG tablet [Pharmacy Med Name: PANTOPRAZOLE  SOD DR 40 MG TAB] 90 tablet 0    Sig: Take 1 tablet (40 mg total) by mouth daily.     Gastroenterology: Proton Pump Inhibitors Passed - 11/09/2024  2:06 PM      Passed - Valid encounter within last 12 months    Recent Outpatient Visits           2 months ago Encounter for Medicare annual wellness exam   [facility] Melvin Pao, NP   2 months ago COPD with chronic bronchitis and emphysema [facility])   [facility] Melvin Pao, NP   5 months ago Type 2 diabetes mellitus with peripheral neuropathy [facility])   [facility] Melvin Pao, NP   8 months ago COPD with chronic bronchitis and emphysema [facility])   [facility] Melvin Pao, NP   9 months ago Type 2 diabetes mellitus with peripheral neuropathy [facility])   [facility] Melvin Pao, NP

## 2024-11-19 ENCOUNTER — Ambulatory Visit: Admitting: Nurse Practitioner

## 2024-11-21 ENCOUNTER — Ambulatory Visit: Admitting: Nurse Practitioner

## 2024-11-21 NOTE — Progress Notes (Unsigned)
 "  There were no vitals taken for this visit.   Subjective:    Patient ID: Juan DELENA Fern Mickey., male    DOB: 1945/12/15, 79 y.o.   MRN: [id number]  HPI: Juan Eliot. is a 79 y.o. male  No chief complaint on file.   HYPERTENSION / HYPERLIPIDEMIA Doing well.  Denies concerns at visit today.  Satisfied with current treatment? yes Duration of hypertension: years BP monitoring frequency:no BP range:  BP medication side effects: no Past BP meds: telmisartan  and carvedilol  Duration of hyperlipidemia: years Cholesterol medication side effects: no Cholesterol supplements: none Past cholesterol medications: atorvastain (lipitor) Medication compliance: excellent compliance Aspirin : yes Recent stressors: no Recurrent headaches: no Visual changes: no Palpitations: no Dyspnea: with excretion Chest pain: no Lower extremity edema: no Dizzy/lightheaded: no   DIABETES Now taking metformin  1000mg  Trulicity  0.75mg .  Denies concerns regarding medication at visit today.  A1c has been well controlled.  Doesn't check sugars.  Hypoglycemic episodes:no Polydipsia/polyuria: no Visual disturbance: no Chest pain: no Paresthesias: yes Glucose Monitoring: no             Accucheck frequency: hasn't been checking it             Fasting glucose:             Post prandial:             Evening:             Before meals: Taking Insulin?: no             Long acting insulin:             Short acting insulin: Blood Pressure Monitoring: sometimes Retinal Examination: Up to Date Foot Exam: Up to Date Diabetic Education: Not Completed Pneumovax: Up to Date Influenza: Up to Date Aspirin : yes   COPD Now seeing Pulmonology.  On Breztri .   COPD status: controlled Satisfied with current treatment?: yes Oxygen use: no Dyspnea frequency: nonel Cough frequency:  Rescue inhaler frequency:  none Limitation of activity: yes Productive cough:  Last Spirometry: 03/04/2021 Pneumovax: Up to  Date Influenza: Up to Date   SLEEP APNEA Doesn't even have a CPAP machine.  Not interested in getting another machine or doing another sleep study.  Does not want to get CPAP. Sleep apnea status: uncontrolled Duration: chronic Satisfied with current treatment?:  no CPAP use:  no- didn't like it Sleep quality with CPAP use: good Treament compliance:poor compliance Last sleep study:  Treatments attempted: CPAP Wakes feeling refreshed:  yes Daytime hypersomnolence:  no Fatigue:  no Insomnia:  no Good sleep hygiene:  no Difficulty falling asleep:  no Difficulty staying asleep:  no Snoring bothers bed partner:  unknown Observed apnea by bed partner: no Obesity:  yes Hypertension: yes  Pulmonary hypertension:  no Coronary artery disease:  yes   NEUROPATHY Patient states his neuropathy is about the same.  He stopped taking Gabapentin  and it didn't make any difference in his pain.  ABDOMINAL ISSUES Lost 5lbs in the last month. Decreased appetite.  Duration: at least 2 weeks Nature: sharp and burning Location: epigastric  Severity: 10/10  Radiation: no Episode duration: Frequency: constant but gets more intense Alleviating factors: nothing Aggravating factors: nothing Treatments attempted: none Constipation: no Diarrhea: yes Episodes of diarrhea/day: Mucous in the stool: no Heartburn: yes Bloating:yes Flatulence: no Nausea: no Vomiting: no Episodes of vomit/day: Melena or hematochezia: no Rash: no Jaundice: no Fever: no Weight loss: no   Relevant past medical,  surgical, family and social history reviewed and updated as indicated. Interim medical history since our last visit reviewed. Allergies and medications reviewed and updated.  Review of Systems  Constitutional:  Negative for fever.  Eyes:  Negative for visual disturbance.  Respiratory:  Negative for cough, chest tightness and shortness of breath.   Cardiovascular:  Negative for chest pain, palpitations  and leg swelling.  Gastrointestinal:  Positive for abdominal distention, abdominal pain and diarrhea. Negative for anal bleeding, blood in stool, constipation, nausea, rectal pain and vomiting.  Endocrine: Negative for polydipsia and polyuria.  Neurological:  Positive for numbness. Negative for dizziness, light-headedness and headaches.    Per HPI unless specifically indicated above     Objective:    There were no vitals taken for this visit.  Wt Readings from Last 3 Encounters:  09/26/24 190 lb (86.2 kg)  08/17/24 198 lb 12.8 oz (90.2 kg)  08/13/24 199 lb 12.8 oz (90.6 kg)    Physical Exam Vitals and nursing note reviewed.  Constitutional:      General: He is not in acute distress.    Appearance: Normal appearance. He is not ill-appearing, toxic-appearing or diaphoretic.  HENT:     Head: Normocephalic.     Right Ear: External ear normal.     Left Ear: External ear normal.     Nose: Nose normal. No congestion or rhinorrhea.     Mouth/Throat:     Mouth: Mucous membranes are moist.  Eyes:     General:        Right eye: No discharge.        Left eye: No discharge.     Extraocular Movements: Extraocular movements intact.     Conjunctiva/sclera: Conjunctivae normal.     Pupils: Pupils are equal, round, and reactive to light.  Cardiovascular:     Rate and Rhythm: Normal rate and regular rhythm.     Heart sounds: No murmur heard. Pulmonary:     Effort: Pulmonary effort is normal. No respiratory distress.     Breath sounds: Normal breath sounds. No wheezing, rhonchi or rales.  Abdominal:     General: Abdomen is flat. Bowel sounds are normal.     Tenderness: There is abdominal tenderness in the right upper quadrant, epigastric area and left upper quadrant. There is guarding.     Hernia: A hernia is present. Hernia is present in the ventral area.     Comments: Large Ventral hernia on exam  Musculoskeletal:     Cervical back: Normal range of motion and neck supple.  Skin:     General: Skin is warm and dry.     Capillary Refill: Capillary refill takes less than 2 seconds.  Neurological:     General: No focal deficit present.     Mental Status: He is alert and oriented to person, place, and time.  Psychiatric:        Mood and Affect: Mood normal.        Behavior: Behavior normal.        Thought Content: Thought content normal.        Judgment: Judgment normal.     Results for orders placed or performed during the hospital encounter of 08/13/24  I-STAT creatinine   Collection Time: 08/13/24 12:18 PM  Result Value Ref Range   Creatinine, Ser 1.10 0.61 - 1.24 mg/dL      Assessment & Plan:   Problem List Items Addressed This Visit   None      Follow  up plan: No follow-ups on file.      "
